# Patient Record
Sex: Female | Born: 1975
Health system: Southern US, Community
[De-identification: ages and names within clinical notes are randomized; demographics above are authoritative.]

## PROBLEM LIST (undated history)

## (undated) DIAGNOSIS — R638 Other symptoms and signs concerning food and fluid intake: Secondary | ICD-10-CM

## (undated) DIAGNOSIS — I5022 Chronic systolic (congestive) heart failure: Principal | ICD-10-CM

## (undated) DIAGNOSIS — F32A Depression, unspecified: Secondary | ICD-10-CM

## (undated) DIAGNOSIS — Z8619 Personal history of other infectious and parasitic diseases: Secondary | ICD-10-CM

## (undated) DIAGNOSIS — G8190 Hemiplegia, unspecified affecting unspecified side: Secondary | ICD-10-CM

## (undated) DIAGNOSIS — K5900 Constipation, unspecified: Secondary | ICD-10-CM

## (undated) DIAGNOSIS — M7989 Other specified soft tissue disorders: Secondary | ICD-10-CM

## (undated) DIAGNOSIS — D649 Anemia, unspecified: Secondary | ICD-10-CM

## (undated) DIAGNOSIS — L309 Dermatitis, unspecified: Secondary | ICD-10-CM

## (undated) DIAGNOSIS — E559 Vitamin D deficiency, unspecified: Secondary | ICD-10-CM

## (undated) DIAGNOSIS — K219 Gastro-esophageal reflux disease without esophagitis: Secondary | ICD-10-CM

## (undated) DIAGNOSIS — I517 Cardiomegaly: Secondary | ICD-10-CM

## (undated) DIAGNOSIS — G43909 Migraine, unspecified, not intractable, without status migrainosus: Secondary | ICD-10-CM

## (undated) DIAGNOSIS — IMO0002 Reserved for concepts with insufficient information to code with codable children: Secondary | ICD-10-CM

## (undated) DIAGNOSIS — I639 Cerebral infarction, unspecified: Secondary | ICD-10-CM

## (undated) DIAGNOSIS — Q211 Atrial septal defect: Secondary | ICD-10-CM

## (undated) DIAGNOSIS — Z8673 Personal history of transient ischemic attack (TIA), and cerebral infarction without residual deficits: Secondary | ICD-10-CM

## (undated) HISTORY — DX: Other symptoms and signs concerning food and fluid intake: R63.8

## (undated) HISTORY — DX: Hemiplegia, unspecified affecting unspecified side: G81.90

## (undated) HISTORY — DX: Personal history of transient ischemic attack (TIA), and cerebral infarction without residual deficits: Z86.73

## (undated) HISTORY — DX: Other specified soft tissue disorders: M79.89

## (undated) HISTORY — DX: Dermatitis, unspecified: L30.9

## (undated) HISTORY — DX: Atrial septal defect: Q21.1

## (undated) HISTORY — DX: Reserved for concepts with insufficient information to code with codable children: IMO0002

## (undated) HISTORY — DX: Chronic systolic (congestive) heart failure: I50.22

## (undated) HISTORY — DX: Cerebral infarction, unspecified: I63.9

## (undated) HISTORY — DX: Personal history of other infectious and parasitic diseases: Z86.19

## (undated) HISTORY — DX: Constipation, unspecified: K59.00

---

## 1997-10-05 ENCOUNTER — Inpatient Hospital Stay (HOSPITAL_COMMUNITY): Admission: AD | Admit: 1997-10-05 | Discharge: 1997-10-05 | Payer: Self-pay | Admitting: *Deleted

## 1997-12-23 ENCOUNTER — Inpatient Hospital Stay (HOSPITAL_COMMUNITY): Admission: AD | Admit: 1997-12-23 | Discharge: 1997-12-23 | Payer: Self-pay | Admitting: *Deleted

## 1998-03-10 ENCOUNTER — Inpatient Hospital Stay (HOSPITAL_COMMUNITY): Admission: AD | Admit: 1998-03-10 | Discharge: 1998-03-10 | Payer: Self-pay | Admitting: *Deleted

## 1998-05-25 ENCOUNTER — Inpatient Hospital Stay (HOSPITAL_COMMUNITY): Admission: AD | Admit: 1998-05-25 | Discharge: 1998-05-25 | Payer: Self-pay | Admitting: *Deleted

## 1998-08-19 ENCOUNTER — Inpatient Hospital Stay (HOSPITAL_COMMUNITY): Admission: AD | Admit: 1998-08-19 | Discharge: 1998-08-19 | Payer: Self-pay | Admitting: *Deleted

## 1998-11-09 ENCOUNTER — Inpatient Hospital Stay (HOSPITAL_COMMUNITY): Admission: AD | Admit: 1998-11-09 | Discharge: 1998-11-09 | Payer: Self-pay | Admitting: *Deleted

## 1999-07-14 ENCOUNTER — Inpatient Hospital Stay (HOSPITAL_COMMUNITY): Admission: AD | Admit: 1999-07-14 | Discharge: 1999-07-14 | Payer: Self-pay | Admitting: *Deleted

## 1999-10-05 ENCOUNTER — Inpatient Hospital Stay (HOSPITAL_COMMUNITY): Admission: AD | Admit: 1999-10-05 | Discharge: 1999-10-05 | Payer: Self-pay | Admitting: *Deleted

## 1999-12-25 ENCOUNTER — Emergency Department (HOSPITAL_COMMUNITY): Admission: EM | Admit: 1999-12-25 | Discharge: 1999-12-25 | Payer: Self-pay | Admitting: Emergency Medicine

## 1999-12-26 ENCOUNTER — Encounter: Payer: Self-pay | Admitting: Emergency Medicine

## 2001-04-04 ENCOUNTER — Other Ambulatory Visit: Admission: RE | Admit: 2001-04-04 | Discharge: 2001-04-04 | Payer: Self-pay | Admitting: Family Medicine

## 2001-04-29 ENCOUNTER — Emergency Department (HOSPITAL_COMMUNITY): Admission: EM | Admit: 2001-04-29 | Discharge: 2001-04-29 | Payer: Self-pay | Admitting: Emergency Medicine

## 2001-07-10 ENCOUNTER — Encounter: Payer: Self-pay | Admitting: Gastroenterology

## 2002-05-15 HISTORY — PX: COLON SURGERY: SHX602

## 2002-06-17 ENCOUNTER — Encounter: Payer: Self-pay | Admitting: Internal Medicine

## 2002-06-17 ENCOUNTER — Inpatient Hospital Stay (HOSPITAL_COMMUNITY): Admission: EM | Admit: 2002-06-17 | Discharge: 2002-06-21 | Payer: Self-pay | Admitting: Emergency Medicine

## 2002-06-18 ENCOUNTER — Encounter: Payer: Self-pay | Admitting: Gastroenterology

## 2002-06-19 ENCOUNTER — Encounter: Payer: Self-pay | Admitting: Surgery

## 2002-06-20 ENCOUNTER — Encounter (INDEPENDENT_AMBULATORY_CARE_PROVIDER_SITE_OTHER): Payer: Self-pay | Admitting: Specialist

## 2002-06-20 ENCOUNTER — Encounter: Payer: Self-pay | Admitting: Gastroenterology

## 2003-06-25 ENCOUNTER — Other Ambulatory Visit: Admission: RE | Admit: 2003-06-25 | Discharge: 2003-06-25 | Payer: Self-pay | Admitting: Family Medicine

## 2004-10-05 ENCOUNTER — Ambulatory Visit: Payer: Self-pay | Admitting: Gastroenterology

## 2004-10-21 ENCOUNTER — Ambulatory Visit: Payer: Self-pay | Admitting: Gastroenterology

## 2004-10-24 ENCOUNTER — Other Ambulatory Visit: Admission: RE | Admit: 2004-10-24 | Discharge: 2004-10-24 | Payer: Self-pay | Admitting: Obstetrics and Gynecology

## 2004-10-28 ENCOUNTER — Ambulatory Visit (HOSPITAL_COMMUNITY): Admission: RE | Admit: 2004-10-28 | Discharge: 2004-10-28 | Payer: Self-pay | Admitting: Gastroenterology

## 2004-12-13 ENCOUNTER — Ambulatory Visit: Payer: Self-pay | Admitting: Gastroenterology

## 2005-11-09 ENCOUNTER — Ambulatory Visit: Payer: Self-pay | Admitting: Gastroenterology

## 2005-11-28 ENCOUNTER — Emergency Department (HOSPITAL_COMMUNITY): Admission: EM | Admit: 2005-11-28 | Discharge: 2005-11-28 | Payer: Self-pay | Admitting: Emergency Medicine

## 2007-04-03 ENCOUNTER — Emergency Department (HOSPITAL_COMMUNITY): Admission: EM | Admit: 2007-04-03 | Discharge: 2007-04-03 | Payer: Self-pay | Admitting: Family Medicine

## 2007-08-28 ENCOUNTER — Emergency Department (HOSPITAL_COMMUNITY): Admission: EM | Admit: 2007-08-28 | Discharge: 2007-08-28 | Payer: Self-pay | Admitting: Family Medicine

## 2007-09-04 ENCOUNTER — Encounter: Admission: RE | Admit: 2007-09-04 | Discharge: 2007-09-04 | Payer: Self-pay | Admitting: Gastroenterology

## 2007-12-14 ENCOUNTER — Emergency Department (HOSPITAL_COMMUNITY): Admission: EM | Admit: 2007-12-14 | Discharge: 2007-12-14 | Payer: Self-pay | Admitting: Family Medicine

## 2008-03-09 DIAGNOSIS — K5900 Constipation, unspecified: Secondary | ICD-10-CM | POA: Insufficient documentation

## 2008-03-09 DIAGNOSIS — K589 Irritable bowel syndrome without diarrhea: Secondary | ICD-10-CM | POA: Insufficient documentation

## 2008-03-10 ENCOUNTER — Ambulatory Visit: Payer: Self-pay | Admitting: Gastroenterology

## 2008-03-25 ENCOUNTER — Telehealth: Payer: Self-pay | Admitting: Gastroenterology

## 2008-12-17 DIAGNOSIS — R638 Other symptoms and signs concerning food and fluid intake: Secondary | ICD-10-CM

## 2008-12-17 HISTORY — DX: Other symptoms and signs concerning food and fluid intake: R63.8

## 2009-12-26 ENCOUNTER — Emergency Department (HOSPITAL_COMMUNITY): Admission: EM | Admit: 2009-12-26 | Discharge: 2009-12-26 | Payer: Self-pay | Admitting: Emergency Medicine

## 2010-05-20 ENCOUNTER — Emergency Department (HOSPITAL_COMMUNITY)
Admission: EM | Admit: 2010-05-20 | Discharge: 2010-05-20 | Payer: Self-pay | Source: Home / Self Care | Admitting: Emergency Medicine

## 2010-07-29 LAB — URINALYSIS, ROUTINE W REFLEX MICROSCOPIC
Glucose, UA: NEGATIVE mg/dL
Ketones, ur: 15 mg/dL — AB
Nitrite: NEGATIVE
Protein, ur: 100 mg/dL — AB
Specific Gravity, Urine: 1.03 (ref 1.005–1.030)
Urobilinogen, UA: 0.2 mg/dL (ref 0.0–1.0)
pH: 5.5 (ref 5.0–8.0)

## 2010-07-29 LAB — URINE MICROSCOPIC-ADD ON

## 2010-07-29 LAB — POCT I-STAT, CHEM 8
BUN: 9 mg/dL (ref 6–23)
Chloride: 111 mEq/L (ref 96–112)
Creatinine, Ser: 0.7 mg/dL (ref 0.4–1.2)
Glucose, Bld: 97 mg/dL (ref 70–99)
HCT: 41 % (ref 36.0–46.0)
Potassium: 4.2 mEq/L (ref 3.5–5.1)

## 2010-07-29 LAB — PREGNANCY, URINE: Preg Test, Ur: NEGATIVE

## 2010-09-30 NOTE — Op Note (Signed)
NAME:  Rebecca Russo, Rebecca Russo                          ACCOUNT NO.:  [id number]   MEDICAL RECORD NO.:  [id number]                   PATIENT TYPE:  INP   LOCATION:  5713                                 FACILITY:  [facility]   PHYSICIAN:  Ollen Gross. Vernell Morgans, M.D.              DATE OF BIRTH:  07/04/1975   DATE OF PROCEDURE:  DATE OF DISCHARGE:  06/21/2002                                 OPERATIVE REPORT   PREOPERATIVE DIAGNOSIS:  Cholelithiasis.   POSTOPERATIVE DIAGNOSIS:  Cholelithiasis.   PROCEDURE:  Laparoscopic cholecystectomy.   SURGEON:  Ollen Gross. Carolynne Edouard, M.D.   ASSISTANT:  Jimmye Norman, M.D.   ANESTHESIA:  General endotracheal.   DESCRIPTION OF PROCEDURE:  After informed consent was obtained, the patient  was brought to the operating room and placed in the supine position on the  operating table.  After adequate induction of general anesthesia, the  patient's abdomen was prepped with Betadine and draped in the usual sterile  manner.  The area below the umbilicus was infiltrated with 0.25% Marcaine.  A small incision was made with the 15 blade knife.  This was incision was  carried down through the subcutaneous tissue bluntly using the Kelly clamp  and the [employer] retractors until the linea of alba was identified.  The  linea of alba was incised with a 15 blade knife, and each side was grasped  with Kocher clamps and elevated anteriorly.  The preperitoneal space was  probe bluntly with the hemostat until the peritoneum was opened and access  was gained to the abdominal cavity.  A 0 Vicryl pursestring stitch was  placed in the fascia surrounding this opening.  The Hasson cannula was  placed through the opening and anchored in place through the previously  placed Vicryl pursestring stitch.  The abdomen was then insufflated with  carbon dioxide without difficulty.  The patient was placed in a head up  position and laparoscope was inserted through the Hasson cannula.  The dome  of the  gallbladder and liver edge were readily identified.  The epigastric  region was then infiltrated with 0.25% Marcaine and a small incision was  made with the 15 blade knife.  A 10 mm port was placed bluntly through this  incision into the abdominal cavity under direct vision.  Next, sites were  chosen laterally on the right side of the abdomen for the placement of the 5  mm ports.  Each of these areas was infiltrated with 0.25% Marcaine.  Small  stab incisions were made with the 15 blade knife and the 5 mm ports were  placed bluntly through these incisions into the abdominal cavity under  direct vision.  A blunt grasper was placed through the lateral most 5 mm  port and used to grasp the dome of the gallbladder and elevate it anteriorly  and superiorly.  Another blunt grasper was placed through the other 5 mm  port and used to retract the body and neck of the gallbladder.  The  dissector was placed through the epigastric port and using the  electrocautery the peritoneal reflection at the gallbladder neck was opened  and blunt dissection was then carried out in this area until the gallbladder  neck, cystic neck junction was readily identified.  Once this was  accomplished, a clip was placed on the gallbladder neck.  A small ductotomy  was made with the laparoscopic scissors.  A 14 gauge angiocatheter was then  placed percutaneously through the anterior abdominal wall under direct  vision.  A Reddick cholangiogram catheter was then placed through the NG  catheter and flushed.  The catheter was then placed within the cystic duct  and anchored in place with the clip.  The cholangiogram was obtained that  showed good emptying of the dye into the duodenum without any filling  defects in the bile ducts and a good link on the cystic duct.  The anchor  clip and catheters were then removed from the patient.  Three clips were  placed on the cystic duct, and the duct was divided between the two sets of   clips.  The cystic artery was identified and again dissected  circumferentially in a blunt manner until a good window was created.  Two  clips were placed proximally and one distally on the artery, and the artery  was divided between the two.  Next, a laparoscopic hook cautery device was  used to separate the gallbladder from the liver bed.  Prior to completely  detaching the gallbladder from the liver bed, the liver bed was inspected  and several small bleeding points were coagulated with the electrocautery  until the liver bed was hemostatic.  The gallbladder was then detached the  rest of the way from the liver bed without difficulty.  The laparoscope was  then moved to the upper midline port and the gallbladder grasper was placed  through the Hasson cannula and used to grasp the neck of the gallbladder.  The gallbladder was then removed with the Hasson cannula through the  infraumbilical port without difficulty.  The fascial defect was then closed  with the previously placed Vicryl pursestring stitch.  The liver bed was  inspected again and found to be hemostatic.  The abdomen was then irrigated  with copious amounts of saline to the effluent was clear.  The rest of the  ports were removed under direct vision and were all hemostatic.  The gas was  allowed to escape, and the skin incisions were all closed with interrupted 4-  0 Monocryl subcuticular stitches.  Benzoin and Steri-Strips were applied.  The patient tolerated the procedure well.  At the end of the case, all  needle, sponge and instrument counts were correct.  The patient was awakened  and taken to the recovery room in stable condition.                                                Ollen Gross. Vernell Morgans, M.D.    PST/MEDQ  D:  06/22/2002  T:  06/22/2002  Job:  [id number]

## 2010-09-30 NOTE — Consult Note (Signed)
   NAME:  Rebecca Russo, Rebecca Russo                          ACCOUNT NO.:  [id number]   MEDICAL RECORD NO.:  [id number]                   PATIENT TYPE:  INP   LOCATION:  1829                                 FACILITY:  [facility]   PHYSICIAN:  Thornton Park. Daphine Deutscher, M.D.             DATE OF BIRTH:  1976/02/26   DATE OF CONSULTATION:  06/17/2002  DATE OF DISCHARGE:                                   CONSULTATION   ADMISSION DIAGNOSIS:  Gallstones, probable choledocholithiasis.   HISTORY OF PRESENT ILLNESS:  This is a 35 year old, slightly obese, black  female with a history of chronic abdominal pain.  She has had C-section  several months ago and since then has had some bouts of abdominal pain  similarly, but none has lasted quite like this.  Her pain started yesterday  and has lasted through today.  She is seen in the ED, where her bilirubin  was noted to be over 3, and she had elevated transaminases.  Total bilirubin  was 3.8.   I reviewed her ultrasound with Dr. Jena Gauss and found her to have multiple  moderate-sized gallstones.  Her common bile duct was noted to be elevated at  1.1 cm and it appeared to be consistent with choledocholithiasis.  She did  have any evidence of pericholecystic fluid.   ALLERGIES:  She has no known allergies.   MEDICATIONS:  Depo-Provera every three months.   PHYSICAL EXAMINATION:  GENERAL:  Somewhat moderately obese female who is  afebrile.  VITAL SIGNS:  Stable.  HEENT:  No obvious scleral icterus at this time.  ABDOMEN:  Some mild tenderness in the right upper quadrant without rebound  or guarding.  LUNGS:  Clear.  HEART:  Sinus rhythm.   IMPRESSION:  Gallstones with elevated and dilated common bile duct.   PLAN:  ERCP on June 18, 2002, with probable laparoscopic cholecystectomy  to follow.                                                   Thornton Park Daphine Deutscher, M.D.    MBM/MEDQ  D:  06/17/2002  T:  06/17/2002  Job:  [id number]

## 2010-09-30 NOTE — Discharge Summary (Signed)
   NAME:  ANIQUA, BRIERE                          ACCOUNT NO.:  [id number]   MEDICAL RECORD NO.:  [id number]                   PATIENT TYPE:  INP   LOCATION:  5713                                 FACILITY:  [facility]   PHYSICIAN:  Ollen Gross. Vernell Morgans, M.D.              DATE OF BIRTH:  20-Jan-1976   DATE OF ADMISSION:  06/17/2002  DATE OF DISCHARGE:  06/21/2002                                 DISCHARGE SUMMARY   HISTORY OF PRESENT ILLNESS:  The patient is a 35 year old white female who  was admitted to the hospital with symptomatic gallstones.   HOSPITAL COURSE:  She underwent an ERCP on June 18, 2002. She was  improved on June 19, 2002, and on  June 20, 2002, she was to the OR  for a laparoscopic cholecystectomy.   She tolerated this well, and  the following day her liver function tests  were continuing to improve. She was tolerating a diet and she was ready for  discharge to home.   FINAL DIAGNOSES:  Cholelithiasis.   CONDITION ON DISCHARGE:  Stable.   DISCHARGE INSTRUCTIONS:  Diet as tolerated.   DISCHARGE MEDICATIONS:  She was to resume her home medications and she was  given a prescription for Vicodin for pain.   DISPOSITION:  She was discharged to home.                                               Ollen Gross. Vernell Morgans, M.D.    PST/MEDQ  D:  08/19/2002  T:  08/20/2002  Job:  [id number]

## 2010-09-30 NOTE — H&P (Signed)
NAME:  Rebecca Russo, Rebecca Russo                          ACCOUNT NO.:  [id number]   MEDICAL RECORD NO.:  [id number]                   PATIENT TYPE:  INP   LOCATION:  1829                                 FACILITY:  [facility]   PHYSICIAN:  Barbette Hair. Arlyce Dice, M.D. [facility]          DATE OF BIRTH:  07-14-1975   DATE OF ADMISSION:  06/17/2002  DATE OF DISCHARGE:                                HISTORY & PHYSICAL   CHIEF COMPLAINT:  Right upper quadrant pain, nausea and vomiting.   HISTORY OF PRESENT ILLNESS:  The patient is a 35 year old African-American  female who has a history of chronic intermittent diffuse abdominal  pain.  She underwent colonoscopy last year by Dr. Eloise Harman.  Do not have the  results of the colonoscopy, but she does carry a diagnosis of irritable  bowel syndrome, constipation prone.  She was seen in the ER today by the  emergency room staff because of onset of new right upper quadrant pain  associated with nausea and vomiting yesterday afternoon.  This has persisted  through today.  She came to the emergency room.  Labs showed elevated total  bilirubin, transaminases and alk phos.  Ultrasound shows extensive  gallstones and dilated common bile duct.  The patient has not had prior  abdominal  ultrasound.   PAST MEDICAL HISTORY:  Childbirth x2.  Irritable bowel syndrome.   SOCIAL HISTORY:  The patient works as a Government social research officer.  She has  two children.  She is unmarried.  Does not smoke.  Does not consume  alcoholic beverages.   FAMILY HISTORY:  One of her aunts has had open cholecystectomy.   CURRENT MEDICATIONS:  Depo-Provera shots every three months.   ALLERGIES:  None.   REVIEW OF SYSTEMS:  NEUROLOGIC:  No headaches, no paresthesias.  No syncope.  PULMONARY:  No shortness of breath, no cough, no pleuritic pain.  CARDIOVASCULAR:  No palpitations, no extremity edema.  GENERAL:  Appetite  has been decreased in the last six weeks with early satiety.  However, no  nausea  until last night.  She denies weight loss.  No fevers, no chills. GI:  As above.  GU:  No dysuria.  No hematuria.  No bladder incontinence.  MUSCULOSKELETAL:  No back pain.  No joint effusions.  All other systems  reviewed and were negative.   PHYSICAL EXAMINATION:  GENERAL:  The patient is a morbidly obese African-  American female.  Affect is flat.  VITAL SIGNS:  Blood pressure 114/56, pulse 74, respirations 20, temperature  99.2.  Weight not yet obtained.  HEENT:  No scleral icterus.  No conjunctival pallor. Oropharynx is moist and  clear with dentition in fair  repair.  CHEST:  Clear to auscultation and percussion bilaterally.  NECK:  There is no masses, no jugular venous distention, no thyromegaly.  COR:  Regular rate and rhythm.  No murmurs, rubs or gallops.  ABDOMEN:  Obese.  She has mild tenderness in the right upper quadrant not  associated with guarding or rebound.  Bowel sounds are active.  No  hepatosplenomegaly.  No masses.  RECTAL:  Deferred.  GU:  Deferred.  BREASTS:  Deferred.  NEUROLOGIC:  She is alert and oriented x3.  No tremors.  PSYCHIATRIC:  Affect is flat and she is a bit tearful.  EXTREMITIES:  No cyanosis, clubbing or edema.  DERMATOLOGIC:  A tattoo is noted at the base of the right neck.   LABORATORY DATA:  Total bilirubin 3.8, alkaline phosphatase 143, AST 401,  ALT 409.  Albumin is 3.7.  Sodium 142, potassium 4.0.  BUN is 9, creatinine  0.9.  Glucose 110.  White blood cell count 9.7.  Hemoglobin 12.4, hematocrit  35.9, platelets 384,000 and MCV is 88.  Ultrasound showing extensive  gallstones and dilated common bile duct.  Urinalysis is negative.   ASSESSMENT:  1. Rule out choledocholithiasis in patient  with acute onset right upper     quadrant pain with nausea, vomiting and cholestatic jaundice pattern on     liver function tests.  2. Gallstones.   PLAN:  The patient to be admitted to Dr. Marzetta Board service.  Plan ERCP  tomorrow.  Dr. Daphine Deutscher has been  requested to see the patient for general  surgical evaluation and consideration of laparoscopic cholecystectomy.     Brett Canales, P.A. [facility]                    Robert D. Arlyce Dice, M.D. [facility]    SG/MEDQ  D:  06/17/2002  T:  06/17/2002  Job:  [id number]

## 2011-02-10 LAB — COMPREHENSIVE METABOLIC PANEL
ALT: 30
Alkaline Phosphatase: 78
Chloride: 109
Glucose, Bld: 97
Potassium: 3.7
Sodium: 139
Total Protein: 7.5

## 2011-02-10 LAB — POCT I-STAT, CHEM 8
Creatinine, Ser: 0.8
Glucose, Bld: 90
Hemoglobin: 13.9
Sodium: 139
TCO2: 22

## 2011-02-21 LAB — POCT URINALYSIS DIP (DEVICE)
Protein, ur: 30 — AB
Specific Gravity, Urine: 1.025
pH: 6

## 2011-02-21 LAB — URINE CULTURE

## 2011-02-21 LAB — POCT PREGNANCY, URINE: Operator id: 208841

## 2011-11-24 ENCOUNTER — Ambulatory Visit (INDEPENDENT_AMBULATORY_CARE_PROVIDER_SITE_OTHER): Payer: Private Health Insurance - Indemnity | Admitting: Obstetrics and Gynecology

## 2011-11-24 ENCOUNTER — Encounter: Payer: Self-pay | Admitting: Obstetrics and Gynecology

## 2011-11-24 VITALS — BP 110/80 | Resp 14 | Ht 70.5 in | Wt 346.0 lb

## 2011-11-24 DIAGNOSIS — Z30432 Encounter for removal of intrauterine contraceptive device: Secondary | ICD-10-CM

## 2011-11-24 NOTE — Progress Notes (Signed)
Pt here for IUD removal.  Says she feels pressure and discomfort since insertion but has not had a cycle.  Filed Vitals:   11/24/11 0936  BP: 110/80  Resp: 14   ROS: noncontributory  Pelvic exam:  VULVA: normal appearing vulva with no masses, tenderness or lesions,  VAGINA: normal appearing vagina with normal color and discharge, no lesions, CERVIX: normal appearing cervix without discharge or lesions,  UTERUS: uterus is normal size, shape, consistency and nontender, difficult secondary to habitus ADNEXA: normal adnexa in size, nontender and no masses. IUD removed without difficulty  A/P [facility] options reviewed - rec prog only agents secondary to age and wt - Pt is deciding Next month for AEX

## 2012-02-09 ENCOUNTER — Encounter: Payer: Private Health Insurance - Indemnity | Admitting: Obstetrics and Gynecology

## 2012-06-11 ENCOUNTER — Encounter: Payer: Private Health Insurance - Indemnity | Admitting: Obstetrics and Gynecology

## 2012-06-17 ENCOUNTER — Ambulatory Visit: Payer: Private Health Insurance - Indemnity | Admitting: Obstetrics and Gynecology

## 2012-06-17 ENCOUNTER — Encounter: Payer: Self-pay | Admitting: Obstetrics and Gynecology

## 2012-06-17 VITALS — BP 112/70 | Ht 70.0 in | Wt 345.0 lb

## 2012-06-17 DIAGNOSIS — Z30017 Encounter for initial prescription of implantable subdermal contraceptive: Secondary | ICD-10-CM

## 2012-06-17 DIAGNOSIS — Z3040 Encounter for surveillance of contraceptives, unspecified: Secondary | ICD-10-CM

## 2012-06-17 LAB — POCT URINE PREGNANCY: Preg Test, Ur: NEGATIVE

## 2012-06-17 MED ORDER — ETONOGESTREL 68 MG ~~LOC~~ IMPL: 68.0000 mg | DRUG_IMPLANT | Freq: Once | SUBCUTANEOUS | Status: DC

## 2012-06-17 MED ORDER — ETONOGESTREL 68 MG ~~LOC~~ IMPL
68.0000 mg | DRUG_IMPLANT | Freq: Once | SUBCUTANEOUS | Status: AC
  Administered 2012-06-17: 68 mg via SUBCUTANEOUS

## 2012-06-17 NOTE — Progress Notes (Deleted)
Subjective:     Patient ID: Rebecca Russo, female   DOB: 19-Jun-1975, 37 y.o.   MRN: [id number]  HPI   Review of Systems     Objective:   Physical Exam     Assessment:     ***    Plan:     ***

## 2012-06-17 NOTE — Progress Notes (Deleted)
Subjective:     Patient ID: Rebecca Russo, female   DOB: 03/21/1976, 36 y.o.   MRN: [id number]  HPI   Review of Systems     Objective:   Physical Exam     Assessment:     ***    Plan:     ***      

## 2012-06-17 NOTE — Progress Notes (Addendum)
Patient ID: Rebecca Russo, female   DOB: Jun 22, 1975, 37 y.o.   MRN: [id number] LMP: 05/31/2012 INSERTION DATE: 06/17/2012 REMOVAL DATE: 06/18/2015 INSERTION ARM: Left PALPATED AFTER INSERT: yes IS PT SWITCHING FROM HORMONAL BC: no LOT # [id number] EXP: 08/2014  Reviewed SEs and questions answered  Inserted per protocol in left arm without difficulty  Filed Vitals:   06/17/12 1143  BP: 112/70   A/P rto 1-2wks for f/u

## 2012-06-20 HISTORY — PX: LAPAROSCOPIC CHOLECYSTECTOMY: SUR755

## 2012-06-29 ENCOUNTER — Other Ambulatory Visit: Payer: Self-pay

## 2012-07-01 ENCOUNTER — Encounter: Payer: Private Health Insurance - Indemnity | Admitting: Obstetrics and Gynecology

## 2013-03-20 ENCOUNTER — Other Ambulatory Visit: Payer: Self-pay

## 2014-03-16 ENCOUNTER — Encounter: Payer: Self-pay | Admitting: Obstetrics and Gynecology

## 2014-05-29 ENCOUNTER — Inpatient Hospital Stay (HOSPITAL_COMMUNITY): Payer: Managed Care, Other (non HMO)

## 2014-05-29 ENCOUNTER — Inpatient Hospital Stay (HOSPITAL_COMMUNITY)
Admission: EM | Admit: 2014-05-29 | Discharge: 2014-06-04 | DRG: 023 | Disposition: A | Discharge status: Home Health | Payer: Managed Care, Other (non HMO) | Attending: Neurology | Admitting: Neurology

## 2014-05-29 ENCOUNTER — Encounter (HOSPITAL_COMMUNITY)
Admission: EM | Disposition: A | Discharge status: Home Health | Payer: Self-pay | Source: Home / Self Care | Attending: Neurology

## 2014-05-29 ENCOUNTER — Ambulatory Visit (HOSPITAL_COMMUNITY): Payer: Managed Care, Other (non HMO)

## 2014-05-29 ENCOUNTER — Encounter (HOSPITAL_COMMUNITY): Payer: Self-pay | Admitting: Certified Registered"

## 2014-05-29 ENCOUNTER — Emergency Department (HOSPITAL_COMMUNITY): Payer: Managed Care, Other (non HMO) | Admitting: Anesthesiology

## 2014-05-29 ENCOUNTER — Emergency Department (HOSPITAL_COMMUNITY): Payer: Managed Care, Other (non HMO)

## 2014-05-29 DIAGNOSIS — I63412 Cerebral infarction due to embolism of left middle cerebral artery: Principal | ICD-10-CM | POA: Diagnosis present

## 2014-05-29 DIAGNOSIS — R1310 Dysphagia, unspecified: Secondary | ICD-10-CM | POA: Diagnosis present

## 2014-05-29 DIAGNOSIS — R4701 Aphasia: Secondary | ICD-10-CM | POA: Diagnosis present

## 2014-05-29 DIAGNOSIS — G8191 Hemiplegia, unspecified affecting right dominant side: Secondary | ICD-10-CM | POA: Diagnosis present

## 2014-05-29 DIAGNOSIS — Z823 Family history of stroke: Secondary | ICD-10-CM

## 2014-05-29 DIAGNOSIS — R197 Diarrhea, unspecified: Secondary | ICD-10-CM | POA: Diagnosis present

## 2014-05-29 DIAGNOSIS — IMO0002 Reserved for concepts with insufficient information to code with codable children: Secondary | ICD-10-CM

## 2014-05-29 DIAGNOSIS — E785 Hyperlipidemia, unspecified: Secondary | ICD-10-CM | POA: Diagnosis present

## 2014-05-29 DIAGNOSIS — Z8249 Family history of ischemic heart disease and other diseases of the circulatory system: Secondary | ICD-10-CM | POA: Diagnosis not present

## 2014-05-29 DIAGNOSIS — E669 Obesity, unspecified: Secondary | ICD-10-CM | POA: Diagnosis present

## 2014-05-29 DIAGNOSIS — I10 Essential (primary) hypertension: Secondary | ICD-10-CM | POA: Diagnosis present

## 2014-05-29 DIAGNOSIS — W06XXXA Fall from bed, initial encounter: Secondary | ICD-10-CM | POA: Diagnosis present

## 2014-05-29 DIAGNOSIS — J9601 Acute respiratory failure with hypoxia: Secondary | ICD-10-CM | POA: Diagnosis not present

## 2014-05-29 DIAGNOSIS — I639 Cerebral infarction, unspecified: Secondary | ICD-10-CM | POA: Diagnosis present

## 2014-05-29 DIAGNOSIS — I6782 Cerebral ischemia: Secondary | ICD-10-CM

## 2014-05-29 DIAGNOSIS — D72829 Elevated white blood cell count, unspecified: Secondary | ICD-10-CM | POA: Diagnosis not present

## 2014-05-29 DIAGNOSIS — I6522 Occlusion and stenosis of left carotid artery: Secondary | ICD-10-CM | POA: Diagnosis present

## 2014-05-29 DIAGNOSIS — Z6841 Body Mass Index (BMI) 40.0 and over, adult: Secondary | ICD-10-CM

## 2014-05-29 DIAGNOSIS — G8190 Hemiplegia, unspecified affecting unspecified side: Secondary | ICD-10-CM

## 2014-05-29 DIAGNOSIS — R1314 Dysphagia, pharyngoesophageal phase: Secondary | ICD-10-CM | POA: Diagnosis present

## 2014-05-29 DIAGNOSIS — Z9289 Personal history of other medical treatment: Secondary | ICD-10-CM

## 2014-05-29 DIAGNOSIS — J9600 Acute respiratory failure, unspecified whether with hypoxia or hypercapnia: Secondary | ICD-10-CM

## 2014-05-29 DIAGNOSIS — I253 Aneurysm of heart: Secondary | ICD-10-CM | POA: Diagnosis present

## 2014-05-29 DIAGNOSIS — Z4659 Encounter for fitting and adjustment of other gastrointestinal appliance and device: Secondary | ICD-10-CM

## 2014-05-29 DIAGNOSIS — I69320 Aphasia following cerebral infarction: Secondary | ICD-10-CM

## 2014-05-29 DIAGNOSIS — Q211 Atrial septal defect: Secondary | ICD-10-CM | POA: Diagnosis not present

## 2014-05-29 HISTORY — PX: RADIOLOGY WITH ANESTHESIA: SHX6223

## 2014-05-29 LAB — I-STAT CHEM 8, ED
BUN: 11 mg/dL (ref 6–23)
CREATININE: 0.7 mg/dL (ref 0.50–1.10)
Calcium, Ion: 1.19 mmol/L (ref 1.12–1.23)
Chloride: 107 mEq/L (ref 96–112)
GLUCOSE: 113 mg/dL — AB (ref 70–99)
HCT: 38 % (ref 36.0–46.0)
Hemoglobin: 12.9 g/dL (ref 12.0–15.0)
Potassium: 4 mmol/L (ref 3.5–5.1)
SODIUM: 139 mmol/L (ref 135–145)
TCO2: 19 mmol/L (ref 0–100)

## 2014-05-29 LAB — COMPREHENSIVE METABOLIC PANEL
ALT: 22 U/L (ref 0–35)
ANION GAP: 11 (ref 5–15)
AST: 29 U/L (ref 0–37)
Albumin: 3.5 g/dL (ref 3.5–5.2)
Alkaline Phosphatase: 73 U/L (ref 39–117)
BUN: 10 mg/dL (ref 6–23)
CHLORIDE: 107 meq/L (ref 96–112)
CO2: 21 mmol/L (ref 19–32)
CREATININE: 0.76 mg/dL (ref 0.50–1.10)
Calcium: 9.3 mg/dL (ref 8.4–10.5)
GFR calc Af Amer: >90 mL/min (ref >90)
GFR calc non Af Amer: >90 mL/min (ref >90)
Glucose, Bld: 114 mg/dL — ABNORMAL HIGH (ref 70–99)
Potassium: 4 mmol/L (ref 3.5–5.1)
SODIUM: 139 mmol/L (ref 135–145)
Total Bilirubin: 0.4 mg/dL (ref 0.3–1.2)
Total Protein: 7.2 g/dL (ref 6.0–8.3)

## 2014-05-29 LAB — I-STAT ARTERIAL BLOOD GAS, ED
ACID-BASE DEFICIT: 4 mmol/L — AB (ref 0.0–2.0)
Bicarbonate: 22.2 mEq/L (ref 20.0–24.0)
O2 Saturation: 100 %
PCO2 ART: 43.2 mmHg (ref 35.0–45.0)
PH ART: 7.32 — AB (ref 7.350–7.450)
PO2 ART: 360 mmHg — AB (ref 80.0–100.0)
TCO2: 24 mmol/L (ref 0–100)

## 2014-05-29 LAB — GLUCOSE, CAPILLARY
GLUCOSE-CAPILLARY: 105 mg/dL — AB (ref 70–99)
GLUCOSE-CAPILLARY: 83 mg/dL (ref 70–99)
Glucose-Capillary: 91 mg/dL (ref 70–99)

## 2014-05-29 LAB — URINALYSIS, ROUTINE W REFLEX MICROSCOPIC
Bilirubin Urine: NEGATIVE
Glucose, UA: NEGATIVE mg/dL
HGB URINE DIPSTICK: NEGATIVE
Ketones, ur: NEGATIVE mg/dL
Leukocytes, UA: NEGATIVE
Nitrite: NEGATIVE
Protein, ur: NEGATIVE mg/dL
Specific Gravity, Urine: 1.029 (ref 1.005–1.030)
UROBILINOGEN UA: 0.2 mg/dL (ref 0.0–1.0)
pH: 5 (ref 5.0–8.0)

## 2014-05-29 LAB — CBC
HCT: 35.6 % — ABNORMAL LOW (ref 36.0–46.0)
HEMOGLOBIN: 11.9 g/dL — AB (ref 12.0–15.0)
MCH: 29.6 pg (ref 26.0–34.0)
MCHC: 33.4 g/dL (ref 30.0–36.0)
MCV: 88.6 fL (ref 78.0–100.0)
PLATELETS: 385 10*3/uL (ref 150–400)
RBC: 4.02 MIL/uL (ref 3.87–5.11)
RDW: 13.8 % (ref 11.5–15.5)
WBC: 10.6 10*3/uL — AB (ref 4.0–10.5)

## 2014-05-29 LAB — ETHANOL

## 2014-05-29 LAB — ANTITHROMBIN III: AntiThromb III Func: 105 % (ref 75–120)

## 2014-05-29 LAB — POCT ACTIVATED CLOTTING TIME: ACTIVATED CLOTTING TIME: 146 s

## 2014-05-29 LAB — RAPID URINE DRUG SCREEN, HOSP PERFORMED
AMPHETAMINES: NOT DETECTED
Barbiturates: NOT DETECTED
Benzodiazepines: NOT DETECTED
Cocaine: NOT DETECTED
OPIATES: NOT DETECTED
TETRAHYDROCANNABINOL: NOT DETECTED

## 2014-05-29 LAB — APTT: aPTT: 29 seconds (ref 24–37)

## 2014-05-29 LAB — PROTIME-INR
INR: 1.02 (ref 0.00–1.49)
Prothrombin Time: 13.5 seconds (ref 11.6–15.2)

## 2014-05-29 LAB — DIFFERENTIAL
BASOS ABS: 0 10*3/uL (ref 0.0–0.1)
Basophils Relative: 0 % (ref 0–1)
Eosinophils Absolute: 0.1 10*3/uL (ref 0.0–0.7)
Eosinophils Relative: 1 % (ref 0–5)
LYMPHS PCT: 39 % (ref 12–46)
Lymphs Abs: 4.1 10*3/uL — ABNORMAL HIGH (ref 0.7–4.0)
Monocytes Absolute: 0.5 10*3/uL (ref 0.1–1.0)
Monocytes Relative: 5 % (ref 3–12)
NEUTROS ABS: 5.7 10*3/uL (ref 1.7–7.7)
NEUTROS PCT: 55 % (ref 43–77)

## 2014-05-29 LAB — I-STAT TROPONIN, ED: Troponin i, poc: 0 ng/mL (ref 0.00–0.08)

## 2014-05-29 LAB — MRSA PCR SCREENING: MRSA by PCR: NEGATIVE

## 2014-05-29 SURGERY — RADIOLOGY WITH ANESTHESIA
Anesthesia: General

## 2014-05-29 MED ORDER — SENNOSIDES-DOCUSATE SODIUM 8.6-50 MG PO TABS: 1.0000 | ORAL_TABLET | Freq: Every evening | ORAL | Status: DC | PRN

## 2014-05-29 MED ORDER — ENOXAPARIN SODIUM 40 MG/0.4ML ~~LOC~~ SOLN: 40.0000 mg | SUBCUTANEOUS | Status: DC

## 2014-05-29 MED ORDER — HEPARIN SODIUM (PORCINE) 1000 UNIT/ML IJ SOLN
INTRAMUSCULAR | Status: DC | PRN
  Administered 2014-05-29: 3000 [IU] via INTRAVENOUS

## 2014-05-29 MED ORDER — STROKE: EARLY STAGES OF RECOVERY BOOK
Freq: Once | Status: DC
  Filled 2014-05-29: qty 1

## 2014-05-29 MED ORDER — PANTOPRAZOLE SODIUM 40 MG IV SOLR
40.0000 mg | INTRAVENOUS | Status: DC
  Administered 2014-05-29 – 2014-05-30 (×2): 40 mg via INTRAVENOUS
  Filled 2014-05-29 (×3): qty 40

## 2014-05-29 MED ORDER — FENTANYL CITRATE 0.05 MG/ML IJ SOLN
INTRAMUSCULAR | Status: DC | PRN
  Administered 2014-05-29: 100 ug via INTRAVENOUS
  Administered 2014-05-29 (×3): 50 ug via INTRAVENOUS

## 2014-05-29 MED ORDER — SUCCINYLCHOLINE CHLORIDE 20 MG/ML IJ SOLN
INTRAMUSCULAR | Status: DC | PRN
  Administered 2014-05-29: 140 mg via INTRAVENOUS

## 2014-05-29 MED ORDER — CEFAZOLIN SODIUM-DEXTROSE 2-3 GM-% IV SOLR
2.0000 g | Freq: Three times a day (TID) | INTRAVENOUS | Status: DC
  Administered 2014-05-29 – 2014-05-31 (×6): 2 g via INTRAVENOUS
  Filled 2014-05-29 (×10): qty 50

## 2014-05-29 MED ORDER — NITROGLYCERIN 1 MG/10 ML FOR IR/CATH LAB
INTRA_ARTERIAL | Status: AC
  Filled 2014-05-29: qty 10

## 2014-05-29 MED ORDER — DEXTROSE 5 % IV SOLN
10.0000 mg | INTRAVENOUS | Status: DC | PRN
  Administered 2014-05-29: 20 ug/min via INTRAVENOUS

## 2014-05-29 MED ORDER — HEPARIN SODIUM (PORCINE) 1000 UNIT/ML IJ SOLN
INTRAMUSCULAR | Status: AC
  Filled 2014-05-29: qty 1

## 2014-05-29 MED ORDER — FENTANYL CITRATE 0.05 MG/ML IJ SOLN
100.0000 ug | INTRAMUSCULAR | Status: DC | PRN
  Administered 2014-05-29 – 2014-05-30 (×4): 100 ug via INTRAVENOUS
  Filled 2014-05-29 (×4): qty 2

## 2014-05-29 MED ORDER — SODIUM CHLORIDE 0.9 % IV SOLN
INTRAVENOUS | Status: DC | PRN
  Administered 2014-05-29: 10:00:00 via INTRAVENOUS

## 2014-05-29 MED ORDER — ACETAMINOPHEN 500 MG PO TABS
1000.0000 mg | ORAL_TABLET | Freq: Four times a day (QID) | ORAL | Status: DC | PRN
  Administered 2014-05-31 (×2): 1000 mg via ORAL
  Filled 2014-05-29 (×2): qty 2

## 2014-05-29 MED ORDER — ASPIRIN 325 MG PO TABS
ORAL_TABLET | ORAL | Status: AC
  Administered 2014-05-29: 650 mg
  Filled 2014-05-29: qty 2

## 2014-05-29 MED ORDER — STROKE: EARLY STAGES OF RECOVERY BOOK
Freq: Once | Status: AC
  Administered 2014-05-29: 16:00:00
  Filled 2014-05-29: qty 1

## 2014-05-29 MED ORDER — SENNOSIDES-DOCUSATE SODIUM 8.6-50 MG PO TABS
1.0000 | ORAL_TABLET | Freq: Every evening | ORAL | Status: DC | PRN
Start: 2014-05-29 — End: 2014-06-04
  Filled 2014-05-29: qty 1

## 2014-05-29 MED ORDER — PROPOFOL 10 MG/ML IV BOLUS
INTRAVENOUS | Status: DC | PRN
  Administered 2014-05-29: 180 mg via INTRAVENOUS

## 2014-05-29 MED ORDER — LIDOCAINE HCL (CARDIAC) 20 MG/ML IV SOLN
INTRAVENOUS | Status: DC | PRN
  Administered 2014-05-29: 75 mg via INTRAVENOUS

## 2014-05-29 MED ORDER — ROCURONIUM BROMIDE 100 MG/10ML IV SOLN
INTRAVENOUS | Status: DC | PRN
  Administered 2014-05-29: 50 mg via INTRAVENOUS

## 2014-05-29 MED ORDER — CLOPIDOGREL BISULFATE 75 MG PO TABS
ORAL_TABLET | ORAL | Status: AC
  Administered 2014-05-29: 300 mg
  Filled 2014-05-29: qty 4

## 2014-05-29 MED ORDER — LIDOCAINE HCL 1 % IJ SOLN
INTRAMUSCULAR | Status: AC
  Filled 2014-05-29: qty 20

## 2014-05-29 MED ORDER — ENOXAPARIN SODIUM 40 MG/0.4ML ~~LOC~~ SOLN
40.0000 mg | SUBCUTANEOUS | Status: DC
  Filled 2014-05-29 (×2): qty 0.4

## 2014-05-29 MED ORDER — IOHEXOL 350 MG/ML SOLN
50.0000 mL | Freq: Once | INTRAVENOUS | Status: AC | PRN
  Administered 2014-05-29: 40 mL via INTRAVENOUS

## 2014-05-29 MED ORDER — PROPOFOL 10 MG/ML IV EMUL
5.0000 ug/kg/min | INTRAVENOUS | Status: DC
  Administered 2014-05-29: 20 ug/kg/min via INTRAVENOUS
  Administered 2014-05-29: 50 ug/kg/min via INTRAVENOUS
  Administered 2014-05-30: 40 ug/kg/min via INTRAVENOUS
  Administered 2014-05-30: 30 ug/kg/min via INTRAVENOUS
  Filled 2014-05-29 (×4): qty 100

## 2014-05-29 MED ORDER — CETYLPYRIDINIUM CHLORIDE 0.05 % MT LIQD
7.0000 mL | Freq: Four times a day (QID) | OROMUCOSAL | Status: DC
  Administered 2014-05-29 – 2014-05-30 (×3): 7 mL via OROMUCOSAL

## 2014-05-29 MED ORDER — MIDAZOLAM HCL 2 MG/2ML IJ SOLN
INTRAMUSCULAR | Status: DC | PRN
  Administered 2014-05-29: 2 mg via INTRAVENOUS

## 2014-05-29 MED ORDER — LABETALOL HCL 5 MG/ML IV SOLN
INTRAVENOUS | Status: DC | PRN
  Administered 2014-05-29 (×2): 10 mg via INTRAVENOUS

## 2014-05-29 MED ORDER — PROPOFOL 10 MG/ML IV EMUL
5.0000 ug/kg/min | INTRAVENOUS | Status: DC
  Administered 2014-05-29: 10 ug/kg/min via INTRAVENOUS

## 2014-05-29 MED ORDER — CLOPIDOGREL BISULFATE 300 MG PO TABS: 300.0000 mg | ORAL_TABLET | Freq: Once | ORAL | Status: AC

## 2014-05-29 MED ORDER — EPTIFIBATIDE 2 MG/ML IV SOLN
20.0000 mg | Freq: Once | INTRAVENOUS | Status: AC
  Administered 2014-05-29: 4 mg via INTRAVENOUS
  Administered 2014-05-29 (×3): 2 mg via INTRAVENOUS

## 2014-05-29 MED ORDER — ASPIRIN 325 MG PO TABS: 650.0000 mg | ORAL_TABLET | Freq: Every day | ORAL | Status: DC

## 2014-05-29 MED ORDER — CEFAZOLIN SODIUM-DEXTROSE 2-3 GM-% IV SOLR
INTRAVENOUS | Status: AC
  Administered 2014-05-29: 2 g via INTRAVENOUS
  Filled 2014-05-29: qty 50

## 2014-05-29 MED ORDER — PROPOFOL 10 MG/ML IV EMUL
INTRAVENOUS | Status: AC
  Filled 2014-05-29: qty 100

## 2014-05-29 MED ORDER — VECURONIUM BROMIDE 10 MG IV SOLR
INTRAVENOUS | Status: DC | PRN
  Administered 2014-05-29: 2 mg via INTRAVENOUS
  Administered 2014-05-29: 1 mg via INTRAVENOUS
  Administered 2014-05-29: 2 mg via INTRAVENOUS
  Administered 2014-05-29: 1 mg via INTRAVENOUS
  Administered 2014-05-29 (×2): 2 mg via INTRAVENOUS

## 2014-05-29 MED ORDER — ACETAMINOPHEN 650 MG RE SUPP: 650.0000 mg | Freq: Four times a day (QID) | RECTAL | Status: DC | PRN

## 2014-05-29 MED ORDER — IOHEXOL 300 MG/ML  SOLN
150.0000 mL | Freq: Once | INTRAMUSCULAR | Status: AC | PRN
  Administered 2014-05-29: 180 mL via INTRA_ARTERIAL

## 2014-05-29 MED ORDER — CHLORHEXIDINE GLUCONATE 0.12 % MT SOLN
15.0000 mL | Freq: Two times a day (BID) | OROMUCOSAL | Status: DC
  Administered 2014-05-29 – 2014-05-30 (×3): 15 mL via OROMUCOSAL
  Filled 2014-05-29 (×3): qty 15

## 2014-05-29 MED ORDER — ONDANSETRON HCL 4 MG/2ML IJ SOLN
4.0000 mg | Freq: Four times a day (QID) | INTRAMUSCULAR | Status: DC | PRN
  Administered 2014-05-31: 4 mg via INTRAVENOUS
  Filled 2014-05-29: qty 2

## 2014-05-29 MED ORDER — NICARDIPINE HCL IN NACL 20-0.86 MG/200ML-% IV SOLN
5.0000 mg/h | INTRAVENOUS | Status: DC
  Administered 2014-05-29: 7.5 mg/h via INTRAVENOUS
  Administered 2014-05-29: 5 mg/h via INTRAVENOUS
  Administered 2014-05-30: 11 mg/h via INTRAVENOUS
  Administered 2014-05-30: 15 mg/h via INTRAVENOUS
  Administered 2014-05-30: 7.5 mg/h via INTRAVENOUS
  Filled 2014-05-29 (×6): qty 200

## 2014-05-29 MED ORDER — CLOPIDOGREL BISULFATE 300 MG PO TABS: 300.0000 mg | ORAL_TABLET | Freq: Every day | ORAL | Status: DC

## 2014-05-29 MED ORDER — SODIUM CHLORIDE 0.9 % IV SOLN
INTRAVENOUS | Status: DC
  Administered 2014-05-29 – 2014-05-30 (×2): via INTRAVENOUS
  Administered 2014-05-31: 1000 mL via INTRAVENOUS

## 2014-05-29 MED ORDER — ASPIRIN 325 MG PO TABS: 650.0000 mg | ORAL_TABLET | Freq: Once | ORAL | Status: AC

## 2014-05-29 NOTE — Anesthesia Postprocedure Evaluation (Signed)
  Anesthesia Post-op Note  Patient: Rebecca Russo  Procedure(s) Performed: Procedure(s): RADIOLOGY WITH ANESTHESIA (N/A)  Patient Location: PACU  Anesthesia Type:General  Level of Consciousness: sedated and Patient remains intubated per anesthesia plan  Airway and Oxygen Therapy: Patient remains intubated per anesthesia plan and Patient placed on Ventilator (see vital sign flow sheet for setting)  Post-op Pain: none  Post-op Assessment: Post-op Vital signs reviewed, Patient's Cardiovascular Status Stable, Respiratory Function Stable, Patent Airway, No signs of Nausea or vomiting and Pain level controlled  Post-op Vital Signs: stable  Last Vitals:  Filed Vitals:   05/29/14 0930  BP: 97/47  Pulse: 70  Resp: 18    Complications: No apparent anesthesia complications

## 2014-05-29 NOTE — ED Notes (Signed)
Pt taken directly to CT. CT scan performed and then stayed in CT for a perfusion scan. Pt's family brought back to room and updated by physician. Pt brought back to ED room at 0940 and preparation made for IR.

## 2014-05-29 NOTE — Progress Notes (Signed)
Pt. Arrived from IR, pt nonresponsive and not moving extriemities at present, on vent

## 2014-05-29 NOTE — Anesthesia Procedure Notes (Signed)
Procedure Name: Intubation Date/Time: 05/29/2014 9:53 AM Performed by: Charm Barges, Shanisha Lech R Pre-anesthesia Checklist: Patient identified, Emergency Drugs available, Suction available, Patient being monitored and Timeout performed Patient Re-evaluated:Patient Re-evaluated prior to inductionOxygen Delivery Method: Circle system utilized Preoxygenation: Pre-oxygenation with 100% oxygen Intubation Type: IV induction Ventilation: Mask ventilation without difficulty Laryngoscope Size: 3 and Mac Grade View: Grade II Tube type: Subglottic suction tube Tube size: 7.5 mm Number of attempts: 1 Airway Equipment and Method: Stylet Placement Confirmation: ETT inserted through vocal cords under direct vision,  positive ETCO2 and breath sounds checked- equal and bilateral Secured at: 21 cm Tube secured with: Tape Dental Injury: Teeth and Oropharynx as per pre-operative assessment

## 2014-05-29 NOTE — Progress Notes (Signed)
Patient transported from Neuro PACU to room 3M06 without any complications.  RT will continue to monitor.

## 2014-05-29 NOTE — Transfer of Care (Signed)
Immediate Anesthesia Transfer of Care Note  Patient: Rebecca Russo  Procedure(s) Performed: Procedure(s): RADIOLOGY WITH ANESTHESIA (N/A)  Patient Location: PACU  Anesthesia Type:General  Level of Consciousness: unresponsive and Patient remains intubated per anesthesia plan  Airway & Oxygen Therapy: Patient remains intubated per anesthesia plan and Patient placed on Ventilator (see vital sign flow sheet for setting)  Post-op Assessment: Report given to PACU RN and Post -op Vital signs reviewed and stable  Post vital signs: Reviewed and stable  Complications: No apparent anesthesia complications

## 2014-05-29 NOTE — Code Documentation (Signed)
39yo female arriving to [facility] via [facility] at [phone].  Family heard the patient fall at 0745 and went to her bedroom to find her on the floor.  Patient was LKW at 2200 last night.  Family reports h/o IBS, cholecystectomy and migraines.  EMS called and activated code stroke for right sided weakness.  Stroke team at the bedside on arrival.  Patient taken to CT scan.  Patient with signs of large vessel occlusion on exam.  18G PIV started and CT perfusion completed per wake up stroke protocol.  NIHSS 27, see documentation for details and code stroke times.  Patient with left gaze, right hemiplegia and nonverbal.  CTP showing a mismatch.  Foley catheter placed by ED staff. IR notified at 0905, patient in IR at [phone]. Family consented to endovascular procedure.  Bedside handoff with IR staff.

## 2014-05-29 NOTE — Progress Notes (Signed)
05/29/14  Neuro IR RT 9FR FEMORAL SHEATH REMOVED AT 1645.  EXOSEAL AND MANUAL PRESSURE USED TO ACHIEVE HEMOSTASIS AT 1640. DISTAL PULSES INTACT, NO COMPLICATIONS, SITE REVIEWED WITH RN. PRESSURE DRESSING APPLIED. KRIS HINES Hamp [place]

## 2014-05-29 NOTE — Sedation Documentation (Signed)
Pt transferred to Neuro PACU accompanied by RN, 2 RT'S and CRNA.

## 2014-05-29 NOTE — H&P (Signed)
Admission H&P    Chief Complaint:  Code Stroke    HPI:                                                                                                                                         Rebecca Russo is an 39 y.o. female who went to sleep last night at around 2200 hours which is the last time she seen normal.  This AM at 0800 hours she was heard falling out of her bed.  Her son came into the room and found her on the floor in her PJ's with right arm and leg flaccidity and left gaze deviation. Patietn was brought to CT scan where a left MCA sigh was visualized. Due to age and likely a wake up stroke a CT perfusion was obtained showing large area of viable brain. Patient was brought straight to IR.   Date last known well: Date: 05/28/2013 Time last known well: Time: 22:00 tPA Given: No: out of window  Past Medical History  Diagnosis Date  . H/O varicella   . Increased BMI 12/17/08       Migraine headaches  History reviewed. No pertinent past surgical history.  Family History  Problem Relation Age of Onset  . Hypertension Mother   . Hypertension Father         Stroke             Father  Social History:  reports that she has never smoked. She does not have any smokeless tobacco history on file. She reports that she does not drink alcohol or use illicit drugs.  Allergies: No Known Allergies  Medications:                                                                                                                           Current Facility-Administered Medications  Medication Dose Route Frequency Provider Last Rate Last Dose  .  stroke: mapping our early stages of recovery book   Does not apply Once Ulice Dash, PA-C      . enoxaparin (LOVENOX) injection 40 mg  40 mg Subcutaneous Q24H Ulice Dash, PA-C      . lidocaine (XYLOCAINE) 1 % (with pres) injection           . nitroGLYCERIN 100 MCG/ML intra-arterial injection           .  senna-docusate (Senokot-S) tablet 1  tablet  1 tablet Oral QHS PRN Ulice Dash, PA-C       No current outpatient prescriptions on file.     ROS:                                                                                                                                       History obtained from patient's mother and brother.  General ROS: negative for - chills, fatigue, fever, night sweats, weight gain or weight loss Psychological ROS: negative for - behavioral disorder, hallucinations, memory difficulties, mood swings or suicidal ideation Ophthalmic ROS: negative for - blurry vision, double vision, eye pain or loss of vision ENT ROS: negative for - epistaxis, nasal discharge, oral lesions, sore throat, tinnitus or vertigo Allergy and Immunology ROS: negative for - hives or itchy/watery eyes Hematological and Lymphatic ROS: negative for - bleeding problems, bruising or swollen lymph nodes Endocrine ROS: negative for - galactorrhea, hair pattern changes, polydipsia/polyuria or temperature intolerance Respiratory ROS: negative for - cough, hemoptysis, shortness of breath or wheezing Cardiovascular ROS: negative for - chest pain, dyspnea on exertion, edema or irregular heartbeat Gastrointestinal ROS: negative for - abdominal pain, diarrhea, hematemesis, nausea/vomiting or stool incontinence Genito-Urinary ROS: negative for - dysuria, hematuria, incontinence or urinary frequency/urgency Musculoskeletal ROS: negative for - joint swelling or muscular weakness Neurological ROS: as noted in HPI; history of migraine headaches. Dermatological ROS: negative for rash and skin lesion changes  Examination:                                                                                                      Blood pressure 97/47, pulse 70, resp. rate 18, SpO2 98 %.  HEENT-  Normocephalic, no lesions, without obvious abnormality.  Normal external eye and conjunctiva.  Normal TM's bilaterally.  Normal auditory canals and external ears.  Normal external nose, mucus membranes and septum.  Normal pharynx. Cardiovascular- S1, S2 normal, pulses palpable throughout   Lungs- chest clear, no wheezing, rales, normal symmetric air entry Abdomen- normal findings: bowel sounds normal Extremities- no edema Lymph-no adenopathy palpable Musculoskeletal-no muscular tenderness noted Skin-warm and dry, no hyperpigmentation, vitiligo, or suspicious lesions  Neurological Examination Mental Status: Alert, mute, was able to follow simple command such as squeezing her left hand. No verbal output.  Cranial Nerves: II: Discs flat bilaterally; right hemianopsia, pupils equal, round, reactive to light and accommodation III,IV, VI: ptosis not present, extra-ocular motions intact bilaterally V,VII: smile asymmetric on the  right, facial light touch sensation decreased on the right to PP VIII: hearing normal bilaterally IX,X: gag reflex present XI: bilateral shoulder shrug XII: midline tongue extension Motor: Right : Upper extremity   0/5    Left:     Upper extremity   5/5  Lower extremity   0/5     Lower extremity   5/5 Tone and bulk:normal tone throughout; no atrophy noted Sensory: Pinprick and light touch not present on the right arm but did respond to pain on the right leg Deep Tendon Reflexes: 2+ and symmetric throughout Plantars: Right: downgoing   Left: downgoing Cerebellar: Unable to obtain Gait: not able to assess       Lab Results: Basic Metabolic Panel:  Recent Labs Lab 05/29/14 0900 05/29/14 0905  NA 139 139  K 4.0 4.0  CL 107 107  CO2 21  --   GLUCOSE 114* 113*  BUN 10 11  CREATININE 0.76 0.70  CALCIUM 9.3  --     Liver Function Tests:  Recent Labs Lab 05/29/14 0900  AST 29  ALT 22  ALKPHOS 73  BILITOT 0.4  PROT 7.2  ALBUMIN 3.5   No results for input(s): LIPASE, AMYLASE in the last 168 hours. No results for input(s): AMMONIA in the last 168 hours.  CBC:  Recent Labs Lab 05/29/14 0900  05/29/14 0905  WBC 10.6*  --   NEUTROABS 5.7  --   HGB 11.9* 12.9  HCT 35.6* 38.0  MCV 88.6  --   PLT 385  --     Cardiac Enzymes: No results for input(s): CKTOTAL, CKMB, CKMBINDEX, TROPONINI in the last 168 hours.  Lipid Panel: No results for input(s): CHOL, TRIG, HDL, CHOLHDL, VLDL, LDLCALC in the last 168 hours.  CBG: No results for input(s): GLUCAP in the last 168 hours.  Microbiology: Results for orders placed or performed during the hospital encounter of 04/03/07  Urine culture     Status: None   Collection Time: 04/03/07  9:47 PM  Result Value Ref Range Status   Specimen Description URINE, CLEAN CATCH  Final   Special Requests clean catch  Final   Colony Count >=100,000 COLONIES/ML  Final   Culture ESCHERICHIA COLI  Final   Report Status 04/07/2007 FINAL  Final   Organism ID, Bacteria ESCHERICHIA COLI  Final      Susceptibility   Escherichia coli - MIC    AMPICILLIN 8 Sensitive     CEFAZOLIN <=4 Sensitive     CEFTRIAXONE <=1 Sensitive     CIPROFLOXACIN <=0.25 Sensitive     GENTAMICIN <=1 Sensitive     LEVOFLOXACIN <=0.25 Sensitive     NITROFURANTOIN <=16 Sensitive     TOBRAMYCIN <=1 Sensitive     TRIMETH/SULFA <=20 Sensitive     Coagulation Studies:  Recent Labs  05/29/14 0900  LABPROT 13.5  INR 1.02    Imaging: No results found.   Felicie Morn PA-C [facility] [phone]  05/29/2014, 9:55 AM   Assessment: 39 y.o. female presenting to hospital with large left ICA territory CVA. LSN was 2200 hours last night. IV thrombolytic therapy with TPA could not be administered based on time last known well. Cerebral perfusion scan showed reduced perfusion involving left hemisphere, both ICA and MCA territories, indicative of ICA occlusion. There was a moderate sized penumbra.  Stroke Risk Factors - family history  Plan: 1. IR for intervention for cerebral angiogram and endovascular recanalization 2. Post interventional radiology management  and neuro intensive care unit  3. Stroke workup to include MRI of the brain, 2-D echocardiogram, hemoglobin A1c and fasting lipid panel, and studies to rule out possible hypercoagulable state 4. PT, OT and SLP intervention when feasible 5. Nothing by mouth until cleared by SLP 6. Stroke prophylaxis long-term with aspirin, if hypercoagulation studies are unremarkable, and CT scan 24 hours following IR procedures shows no significant intracranial hemorrhage.  This patient was critically ill and at significant risk of neurological worsening with severe impairment and possible death, and care required constant monitoring of vital signs, hemodynamics,respiratory and cardiac monitoring, neurological assessment, discussion with family, other specialists and medical decision making of high complexity. Total critical care time was 90 minutes.  Venetia Maxon M.D. [facility] [phone]

## 2014-05-29 NOTE — Anesthesia Preprocedure Evaluation (Addendum)
Anesthesia Evaluation  Patient identified by MRN, date of birth, ID band Patient unresponsive    Reviewed: Unable to perform ROS - Chart review onlyPreop documentation limited or incomplete due to emergent nature of procedure.  Airway Mallampati: II  TM Distance: >3 FB Neck ROM: Full    Dental  (+) Teeth Intact   Pulmonary  unknown         Cardiovascular  unknown   Neuro/Psych CVA    GI/Hepatic   Endo/Other    Renal/GU      Musculoskeletal   Abdominal   Peds  Hematology   Anesthesia Other Findings   Reproductive/Obstetrics                            Anesthesia Physical Anesthesia Plan  ASA: IV and emergent  Anesthesia Plan: General   Post-op Pain Management:    Induction: Intravenous  Airway Management Planned: Oral ETT  Additional Equipment: Arterial line  Intra-op Plan:   Post-operative Plan: Possible Post-op intubation/ventilation  Informed Consent: I have reviewed the patients History and Physical, chart, labs and discussed the procedure including the risks, benefits and alternatives for the proposed anesthesia with the patient or authorized representative who has indicated his/her understanding and acceptance.   History available from chart only  Plan Discussed with: CRNA, Anesthesiologist and Surgeon  Anesthesia Plan Comments:         Anesthesia Quick Evaluation

## 2014-05-29 NOTE — ED Notes (Signed)
Per EMS pt from home with c/o fall, when family found pt pt flaccid on right side, not speaking, facial droop. EMS called. Pt has left side gaze, right arm droop, right leg droop, not speaking. Right side facial droop.

## 2014-05-29 NOTE — Sedation Documentation (Signed)
Procedure end at this time. Pt to be transferred to CT for follow-up scan.

## 2014-05-29 NOTE — Progress Notes (Signed)
Called by Interventional for vent to be setup, pt. was Code Stroke arriving to [facility] ED, transported from Interventional to CT then to Neuro PACU, RT to monitor.

## 2014-05-29 NOTE — Sedation Documentation (Signed)
First Puncture time.

## 2014-05-29 NOTE — Consult Note (Signed)
PULMONARY / CRITICAL CARE MEDICINE   Name: Rebecca Russo MRN: [id number] DOB: [birth year]    ADMISSION DATE:  05/29/2014 CONSULTATION DATE:  05/29/2013  REFERRING MD :  Dr. Corliss Skains  CHIEF COMPLAINT:  Acute CVA and respiratory failure.  INITIAL PRESENTATION: 39 year old morbidly obese woman who was last seen normal at 10 PM on 05/28/2014 who was heard fall out of bed on 1/15 and EMS was called.  Patient was noted to have right arm flaccidity and left gaze.  CT was done and she was noted to have a potential for viable tissue and was taken to IR.  In IR the patient had a clot retrieval and was sent to the ICU.  PCCM was consulted for vent management.  STUDIES:  1/15 acute left sided MCA CVA, ischemic.  SIGNIFICANT EVENTS: 1/15 acute left sided MCA CVA, ischemic.  PAST MEDICAL HISTORY :   has a past medical history of H/O varicella and Increased BMI (12/17/08).  has no past surgical history on file. Prior to Admission medications   Not on File   No Known Allergies  FAMILY HISTORY:  has no family status information on file.  SOCIAL HISTORY:  reports that she has never smoked. She does not have any smokeless tobacco history on file. She reports that she does not drink alcohol or use illicit drugs.  REVIEW OF SYSTEMS:  Unable to obtain  SUBJECTIVE:   VITAL SIGNS: Pulse Rate:  [70] 70 (01/15 0930) Resp:  [18] 18 (01/15 0930) BP: (97)/(47) 97/47 mmHg (01/15 0930) SpO2:  [98 %] 98 % (01/15 0930) HEMODYNAMICS:   VENTILATOR SETTINGS:   INTAKE / OUTPUT: No intake or output data in the 24 hours ending 05/29/14 1102  PHYSICAL EXAMINATION: General:  Morbidly obese female, awake and moving all ext. Neuro:  Awake, moving all ext to command, weaker on right however and response is delayed on right. HEENT:  [place]/AT, PERRL, EOM-I and MMM. Cardiovascular:  RRR, Nl S1/S2, -M/R/G. Lungs:  CTA bilaterally but very distant. Abdomen:  Soft, NT, ND and +BS. Musculoskeletal:  -edema and  -tenderness. Skin:  1+ edema.  LABS:  CBC  Recent Labs Lab 05/29/14 0900 05/29/14 0905  WBC 10.6*  --   HGB 11.9* 12.9  HCT 35.6* 38.0  PLT 385  --    Coag's  Recent Labs Lab 05/29/14 0900  APTT 29  INR 1.02   BMET  Recent Labs Lab 05/29/14 0900 05/29/14 0905  NA 139 139  K 4.0 4.0  CL 107 107  CO2 21  --   BUN 10 11  CREATININE 0.76 0.70  GLUCOSE 114* 113*   Electrolytes  Recent Labs Lab 05/29/14 0900  CALCIUM 9.3   Sepsis Markers No results for input(s): LATICACIDVEN, PROCALCITON, O2SATVEN in the last 168 hours. ABG No results for input(s): PHART, PCO2ART, PO2ART in the last 168 hours. Liver Enzymes  Recent Labs Lab 05/29/14 0900  AST 29  ALT 22  ALKPHOS 73  BILITOT 0.4  ALBUMIN 3.5   Cardiac Enzymes No results for input(s): TROPONINI, PROBNP in the last 168 hours. Glucose No results for input(s): GLUCAP in the last 168 hours.  Imaging No results found.   ASSESSMENT / PLAN:  PULMONARY OETT 1/15>>> A: Acute respiratory failure due to acute CVA. P:   - Continue full vent support pending mental status. - F/U ABG and CXR. - ABG and CXR in AM. - Titrate O2 for sats. - VAP protocol.  CARDIOVASCULAR CVL None A: HTN likely  related to CVA. P:  - BP control per neuro's recommendations. - No need for CVP or CVL at this time.  RENAL A:  No active issues. P:   - BMET in AM. - Replace electrolytes as indicated.  GASTROINTESTINAL A:  No active issues. P:   - Consult nutrition for TF. - Protonix as ordered.  HEMATOLOGIC A:  No active issues. P:  - Monitor CBC. - Transfuse per ICU protocol.  INFECTIOUS A:  No signs of active infection. P:   Monitor fever curve and WBC.  ENDOCRINE A:  No history of diabetes   P:   Monitor CBGs on labs.  NEUROLOGIC A:  Acute ischemic left sided CVA, s/p IR procedure. P:   RASS goal: 0 Propofol drip. Fentanyl pushes. BP control per neuro. Further recommendations per  neuro.  FAMILY  - Updates: No family beside, updated by neuro.  TODAY'S SUMMARY: 39 year old super morbidly obese female presenting with acute left sided CVA, MCA distribution.  Intubated for airway protection.  S/P IR procedure.  Will adjust vent as needed.  Will start TF.  BP control and neuro recommendations per neuro.  The patient is critically ill with multiple organ systems failure and requires high complexity decision making for assessment and support, frequent evaluation and titration of therapies, application of advanced monitoring technologies and extensive interpretation of multiple databases.   Critical Care Time devoted to patient care services described in this note is  35  Minutes. This time reflects time of care of this signee Dr Koren Bound. This critical care time does not reflect procedure time, or teaching time or supervisory time of PA/NP/Med student/Med Resident etc but could involve care discussion time.  Alyson Reedy, M.D. [facility] Pulmonary/Critical Care Medicine. Pager: [phone]. After hours pager: [phone].  05/29/2014, 11:02 AM

## 2014-05-29 NOTE — ED Provider Notes (Signed)
CSN: [id number]     Arrival date & time 05/29/14  0846 History   First MD Initiated Contact with Patient 05/29/14 [phone]     No chief complaint on file.  level V caveat due to change in mental status an urgent need for intervention.  An emergency department physician performed an initial assessment on this suspected stroke patient at 0846 (Geofrey Silliman). (Consider location/radiation/quality/duration/timing/severity/associated sxs/prior Treatment) The history is provided by the patient and the EMS personnel.   patient presented as a code stroke. Presented after EMS was called. Patient reportedly fell. Last normal was when the patient went to bed last night. First seen this morning with the deficit. Patient is not moving the right side and is minimally verbal. She'll not look to the right.  Past Medical History  Diagnosis Date  . H/O varicella   . Increased BMI 12/17/08   No past surgical history on file. No family history on file. History  Substance Use Topics  . Smoking status: Never Smoker   . Smokeless tobacco: Not on file  . Alcohol Use: No   OB History    Gravida Para Term Preterm AB TAB SAB Ectopic Multiple Living   '2 2 2       2     '$ Review of Systems  Unable to perform ROS     Allergies  Review of patient's allergies indicates no known allergies.  Home Medications   Prior to Admission medications   Not on File   There were no vitals taken for this visit. Physical Exam  Constitutional: She appears well-developed and well-nourished.  HENT:  Head: Atraumatic.  Eyes:  Pupils are equal. Patient will not look to the right.   Neck: Neck supple.  Cardiovascular: Normal rate.   Pulmonary/Chest: Effort normal and breath sounds normal.  Abdominal: Soft.  Musculoskeletal: Normal range of motion. She exhibits no tenderness.  Neurological:  Patient will not look to the right. Flaccid on the right side. Some decreased verbal. Will squeeze hand to command on the left. Complete  NIH score done by neurology.  Skin: Skin is warm.    ED Course  Procedures (including critical care time) Labs Review Labs Reviewed  CBC - Abnormal; Notable for the following:    WBC 10.6 (*)    Hemoglobin 11.9 (*)    HCT 35.6 (*)    All other components within normal limits  DIFFERENTIAL - Abnormal; Notable for the following:    Lymphs Abs 4.1 (*)    All other components within normal limits  I-STAT CHEM 8, ED - Abnormal; Notable for the following:    Glucose, Bld 113 (*)    All other components within normal limits  ETHANOL  PROTIME-INR  APTT  COMPREHENSIVE METABOLIC PANEL  URINE RAPID DRUG SCREEN (HOSP PERFORMED)  URINALYSIS, ROUTINE W REFLEX MICROSCOPIC  I-STAT TROPOININ, ED  I-STAT TROPOININ, ED    Imaging Review No results found.   EKG Interpretation None      MDM   Final diagnoses:  Stroke    Patient with acute stroke. Last normal last night. Severe deficits. Met upon arrival by myself. Ct no bleed. CT perfusion done and pateitn was a candidate for interventional radiology. I discussed with patient's family.  CRITICAL CARE Performed by: Mackie Pai Total critical care time: 30 Critical care time was exclusive of separately billable procedures and treating other patients. Critical care was necessary to treat or prevent imminent or life-threatening deterioration. Critical care was time spent personally by me on  the following activities: development of treatment plan with patient and/or surrogate as well as nursing, discussions with consultants, evaluation of patient's response to treatment, examination of patient, obtaining history from patient or surrogate, ordering and performing treatments and interventions, ordering and review of laboratory studies, ordering and review of radiographic studies, pulse oximetry and re-evaluation of patient's condition.    Jasper Riling. Alvino Chapel, MD 06/01/14 1326

## 2014-05-29 NOTE — Procedures (Signed)
S/P bilateral common carotid  RTvertebral arteriograms. RT CFA appeoach. Findings. 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 S/P complete TIC 3 revascularization using 2 passes with Trevoprovue 4 mm x 30 mm retrieval device and 8 mg of superselective intracranial intraarterial  Integrelin

## 2014-05-29 NOTE — Sedation Documentation (Signed)
RECANULIZATION TIME 1132

## 2014-05-30 ENCOUNTER — Inpatient Hospital Stay (HOSPITAL_COMMUNITY): Payer: Managed Care, Other (non HMO)

## 2014-05-30 DIAGNOSIS — J9601 Acute respiratory failure with hypoxia: Secondary | ICD-10-CM | POA: Diagnosis not present

## 2014-05-30 LAB — LIPID PANEL
CHOL/HDL RATIO: 5 ratio
CHOL/HDL RATIO: 5.6 ratio
Cholesterol: 165 mg/dL (ref 0–200)
Cholesterol: 173 mg/dL (ref 0–200)
HDL: 33 mg/dL — AB (ref >39)
HDL: 31 mg/dL — ABNORMAL LOW (ref >39)
LDL CALC: 89 mg/dL (ref 0–99)
LDL Cholesterol: 97 mg/dL (ref 0–99)
TRIGLYCERIDES: 225 mg/dL — AB (ref <150)
Triglycerides: 214 mg/dL — ABNORMAL HIGH (ref <150)
VLDL: 43 mg/dL — AB (ref 0–40)
VLDL: 45 mg/dL — ABNORMAL HIGH (ref 0–40)

## 2014-05-30 LAB — BLOOD GAS, ARTERIAL
Acid-base deficit: 3.2 mmol/L — ABNORMAL HIGH (ref 0.0–2.0)
Bicarbonate: 20.9 mEq/L (ref 20.0–24.0)
DRAWN BY: 39898
FIO2: 0.4 %
MECHVT: 550 mL
O2 SAT: 97.6 %
PATIENT TEMPERATURE: 98.6
PCO2 ART: 35.3 mmHg (ref 35.0–45.0)
PEEP/CPAP: 5 cmH2O
PH ART: 7.39 (ref 7.350–7.450)
PO2 ART: 104 mmHg — AB (ref 80.0–100.0)
RATE: 16 resp/min
TCO2: 22 mmol/L (ref 0–100)

## 2014-05-30 LAB — CBC WITH DIFFERENTIAL/PLATELET
Basophils Absolute: 0 10*3/uL (ref 0.0–0.1)
Basophils Relative: 0 % (ref 0–1)
EOS PCT: 0 % (ref 0–5)
Eosinophils Absolute: 0.1 10*3/uL (ref 0.0–0.7)
HCT: 31.7 % — ABNORMAL LOW (ref 36.0–46.0)
Hemoglobin: 10.5 g/dL — ABNORMAL LOW (ref 12.0–15.0)
Lymphocytes Relative: 16 % (ref 12–46)
Lymphs Abs: 2.7 10*3/uL (ref 0.7–4.0)
MCH: 29.5 pg (ref 26.0–34.0)
MCHC: 33.1 g/dL (ref 30.0–36.0)
MCV: 89 fL (ref 78.0–100.0)
MONO ABS: 1 10*3/uL (ref 0.1–1.0)
Monocytes Relative: 6 % (ref 3–12)
NEUTROS ABS: 13.2 10*3/uL — AB (ref 1.7–7.7)
NEUTROS PCT: 78 % — AB (ref 43–77)
Platelets: 389 10*3/uL (ref 150–400)
RBC: 3.56 MIL/uL — ABNORMAL LOW (ref 3.87–5.11)
RDW: 13.8 % (ref 11.5–15.5)
WBC: 16.9 10*3/uL — AB (ref 4.0–10.5)

## 2014-05-30 LAB — BASIC METABOLIC PANEL
Anion gap: 6 (ref 5–15)
BUN: 6 mg/dL (ref 6–23)
CALCIUM: 8.4 mg/dL (ref 8.4–10.5)
CO2: 22 mmol/L (ref 19–32)
CREATININE: 0.67 mg/dL (ref 0.50–1.10)
Chloride: 109 mEq/L (ref 96–112)
GFR calc Af Amer: >90 mL/min (ref >90)
GFR calc non Af Amer: >90 mL/min (ref >90)
Glucose, Bld: 109 mg/dL — ABNORMAL HIGH (ref 70–99)
Potassium: 3.5 mmol/L (ref 3.5–5.1)
Sodium: 137 mmol/L (ref 135–145)

## 2014-05-30 LAB — GLUCOSE, CAPILLARY
GLUCOSE-CAPILLARY: 94 mg/dL (ref 70–99)
GLUCOSE-CAPILLARY: 108 mg/dL — AB (ref 70–99)
GLUCOSE-CAPILLARY: 99 mg/dL (ref 70–99)
Glucose-Capillary: 101 mg/dL — ABNORMAL HIGH (ref 70–99)
Glucose-Capillary: 111 mg/dL — ABNORMAL HIGH (ref 70–99)

## 2014-05-30 LAB — HEMOGLOBIN A1C
Hgb A1c MFr Bld: 5.6 % (ref <5.7)
Mean Plasma Glucose: 114 mg/dL (ref <117)

## 2014-05-30 LAB — MAGNESIUM: MAGNESIUM: 1.8 mg/dL (ref 1.5–2.5)

## 2014-05-30 LAB — PHOSPHORUS: Phosphorus: 2.9 mg/dL (ref 2.3–4.6)

## 2014-05-30 MED ORDER — MAGNESIUM SULFATE 2 GM/50ML IV SOLN
2.0000 g | Freq: Once | INTRAVENOUS | Status: AC
  Administered 2014-05-30: 2 g via INTRAVENOUS
  Filled 2014-05-30: qty 50

## 2014-05-30 MED ORDER — DEXTROSE 5 % IV SOLN
30.0000 mmol | Freq: Once | INTRAVENOUS | Status: AC
  Administered 2014-05-30: 30 mmol via INTRAVENOUS
  Filled 2014-05-30: qty 10

## 2014-05-30 MED ORDER — ROSUVASTATIN CALCIUM 5 MG PO TABS
5.0000 mg | ORAL_TABLET | Freq: Every day | ORAL | Status: DC
  Administered 2014-05-30 – 2014-06-03 (×5): 5 mg via ORAL
  Filled 2014-05-30 (×6): qty 1

## 2014-05-30 MED ORDER — FENTANYL CITRATE 0.05 MG/ML IJ SOLN
100.0000 ug | INTRAMUSCULAR | Status: DC | PRN
  Administered 2014-05-30 (×2): 100 ug via INTRAVENOUS
  Filled 2014-05-30 (×2): qty 2

## 2014-05-30 MED ORDER — ASPIRIN 325 MG PO TABS
325.0000 mg | ORAL_TABLET | Freq: Every day | ORAL | Status: DC
  Administered 2014-05-30 – 2014-06-04 (×6): 325 mg via ORAL
  Filled 2014-05-30 (×7): qty 1

## 2014-05-30 MED ORDER — CETYLPYRIDINIUM CHLORIDE 0.05 % MT LIQD: 7.0000 mL | Freq: Two times a day (BID) | OROMUCOSAL | Status: DC

## 2014-05-30 MED ORDER — CHLORHEXIDINE GLUCONATE 0.12 % MT SOLN
15.0000 mL | Freq: Two times a day (BID) | OROMUCOSAL | Status: DC
  Administered 2014-05-31: 15 mL via OROMUCOSAL
  Filled 2014-05-30: qty 15

## 2014-05-30 NOTE — Progress Notes (Addendum)
Stroke Team Progress Note  HISTORY Rebecca Russo is an 39 y.o. female who went to sleep last night at around 2200 hours which is the last time she seen normal. This AM at 0800 hours she was heard falling out of her bed. Her son came into the room and found her on the floor in her PJ's with right arm and leg flaccidity and left gaze deviation. Patient was brought to CT scan where a left MCA sign was visualized. Due to age and likely a wake up stroke a CT perfusion was obtained showing large area of viable brain. Patient was brought straight to IR.   Date last known well: Date: 05/28/2013 Time last known well: Time: 22:00 tPA Given: No: out of window  No family is at bedside, patient is intubated, alert and cooperative.   OBJECTIVE Most recent Vital Signs: Filed Vitals:   05/30/14 0530 05/30/14 0600 05/30/14 0630 05/30/14 0700  BP: 112/62 108/64  108/64  Pulse: 83 79 78 76  Temp:      TempSrc:      Resp: Height:      Weight:      SpO2: 100% 100% 100% 100%   CBG (last 3)   Recent Labs  05/29/14 1958 05/29/14 2346 05/30/14 0343  GLUCAP 83 91 101*    IV Fluid Intake:   . sodium chloride 75 mL/hr at 05/29/14 1900  . niCARDipine Stopped (05/30/14 0202)  . propofol 25 mcg/kg/min (05/30/14 0700)    MEDICATIONS  . antiseptic oral rinse  7 mL Mouth Rinse QID  .  ceFAZolin (ANCEF) IV  2 g Intravenous 3 times per day  . chlorhexidine  15 mL Mouth Rinse BID  . pantoprazole (PROTONIX) IV  40 mg Intravenous Q24H   PRN:  acetaminophen **OR** acetaminophen, fentaNYL, ondansetron (ZOFRAN) IV, senna-docusate  Diet:  Diet NPO time specified Activity:  Bedrest DVT Prophylaxis:  SCD  CLINICALLY SIGNIFICANT STUDIES Basic Metabolic Panel:  Recent Labs Lab 05/29/14 0900 05/29/14 0905 05/30/14 0345  NA 139 139 137  K 4.0 4.0 3.5  CL 107 107 109  CO2 21  --  22  GLUCOSE 114* 113* 109*  BUN CREATININE 0.76 0.70 0.67  CALCIUM 9.3  --  8.4  MG  --   --  1.8   PHOS  --   --  2.9   Liver Function Tests:  Recent Labs Lab 05/29/14 0900  AST 29  ALT 22  ALKPHOS 73  BILITOT 0.4  PROT 7.2  ALBUMIN 3.5   CBC:  Recent Labs Lab 05/29/14 0900 05/29/14 0905 05/30/14 0345  WBC 10.6*  --  16.9*  NEUTROABS 5.7  --  13.2*  HGB 11.9* 12.9 10.5*  HCT 35.6* 38.0 31.7*  MCV 88.6  --  89.0  PLT 385  --  389   Coagulation:  Recent Labs Lab 05/29/14 0900  LABPROT 13.5  INR 1.02   Cardiac Enzymes: No results for input(s): CKTOTAL, CKMB, CKMBINDEX, TROPONINI in the last 168 hours. Urinalysis:  Recent Labs Lab 05/29/14 0942  COLORURINE YELLOW  LABSPEC 1.029  PHURINE 5.0  GLUCOSEU NEGATIVE  HGBUR NEGATIVE  BILIRUBINUR NEGATIVE  KETONESUR NEGATIVE  PROTEINUR NEGATIVE  UROBILINOGEN 0.2  NITRITE NEGATIVE  LEUKOCYTESUR NEGATIVE   Lipid Panel    Component Value Date/Time   CHOL 165 05/30/2014 0345   TRIG 214* 05/30/2014 0345   HDL 33* 05/30/2014 0345   CHOLHDL 5.0 05/30/2014 0345  VLDL 43* 05/30/2014 0345   LDLCALC 89 05/30/2014 0345   HgbA1C No results found for: HGBA1C  Urine Drug Screen:     Component Value Date/Time   LABOPIA NONE DETECTED 05/29/2014 0942   COCAINSCRNUR NONE DETECTED 05/29/2014 0942   LABBENZ NONE DETECTED 05/29/2014 0942   AMPHETMU NONE DETECTED 05/29/2014 0942   THCU NONE DETECTED 05/29/2014 0942   LABBARB NONE DETECTED 05/29/2014 0942    Alcohol Level:  Recent Labs Lab 05/29/14 0900  ETH <5    Ct Head Wo Contrast  05/29/2014   CLINICAL DATA:  Subsequent encounter for stroke.  EXAM: CT HEAD WITHOUT CONTRAST  TECHNIQUE: Contiguous axial images were obtained from the base of the skull through the vertex without intravenous contrast.  COMPARISON:  Earlier the same day.  FINDINGS: There is no evidence for acute hemorrhage, hydrocephalus, mass lesion, or abnormal extra-axial fluid collection. Cortical sulcation in the left hemisphere is decreased compared to the right raising the question of underlying  diffuse hemispheric edema. No midline shift.  The visualized paranasal sinuses and mastoid air cells are clear.  IMPRESSION: Subtle decreased sulcation in the left cerebral hemisphere raising the question for diffuse edema. No evidence for acute intracranial hemorrhage.   Electronically Signed   By: Kennith Center M.D.   On: 05/29/2014 14:16   Ct Head Wo Contrast  05/29/2014   CLINICAL DATA:  Code stroke. RIGHT-sided weakness and neglect. Minimal speech.  EXAM: CT HEAD WITHOUT CONTRAST  TECHNIQUE: Contiguous axial images were obtained from the base of the skull through the vertex without intravenous contrast.  COMPARISON:  CT head 11/28/2005.  FINDINGS: The patient was unable to remain motionless for the exam. Small or subtle lesions could be overlooked.  The initial motion degraded exam was repeated with patient's head turned to the LEFT. No definite acute stroke or hemorrhage. No mass lesion or hydrocephalus. No extra-axial fluid.  Asymmetric hyperattenuation of the LEFT ICA in its cavernous and supraclinoid segments consistent with large vessel occlusion. LEFT MCA not well visualized but may be minimally hyper attenuating along its M1 course. Calvarium intact.  IMPRESSION: Motion degraded exam showing no definite acute stroke or hemorrhage. Hyperdense LEFT ICA suggesting proximal large vessel occlusion  Critical Value/emergent results were called by telephone at the time of interpretation on 05/29/2014 at 9:16 am to Dr. Noel Christmas , who verbally acknowledged these results.   Electronically Signed   By: Davonna Belling M.D.   On: 05/29/2014 09:22   Ct Cerebral Perfusion W/cm  05/29/2014   CLINICAL DATA:  New onset of RIGHT-sided weakness and neglect. Speech difficulty. Symptoms began earlier today.  EXAM: CT CEREBRAL PERFUSION WITH CONTRAST  TECHNIQUE: Dynamic scanning through the brain at 16 separate axial slice locations per performed during bolus infusion of Omnipaque.  CONTRAST:  40mL OMNIPAQUE IOHEXOL  350 MG/ML SOLN  COMPARISON:  Noncontrast CT head earlier in the day.  FINDINGS: The LEFT hemisphere is abnormal.  There is markedly increased mean transit time and time to peak perfusion throughout much of the LEFT frontal, LEFT temporal, and anterior parietal lobes as well as the LEFT basal ganglia. Involvement of the LEFT medial frontal cortex suggests ACA and MCA involvement.  Cerebral blood flow is markedly reduced within the above named territories. Cerebral blood volume is modestly diminished, particularly over the convexity, but not to the degree of abnormality seen on cerebral blood flow.  IMPRESSION: There could be a moderate-sized ischemic penumbra in the LEFT hemisphere in this patient with suspected  LEFT ICA occlusion.  At the time of dictation, the patient is in interventional radiology in preparation for possible thrombolysis.  Findings discussed with ordering provider approximately 9:55 a.m.   Electronically Signed   By: Davonna Belling M.D.   On: 05/29/2014 10:08   Dg Abd Portable 1v  05/29/2014   CLINICAL DATA:  Bedside orogastric tube placement.  EXAM: PORTABLE ABDOMEN - 1 VIEW  COMPARISON:  Two-view abdomen x-ray 09/04/2007.  FINDINGS: OG tube tip in the proximal body of the stomach. Gas within multiple loops of upper normal caliber small bowel throughout the abdomen. Moderate rectal stool burden. Right femoral vascular sheath. Surgical clips in a right upper quadrant from prior cholecystectomy. Regional skeleton unremarkable.  IMPRESSION: 1. OG tube tip in the proximal body of the stomach. 2. Early/incipient small bowel ileus.   Electronically Signed   By: Hulan Saas M.D.   On: 05/29/2014 16:06    CT of the brain   IMPRESSION: Subtle decreased sulcation in the left cerebral hemisphere raising the question for diffuse edema. No evidence for acute intracranial hemorrhage. MRI of the brain  pending  MRA of the brain    Carotid Doppler  Cerebral angiogram done  2D Echocardiogram   pending  CXR  pending  EKG  Sinus rhythm Borderline T wave abnormalities  Therapy Recommendations pending    Blood pressure 108/64, pulse 76, temperature 98.8 F (37.1 C), temperature source Axillary, resp. rate 17, height 5\' 6"  (1.676 m), weight 344 lb 12.8 oz (156.4 kg), SpO2 100 %.  Physical Exam  General: The patient is alert and cooperative at the time of the examination.  Respiratory: Lung fields are clear  Cardiovascular: Regular rate and rhythm, no obvious murmurs or rubs  Abdomen: Obese, nontender, positive bowel sounds  Skin: No significant peripheral edema is noted.   Neurologic Exam  Mental status: The patient is alert and cooperative, intubated at the time of examination.  Cranial nerves: Facial symmetry is present. Extraocular movements are full. Visual fields are full.  Motor: The patient has good strength in all 4 extremities.  Sensory examination: Patient indicates that there is good symmetry was soft touch on the face, arms, and legs.  Coordination: The cerebellar testing was not done.  Gait and station: The patient could not be ambulated, intubated.  Reflexes: Deep tendon reflexes are symmetric, but are depressed throughout.    ASSESSMENT [name] is a 39 y.o. female presenting with right hemiparesis. The patient did not receive TPA secondary to unknown time of onset. Perfusion scan revealed left brain at risk, the patient underwent clot extraction with good success. The patient has regained function of all 4 extremities, following verbal commands. The patient was on no medications prior to admission. On no aspirin. Hypercoagulable state workup has been ordered. The patient is currently intubated, critical care medicine following.   Left brain stroke, left carotid occlusion, status post clot extraction  LDL 89  Obesity   Hospital day # 1   The patient is doing quite well following the interventional procedure. She remains  intubated, but she clearly is following commands, moving all 4 extremities well. Patient likely had an embolic event with a left carotid occlusion. Workup is underway.  TREATMENT/PLAN  Aspirin therapy following MRI brain once hemorrhage is excluded  Low-dose Crestor when able to take by mouth  MRI brain to be done today  Critical care medicine following, possible extubation today.  Physical, occupational, and speech therapy to evaluate onset extubated  Mobilize patient once extubated  Lovenox therapy following MRI brain  Hypercoagulable state workup in progress    SIGNED  Lesly Dukes [phone]  To contact Stroke Continuity provider, please refer to [email or web address]. After hours, contact General Neurology

## 2014-05-30 NOTE — Progress Notes (Signed)
Dr. Corliss Skains said having MRI this afternoon was sufficient and head CT, which is ordered at the same time, was not necessary.

## 2014-05-30 NOTE — Progress Notes (Signed)
PULMONARY / CRITICAL CARE MEDICINE   Name: Rebecca Russo MRN: [id number] DOB: [birth year]    ADMISSION DATE:  05/29/2014 CONSULTATION DATE:  05/29/2013  REFERRING MD :  Dr. Corliss Skains  CHIEF COMPLAINT:  Acute CVA and respiratory failure.  INITIAL PRESENTATION: 39 year old morbidly obese woman who was last seen normal at 10 PM on 05/28/2014 who was heard fall out of bed on 1/15 and EMS was called.  Patient was noted to have right arm flaccidity and left gaze.  CT was done and she was noted to have a potential for viable tissue and was taken to IR.  In IR the patient had a clot retrieval and was sent to the ICU.  PCCM was consulted for vent management.  STUDIES:  1/15 acute left sided MCA CVA, ischemic.  SIGNIFICANT EVENTS: 1/15 acute left sided MCA CVA, ischemic.  SUBJECTIVE: Alert, interactive and following commands.  VITAL SIGNS: Temp:  [96.8 F (36 C)-99 F (37.2 C)] 98.3 F (36.8 C) (01/16 0730) Pulse Rate:  [63-111] 111 (01/16 0830) Resp:  [11-30] 24 (01/16 0830) BP: (97-148)/(36-100) 115/66 mmHg (01/16 0830) SpO2:  [94 %-100 %] 100 % (01/16 0830) Arterial Line BP: (75-153)/(62-89) 139/71 mmHg (01/16 0830) FiO2 (%):  [40 %-100 %] 40 % (01/16 0805) Weight:  [156.4 kg (344 lb 12.8 oz)] 156.4 kg (344 lb 12.8 oz) (01/15 1430)  HEMODYNAMICS:    VENTILATOR SETTINGS: Vent Mode:  [-] CPAP;PSV FiO2 (%):  [40 %-100 %] 40 % Set Rate:  [10 bmp-16 bmp] 16 bmp Vt Set:  [550 mL-650 mL] 550 mL PEEP:  [5 cmH20] 5 cmH20 Pressure Support:  [5 cmH20] 5 cmH20 Plateau Pressure:  [14 cmH20-19 cmH20] 19 cmH20  INTAKE / OUTPUT:  Intake/Output Summary (Last 24 hours) at 05/30/14 0839 Last data filed at 05/30/14 0800  Gross per 24 hour  Intake 3009.65 ml  Output   2095 ml  Net 914.65 ml   PHYSICAL EXAMINATION: General:  Morbidly obese female, awake and moving all ext. Neuro:  Awake, moving all ext to command, weaker on right however and response is delayed on right. HEENT:  [place]/AT,  PERRL, EOM-I and MMM. Cardiovascular:  RRR, Nl S1/S2, -M/R/G. Lungs:  CTA bilaterally but very distant. Abdomen:  Soft, NT, ND and +BS. Musculoskeletal:  -edema and -tenderness. Skin:  1+ edema.  LABS:  CBC  Recent Labs Lab 05/29/14 0900 05/29/14 0905 05/30/14 0345  WBC 10.6*  --  16.9*  HGB 11.9* 12.9 10.5*  HCT 35.6* 38.0 31.7*  PLT 385  --  389   Coag's  Recent Labs Lab 05/29/14 0900  APTT 29  INR 1.02   BMET  Recent Labs Lab 05/29/14 0900 05/29/14 0905 05/30/14 0345  NA 139 139 137  K 4.0 4.0 3.5  CL 107 107 109  CO2 21  --  22  BUN CREATININE 0.76 0.70 0.67  GLUCOSE 114* 113* 109*   Electrolytes  Recent Labs Lab 05/29/14 0900 05/30/14 0345  CALCIUM 9.3 8.4  MG  --  1.8  PHOS  --  2.9   Sepsis Markers No results for input(s): LATICACIDVEN, PROCALCITON, O2SATVEN in the last 168 hours. ABG  Recent Labs Lab 05/29/14 1356 05/30/14 0500  PHART 7.320* 7.390  PCO2ART 43.2 35.3  PO2ART 360.0* 104.0*   Liver Enzymes  Recent Labs Lab 05/29/14 0900  AST 29  ALT 22  ALKPHOS 73  BILITOT 0.4  ALBUMIN 3.5   Cardiac Enzymes No results for input(s): TROPONINI,  PROBNP in the last 168 hours. Glucose  Recent Labs Lab 05/29/14 1608 05/29/14 1958 05/29/14 2346 05/30/14 0343 05/30/14 0757  GLUCAP 105* 83 91 101* 94    Imaging Ct Head Wo Contrast  05/29/2014   CLINICAL DATA:  Subsequent encounter for stroke.  EXAM: CT HEAD WITHOUT CONTRAST  TECHNIQUE: Contiguous axial images were obtained from the base of the skull through the vertex without intravenous contrast.  COMPARISON:  Earlier the same day.  FINDINGS: There is no evidence for acute hemorrhage, hydrocephalus, mass lesion, or abnormal extra-axial fluid collection. Cortical sulcation in the left hemisphere is decreased compared to the right raising the question of underlying diffuse hemispheric edema. No midline shift.  The visualized paranasal sinuses and mastoid air cells are  clear.  IMPRESSION: Subtle decreased sulcation in the left cerebral hemisphere raising the question for diffuse edema. No evidence for acute intracranial hemorrhage.   Electronically Signed   By: Kennith Center M.D.   On: 05/29/2014 14:16   Ct Head Wo Contrast  05/29/2014   CLINICAL DATA:  Code stroke. RIGHT-sided weakness and neglect. Minimal speech.  EXAM: CT HEAD WITHOUT CONTRAST  TECHNIQUE: Contiguous axial images were obtained from the base of the skull through the vertex without intravenous contrast.  COMPARISON:  CT head 11/28/2005.  FINDINGS: The patient was unable to remain motionless for the exam. Small or subtle lesions could be overlooked.  The initial motion degraded exam was repeated with patient's head turned to the LEFT. No definite acute stroke or hemorrhage. No mass lesion or hydrocephalus. No extra-axial fluid.  Asymmetric hyperattenuation of the LEFT ICA in its cavernous and supraclinoid segments consistent with large vessel occlusion. LEFT MCA not well visualized but may be minimally hyper attenuating along its M1 course. Calvarium intact.  IMPRESSION: Motion degraded exam showing no definite acute stroke or hemorrhage. Hyperdense LEFT ICA suggesting proximal large vessel occlusion  Critical Value/emergent results were called by telephone at the time of interpretation on 05/29/2014 at 9:16 am to Dr. Noel Christmas , who verbally acknowledged these results.   Electronically Signed   By: Davonna Belling M.D.   On: 05/29/2014 09:22   Ct Cerebral Perfusion W/cm  05/29/2014   CLINICAL DATA:  New onset of RIGHT-sided weakness and neglect. Speech difficulty. Symptoms began earlier today.  EXAM: CT CEREBRAL PERFUSION WITH CONTRAST  TECHNIQUE: Dynamic scanning through the brain at 16 separate axial slice locations per performed during bolus infusion of Omnipaque.  CONTRAST:  42mL OMNIPAQUE IOHEXOL 350 MG/ML SOLN  COMPARISON:  Noncontrast CT head earlier in the day.  FINDINGS: The LEFT hemisphere is  abnormal.  There is markedly increased mean transit time and time to peak perfusion throughout much of the LEFT frontal, LEFT temporal, and anterior parietal lobes as well as the LEFT basal ganglia. Involvement of the LEFT medial frontal cortex suggests ACA and MCA involvement.  Cerebral blood flow is markedly reduced within the above named territories. Cerebral blood volume is modestly diminished, particularly over the convexity, but not to the degree of abnormality seen on cerebral blood flow.  IMPRESSION: There could be a moderate-sized ischemic penumbra in the LEFT hemisphere in this patient with suspected LEFT ICA occlusion.  At the time of dictation, the patient is in interventional radiology in preparation for possible thrombolysis.  Findings discussed with ordering provider approximately 9:55 a.m.   Electronically Signed   By: Davonna Belling M.D.   On: 05/29/2014 10:08   Dg Abd Portable 1v  05/29/2014   CLINICAL  DATA:  Bedside orogastric tube placement.  EXAM: PORTABLE ABDOMEN - 1 VIEW  COMPARISON:  Two-view abdomen x-ray 09/04/2007.  FINDINGS: OG tube tip in the proximal body of the stomach. Gas within multiple loops of upper normal caliber small bowel throughout the abdomen. Moderate rectal stool burden. Right femoral vascular sheath. Surgical clips in a right upper quadrant from prior cholecystectomy. Regional skeleton unremarkable.  IMPRESSION: 1. OG tube tip in the proximal body of the stomach. 2. Early/incipient small bowel ileus.   Electronically Signed   By: Hulan Saas M.D.   On: 05/29/2014 16:06   ASSESSMENT / PLAN:  PULMONARY OETT 1/15>>> A: Acute respiratory failure due to acute CVA. P:   - Extubate today. - Titrate O2 for sats. - IS and flutter valve.  CARDIOVASCULAR CVL None A: HTN likely related to CVA. P:  - BP control per neuro's recommendations. - No need for CVP or CVL at this time.  RENAL A:  No active issues. P:   - BMET in AM. - Replace electrolytes as  indicated.  GASTROINTESTINAL A:  No active issues. P:   - Swallow evaluation. - TF via NGT (keep in place for now). - Protonix as ordered.  HEMATOLOGIC A:  No active issues. P:  - Monitor CBC. - Transfuse per ICU protocol.  INFECTIOUS A:  No signs of active infection. P:   - Monitor fever curve and WBC off abx.  ENDOCRINE A:  No history of diabetes   P:   - Monitor CBGs on labs.  NEUROLOGIC A:  Acute ischemic left sided CVA, s/p IR procedure. P:   D/C sedation. BP control per neuro. Further recommendations per neuro.  FAMILY  - Updates: No family beside.  TODAY'S SUMMARY: 39 year old morbidly obese female presenting with acute left sided CVA, MCA distribution.  Extubate today.  Swallow evaluation, if fails then continue TF.  BP parameters per neuro.  The patient is critically ill with multiple organ systems failure and requires high complexity decision making for assessment and support, frequent evaluation and titration of therapies, application of advanced monitoring technologies and extensive interpretation of multiple databases.   Critical Care Time devoted to patient care services described in this note is  35  Minutes. This time reflects time of care of this signee Dr Koren Bound. This critical care time does not reflect procedure time, or teaching time or supervisory time of PA/NP/Med student/Med Resident etc but could involve care discussion time.  Alyson Reedy, M.D. [facility] Pulmonary/Critical Care Medicine. Pager: [phone]. After hours pager: [phone].  05/30/2014, 8:39 AM

## 2014-05-30 NOTE — Evaluation (Signed)
Speech Language Pathology Evaluation Patient Details Name: Rebecca Russo MRN: [id number] DOB: Oct 04, 1975 Today's Date: 05/30/2014 Time: 6433-2951 SLP Time Calculation (min) (ACUTE ONLY): 19 min  Problem List:  Patient Active Problem List   Diagnosis Date Noted  . Acute respiratory failure with hypoxia   . Stroke 05/29/2014  . CVA (cerebral vascular accident) 05/29/2014  . Aphasia complicating stroke 05/29/2014  . Hemiplegia affecting dominant side 05/29/2014  . CONSTIPATION 03/09/2008  . IRRITABLE BOWEL SYNDROME 03/09/2008   Past Medical History:  Past Medical History  Diagnosis Date  . H/O varicella   . Increased BMI 12/17/08   Past Surgical History: History reviewed. No pertinent past surgical history. HPI:  Rebecca Russo is an 39 y.o. female who went to sleep 05/28/14 around 2200 hours which is the last time she was seen normal. The next morning at 0800 hours she was heard falling out of her bed. Her son came into the room and found her on the floor in her PJ's with right arm and leg flaccidity and left gaze deviation. Patient was brought to CT scan where a left MCA sign was visualized. Due to age and likely a wake up stroke a CT perfusion was obtained showing large area of viable brain. Patient was brought straight to IR. Pt was intubated 1/15-1/16.   Assessment / Plan / Recommendation Clinical Impression  Pt has an expressive > receptive aphasia, with expressive communication limited at the word level by perseverative errors. Phonemic paraphasias noted as well. Despite multimodal cueing by SLP, pt was unable to consistently discriminate between words or pictures to effectively utilize communication board at this time. She does answer basic yes/no questions with ~90% accuracy with extra time and self-correction, making this the most effective means of communicating at this time. Pt will benefit from skilled SLP services to maximize functional communication and independence.    SLP  Assessment  Patient needs continued Speech Lanaguage Pathology Services    Follow Up Recommendations  Inpatient Rehab;24 hour supervision/assistance    Frequency and Duration min 2x/week  2 weeks   Pertinent Vitals/Pain Pain Assessment: No/denies pain   SLP Goals  Patient/Family Stated Goal: none stated Potential to Achieve Goals (ACUTE ONLY): Good  SLP Evaluation Prior Functioning  Cognitive/Linguistic Baseline: Within functional limits  Lives With: Son (22 yo) Available Help at Discharge: Family;Available 24 hours/day Vocation: Full time employment   Cognition  Overall Cognitive Status: Difficult to assess (aphasia) Arousal/Alertness: Awake/alert Orientation Level: Oriented to person;Oriented to place;Oriented to situation (assessed via yes/no questions) Attention: Sustained Sustained Attention: Appears intact Awareness: Impaired Awareness Impairment: Emergent impairment Problem Solving: Appears intact (basic problem solving)    Comprehension  Auditory Comprehension Overall Auditory Comprehension: Impaired Yes/No Questions: Impaired Basic Biographical Questions: 76-100% accurate Basic Immediate Environment Questions: 75-100% accurate Commands: Impaired One Step Basic Commands: 75-100% accurate Other Conversation Comments: perseverative errors noted Visual Recognition/Discrimination Discrimination: Exceptions to [facility] Pictures: Unable to indentify Reading Comprehension Reading Status: Within funtional limits Word level: Impaired    Expression Expression Primary Mode of Expression: Nonverbal - gestures (head nods) Verbal Expression Overall Verbal Expression: Impaired Automatic Speech:  (unable to state name) Repetition: Impaired Level of Impairment: Word level Naming: Impairment Confrontation: Within functional limits Pictures: Unable to indentify Verbal Errors: Phonemic paraphasias;Perseveration;Not aware of errors (intermittently aware of errors) Pragmatics:  No impairment Non-Verbal Means of Communication: Other (comment) (head nods) Written Expression Written Expression: Not tested   Oral / Motor Oral Motor/Sensory Function Overall Oral Motor/Sensory Function: Impaired Labial ROM:  Reduced right Labial Symmetry: Abnormal symmetry right Labial Strength: Reduced Labial Sensation: Reduced Motor Speech Overall Motor Speech:  (difficult to assess given aphasia)   GO      Maxcine Ham, M.A. CCC-SLP [phone]  Maxcine Ham 05/30/2014, 2:10 PM

## 2014-05-30 NOTE — Evaluation (Signed)
Clinical/Bedside Swallow Evaluation Patient Details  Name: Rebecca Russo MRN: [id number] Date of Birth: Jul 10, 1975  Today's Date: 05/30/2014 Time: 1301-1320 SLP Time Calculation (min) (ACUTE ONLY): 19 min  Past Medical History:  Past Medical History  Diagnosis Date  . H/O varicella   . Increased BMI 12/17/08   Past Surgical History: History reviewed. No pertinent past surgical history. HPI:  Rebecca Russo is an 39 y.o. female who went to sleep 05/28/14 around 2200 hours which is the last time she was seen normal. The next morning at 0800 hours she was heard falling out of her bed. Her son came into the room and found her on the floor in her PJ's with right arm and leg flaccidity and left gaze deviation. Patient was brought to CT scan where a left MCA sign was visualized. Due to age and likely a wake up stroke a CT perfusion was obtained showing large area of viable brain. Patient was brought straight to IR. Pt was intubated 1/15-1/16.   Assessment / Plan / Recommendation Clinical Impression  Pt has immediate throat clearing following sips of thin liquid, concerning for premature spillage due to decreased oral containment. Pt has right sided weakness and decreased sensation, although with SLP intervention for nectar thick liquids, there are no overt signs of aspiration observed. Pt has decreased awareness and cohesion with solid foods. Recommend initiation of Dys 2 diet and nectar thick liquids. SLP to follow for tolerance and advancement.    Aspiration Risk  Moderate    Diet Recommendation Dysphagia 2 (Fine chop);Nectar-thick liquid   Liquid Administration via: Cup;Straw Medication Administration: Whole meds with puree Supervision: Patient able to self feed;Full supervision/cueing for compensatory strategies Compensations: Slow rate;Small sips/bites;Check for pocketing;Check for anterior loss Postural Changes and/or Swallow Maneuvers: Seated upright 90 degrees    Other   Recommendations Oral Care Recommendations: Oral care BID Other Recommendations: Order thickener from pharmacy;Prohibited food (jello, ice cream, thin soups);Remove water pitcher   Follow Up Recommendations  Inpatient Rehab;24 hour supervision/assistance    Frequency and Duration min 2x/week  2 weeks   Pertinent Vitals/Pain n/a    SLP Swallow Goals     Swallow Study Prior Functional Status       General Date of Onset: 05/29/14 HPI: Rebecca Russo is an 39 y.o. female who went to sleep 05/28/14 around 2200 hours which is the last time she was seen normal. The next morning at 0800 hours she was heard falling out of her bed. Her son came into the room and found her on the floor in her PJ's with right arm and leg flaccidity and left gaze deviation. Patient was brought to CT scan where a left MCA sign was visualized. Due to age and likely a wake up stroke a CT perfusion was obtained showing large area of viable brain. Patient was brought straight to IR. Pt was intubated 1/15-1/16. Type of Study: Bedside swallow evaluation Previous Swallow Assessment: none in chart Diet Prior to this Study: NPO;Panda Temperature Spikes Noted: No Respiratory Status: Nasal cannula History of Recent Intubation: Yes Length of Intubations (days): 1 days Date extubated: 05/30/14 Behavior/Cognition: Alert;Cooperative;Pleasant mood;Other (comment) (aphasia, perseverative) Oral Cavity - Dentition: Adequate natural dentition Self-Feeding Abilities: Able to feed self;Needs assist Patient Positioning: Upright in bed Baseline Vocal Quality: Hoarse;Low vocal intensity Volitional Swallow: Able to elicit    Oral/Motor/Sensory Function Overall Oral Motor/Sensory Function: Impaired (difficulty following commands to fully assess) Labial ROM: Reduced right Labial Symmetry: Abnormal symmetry right Labial Strength: Reduced Labial  Sensation: Reduced   Ice Chips Ice chips: Not tested   Thin Liquid Thin Liquid:  Impaired Presentation: Cup;Self Fed;Straw Pharyngeal  Phase Impairments: Suspected delayed Swallow;Throat Clearing - Immediate    Nectar Thick Nectar Thick Liquid: Impaired Presentation: Self Fed;Straw;Cup Pharyngeal Phase Impairments: Suspected delayed Swallow   Honey Thick Honey Thick Liquid: Not tested   Puree Puree: Impaired Presentation: Self Fed;Spoon Oral Phase Impairments: Poor awareness of bolus   Solid   GO    Solid: Impaired Presentation: Self Fed Oral Phase Impairments: Poor awareness of bolus;Impaired anterior to posterior transit Oral Phase Functional Implications: Oral residue      Rebecca Russo, M.A. CCC-SLP [phone]  Rebecca Russo 05/30/2014,2:00 PM

## 2014-05-30 NOTE — Progress Notes (Signed)
Patient refused MRI once she got into machine. CT head was performed instead.

## 2014-05-30 NOTE — Procedures (Signed)
Extubation Procedure Note  Patient Details:   Name: [name] DOB: 1976-01-21 MRN: [id number]   Airway Documentation:     Evaluation  O2 sats: stable throughout Complications: No apparent complications Patient did tolerate procedure well. Bilateral Breath Sounds: Clear, Diminished   Yes   Pt extubated per MD order.  Pt tolerated well, Pt placed on 4l Fisher with sats 99%.  RT will continue to monitor.  Closson, Terie Purser 05/30/2014, 8:38 AM

## 2014-05-30 NOTE — Progress Notes (Signed)
1 Day Post-Op  Subjective:  Pt extubated 2 hours ago ; doing fairly well; awaiting  MRI brain; following commands; has mild HA  Objective: Vital signs in last 24 hours: Temp:  [96.8 F (36 C)-99 F (37.2 C)] 98.3 F (36.8 C) (01/16 0730) Pulse Rate:  [63-111] 83 (01/16 1030) Resp:  [11-30] 19 (01/16 1030) BP: (101-150)/(36-100) 150/71 mmHg (01/16 1030) SpO2:  [94 %-100 %] 99 % (01/16 1030) Arterial Line BP: (75-162)/(62-89) 156/84 mmHg (01/16 1030) FiO2 (%):  [40 %-100 %] 40 % (01/16 0805) Weight:  [344 lb 12.8 oz (156.4 kg)] 344 lb 12.8 oz (156.4 kg) (01/15 1430)    Intake/Output from previous day: 01/15 0701 - 01/16 0700 In: 2911.2 [I.V.:2761.2; IV Piggyback:150] Out: 1725 [Urine:1725] Intake/Output this shift: Total I/O In: 395.3 [I.V.:260.3; IV Piggyback:135] Out: 870 [Urine:870]   PT awake/FC, aphasic, rt facial droop, moving all fours well; PERRL/EOMI; rt CFA puncture site clean and dry,mildly tender, no discrete hematoma, pulses intact, feet warm  Lab Results:   Recent Labs  05/29/14 0900 05/29/14 0905 05/30/14 0345  WBC 10.6*  --  16.9*  HGB 11.9* 12.9 10.5*  HCT 35.6* 38.0 31.7*  PLT 385  --  389   BMET  Recent Labs  05/29/14 0900 05/29/14 0905 05/30/14 0345  NA 139 139 137  K 4.0 4.0 3.5  CL 107 107 109  CO2 21  --  22  GLUCOSE 114* 113* 109*  BUN CREATININE 0.76 0.70 0.67  CALCIUM 9.3  --  8.4   PT/INR  Recent Labs  05/29/14 0900  LABPROT 13.5  INR 1.02   ABG  Recent Labs  05/29/14 1356 05/30/14 0500  PHART 7.320* 7.390  HCO3 22.2 20.9    Studies/Results: Ct Head Wo Contrast  05/29/2014   CLINICAL DATA:  Subsequent encounter for stroke.  EXAM: CT HEAD WITHOUT CONTRAST  TECHNIQUE: Contiguous axial images were obtained from the base of the skull through the vertex without intravenous contrast.  COMPARISON:  Earlier the same day.  FINDINGS: There is no evidence for acute hemorrhage, hydrocephalus, mass lesion, or abnormal  extra-axial fluid collection. Cortical sulcation in the left hemisphere is decreased compared to the right raising the question of underlying diffuse hemispheric edema. No midline shift.  The visualized paranasal sinuses and mastoid air cells are clear.  IMPRESSION: Subtle decreased sulcation in the left cerebral hemisphere raising the question for diffuse edema. No evidence for acute intracranial hemorrhage.   Electronically Signed   By: Kennith Center M.D.   On: 05/29/2014 14:16   Ct Head Wo Contrast  05/29/2014   CLINICAL DATA:  Code stroke. RIGHT-sided weakness and neglect. Minimal speech.  EXAM: CT HEAD WITHOUT CONTRAST  TECHNIQUE: Contiguous axial images were obtained from the base of the skull through the vertex without intravenous contrast.  COMPARISON:  CT head 11/28/2005.  FINDINGS: The patient was unable to remain motionless for the exam. Small or subtle lesions could be overlooked.  The initial motion degraded exam was repeated with patient's head turned to the LEFT. No definite acute stroke or hemorrhage. No mass lesion or hydrocephalus. No extra-axial fluid.  Asymmetric hyperattenuation of the LEFT ICA in its cavernous and supraclinoid segments consistent with large vessel occlusion. LEFT MCA not well visualized but may be minimally hyper attenuating along its M1 course. Calvarium intact.  IMPRESSION: Motion degraded exam showing no definite acute stroke or hemorrhage. Hyperdense LEFT ICA suggesting proximal large vessel occlusion  Critical Value/emergent results were  called by telephone at the time of interpretation on 05/29/2014 at 9:16 am to Dr. Noel Christmas , who verbally acknowledged these results.   Electronically Signed   By: Davonna Belling M.D.   On: 05/29/2014 09:22   Ct Cerebral Perfusion W/cm  05/29/2014   CLINICAL DATA:  New onset of RIGHT-sided weakness and neglect. Speech difficulty. Symptoms began earlier today.  EXAM: CT CEREBRAL PERFUSION WITH CONTRAST  TECHNIQUE: Dynamic scanning  through the brain at 16 separate axial slice locations per performed during bolus infusion of Omnipaque.  CONTRAST:  46mL OMNIPAQUE IOHEXOL 350 MG/ML SOLN  COMPARISON:  Noncontrast CT head earlier in the day.  FINDINGS: The LEFT hemisphere is abnormal.  There is markedly increased mean transit time and time to peak perfusion throughout much of the LEFT frontal, LEFT temporal, and anterior parietal lobes as well as the LEFT basal ganglia. Involvement of the LEFT medial frontal cortex suggests ACA and MCA involvement.  Cerebral blood flow is markedly reduced within the above named territories. Cerebral blood volume is modestly diminished, particularly over the convexity, but not to the degree of abnormality seen on cerebral blood flow.  IMPRESSION: There could be a moderate-sized ischemic penumbra in the LEFT hemisphere in this patient with suspected LEFT ICA occlusion.  At the time of dictation, the patient is in interventional radiology in preparation for possible thrombolysis.  Findings discussed with ordering provider approximately 9:55 a.m.   Electronically Signed   By: Davonna Belling M.D.   On: 05/29/2014 10:08   Dg Chest Port 1 View  05/30/2014   CLINICAL DATA:  Shortness of breath. Stroke. Acute respiratory failure. On ventilator.  EXAM: PORTABLE CHEST - 1 VIEW  COMPARISON:  None.  FINDINGS: Endotracheal tube and nasogastric tube are seen in appropriate position. Low lung volumes are noted with mild atelectasis in right lung base. No evidence of pulmonary consolidation or edema. No evidence of pleural effusion pneumothorax. Heart size is normal.  IMPRESSION: Low lung volumes with mild right basilar atelectasis.   Electronically Signed   By: Myles Rosenthal M.D.   On: 05/30/2014 08:27   Dg Abd Portable 1v  05/29/2014   CLINICAL DATA:  Bedside orogastric tube placement.  EXAM: PORTABLE ABDOMEN - 1 VIEW  COMPARISON:  Two-view abdomen x-ray 09/04/2007.  FINDINGS: OG tube tip in the proximal body of the stomach. Gas  within multiple loops of upper normal caliber small bowel throughout the abdomen. Moderate rectal stool burden. Right femoral vascular sheath. Surgical clips in a right upper quadrant from prior cholecystectomy. Regional skeleton unremarkable.  IMPRESSION: 1. OG tube tip in the proximal body of the stomach. 2. Early/incipient small bowel ileus.   Electronically Signed   By: Hulan Saas M.D.   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 S/P complete TIC 3 revascularization using 2 passes with Trevoprovue 4 mm x 30 mm retrieval device and 8 mg of superselective  intracranial intraarterial Integrelin 1/15. Check CT brain; other plans as per neurology;CCM            LOS: 1 day    Annalysia Willenbring,D [facility] 05/30/2014

## 2014-05-30 NOTE — Progress Notes (Signed)
SLP Cancellation Note  Patient Details Name: Rebecca Russo MRN: [id number] DOB: [birth year]   Cancelled treatment:       Reason Eval/Treat Not Completed: Patient not medically ready   Mosie Angus, Riley Nearing 05/30/2014, 7:15 AM

## 2014-05-31 ENCOUNTER — Inpatient Hospital Stay (HOSPITAL_COMMUNITY): Payer: Managed Care, Other (non HMO)

## 2014-05-31 DIAGNOSIS — G931 Anoxic brain damage, not elsewhere classified: Secondary | ICD-10-CM

## 2014-05-31 DIAGNOSIS — I6782 Cerebral ischemia: Secondary | ICD-10-CM | POA: Diagnosis present

## 2014-05-31 LAB — GLUCOSE, CAPILLARY
GLUCOSE-CAPILLARY: 109 mg/dL — AB (ref 70–99)
GLUCOSE-CAPILLARY: 95 mg/dL (ref 70–99)
GLUCOSE-CAPILLARY: 96 mg/dL (ref 70–99)
Glucose-Capillary: 104 mg/dL — ABNORMAL HIGH (ref 70–99)
Glucose-Capillary: 99 mg/dL (ref 70–99)
Glucose-Capillary: 120 mg/dL — ABNORMAL HIGH (ref 70–99)

## 2014-05-31 LAB — BASIC METABOLIC PANEL
Anion gap: 4 — ABNORMAL LOW (ref 5–15)
BUN: 5 mg/dL — ABNORMAL LOW (ref 6–23)
CHLORIDE: 111 meq/L (ref 96–112)
CO2: 24 mmol/L (ref 19–32)
Calcium: 8.8 mg/dL (ref 8.4–10.5)
Creatinine, Ser: 0.71 mg/dL (ref 0.50–1.10)
GFR calc Af Amer: >90 mL/min (ref >90)
GFR calc non Af Amer: >90 mL/min (ref >90)
GLUCOSE: 104 mg/dL — AB (ref 70–99)
Potassium: 3.4 mmol/L — ABNORMAL LOW (ref 3.5–5.1)
SODIUM: 139 mmol/L (ref 135–145)

## 2014-05-31 LAB — CBC WITH DIFFERENTIAL/PLATELET
BASOS ABS: 0 10*3/uL (ref 0.0–0.1)
BASOS PCT: 0 % (ref 0–1)
Eosinophils Absolute: 0.2 10*3/uL (ref 0.0–0.7)
Eosinophils Relative: 1 % (ref 0–5)
HCT: 32.6 % — ABNORMAL LOW (ref 36.0–46.0)
HEMOGLOBIN: 10.8 g/dL — AB (ref 12.0–15.0)
LYMPHS PCT: 21 % (ref 12–46)
Lymphs Abs: 3.2 10*3/uL (ref 0.7–4.0)
MCH: 29.6 pg (ref 26.0–34.0)
MCHC: 33.1 g/dL (ref 30.0–36.0)
MCV: 89.3 fL (ref 78.0–100.0)
Monocytes Absolute: 0.9 10*3/uL (ref 0.1–1.0)
Monocytes Relative: 6 % (ref 3–12)
NEUTROS ABS: 11 10*3/uL — AB (ref 1.7–7.7)
Neutrophils Relative %: 72 % (ref 43–77)
PLATELETS: 395 10*3/uL (ref 150–400)
RBC: 3.65 MIL/uL — AB (ref 3.87–5.11)
RDW: 14.2 % (ref 11.5–15.5)
WBC: 15.3 10*3/uL — AB (ref 4.0–10.5)

## 2014-05-31 LAB — PHOSPHORUS: PHOSPHORUS: 2.9 mg/dL (ref 2.3–4.6)

## 2014-05-31 LAB — MAGNESIUM: Magnesium: 2 mg/dL (ref 1.5–2.5)

## 2014-05-31 LAB — HOMOCYSTEINE: Homocysteine: 8.1 umol/L (ref 0.0–15.0)

## 2014-05-31 LAB — CLOSTRIDIUM DIFFICILE BY PCR: CDIFFPCR: NEGATIVE

## 2014-05-31 MED ORDER — PANTOPRAZOLE SODIUM 40 MG PO TBEC
40.0000 mg | DELAYED_RELEASE_TABLET | Freq: Every day | ORAL | Status: DC
  Administered 2014-05-31 – 2014-06-04 (×5): 40 mg via ORAL
  Filled 2014-05-31 (×6): qty 1

## 2014-05-31 MED ORDER — ENOXAPARIN SODIUM 40 MG/0.4ML ~~LOC~~ SOLN
40.0000 mg | Freq: Two times a day (BID) | SUBCUTANEOUS | Status: DC
  Administered 2014-05-31 – 2014-06-04 (×9): 40 mg via SUBCUTANEOUS
  Filled 2014-05-31 (×12): qty 0.4

## 2014-05-31 MED ORDER — SODIUM CHLORIDE 0.9 % IV SOLN: INTRAVENOUS | Status: DC

## 2014-05-31 MED ORDER — POTASSIUM CHLORIDE CRYS ER 20 MEQ PO TBCR
20.0000 meq | EXTENDED_RELEASE_TABLET | Freq: Two times a day (BID) | ORAL | Status: DC
  Administered 2014-05-31 – 2014-06-04 (×9): 20 meq via ORAL
  Filled 2014-05-31 (×10): qty 1

## 2014-05-31 MED ORDER — ACETAMINOPHEN 325 MG PO TABS
650.0000 mg | ORAL_TABLET | ORAL | Status: DC | PRN
  Administered 2014-05-31 – 2014-06-01 (×3): 650 mg via ORAL
  Filled 2014-05-31 (×4): qty 2

## 2014-05-31 MED ORDER — LOPERAMIDE HCL 2 MG PO CAPS
2.0000 mg | ORAL_CAPSULE | ORAL | Status: DC | PRN
  Administered 2014-05-31 (×3): 2 mg via ORAL
  Filled 2014-05-31 (×3): qty 1

## 2014-05-31 MED ORDER — POTASSIUM CHLORIDE 20 MEQ PO PACK: 20.0000 meq | PACK | Freq: Two times a day (BID) | ORAL | Status: DC

## 2014-05-31 NOTE — Evaluation (Signed)
Physical Therapy Evaluation Patient Details Name: Rebecca Russo MRN: [id number] DOB: 09-16-1975 Today's Date: 05/31/2014   History of Present Illness  39 y.o. female admitted to [facility] on 05/29/14 after being found on the ground at home (fell out of bed) with right sided weakness and left gaze preference.  CT revealed, "Subtle CT changes of left MCA territory infarct, most apparent at the insula, with no associated hemorrhage or mass effect. No new intracranial abnormality."  Pt with significant PMHx of obesity.    Clinical Impression  Pt is mobilizing well with no current signs of functional weakness in her right leg.  Right hand has weak grip with decreased coordination compared to the left.  She also had significant increase in DOE with gait 3/4 on RA despite O2 sats in the 90s.  She seemed to indicate that she gets SOB when she walks normally.  Also, HR into 120-140 range during gait up from 89 at beginning of session at rest.  RN made aware.  Stairs deferred due to high HR and DOE with gait.   PT to follow acutely for deficits listed below.       Follow Up Recommendations No PT follow up    Equipment Recommendations  None recommended by PT    Recommendations for Other Services   NA    Precautions / Restrictions   None     Mobility  Bed Mobility Overal bed mobility: Modified Independent             General bed mobility comments: with HOB ~30 degrees and using the bed rail for leverage to pull trunk up to sitting.   Transfers Overall transfer level: Needs assistance Equipment used: 1 person hand held assist Transfers: Sit to/from [employer] Sit to Stand: Min guard Stand pivot transfers: Min guard       General transfer comment: Min guard assist for safety and balance during first transfer.   Ambulation/Gait Ambulation/Gait assistance: Min guard Ambulation Distance (Feet): 100 Feet Assistive device: 1 person hand held assist Gait Pattern/deviations:  Step-through pattern;Staggering left;Staggering right Gait velocity: decreased Gait velocity interpretation: Below normal speed for age/gender General Gait Details: Pt with mildly staggering gait pattern, min guard assist for safety and balance.  Pt with increased DOE during gait 3/4 requiring a seated rest break.  HR increased to 120-140 during gait (started in the upper 80s) RN aware as telemetry was calling.  Seated rest break due to dyspnea 1/2 way through gait, but subsequently needed due to high HR.    Stairs Stairs:  (deferred today)              Modified Rankin (Stroke Patients Only) Modified Rankin (Stroke Patients Only) Pre-Morbid Rankin Score: No symptoms Modified Rankin: Moderately severe disability     Balance Overall balance assessment: Needs assistance Sitting-balance support: Feet supported;No upper extremity supported Sitting balance-Leahy Scale: Good     Standing balance support: No upper extremity supported;Single extremity supported Standing balance-Leahy Scale: Good                               Pertinent Vitals/Pain Pain Assessment: No/denies pain    Home Living Family/patient expects to be discharged to:: Private residence Living Arrangements: Children (three) Available Help at Discharge: Family;Available 24 hours/day Type of Home: House Home Access: Stairs to enter Entrance Stairs-Rails: None Entrance Stairs-Number of Steps: 5 Home Layout: One level Home Equipment: Cane - single point;Bedside commode;Shower  seat - built in      Prior Function Level of Independence: Independent               Hand Dominance   Dominant Hand: Right    Extremity/Trunk Assessment   Upper Extremity Assessment: RUE deficits/detail RUE Deficits / Details: weak grip and decreased coordination.          Lower Extremity Assessment: RLE deficits/detail RLE Deficits / Details: right leg seems WFL per seated MMT and gait assessment.  No  buckling noted with flat surface gait.  Functionally, we will see how she does on the stairs. Difficult to say if she has any sensory changes as she at times had difficulty with yes/no questions.     Cervical / Trunk Assessment: Normal  Communication   Communication: Expressive difficulties  Cognition     Overall Cognitive Status: Difficult to assess                               Assessment/Plan    PT Assessment Patient needs continued PT services  PT Diagnosis Difficulty walking;Abnormality of gait;Hemiplegia dominant side;Altered mental status   PT Problem List Decreased strength;Decreased activity tolerance;Decreased balance;Decreased mobility;Decreased knowledge of use of DME;Cardiopulmonary status limiting activity;Obesity  PT Treatment Interventions DME instruction;Gait training;Stair training;Therapeutic activities;Functional mobility training;Therapeutic exercise;Balance training;Neuromuscular re-education;Cognitive remediation;Patient/family education   PT Goals (Current goals can be found in the Care Plan section) Acute Rehab PT Goals Patient Stated Goal: to get her speech back PT Goal Formulation: With patient Time For Goal Achievement: 06/14/14 Potential to Achieve Goals: Good    Frequency Min 4X/week    End of Session   Activity Tolerance: Patient limited by fatigue;Treatment limited secondary to medical complications (Comment) (limited by DOE and high HR) Patient left: in chair;with call bell/phone within reach;with chair alarm set Nurse Communication: Mobility status;Other (comment) (high HR and DOE with gait)         Time: 4765-4650 PT Time Calculation (min) (ACUTE ONLY): 12 min   Charges:   PT Evaluation $Initial PT Evaluation Tier I: 1 Procedure          Hubert Raatz B. Asja Frommer, PT, DPT [phone]   05/31/2014, 6:33 PM

## 2014-05-31 NOTE — Progress Notes (Signed)
Stroke Team Progress Note  HISTORY Rebecca Russo is an 39 y.o. female who went to sleep last night at around 2200 hours which is the last time she seen normal. This AM at 0800 hours she was heard falling out of her bed. Her son came into the room and found her on the floor in her PJ's with right arm and leg flaccidity and left gaze deviation. Patient was brought to CT scan where a left MCA sign was visualized. Due to age and likely a wake up stroke a CT perfusion was obtained showing large area of viable brain. Patient was brought straight to IR.   Date last known well: Date: 05/28/2013 Time last known well: Time: 22:00 tPA Given: No: out of window  Family is at bedside, patient is alert, cooperative, and aphasic.   OBJECTIVE Most recent Vital Signs: Filed Vitals:   05/31/14 0600 05/31/14 0700 05/31/14 0757 05/31/14 0800  BP: 149/86 114/54  107/67  Pulse: 82 80  91  Temp:   97.7 F (36.5 C)   TempSrc:   Oral   Resp: Height:      Weight:      SpO2: 100% 99%  100%   CBG (last 3)   Recent Labs  05/30/14 2020 05/30/14 2340 05/31/14 0334  GLUCAP 99 96 104*    IV Fluid Intake:   . sodium chloride 1,000 mL (05/31/14 0314)  . niCARDipine Stopped (05/30/14 1730)  . propofol Stopped (05/30/14 0830)    MEDICATIONS  . antiseptic oral rinse  7 mL Mouth Rinse q12n4p  . aspirin  325 mg Oral Daily  .  ceFAZolin (ANCEF) IV  2 g Intravenous 3 times per day  . chlorhexidine  15 mL Mouth Rinse BID  . pantoprazole (PROTONIX) IV  40 mg Intravenous Q24H  . rosuvastatin  5 mg Oral q1800   PRN:  acetaminophen **OR** acetaminophen, fentaNYL, ondansetron (ZOFRAN) IV, senna-docusate  Diet:  DIET DYS 2 Activity:  Up as tolerated DVT Prophylaxis:  lovenox  CLINICALLY SIGNIFICANT STUDIES Basic Metabolic Panel:   Recent Labs Lab 05/30/14 0345 05/31/14 0235  NA 137 139  K 3.5 3.4*  CL 109 111  CO2 22 24  GLUCOSE 109* 104*  BUN 6 5*  CREATININE 0.67 0.71  CALCIUM 8.4  8.8  MG 1.8 2.0  PHOS 2.9 2.9   Liver Function Tests:   Recent Labs Lab 05/29/14 0900  AST 29  ALT 22  ALKPHOS 73  BILITOT 0.4  PROT 7.2  ALBUMIN 3.5   CBC:   Recent Labs Lab 05/30/14 0345 05/31/14 0235  WBC 16.9* 15.3*  NEUTROABS 13.2* 11.0*  HGB 10.5* 10.8*  HCT 31.7* 32.6*  MCV 89.0 89.3  PLT 389 395   Coagulation:   Recent Labs Lab 05/29/14 0900  LABPROT 13.5  INR 1.02   Cardiac Enzymes: No results for input(s): CKTOTAL, CKMB, CKMBINDEX, TROPONINI in the last 168 hours. Urinalysis:   Recent Labs Lab 05/29/14 0942  COLORURINE YELLOW  LABSPEC 1.029  PHURINE 5.0  GLUCOSEU NEGATIVE  HGBUR NEGATIVE  BILIRUBINUR NEGATIVE  KETONESUR NEGATIVE  PROTEINUR NEGATIVE  UROBILINOGEN 0.2  NITRITE NEGATIVE  LEUKOCYTESUR NEGATIVE   Lipid Panel    Component Value Date/Time   CHOL 173 05/30/2014 1200   TRIG 225* 05/30/2014 1200   HDL 31* 05/30/2014 1200   CHOLHDL 5.6 05/30/2014 1200   VLDL 45* 05/30/2014 1200   LDLCALC 97 05/30/2014 1200   HgbA1C  Lab Results  Component  Value Date   HGBA1C 5.6 05/30/2014    Urine Drug Screen:      Component Value Date/Time   LABOPIA NONE DETECTED 05/29/2014 0942   COCAINSCRNUR NONE DETECTED 05/29/2014 0942   LABBENZ NONE DETECTED 05/29/2014 0942   AMPHETMU NONE DETECTED 05/29/2014 0942   THCU NONE DETECTED 05/29/2014 0942   LABBARB NONE DETECTED 05/29/2014 0942    Alcohol Level:   Recent Labs Lab 05/29/14 0900  ETH <5    Ct Head Wo Contrast  05/30/2014   CLINICAL DATA:  39 year old female status post code stroke with asymmetric left ICA terminus hyperdensity and marrow intervention. Initial encounter.  EXAM: CT HEAD WITHOUT CONTRAST  TECHNIQUE: Contiguous axial images were obtained from the base of the skull through the vertex without intravenous contrast.  COMPARISON:  Head CTs without contrast 05/29/2014, and earlier.  FINDINGS: Increased paranasal sinus fluid in opacification. Mastoids remain clear. No  acute osseous abnormality identified.  Visualized orbits and scalp soft tissues are within normal limits.  Subtle hypodensity at the lateral left lentiform nuclei and insula. Indistinct gray-white matter differentiation in this region (series 21, image 15). Subtle increased white matter hypodensity at the left centrum semiovale (image 20). Associated loss of sulci. Still, no significant mass effect on the left lateral ventricle. No midline shift. No acute intracranial hemorrhage identified. Stable gray-white matter differentiation elsewhere. The ICA termini now have symmetric density.  IMPRESSION: Subtle CT changes of left MCA territory infarct, most apparent at the insula, with no associated hemorrhage or mass effect. No new intracranial abnormality.   Electronically Signed   By: Augusto Gamble M.D.   On: 05/30/2014 17:03   Ct Head Wo Contrast  05/29/2014   CLINICAL DATA:  Subsequent encounter for stroke.  EXAM: CT HEAD WITHOUT CONTRAST  TECHNIQUE: Contiguous axial images were obtained from the base of the skull through the vertex without intravenous contrast.  COMPARISON:  Earlier the same day.  FINDINGS: There is no evidence for acute hemorrhage, hydrocephalus, mass lesion, or abnormal extra-axial fluid collection. Cortical sulcation in the left hemisphere is decreased compared to the right raising the question of underlying diffuse hemispheric edema. No midline shift.  The visualized paranasal sinuses and mastoid air cells are clear.  IMPRESSION: Subtle decreased sulcation in the left cerebral hemisphere raising the question for diffuse edema. No evidence for acute intracranial hemorrhage.   Electronically Signed   By: Kennith Center M.D.   On: 05/29/2014 14:16   Ct Head Wo Contrast  05/29/2014   CLINICAL DATA:  Code stroke. RIGHT-sided weakness and neglect. Minimal speech.  EXAM: CT HEAD WITHOUT CONTRAST  TECHNIQUE: Contiguous axial images were obtained from the base of the skull through the vertex without  intravenous contrast.  COMPARISON:  CT head 11/28/2005.  FINDINGS: The patient was unable to remain motionless for the exam. Small or subtle lesions could be overlooked.  The initial motion degraded exam was repeated with patient's head turned to the LEFT. No definite acute stroke or hemorrhage. No mass lesion or hydrocephalus. No extra-axial fluid.  Asymmetric hyperattenuation of the LEFT ICA in its cavernous and supraclinoid segments consistent with large vessel occlusion. LEFT MCA not well visualized but may be minimally hyper attenuating along its M1 course. Calvarium intact.  IMPRESSION: Motion degraded exam showing no definite acute stroke or hemorrhage. Hyperdense LEFT ICA suggesting proximal large vessel occlusion  Critical Value/emergent results were called by telephone at the time of interpretation on 05/29/2014 at 9:16 am to Dr. Noel Christmas , who  verbally acknowledged these results.   Electronically Signed   By: Davonna Belling M.D.   On: 05/29/2014 09:22   Ct Cerebral Perfusion W/cm  05/29/2014   CLINICAL DATA:  New onset of RIGHT-sided weakness and neglect. Speech difficulty. Symptoms began earlier today.  EXAM: CT CEREBRAL PERFUSION WITH CONTRAST  TECHNIQUE: Dynamic scanning through the brain at 16 separate axial slice locations per performed during bolus infusion of Omnipaque.  CONTRAST:  40mL OMNIPAQUE IOHEXOL 350 MG/ML SOLN  COMPARISON:  Noncontrast CT head earlier in the day.  FINDINGS: The LEFT hemisphere is abnormal.  There is markedly increased mean transit time and time to peak perfusion throughout much of the LEFT frontal, LEFT temporal, and anterior parietal lobes as well as the LEFT basal ganglia. Involvement of the LEFT medial frontal cortex suggests ACA and MCA involvement.  Cerebral blood flow is markedly reduced within the above named territories. Cerebral blood volume is modestly diminished, particularly over the convexity, but not to the degree of abnormality seen on cerebral blood  flow.  IMPRESSION: There could be a moderate-sized ischemic penumbra in the LEFT hemisphere in this patient with suspected LEFT ICA occlusion.  At the time of dictation, the patient is in interventional radiology in preparation for possible thrombolysis.  Findings discussed with ordering provider approximately 9:55 a.m.   Electronically Signed   By: Davonna Belling M.D.   On: 05/29/2014 10:08   Dg Chest Port 1 View  05/30/2014   CLINICAL DATA:  Shortness of breath. Stroke. Acute respiratory failure. On ventilator.  EXAM: PORTABLE CHEST - 1 VIEW  COMPARISON:  None.  FINDINGS: Endotracheal tube and nasogastric tube are seen in appropriate position. Low lung volumes are noted with mild atelectasis in right lung base. No evidence of pulmonary consolidation or edema. No evidence of pleural effusion pneumothorax. Heart size is normal.  IMPRESSION: Low lung volumes with mild right basilar atelectasis.   Electronically Signed   By: Myles Rosenthal M.D.   On: 05/30/2014 08:27   Dg Abd Portable 1v  05/29/2014   CLINICAL DATA:  Bedside orogastric tube placement.  EXAM: PORTABLE ABDOMEN - 1 VIEW  COMPARISON:  Two-view abdomen x-ray 09/04/2007.  FINDINGS: OG tube tip in the proximal body of the stomach. Gas within multiple loops of upper normal caliber small bowel throughout the abdomen. Moderate rectal stool burden. Right femoral vascular sheath. Surgical clips in a right upper quadrant from prior cholecystectomy. Regional skeleton unremarkable.  IMPRESSION: 1. OG tube tip in the proximal body of the stomach. 2. Early/incipient small bowel ileus.   Electronically Signed   By: Hulan Saas M.D.   On: 05/29/2014 16:06    CT of the brain   IMPRESSION: Subtle decreased sulcation in the left cerebral hemisphere raising the question for diffuse edema. No evidence for acute intracranial Hemorrhage.   CT head 05/30/14:  IMPRESSION: Subtle CT changes of left MCA territory infarct, most apparent at the insula, with no  associated hemorrhage or mass effect. No new intracranial abnormality.  MRI of the brain  Patient refused  MRA of the brain    Carotid Doppler  Cerebral angiogram done  2D Echocardiogram  pending  CXR   IMPRESSION: Low lung volumes with mild right basilar atelectasis.  EKG  Sinus rhythm Borderline T wave abnormalities  Therapy Recommendations pending, ST has seen    Blood pressure 107/67, pulse 91, temperature 97.7 F (36.5 C), temperature source Oral, resp. rate 19, height 5\' 6"  (1.676 m), weight 344 lb 12.8 oz (  156.4 kg), SpO2 100 %.  Physical Exam  General: The patient is alert and cooperative at the time of the examination. The patient is morbidly obese.  Respiratory: Lung fields are clear, with the exception of occasional posterior rhonchi  Cardiovascular: Regular rate and rhythm, no obvious murmurs or rubs  Abdomen: Obese, nontender, positive bowel sounds  Skin: No significant peripheral edema is noted.   Neurologic Exam  Mental status: The patient is alert and cooperative, aphasic.  Cranial nerves: Facial symmetry is not present, there is depression of the right nasolabial fold. There is apraxia with the use of the tongue. Speech is aphasic, minimal verbal output, not following all verbal commands well. Extraocular movements are full. Visual fields are full.  Motor: The patient has good strength in all 4 extremities.  Sensory examination: Patient indicates that there is good symmetry was soft touch on the face, arms, and legs.  Coordination: The cerebellar testing was unremarkable with finger-nose-finger, and toe to finger bilaterally.  Gait and station: The patient was not ambulated.  Reflexes: Deep tendon reflexes are symmetric, but are depressed throughout.    ASSESSMENT [name] is a 39 y.o. female presenting with right hemiparesis. The patient did not receive TPA secondary to unknown time of onset. Perfusion scan revealed left brain at  risk, the patient underwent clot extraction with good success. The patient has regained function of all 4 extremities, following verbal commands. The patient was on no medications prior to admission. On no aspirin. Hypercoagulable state workup has been ordered. The patient is currently intubated, critical care medicine following.   Left brain stroke, left carotid occlusion, status post clot extraction  LDL 89  Obesity   Hospital day # 2   The patient is doing quite well following the interventional procedure. She remains aphasic, but she has no significant hemiparesis. She is now taking po food and fluids. ST has seen. Plan to get to floor today and mobilize.  TREATMENT/PLAN  Aspirin therapy   Low-dose Crestor  Critical care medicine following, possible extubation yesterday.  Physical, occupational, and speech therapy to evaluate   Mobilize patient   Lovenox therapy   Hypercoagulable state workup in progress  Transfer to floor today  2D echo is pending    SIGNED  Lesly Dukes [phone]  To contact Stroke Continuity provider, please refer to [email or web address]. After hours, contact General Neurology

## 2014-05-31 NOTE — Progress Notes (Signed)
[facility] ADULT ICU REPLACEMENT PROTOCOL FOR AM LAB REPLACEMENT ONLY  The patient does not apply for the [facility] Adult ICU Electrolyte Replacment Protocol based on the criteria listed below:    Is urine output >/= 0.5 ml/kg/hr for the last 6 hours? No. Patient's UOP is unknown   Abnormal electrolyte(s):K3.4   If a panic level lab has been reported, has the CCM MD in charge been notified? Yes.  .   Physician:  E Deterding,MD  [place] Nakayama 05/31/2014 5:12 AM

## 2014-05-31 NOTE — Progress Notes (Signed)
PULMONARY / CRITICAL CARE MEDICINE   Name: Rebecca Russo MRN: [id number] DOB: 10-08-1975    ADMISSION DATE:  05/29/2014 CONSULTATION DATE:  05/29/2013  REFERRING MD :  Dr. Corliss Skains  CHIEF COMPLAINT:  Acute CVA and respiratory failure.  INITIAL PRESENTATION: 39 year old morbidly obese woman who was last seen normal at 10 PM on 05/28/2014 who was heard fall out of bed on 1/15 and EMS was called.  Patient was noted to have right arm flaccidity and left gaze.  CT was done and she was noted to have a potential for viable tissue and was taken to IR.  In IR the patient had a clot retrieval and was sent to the ICU.  PCCM was consulted for vent management.  STUDIES:  1/15 acute left sided MCA CVA, ischemic.  SIGNIFICANT EVENTS: 1/15 acute left sided MCA CVA, ischemic.  SUBJECTIVE: Alert, interactive and following commands.  VITAL SIGNS: Temp:  [97.2 F (36.2 C)-99.4 F (37.4 C)] 97.7 F (36.5 C) (01/17 0757) Pulse Rate:  [80-111] 91 (01/17 0800) Resp:  [13-32] 19 (01/17 0800) BP: (97-149)/(41-93) 107/67 mmHg (01/17 0800) SpO2:  [97 %-100 %] 100 % (01/17 0800) Arterial Line BP: (136-154)/(68-86) 149/76 mmHg (01/16 1815)  HEMODYNAMICS:    VENTILATOR SETTINGS:    INTAKE / OUTPUT:  Intake/Output Summary (Last 24 hours) at 05/31/14 1033 Last data filed at 05/31/14 0800  Gross per 24 hour  Intake 2436.16 ml  Output   1255 ml  Net 1181.16 ml   PHYSICAL EXAMINATION: General:  Morbidly obese female, awake and moving all ext, using her voice this AM. Neuro:  Awake, moving all ext to command.  Speaking in single words sentences throat hurts. HEENT:  [place]/AT, PERRL, EOM-I and MMM. Cardiovascular:  RRR, Nl S1/S2, -M/R/G. Lungs:  CTA bilaterally but very distant. Abdomen:  Soft, NT, ND and +BS. Musculoskeletal:  -edema and -tenderness. Skin:  1+ edema.  LABS:  CBC  Recent Labs Lab 05/29/14 0900 05/29/14 0905 05/30/14 0345 05/31/14 0235  WBC 10.6*  --  16.9* 15.3*  HGB 11.9*  12.9 10.5* 10.8*  HCT 35.6* 38.0 31.7* 32.6*  PLT 385  --  389 395   Coag's  Recent Labs Lab 05/29/14 0900  APTT 29  INR 1.02   BMET  Recent Labs Lab 05/29/14 0900 05/29/14 0905 05/30/14 0345 05/31/14 0235  NA 139 139 137 139  K 4.0 4.0 3.5 3.4*  CL 107 107 109 111  CO2 21  --  22 24  BUN 10 11 6  5*  CREATININE 0.76 0.70 0.67 0.71  GLUCOSE 114* 113* 109* 104*   Electrolytes  Recent Labs Lab 05/29/14 0900 05/30/14 0345 05/31/14 0235  CALCIUM 9.3 8.4 8.8  MG  --  1.8 2.0  PHOS  --  2.9 2.9   Sepsis Markers No results for input(s): LATICACIDVEN, PROCALCITON, O2SATVEN in the last 168 hours. ABG  Recent Labs Lab 05/29/14 1356 05/30/14 0500  PHART 7.320* 7.390  PCO2ART 43.2 35.3  PO2ART 360.0* 104.0*   Liver Enzymes  Recent Labs Lab 05/29/14 0900  AST 29  ALT 22  ALKPHOS 73  BILITOT 0.4  ALBUMIN 3.5   Cardiac Enzymes No results for input(s): TROPONINI, PROBNP in the last 168 hours. Glucose  Recent Labs Lab 05/30/14 1220 05/30/14 1812 05/30/14 2020 05/30/14 2340 05/31/14 0334 05/31/14 0758  GLUCAP 111* 108* 99 96 104* 99    Imaging Ct Head Wo Contrast  05/30/2014   CLINICAL DATA:  39 year old female status post code  stroke with asymmetric left ICA terminus hyperdensity and marrow intervention. Initial encounter.  EXAM: CT HEAD WITHOUT CONTRAST  TECHNIQUE: Contiguous axial images were obtained from the base of the skull through the vertex without intravenous contrast.  COMPARISON:  Head CTs without contrast 05/29/2014, and earlier.  FINDINGS: Increased paranasal sinus fluid in opacification. Mastoids remain clear. No acute osseous abnormality identified.  Visualized orbits and scalp soft tissues are within normal limits.  Subtle hypodensity at the lateral left lentiform nuclei and insula. Indistinct gray-white matter differentiation in this region (series 21, image 15). Subtle increased white matter hypodensity at the left centrum semiovale  (image 20). Associated loss of sulci. Still, no significant mass effect on the left lateral ventricle. No midline shift. No acute intracranial hemorrhage identified. Stable gray-white matter differentiation elsewhere. The ICA termini now have symmetric density.  IMPRESSION: Subtle CT changes of left MCA territory infarct, most apparent at the insula, with no associated hemorrhage or mass effect. No new intracranial abnormality.   Electronically Signed   By: Augusto Gamble M.D.   On: 05/30/2014 17:03   Dg Chest Port 1 View  05/30/2014   CLINICAL DATA:  Shortness of breath. Stroke. Acute respiratory failure. On ventilator.  EXAM: PORTABLE CHEST - 1 VIEW  COMPARISON:  None.  FINDINGS: Endotracheal tube and nasogastric tube are seen in appropriate position. Low lung volumes are noted with mild atelectasis in right lung base. No evidence of pulmonary consolidation or edema. No evidence of pleural effusion pneumothorax. Heart size is normal.  IMPRESSION: Low lung volumes with mild right basilar atelectasis.   Electronically Signed   By: Myles Rosenthal M.D.   On: 05/30/2014 08:27   ASSESSMENT / PLAN:  PULMONARY OETT 1/15>>> A: Acute respiratory failure due to acute CVA. P:   - Titrate O2 for sats. - IS and flutter valve. - Ambulate.  CARDIOVASCULAR CVL None A: HTN likely related to CVA. P:  - BP control per neuro's recommendations.  RENAL A:  No active issues. P:   - BMET in AM. - Replace electrolytes as indicated.  GASTROINTESTINAL A:  No active issues. P:   - Dysphagia diet as ordered. - D/C TF. - Protonix as ordered.  HEMATOLOGIC A:  No active issues. P:  - Monitor CBC.  INFECTIOUS A:  No signs of active infection. P:   - Monitor fever curve and WBC off abx.  ENDOCRINE A:  No history of diabetes   P:   - Monitor CBGs on labs.  NEUROLOGIC A:  Acute ischemic left sided CVA, s/p IR procedure. P:   BP control per neuro. Further recommendations per neuro.  FAMILY  - Updates: No  family beside.  TODAY'S SUMMARY: 39 year old morbidly obese female presenting with acute left sided CVA, MCA distribution.  Extubated and doing well.  Passed swallow evaluation and eating.  PT/OT and OOB to chair.  Transfer to 4N, PCCM will sign off, please call back if needed.  Alyson Reedy, M.D. [facility] Pulmonary/Critical Care Medicine. Pager: [phone]. After hours pager: [phone].  05/31/2014, 10:33 AM

## 2014-06-01 ENCOUNTER — Inpatient Hospital Stay (HOSPITAL_COMMUNITY): Payer: Managed Care, Other (non HMO)

## 2014-06-01 ENCOUNTER — Encounter (HOSPITAL_COMMUNITY): Payer: Self-pay | Admitting: Nurse Practitioner

## 2014-06-01 DIAGNOSIS — E785 Hyperlipidemia, unspecified: Secondary | ICD-10-CM

## 2014-06-01 DIAGNOSIS — I519 Heart disease, unspecified: Secondary | ICD-10-CM

## 2014-06-01 LAB — CBC WITH DIFFERENTIAL/PLATELET
BASOS PCT: 0 % (ref 0–1)
Basophils Absolute: 0 10*3/uL (ref 0.0–0.1)
Eosinophils Absolute: 0.4 10*3/uL (ref 0.0–0.7)
Eosinophils Relative: 3 % (ref 0–5)
HCT: 34.4 % — ABNORMAL LOW (ref 36.0–46.0)
HEMOGLOBIN: 11.4 g/dL — AB (ref 12.0–15.0)
LYMPHS ABS: 3.7 10*3/uL (ref 0.7–4.0)
Lymphocytes Relative: 23 % (ref 12–46)
MCH: 29.9 pg (ref 26.0–34.0)
MCHC: 33.1 g/dL (ref 30.0–36.0)
MCV: 90.3 fL (ref 78.0–100.0)
Monocytes Absolute: 0.9 10*3/uL (ref 0.1–1.0)
Monocytes Relative: 6 % (ref 3–12)
Neutro Abs: 11 10*3/uL — ABNORMAL HIGH (ref 1.7–7.7)
Neutrophils Relative %: 68 % (ref 43–77)
Platelets: 428 10*3/uL — ABNORMAL HIGH (ref 150–400)
RBC: 3.81 MIL/uL — ABNORMAL LOW (ref 3.87–5.11)
RDW: 14.3 % (ref 11.5–15.5)
WBC: 16.1 10*3/uL — AB (ref 4.0–10.5)

## 2014-06-01 LAB — PROTEIN C, TOTAL: PROTEIN C, TOTAL: 110 % (ref 70–140)

## 2014-06-01 LAB — BASIC METABOLIC PANEL
ANION GAP: 5 (ref 5–15)
BUN: 10 mg/dL (ref 6–23)
CHLORIDE: 113 meq/L — AB (ref 96–112)
CO2: 21 mmol/L (ref 19–32)
CREATININE: 0.83 mg/dL (ref 0.50–1.10)
Calcium: 9.5 mg/dL (ref 8.4–10.5)
GFR calc Af Amer: >90 mL/min (ref >90)
GFR calc non Af Amer: 88 mL/min — ABNORMAL LOW (ref >90)
Glucose, Bld: 107 mg/dL — ABNORMAL HIGH (ref 70–99)
POTASSIUM: 3.7 mmol/L (ref 3.5–5.1)
SODIUM: 139 mmol/L (ref 135–145)

## 2014-06-01 LAB — BETA-2-GLYCOPROTEIN I ABS, IGG/M/A
BETA 2 GLYCO I IGG: 14 G Units (ref <20)
Beta-2-Glycoprotein I IgA: 6 A Units (ref <20)
Beta-2-Glycoprotein I IgM: 6 M Units (ref <20)

## 2014-06-01 LAB — GLUCOSE, CAPILLARY
GLUCOSE-CAPILLARY: 110 mg/dL — AB (ref 70–99)
GLUCOSE-CAPILLARY: 104 mg/dL — AB (ref 70–99)
Glucose-Capillary: 98 mg/dL (ref 70–99)

## 2014-06-01 LAB — CARDIOLIPIN ANTIBODIES, IGG, IGM, IGA
Anticardiolipin IgA: 10 APL U/mL — ABNORMAL LOW (ref <22)
Anticardiolipin IgG: 3 GPL U/mL — ABNORMAL LOW (ref <23)
Anticardiolipin IgM: 4 MPL U/mL — ABNORMAL LOW (ref <11)

## 2014-06-01 LAB — LUPUS ANTICOAGULANT PANEL
DRVVT: 35 secs (ref <42.9)
Lupus Anticoagulant: NOT DETECTED
PTT LA: 34.9 s (ref 28.0–43.0)

## 2014-06-01 LAB — PROTEIN S ACTIVITY: Protein S Activity: 113 % (ref 60–145)

## 2014-06-01 LAB — PROTEIN C ACTIVITY: Protein C Activity: 174 % — ABNORMAL HIGH (ref 74–151)

## 2014-06-01 LAB — PROTEIN S, TOTAL: PROTEIN S AG TOTAL: 128 % (ref 58–150)

## 2014-06-01 MED ORDER — BUTALBITAL-APAP-CAFFEINE 50-325-40 MG PO TABS
2.0000 | ORAL_TABLET | ORAL | Status: DC | PRN
  Administered 2014-06-01 – 2014-06-03 (×4): 2 via ORAL
  Filled 2014-06-01 (×4): qty 2

## 2014-06-01 MED ORDER — BUTALBITAL-APAP-CAFFEINE 50-325-40 MG PO TABS
1.0000 | ORAL_TABLET | ORAL | Status: DC | PRN
  Administered 2014-06-01: 1 via ORAL
  Filled 2014-06-01: qty 1

## 2014-06-01 MED ORDER — IOHEXOL 350 MG/ML SOLN
80.0000 mL | Freq: Once | INTRAVENOUS | Status: AC | PRN
  Administered 2014-06-01: 80 mL via INTRAVENOUS

## 2014-06-01 MED ORDER — RESOURCE THICKENUP CLEAR PO POWD
ORAL | Status: DC | PRN
  Filled 2014-06-01: qty 125

## 2014-06-01 NOTE — Evaluation (Signed)
Occupational Therapy Evaluation Patient Details Name: Rebecca Russo MRN: [id number] DOB: 05/30/1975 Today's Date: 06/01/2014    History of Present Illness 39 y.o. female admitted to [facility] on 05/29/14 after being found on the ground at home (fell out of bed) with right sided weakness and left gaze preference.  CT revealed, "Subtle CT changes of left MCA territory infarct, most apparent at the insula, with no associated hemorrhage or mass effect. No new intracranial abnormality."  Pt with significant PMHx of obesity.     Clinical Impression   Patient independent PTA. Patient currently functioning at an overall independent level. D/C from acute OT services and no additional follow-up OT needs at this time. All appropriate education provided to patient and family. Please re-order OT as needed.    Follow Up Recommendations  No OT follow up    Equipment Recommendations  None recommended by OT (Patient has a BSC and built in shower seat)    Recommendations for Other Services  None at this time     Precautions / Restrictions Precautions Precautions: Fall Restrictions Weight Bearing Restrictions: No      Mobility Bed Mobility Overal bed mobility: Modified Independent             General bed mobility comments: with HOB ~30 degrees and using the bed rail for leverage to pull trunk up to sitting.   Transfers Overall transfer level: Independent Equipment used: None Transfers: Sit to/from Stand Sit to Stand: Independent Stand pivot transfers: Independent            Balance Overall balance assessment: No apparent balance deficits (not formally assessed)     ADL Overall ADL's : At baseline;Independent     Vision Additional Comments: Patient's ROM was [facility] and patient able to see phone screen for typing  Per patient report, no change from baseline.          Pertinent Vitals/Pain Pain Assessment: No/denies pain   Hand Dominance Right   Extremity/Trunk Assessment Upper  Extremity Assessment Upper Extremity Assessment: RUE deficits/detail RUE Deficits / Details: Patient with decreased coordination, educated patient on coordination exercises. Overall strengh and grip strength is [facility]   Lower Extremity Assessment Lower Extremity Assessment: Defer to PT evaluation   Cervical / Trunk Assessment Cervical / Trunk Assessment: Normal   Communication Communication Communication: Expressive difficulties   Cognition Arousal/Alertness: Awake/alert Behavior During Therapy: WFL for tasks assessed/performed Overall Cognitive Status: Difficult to assess              Home Living Family/patient expects to be discharged to:: Private residence Living Arrangements: Children (two children; 16 & 21) Available Help at Discharge: Family;Available 24 hours/day Type of Home: House Home Access: Stairs to enter [employer] of Steps: 5 Entrance Stairs-Rails: None Home Layout: One level     Bathroom Shower/Tub: Walk-in Pensions consultant: Standard     Home Equipment: Cane - single point;Bedside commode;Shower seat - built in          Prior Functioning/Environment Level of Independence: Independent             OT Diagnosis: Generalized weakness   OT Problem List:  (n/a, no acute OT needs)            Barriers to D/C:  None known          End of Session    Activity Tolerance: Patient tolerated treatment well Patient left: in bed;with call bell/phone within reach (staff in room)   Time: [phone] OT  Time Calculation (min): 15 min Charges:  OT Evaluation $Initial OT Evaluation Tier I: 1 Procedure  Dekari Bures , MS, OTR/L, CLT Pager: [phone]  06/01/2014, 9:48 AM

## 2014-06-01 NOTE — Care Management Note (Signed)
    Page 1 of 1   06/03/2014     11:19:30 AM CARE MANAGEMENT NOTE 06/03/2014  Patient:  Rebecca Russo,Rebecca Russo   Account Number:  [id number]  Date Initiated:  06/01/2014  Documentation initiated by:  ROBARGE,COURTNEY  Subjective/Objective Assessment:   Patient was admitted with CVA.  Admitted from home with children     Action/Plan:   Will follow for discharge needs pending PT/OT evals and physician orders.   Anticipated DC Date:  06/03/2014   Anticipated DC Plan:  HOME W HOME HEALTH SERVICES      DC Planning Services  CM consult  OP Neuro Rehab      Choice offered to / List presented to:             Status of service:  In process, will continue to follow Medicare Important Message given?   (If response is "NO", the following Medicare IM given date fields will be blank) Date Medicare IM given:   Medicare IM given by:   Date Additional Medicare IM given:   Additional Medicare IM given by:    Discharge Disposition:    Per UR Regulation:  Reviewed for med. necessity/level of care/duration of stay  If discussed at Long Length of Stay Meetings, dates discussed:    Comments:  06/03/14 1030 Courtney Robarge RN, MSN, CM- Met with patient to discuss outpatient SLP. Patient is interested and has chosen [facility].  Information was faxed. CORINNE T. @ [employer] # [phone] was notified of discharge plan. Patient has requested that appointments be made with her mother Rebecca Russo [phone] or [phone]. This was noted on the fax.   

## 2014-06-01 NOTE — Progress Notes (Signed)
Physical Therapy Treatment Patient Details Name: Rebecca Russo MRN: [id number] DOB: [birth year] Today's Date: 06/01/2014    History of Present Illness 39 y.o. female admitted to [facility] on 05/29/14 after being found on the ground at home (fell out of bed) with right sided weakness and left gaze preference.  CT revealed, "Subtle CT changes of left MCA territory infarct, most apparent at the insula, with no associated hemorrhage or mass effect. No new intracranial abnormality."  Pt with significant PMHx of obesity.      PT Comments    PT. Has met all goals for d/c from acute care. Continue to recommend ambulation.  Follow Up Recommendations  No PT follow up     Equipment Recommendations  None recommended by PT    Recommendations for Other Services       Precautions / Restrictions Precautions Precautions: Fall Restrictions Weight Bearing Restrictions: No    Mobility  Bed Mobility Overal bed mobility: Modified Independent             General bed mobility comments: with HOB ~30 degrees and using the bed rail for leverage to pull trunk up to sitting.   Transfers Overall transfer level: Independent Equipment used: None Transfers: Sit to/from Stand Sit to Stand: Independent Stand pivot transfers: Independent       General transfer comment: able to turn and pivot, walk backwards to toilet.  Ambulation/Gait Ambulation/Gait assistance: Independent Ambulation Distance (Feet): 600 Feet Assistive device: None Gait Pattern/deviations: Step-through pattern Gait velocity: normal Gait velocity interpretation: at or above normal speed for age/gender General Gait Details: Pt. with no problems walking. Demonstrated no SOB. Checked her HR when she got back to the room 129bpm. Took a seat in her recliner.   Stairs   Stairs assistance: Independent Stair Management: Two rails Number of Stairs: 6 General stair comments: no problems  Wheelchair Mobility    Modified Rankin  (Stroke Patients Only) Modified Rankin (Stroke Patients Only) Pre-Morbid Rankin Score: No symptoms Modified Rankin: Moderately severe disability     Balance Overall balance assessment: No apparent balance deficits (not formally assessed) Sitting-balance support: Feet supported Sitting balance-Leahy Scale: Good     Standing balance support: No upper extremity supported;During functional activity Standing balance-Leahy Scale: Good                      Cognition Arousal/Alertness: Awake/alert Behavior During Therapy: WFL for tasks assessed/performed Overall Cognitive Status: Difficult to assess                      Exercises      General Comments        Pertinent Vitals/Pain Pain Assessment: No/denies pain    Home Living Family/patient expects to be discharged to:: Private residence Living Arrangements: Children (two children; 16 & 21) Available Help at Discharge: Family;Available 24 hours/day Type of Home: House Home Access: Stairs to enter Entrance Stairs-Rails: None Home Layout: One level Home Equipment: Cane - single point;Bedside commode;Shower seat - built in      Prior Function Level of Independence: Independent          PT Goals (current goals can now be found in the care plan section) Progress towards PT goals: Goals met/education completed, patient discharged from PT    Frequency       PT Plan      Co-evaluation             End of Session   Activity Tolerance: Patient  tolerated treatment well Patient left: in chair;with call bell/phone within reach;with family/visitor present;with nursing/sitter in room     Time: 1137-1200 PT Time Calculation (min) (ACUTE ONLY): 23 min  Charges:                       G Codes:      Jodi Geralds, SPTA 06/01/2014, 12:22 PM

## 2014-06-01 NOTE — Progress Notes (Signed)
Stroke Team Progress Note  HISTORY Naiomy L Flannigan is an 39 y.o. female who went to sleep last night 05/28/2014 at around 2200 hours which is the last time she seen normal. This AM at 0800 hours she was heard falling out of her bed. Her son came into the room and found her on the floor in her PJ's with right arm and leg flaccidity and left gaze deviation. Patient was brought to CT scan where a left MCA sign was visualized. Due to age and likely a wake up stroke a CT perfusion was obtained showing large area of viable brain. Patient was brought straight to IR. tPA was not given as she was out of window  SUBJECTIVE Multiple family members at bedside. Pt up in hall walking without difficulty.  imodium has improved diarrhea. However, she complains of sudden severe headaches at the left. Patient stated that for the last 3 months she was doing yoga, and aggressive exercises, and about one month ago she started having this left sided headache, but denies any neck pain. This morning she also developed severe left-sided headache after 2-D echo.  OBJECTIVE Most recent Vital Signs: Filed Vitals:   06/01/14 0304 06/01/14 0651 06/01/14 1034 06/01/14 1347  BP: 123/72 119/85 118/80 123/84  Pulse: 85 83 87 83  Temp: 98.3 F (36.8 C) 98.4 F (36.9 C) 98.1 F (36.7 C) 97.9 F (36.6 C)  TempSrc: Oral Oral Oral Oral  Resp: 18 20 20 20   Height:      Weight:      SpO2: 99% 97% 96% 100%   CBG (last 3)   Recent Labs  06/01/14 0650 06/01/14 1157 06/01/14 1634  GLUCAP 110* 104* 98    IV Fluid Intake:      MEDICATIONS  . aspirin  325 mg Oral Daily  . enoxaparin (LOVENOX) injection  40 mg Subcutaneous Q12H  . pantoprazole  40 mg Oral Daily  . potassium chloride  20 mEq Oral BID  . rosuvastatin  5 mg Oral q1800   PRN:  [DISCONTINUED] acetaminophen **OR** acetaminophen, acetaminophen, butalbital-acetaminophen-caffeine, loperamide, ondansetron (ZOFRAN) IV, RESOURCE THICKENUP CLEAR,  senna-docusate  Diet:  DIET DYS 2 nectar thick liquids Activity:  Up as tolerated DVT Prophylaxis:  Lovenox 40 mg sq daily   CLINICALLY SIGNIFICANT STUDIES Basic Metabolic Panel:   Recent Labs Lab 05/30/14 0345 05/31/14 0235 06/01/14 0552  NA 137 139 139  K 3.5 3.4* 3.7  CL 109 111 113*  CO2 22 24 21   GLUCOSE 109* 104* 107*  BUN 6 5* 10  CREATININE 0.67 0.71 0.83  CALCIUM 8.4 8.8 9.5  MG 1.8 2.0  --   PHOS 2.9 2.9  --    Liver Function Tests:   Recent Labs Lab 05/29/14 0900  AST 29  ALT 22  ALKPHOS 73  BILITOT 0.4  PROT 7.2  ALBUMIN 3.5   CBC:   Recent Labs Lab 05/31/14 0235 06/01/14 0552  WBC 15.3* 16.1*  NEUTROABS 11.0* 11.0*  HGB 10.8* 11.4*  HCT 32.6* 34.4*  MCV 89.3 90.3  PLT 395 428*   Coagulation:   Recent Labs Lab 05/29/14 0900  LABPROT 13.5  INR 1.02   Cardiac Enzymes: No results for input(s): CKTOTAL, CKMB, CKMBINDEX, TROPONINI in the last 168 hours. Urinalysis:   Recent Labs Lab 05/29/14 0942  COLORURINE YELLOW  LABSPEC 1.029  PHURINE 5.0  GLUCOSEU NEGATIVE  HGBUR NEGATIVE  BILIRUBINUR NEGATIVE  KETONESUR NEGATIVE  PROTEINUR NEGATIVE  UROBILINOGEN 0.2  NITRITE NEGATIVE  LEUKOCYTESUR NEGATIVE  Lipid Panel    Component Value Date/Time   CHOL 173 05/30/2014 1200   TRIG 225* 05/30/2014 1200   HDL 31* 05/30/2014 1200   CHOLHDL 5.6 05/30/2014 1200   VLDL 45* 05/30/2014 1200   LDLCALC 97 05/30/2014 1200   HgbA1C  Lab Results  Component Value Date   HGBA1C 5.6 05/30/2014    Urine Drug Screen:      Component Value Date/Time   LABOPIA NONE DETECTED 05/29/2014 0942   COCAINSCRNUR NONE DETECTED 05/29/2014 0942   LABBENZ NONE DETECTED 05/29/2014 0942   AMPHETMU NONE DETECTED 05/29/2014 0942   THCU NONE DETECTED 05/29/2014 0942   LABBARB NONE DETECTED 05/29/2014 0942    Alcohol Level:   Recent Labs Lab 05/29/14 0900  ETH <5    Ct Head Wo Contrast 05/30/2014    Subtle CT changes of left MCA territory infarct,  most apparent at the insula, with no associated hemorrhage or mass effect. No new intracranial abnormality.    05/29/2014   Subtle decreased sulcation in the left cerebral hemisphere raising the question for diffuse edema. No evidence for acute intracranial hemorrhage.    05/29/2014   Motion degraded exam showing no definite acute stroke or hemorrhage. Hyperdense LEFT ICA suggesting proximal large vessel occlusion    Ct Cerebral Perfusion W/cm 05/29/2014   There could be a moderate-sized ischemic penumbra in the LEFT hemisphere in this patient with suspected LEFT ICA occlusion.  At the time of dictation, the patient is in interventional radiology in preparation for possible thrombolysis.    Cerebral Angiogram 05/29/2014 LT ICA and LT T occlusion in isolated Lt cerebral hemisphere. S/P complete TIC 3 revascularization using 2 passes with Trevoprovue 4 mm x 30 mm retrieval device and 8 mg of superselective intracranial intraarterial Integrelin.  Dg Chest Port 1 View 05/31/2014 Extubation, with low lung volumes but no acute disease. 05/30/2014   Low lung volumes with mild right basilar atelectasis.     Dg Abd Portable 1v 05/29/2014    1. OG tube tip in the proximal body of the stomach. 2. Early/incipient small bowel ileus.     MRI/MRA of the brain  Patient refused  CTA head and neck 06/01/14 1. Collected evolution of infarcts involving the left insular cortex and segmental areas of the left anterior MCA distribution. This territory is significantly smaller than the area of ischemia identified on the CT perfusion scan. 2. Persistent patency of the left internal carotid artery. 3. Slight irregularity of the distal left ICA. This may represent fibromuscular dysplasia. 4. Evidence luxury perfusion within the infarcted territories of the left frontal lobe.  2D Echocardiogram   - Left ventricle: The cavity size was normal. Wall thickness was normal. Systolic function was normal. The estimated  ejection fraction was in the range of 55% to 60%. Wall motion was normal; there were no regional wall motion abnormalities. Doppler parameters are consistent with abnormal left ventricular relaxation (grade 1 diastolic dysfunction). Impressions: - No cardiac source of emboli was indentified.  EKG  Sinus rhythm. Borderline T wave abnormalities  Physical Exam Blood pressure 123/84, pulse 83, temperature 97.9 F (36.6 C), temperature source Oral, resp. rate 20, height  (1.778 m), weight 346 lb (156.945 kg), SpO2 100 %. General: The patient is alert and cooperative at the time of the examination. The patient is morbidly obese. Respiratory: Lung fields are clear, with the exception of occasional posterior rhonchi Cardiovascular: Regular rate and rhythm, no obvious murmurs or rubs Abdomen: Obese, nontender, positive bowel sounds  Skin: No significant peripheral edema is noted.  Mental status: The patient is alert and cooperative, expressive aphasia, not able naming and repeating, comprehension intact. Cranial nerves: Right nasolabial fold flattening, right facial droop. Tongue in middle. PERRL, Extraocular movements are full. Visual fields are full. Motor: The patient has good strength in all 4 extremities. Sensory examination: Patient indicates that there is good symmetry was soft touch on the face, arms, and legs. Coordination: The cerebellar testing was unremarkable with finger-nose-finger, and toe to finger bilaterally. Gait and station: Normal gait with PT. Reflexes: Deep tendon reflexes are symmetric, but are depressed throughout.  ASSESSMENT Ms. PRESLIE REINSCH is a 39 y.o. female presenting with right hemiparesis, aphasia and left gaze on waking up. The patient did not receive TPA secondary to unknown time of onset. Perfusion scan revealed left brain large penumbra, the patient underwent clot extraction with good success. The patient has regained function of all 4  extremities, following verbal commands.   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  Will consider TEE to look for source of cardiac embolus  Autoimmune workup pending  Will do lower extremity DVT screening and TCD bubble study   Hypercoagulable state workup so far negative, some pending.   On no medications prior to admission. Started on aspirin 325 mg daily.  LDL 89  HgbA1c 5.6, at the goal  Therapy Recommendations - No PT or OT at d/c,  OP ST  Hyperlipidemia  LDL 89, not at goal  Nomads at home  On Crestor 5 mg  Continue discharge  Other risk factor for stroke  Morbid obesity, Body mass index is 49.65 kg/(m^2).   Hx migraine headaches  Family hx stroke (father)  If CTA unrevealing, will schedule TEE to look for source of embolus  OP ST  Ok to work with therapy  F/u Hypercoagulable state 2D echo is pending  Diarrhea   cdiff neg  imodium helps  Symptoms improved   Headaches  CTA head and neck did not show dissection  fiorecet for HA  Leukocytosis  Afebrile  CXR ok 1/17  UA neg 1/15.   No abx  Will check blood cultures  Hospital day # 3   SIGNED [facility] See Amion for Pager information 06/01/2014 11:40 AM   I, the attending vascular neurologist, have personally obtained a history, examined the patient, evaluated laboratory data, individually viewed imaging studies and agree with radiology interpretations. I also obtained additional history from pt's son and sister and mom at bedside. Together with the NP/PA, we formulated the  assessment and plan of care which reflects our mutual decision.  I have made any additions or clarifications directly to the above note and agree with the findings and plan as currently documented.   39 year old female with no significant past medical history presented with right hemiparesis, left gaze, aphasia. Cerebral angiogram showed left proximal and terminal ICA tandem occlusion status post mechanical thrombectomy with TICI3 recannulization. IR imaging suggest left ICA dissection, however repeat CTA head and neck did not show dissection. Etiology for stroke not clear at this moment, we will continue further workup with lower stream the  DVT screening, TCD bubble study, hypercoagulable and autoimmune workup, as well as TEE. Continue aspirin and statin for stroke prevention. Continue speech therapy.  Marvel Plan, MD PhD Stroke Neurology 06/01/2014 5:24 PM      To contact Stroke Continuity provider, please refer to [email or web address]. After hours, contact General Neurology

## 2014-06-01 NOTE — Progress Notes (Signed)
  Echocardiogram 2D Echocardiogram has been performed.  Arvil Chaco 06/01/2014, 10:16 AM

## 2014-06-01 NOTE — Progress Notes (Signed)
Speech Language Pathology Treatment: Dysphagia;Cognitive-Linquistic  Patient Details Name: Rebecca Russo MRN: [id number] DOB: [birth year] Today's Date: 06/01/2014 Time: 3817-7116 SLP Time Calculation (min) (ACUTE ONLY): 21 min  Assessment / Plan / Recommendation Clinical Impression  ST intervention during lunch with friends present. Pt required min-mod cues for emergent awareness of errors in basic comprehension. Perseverative on the word "so" during verbal exchanges and automatic speech (counting) paired with visual/written information. Pt able to respond to responsive naming questions given written choices from field or 3-4. Friend stated pt said "ok" today. Thin trials of water given for possible upgrade with delayed throat clears throughout liquid and solid trials. Min-moderate verbal cues needed for smaller sips and bites. Recommend she continue Dys 2 textures, nectar thick liquids with MBS tomorrow to fully assess swallow function.   HPI HPI: Rebecca Russo is an 39 y.o. female who went to sleep 05/28/14 around 2200 hours which is the last time she was seen normal. The next morning at 0800 hours she was heard falling out of her bed. Her son came into the room and found her on the floor in her PJ's with right arm and leg flaccidity and left gaze deviation. Patient was brought to CT scan where a left MCA sign was visualized. Due to age and likely a wake up stroke a CT perfusion was obtained showing large area of viable brain. Patient was brought straight to IR. Pt was intubated 1/15-1/16.   Pertinent Vitals Pain Assessment: No/denies pain  SLP Plan  Continue with current plan of care;MBS    Recommendations Diet recommendations: Dysphagia 2 (fine chop);Nectar-thick liquid Liquids provided via: Cup;Straw Medication Administration: Whole meds with puree Supervision: Patient able to self feed;Full supervision/cueing for compensatory strategies Compensations: Slow rate;Small sips/bites;Check for  pocketing Postural Changes and/or Swallow Maneuvers: Seated upright 90 degrees              General recommendations: Rehab consult Oral Care Recommendations: Oral care BID Follow up Recommendations: Inpatient Rehab;24 hour supervision/assistance Plan: Continue with current plan of care;MBS    GO     [name] 06/01/2014, 12:45 PM  [name] [name].Ed [employer] [phone]

## 2014-06-01 NOTE — Progress Notes (Signed)
UR complete.  Yuvaan Olander RN, MSN 

## 2014-06-02 ENCOUNTER — Inpatient Hospital Stay (HOSPITAL_COMMUNITY): Payer: Managed Care, Other (non HMO)

## 2014-06-02 DIAGNOSIS — Q211 Atrial septal defect: Secondary | ICD-10-CM

## 2014-06-02 LAB — RHEUMATOID FACTOR: Rhuematoid fact SerPl-aCnc: <10 IU/mL (ref <=14)

## 2014-06-02 LAB — PROTHROMBIN GENE MUTATION

## 2014-06-02 LAB — FACTOR 5 LEIDEN

## 2014-06-02 NOTE — Procedures (Signed)
Objective Swallowing Evaluation: Modified Barium Swallowing Study  Patient Details  Name: Rebecca Russo MRN: [id number] Date of Birth: 09/24/1975  Today's Date: 06/02/2014 Time: SLP Start Time (ACUTE ONLY): 1045-SLP Stop Time (ACUTE ONLY): 1105 SLP Time Calculation (min) (ACUTE ONLY): 20 min  Past Medical History:  Past Medical History  Diagnosis Date  . H/O varicella   . Increased BMI 12/17/08   Past Surgical History:  Past Surgical History  Procedure Laterality Date  . Laparoscopic cholecystectomy  06/20/2012  . Radiology with anesthesia N/A 05/29/2014    Procedure: RADIOLOGY WITH ANESTHESIA;  Surgeon: Oneal Grout, MD;  Location: [facility] OR;  Service: Radiology;  Laterality: N/A;   HPI:  HPI: Rebecca Russo is an 39 y.o. female who went to sleep 05/28/14 around 2200 hours which is the last time she was seen normal. The next morning at 0800 hours she was heard falling out of her bed. Her son came into the room and found her on the floor in her PJ's with right arm and leg flaccidity and left gaze deviation. Patient was brought to CT scan where a left MCA sign was visualized. Due to age and likely a wake up stroke a CT perfusion was obtained showing large area of viable brain. Patient was brought straight to IR. Pt was intubated 1/15-1/16. Pt demonstrating continued infrequent and subtle signs aspiration with thin at bedside, therefore recommend MBS to determine safety for return to thin liquids.  No Data Recorded  Assessment / Plan / Recommendation CHL IP CLINICAL IMPRESSIONS 06/02/2014  Dysphagia Diagnosis WFL  Clinical impression Pt exhibited overall, functional oral and pharyngeal phases of swallow. Adequate motor and sensory tracts without laryngeal penetration or aspiration. She did however, clear throat several times without barium observed in laryngeal vestibule or trachea. Unable to scan to view esophagus due to pt body habitus. Recommend upgrade to regular texture diet and thin  liquids with frequent checking of right buccal cavity due to possible pocketing on right side and decreased sensation (anterior loss of saliva on right without pt sensing), pills with thin liquid and straws allowed. ST will continue to follow for efficiency and safety with diet/liquids.       CHL IP TREATMENT RECOMMENDATION 06/02/2014  Treatment Plan Recommendations Therapy as outlined in treatment plan below     CHL IP DIET RECOMMENDATION 06/02/2014  Diet Recommendations Regular;Thin liquid  Liquid Administration via Cup;Straw  Medication Administration Whole meds with liquid  Compensations Slow rate;Small sips/bites;Check for pocketing  Postural Changes and/or Swallow Maneuvers Seated upright 90 degrees     CHL IP OTHER RECOMMENDATIONS 06/02/2014  Recommended Consults (None)  Oral Care Recommendations Oral care BID  Other Recommendations (None)     CHL IP FOLLOW UP RECOMMENDATIONS 06/02/2014  Follow up Recommendations Inpatient Rehab;24 hour supervision/assistance     CHL IP FREQUENCY AND DURATION 06/02/2014  Speech Therapy Frequency (ACUTE ONLY) min 2x/week  Treatment Duration 2 weeks     Pertinent Vitals/Pain none    SLP Swallow Goals No flowsheet data found.  No flowsheet data found.    CHL IP REASON FOR REFERRAL 06/02/2014  Reason for Referral Objectively evaluate swallowing function     CHL IP ORAL PHASE 06/02/2014  Lips (None)  Tongue (None)  Mucous membranes (None)  Nutritional status (None)  Other (None)  Oxygen therapy (None)  Oral Phase WFL  Oral - Pudding Teaspoon (None)  Oral - Pudding Cup (None)  Oral - Honey Teaspoon (None)  Oral - Honey Cup (None)  Oral - Honey Syringe (None)  Oral - Nectar Teaspoon (None)  Oral - Nectar Cup (None)  Oral - Nectar Straw (None)  Oral - Nectar Syringe (None)  Oral - Ice Chips (None)  Oral - Thin Teaspoon (None)  Oral - Thin Cup (None)  Oral - Thin Straw (None)  Oral - Thin Syringe (None)  Oral - Puree (None)  Oral -  Mechanical Soft (None)  Oral - Regular (None)  Oral - Multi-consistency (None)  Oral - Pill (None)  Oral Phase - Comment (None)      CHL IP PHARYNGEAL PHASE 06/02/2014  Pharyngeal Phase WFL  Pharyngeal - Pudding Teaspoon (None)  Penetration/Aspiration details (pudding teaspoon) (None)  Pharyngeal - Pudding Cup (None)  Penetration/Aspiration details (pudding cup) (None)  Pharyngeal - Honey Teaspoon (None)  Penetration/Aspiration details (honey teaspoon) (None)  Pharyngeal - Honey Cup (None)  Penetration/Aspiration details (honey cup) (None)  Pharyngeal - Honey Syringe (None)  Penetration/Aspiration details (honey syringe) (None)  Pharyngeal - Nectar Teaspoon (None)  Penetration/Aspiration details (nectar teaspoon) (None)  Pharyngeal - Nectar Cup (None)  Penetration/Aspiration details (nectar cup) (None)  Pharyngeal - Nectar Straw (None)  Penetration/Aspiration details (nectar straw) (None)  Pharyngeal - Nectar Syringe (None)  Penetration/Aspiration details (nectar syringe) (None)  Pharyngeal - Ice Chips (None)  Penetration/Aspiration details (ice chips) (None)  Pharyngeal - Thin Teaspoon (None)  Penetration/Aspiration details (thin teaspoon) (None)  Pharyngeal - Thin Cup (None)  Penetration/Aspiration details (thin cup) (None)  Pharyngeal - Thin Straw (None)  Penetration/Aspiration details (thin straw) (None)  Pharyngeal - Thin Syringe (None)  Penetration/Aspiration details (thin syringe') (None)  Pharyngeal - Puree (None)  Penetration/Aspiration details (puree) (None)  Pharyngeal - Mechanical Soft (None)  Penetration/Aspiration details (mechanical soft) (None)  Pharyngeal - Regular (None)  Penetration/Aspiration details (regular) (None)  Pharyngeal - Multi-consistency (None)  Penetration/Aspiration details (multi-consistency) (None)  Pharyngeal - Pill (None)  Penetration/Aspiration details (pill) (None)  Pharyngeal Comment (None)     No flowsheet data found.  No  flowsheet data found.         [name] 06/02/2014, 1:15 PM   [name] [name].Ed [employer] [phone]

## 2014-06-02 NOTE — Progress Notes (Signed)
Stroke Team Progress Note  HISTORY Aeriana L Metzer is an 39 y.o. female who went to sleep last night 05/28/2014 at around 2200 hours which is the last time she seen normal. This AM at 0800 hours she was heard falling out of her bed. Her son came into the room and found her on the floor in her PJ's with right arm and leg flaccidity and left gaze deviation. Patient was brought to CT scan where a left MCA sign was visualized. Due to age and likely a wake up stroke a CT perfusion was obtained showing large area of viable brain. Patient was brought straight to IR. tPA was not given as she was out of window  SUBJECTIVE Her brother is at the bedside. She was on OCPs prior to admission, but denies smoking. She can't remember what kind they were. She works for [employer] and she got pills from them.   OBJECTIVE Most recent Vital Signs: Filed Vitals:   06/01/14 2221 06/02/14 0119 06/02/14 0515 06/02/14 0953  BP: 130/85 118/73 121/75 121/85  Pulse: 84 92 83 77  Temp: 97.9 F (36.6 C) 98 F (36.7 C) 97.8 F (36.6 C) 98.5 F (36.9 C)  TempSrc: Oral Oral Oral Oral  Resp: Height:      Weight:      SpO2: 97% 97% 98% 99%   CBG (last 3)   Recent Labs  06/01/14 0650 06/01/14 1157 06/01/14 1634  GLUCAP 110* 104* 98    IV Fluid Intake:      MEDICATIONS  . aspirin  325 mg Oral Daily  . enoxaparin (LOVENOX) injection  40 mg Subcutaneous Q12H  . pantoprazole  40 mg Oral Daily  . potassium chloride  20 mEq Oral BID  . rosuvastatin  5 mg Oral q1800   PRN:  [DISCONTINUED] acetaminophen **OR** acetaminophen, acetaminophen, butalbital-acetaminophen-caffeine, loperamide, ondansetron (ZOFRAN) IV, RESOURCE THICKENUP CLEAR, senna-docusate  Diet:  DIET DYS 2 nectar thick liquids Activity:  Up as tolerated DVT Prophylaxis:  Lovenox 40 mg sq daily   CLINICALLY SIGNIFICANT STUDIES Basic Metabolic Panel:   Recent Labs Lab 05/30/14 0345 05/31/14 0235 06/01/14 0552  NA 137 139 139  K 3.5  3.4* 3.7  CL 109 111 113*  CO2 GLUCOSE 109* 104* 107*  BUN 6 5* 10  CREATININE 0.67 0.71 0.83  CALCIUM 8.4 8.8 9.5  MG 1.8 2.0  --   PHOS 2.9 2.9  --    Liver Function Tests:   Recent Labs Lab 05/29/14 0900  AST 29  ALT 22  ALKPHOS 73  BILITOT 0.4  PROT 7.2  ALBUMIN 3.5   CBC:   Recent Labs Lab 05/31/14 0235 06/01/14 0552  WBC 15.3* 16.1*  NEUTROABS 11.0* 11.0*  HGB 10.8* 11.4*  HCT 32.6* 34.4*  MCV 89.3 90.3  PLT 395 428*   Coagulation:   Recent Labs Lab 05/29/14 0900  LABPROT 13.5  INR 1.02   Cardiac Enzymes: No results for input(s): CKTOTAL, CKMB, CKMBINDEX, TROPONINI in the last 168 hours. Urinalysis:   Recent Labs Lab 05/29/14 0942  COLORURINE YELLOW  LABSPEC 1.029  PHURINE 5.0  GLUCOSEU NEGATIVE  HGBUR NEGATIVE  BILIRUBINUR NEGATIVE  KETONESUR NEGATIVE  PROTEINUR NEGATIVE  UROBILINOGEN 0.2  NITRITE NEGATIVE  LEUKOCYTESUR NEGATIVE   Lipid Panel    Component Value Date/Time   CHOL 173 05/30/2014 1200   TRIG 225* 05/30/2014 1200   HDL 31* 05/30/2014 1200   CHOLHDL 5.6 05/30/2014 1200  VLDL 45* 05/30/2014 1200   LDLCALC 97 05/30/2014 1200   HgbA1C  Lab Results  Component Value Date   HGBA1C 5.6 05/30/2014    Urine Drug Screen:      Component Value Date/Time   LABOPIA NONE DETECTED 05/29/2014 0942   COCAINSCRNUR NONE DETECTED 05/29/2014 0942   LABBENZ NONE DETECTED 05/29/2014 0942   AMPHETMU NONE DETECTED 05/29/2014 0942   THCU NONE DETECTED 05/29/2014 0942   LABBARB NONE DETECTED 05/29/2014 0942    Alcohol Level:   Recent Labs Lab 05/29/14 0900  ETH <5    Ct Head Wo Contrast 05/30/2014    Subtle CT changes of left MCA territory infarct, most apparent at the insula, with no associated hemorrhage or mass effect. No new intracranial abnormality.    05/29/2014   Subtle decreased sulcation in the left cerebral hemisphere raising the question for diffuse edema. No evidence for acute intracranial hemorrhage.     05/29/2014   Motion degraded exam showing no definite acute stroke or hemorrhage. Hyperdense LEFT ICA suggesting proximal large vessel occlusion    Ct Cerebral Perfusion W/cm 05/29/2014   There could be a moderate-sized ischemic penumbra in the LEFT hemisphere in this patient with suspected LEFT ICA occlusion.  At the time of dictation, the patient is in interventional radiology in preparation for possible thrombolysis.    Cerebral Angiogram 05/29/2014 LT ICA and LT T occlusion in isolated Lt cerebral hemisphere. S/P complete TIC 3 revascularization using 2 passes with Trevoprovue 4 mm x 30 mm retrieval device and 8 mg of superselective intracranial intraarterial Integrelin.  Dg Chest Port 1 View 05/31/2014 Extubation, with low lung volumes but no acute disease. 05/30/2014   Low lung volumes with mild right basilar atelectasis.     Dg Abd Portable 1v 05/29/2014    1. OG tube tip in the proximal body of the stomach. 2. Early/incipient small bowel ileus.     MRI/MRA of the brain  Patient refused  CTA head and neck 06/01/14 1. Collected evolution of infarcts involving the left insular cortex and segmental areas of the left anterior MCA distribution. This territory is significantly smaller than the area of ischemia identified on the CT perfusion scan. 2. Persistent patency of the left internal carotid artery. 3. Slight irregularity of the distal left ICA. This may represent fibromuscular dysplasia. 4. Evidence luxury perfusion within the infarcted territories of the left frontal lobe.  2D Echocardiogram   - Left ventricle: The cavity size was normal. Wall thickness was normal. Systolic function was normal. The estimated ejection fraction was in the range of 55% to 60%. Wall motion was normal; there were no regional wall motion abnormalities. Dopplerparameters are consistent with abnormal left ventricularrelaxation (grade 1 diastolic dysfunction).  Impressions: No cardiac source of emboli was  indentified.  LE venous doppler - negative for DVT  EKG  Sinus rhythm. Borderline T wave abnormalities   Physical Exam Blood pressure 121/85, pulse 77, temperature 98.5 F (36.9 C), temperature source Oral, resp. rate 20, height 5\' 10"  (1.778 m), weight 156.945 kg (346 lb), SpO2 99 %. General: The patient is alert and cooperative at the time of the examination. The patient is morbidly obese. Respiratory: Lung fields are clear, with the exception of occasional posterior rhonchi Cardiovascular: Regular rate and rhythm, no obvious murmurs or rubs Abdomen: Obese, nontender, positive bowel sounds Skin: No significant peripheral edema is noted.  Mental status: The patient is alert and cooperative, expressive aphasia, not able naming and repeating, comprehension intact. Cranial nerves:  Right nasolabial fold flattening, right facial droop. Tongue in middle. PERRL, Extraocular movements are full. Visual fields are full. Motor: The patient has good strength in all 4 extremities. Sensory examination: Patient indicates that there is good symmetry was soft touch on the face, arms, and legs. Coordination: The cerebellar testing was unremarkable with finger-nose-finger, and toe to finger bilaterally. Gait and station: Normal gait with PT. Reflexes: Deep tendon reflexes are symmetric, but are depressed throughout.  ASSESSMENT Ms. HELEN WINTERHALTER is a 39 y.o. female presenting with right hemiparesis, aphasia and left gaze on waking up. The patient did not receive TPA secondary to unknown time of onset. Perfusion scan revealed left brain large penumbra, the patient underwent clot extraction with TICI3 recannulization. The patient has regained function of all 4 extremities, following verbal commands.   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 TEE to look for embolic source. Arranged with [facility] for tomorrow. (I have made patient NPO after midnight tonight).    Lower extremity doppler - negative for DVT.   TCD bubble study pending    Autoimmune and Hypercoagulable state workup so far negative, some pending.   On no antithrombotics prior to admission. Started on aspirin 325 mg daily.  LDL 89  HgbA1c 5.6, at the goal  Was taking oral contraceptives prior to admission but denies smoking. She has been advised not to take at discharge and find another, non-hormonal method DIET DYS 2 nectar thick liquids  Therapy Recommendations - No PT or OT at d/c,  OP ST  Hyperlipidemia  LDL 89, not at goal  Nomads at home  On Crestor 5 mg  Continue discharge  Other risk factor for stroke  Morbid obesity, Body mass index is 49.65 kg/(m^2).   Hx migraine headaches  Family hx stroke (father)  Taking OCP prior to admission  Diarrhea   cdiff neg  imodium helps  Symptoms improved   Headaches  CTA head and neck did not show dissection  fiorecet for HA  Leukocytosis  Afebrile  CXR ok 1/17  UA neg 1/15.   No abx  Will check blood cultures  Hospital day # 4   SIGNED [facility] See Amion for Pager information 06/02/2014 9:56 AM   I, the attending vascular neurologist, have personally obtained a history, examined the patient, evaluated laboratory data, individually viewed imaging studies and agree with radiology interpretations. Together with the NP/PA, we formulated the assessment and plan of care which reflects our mutual decision.  I have made any additions or clarifications directly to the above note and agree with the findings and plan as currently documented.   39 year old female with  no significant past medical history presented with right hemiparesis, left gaze, aphasia. Cerebral angiogram showed left terminal ICA occlusion status post mechanical thrombectomy with TICI3 recannulization. Repeat CTA head and neck showed patent vessels. Etiology for stroke not clear at this moment, stroke work up so far negative. Will do TEE in am and TCD bubble study today. She was taking OCP daily prior to admission but denies smoking. Continue aspirin and statin for stroke prevention. Continue speech therapy.  Marvel Plan, MD PhD Stroke Neurology 06/02/2014  9:59 PM        To contact Stroke Continuity provider, please refer to [email or web address]. After hours, contact General Neurology

## 2014-06-02 NOTE — Procedures (Signed)
[facility]  [address]  [place]. [place] [address].  [phone]   TRANSCRANIAL DOPPLER BUBBLE STUDY  Rebecca Russo  Date of Birth: January 06, 1976 Medical Record Number: [id number] Indications: stroke Date of Procedure: @T @ Clinical History: embolic stroke in young pt Technical Description: Transcranial Doppler Bubble Study was performed at the bedside after taking written informed consent from the patient and explaining risk/benefits. The left middle cerebral artery was insonated using a hand held probe. And IV line had been previously inserted in the right forearm by the RN using aseptic precautions. Agitated saline injection at rest and after valsalva maneuver did result in shower with high intensity transient signals (HITS).  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  Results were explained to the patient. Questions were answered. I had a long discussion with the patient with regards to the role of patent foramen ovale and risk of stroke. It is unclear at the present time whether PFO closure leads to better secondary stroke prevention or not. There are ongoing clinical trials which are trying to address this issue. I would recommend antiplatelet therapy for now, but will check TEE tomorrow.

## 2014-06-02 NOTE — Progress Notes (Signed)
*  Preliminary Results* Bilateral lower extremity venous duplex completed. Bilateral lower extremities are negative for deep vein thrombosis. There is no evidence of Baker's cyst bilaterally.   TCD with Bubbles- pending  06/02/2014 9:43 AM  Gertie Fey, RVT, RDCS, RDMS

## 2014-06-02 NOTE — Progress Notes (Signed)
    [facility] has been requested to perform a transesophageal echocardiogram on Ms. Kranich for evaluation of cardioembolic source of CVA.  After careful review of history and examination, the risks and benefits of transesophageal echocardiogram have been explained including risks of esophageal damage, perforation (1:10,000 risk), bleeding, pharyngeal hematoma as well as other potential complications associated with conscious sedation including aspiration, arrhythmia, respiratory failure and death. Alternatives to treatment were discussed, questions were answered. Patient is willing to proceed.   Nicolasa Ducking, NP 06/02/2014 3:09 PM

## 2014-06-03 ENCOUNTER — Ambulatory Visit (HOSPITAL_COMMUNITY)
Admission: RE | Admit: 2014-06-03 | Payer: Managed Care, Other (non HMO) | Source: Ambulatory Visit | Admitting: Cardiovascular Disease

## 2014-06-03 ENCOUNTER — Encounter (HOSPITAL_COMMUNITY): Payer: Self-pay

## 2014-06-03 ENCOUNTER — Encounter (HOSPITAL_COMMUNITY)
Admission: EM | Disposition: A | Discharge status: Home Health | Payer: Self-pay | Source: Home / Self Care | Attending: Neurology

## 2014-06-03 DIAGNOSIS — I639 Cerebral infarction, unspecified: Secondary | ICD-10-CM

## 2014-06-03 HISTORY — PX: TEE WITHOUT CARDIOVERSION: SHX5443

## 2014-06-03 LAB — BASIC METABOLIC PANEL
Anion gap: 12 (ref 5–15)
BUN: 13 mg/dL (ref 6–23)
CHLORIDE: 110 meq/L (ref 96–112)
CO2: 18 mmol/L — ABNORMAL LOW (ref 19–32)
CREATININE: 0.79 mg/dL (ref 0.50–1.10)
Calcium: 9.2 mg/dL (ref 8.4–10.5)
GFR calc Af Amer: >90 mL/min (ref >90)
GFR calc non Af Amer: >90 mL/min (ref >90)
GLUCOSE: 96 mg/dL (ref 70–99)
Potassium: 3.8 mmol/L (ref 3.5–5.1)
Sodium: 140 mmol/L (ref 135–145)

## 2014-06-03 LAB — CBC
HEMATOCRIT: 33.3 % — AB (ref 36.0–46.0)
Hemoglobin: 11.5 g/dL — ABNORMAL LOW (ref 12.0–15.0)
MCH: 30.6 pg (ref 26.0–34.0)
MCHC: 34.5 g/dL (ref 30.0–36.0)
MCV: 88.6 fL (ref 78.0–100.0)
PLATELETS: 431 10*3/uL — AB (ref 150–400)
RBC: 3.76 MIL/uL — ABNORMAL LOW (ref 3.87–5.11)
RDW: 13.9 % (ref 11.5–15.5)
WBC: 9 10*3/uL (ref 4.0–10.5)

## 2014-06-03 LAB — C4 COMPLEMENT: COMPLEMENT C4, BODY FLUID: 38 mg/dL (ref 10–40)

## 2014-06-03 LAB — C3 COMPLEMENT: C3 Complement: 210 mg/dL — ABNORMAL HIGH (ref 90–180)

## 2014-06-03 LAB — ANA: Anti Nuclear Antibody(ANA): NEGATIVE

## 2014-06-03 LAB — ANCA SCREEN W REFLEX TITER
Atypical p-ANCA Screen: NEGATIVE
c-ANCA Screen: NEGATIVE
p-ANCA Screen: NEGATIVE

## 2014-06-03 LAB — SJOGRENS SYNDROME-A EXTRACTABLE NUCLEAR ANTIBODY: SSA (RO) (ENA) ANTIBODY, IGG: NEGATIVE

## 2014-06-03 LAB — SJOGRENS SYNDROME-B EXTRACTABLE NUCLEAR ANTIBODY: SSB (La) (ENA) Antibody, IgG: <1

## 2014-06-03 LAB — ANTI-DNA ANTIBODY, DOUBLE-STRANDED: ds DNA Ab: 1 IU/mL

## 2014-06-03 SURGERY — ECHOCARDIOGRAM, TRANSESOPHAGEAL
Anesthesia: Moderate Sedation

## 2014-06-03 MED ORDER — ASPIRIN 325 MG PO TABS: 325.0000 mg | ORAL_TABLET | Freq: Every day | ORAL | Status: DC

## 2014-06-03 MED ORDER — FENTANYL CITRATE 0.05 MG/ML IJ SOLN
INTRAMUSCULAR | Status: AC
  Filled 2014-06-03: qty 2

## 2014-06-03 MED ORDER — FENTANYL CITRATE 0.05 MG/ML IJ SOLN
INTRAMUSCULAR | Status: DC | PRN
  Administered 2014-06-03: 25 ug via INTRAVENOUS
  Administered 2014-06-03: 50 ug via INTRAVENOUS

## 2014-06-03 MED ORDER — MIDAZOLAM HCL 10 MG/2ML IJ SOLN
INTRAMUSCULAR | Status: DC | PRN
Start: 2014-06-03 — End: 2014-06-03
  Administered 2014-06-03: 1 mg via INTRAVENOUS
  Administered 2014-06-03 (×2): 2 mg via INTRAVENOUS

## 2014-06-03 MED ORDER — ROSUVASTATIN CALCIUM 5 MG PO TABS: 5.0000 mg | ORAL_TABLET | Freq: Every day | ORAL | Status: DC

## 2014-06-03 MED ORDER — BUTAMBEN-TETRACAINE-BENZOCAINE 2-2-14 % EX AERO
INHALATION_SPRAY | CUTANEOUS | Status: DC | PRN
  Administered 2014-06-03: 2 via TOPICAL

## 2014-06-03 MED ORDER — SODIUM CHLORIDE 0.9 % IV SOLN: INTRAVENOUS | Status: DC

## 2014-06-03 MED ORDER — DIPHENHYDRAMINE HCL 50 MG/ML IJ SOLN
INTRAMUSCULAR | Status: AC
  Filled 2014-06-03: qty 1

## 2014-06-03 MED ORDER — DIPHENHYDRAMINE HCL 50 MG/ML IJ SOLN
INTRAMUSCULAR | Status: DC | PRN
  Administered 2014-06-03 (×2): 25 mg via INTRAVENOUS

## 2014-06-03 MED ORDER — MIDAZOLAM HCL 5 MG/ML IJ SOLN
INTRAMUSCULAR | Status: AC
Start: 2014-06-03 — End: 2014-06-03
  Filled 2014-06-03: qty 2

## 2014-06-03 NOTE — H&P (View-Only) (Signed)
Stroke Team Progress Note  HISTORY Rebecca Russo is an 39 y.o. female who went to sleep last night 05/28/2014 at around 2200 hours which is the last time she seen normal. This AM at 0800 hours she was heard falling out of her bed. Her son came into the room and found her on the floor in her PJ's with right arm and leg flaccidity and left gaze deviation. Patient was brought to CT scan where a left MCA sign was visualized. Due to age and likely a wake up stroke a CT perfusion was obtained showing large area of viable brain. Patient was brought straight to IR. tPA was not given as she was out of window  SUBJECTIVE No family at bedside. Pt is doing well with no complains. Still expressive aphasia. For TEE today at 3p. Looking forward to discharge later today.   OBJECTIVE Most recent Vital Signs: Filed Vitals:   06/02/14 1752 06/02/14 2112 06/03/14 0205 06/03/14 0547  BP: 119/73 120/75 118/78 125/70  Pulse: 90 85 81 87  Temp: 98.3 F (36.8 C) 98.4 F (36.9 C) 98.7 F (37.1 C) 98.5 F (36.9 C)  TempSrc: Oral Oral Oral Oral  Resp: 20 18 18 20  Height:      Weight:      SpO2: 100% 100% 99% 97%   CBG (last 3)   Recent Labs  06/01/14 0650 06/01/14 1157 06/01/14 1634  GLUCAP 110* 104* 98    IV Fluid Intake:      MEDICATIONS  . aspirin  325 mg Oral Daily  . enoxaparin (LOVENOX) injection  40 mg Subcutaneous Q12H  . pantoprazole  40 mg Oral Daily  . potassium chloride  20 mEq Oral BID  . rosuvastatin  5 mg Oral q1800   PRN:  acetaminophen, butalbital-acetaminophen-caffeine, loperamide, RESOURCE THICKENUP CLEAR, senna-docusate  Diet:  Diet NPO time specified  Activity:  Up as tolerated DVT Prophylaxis:  Lovenox 40 mg sq daily   CLINICALLY SIGNIFICANT STUDIES Basic Metabolic Panel:   Recent Labs Lab 05/30/14 0345 05/31/14 0235 06/01/14 0552 06/03/14 0626  NA 137 139 139 140  K 3.5 3.4* 3.7 3.8  CL 109 111 113* 110  CO2 22 24 21 18*  GLUCOSE 109* 104* 107* 96  BUN 6  5* 10 13  CREATININE 0.67 0.71 0.83 0.79  CALCIUM 8.4 8.8 9.5 9.2  MG 1.8 2.0  --   --   PHOS 2.9 2.9  --   --    Liver Function Tests:   Recent Labs Lab 05/29/14 0900  AST 29  ALT 22  ALKPHOS 73  BILITOT 0.4  PROT 7.2  ALBUMIN 3.5   CBC:   Recent Labs Lab 05/31/14 0235 06/01/14 0552 06/03/14 0626  WBC 15.3* 16.1* 9.0  NEUTROABS 11.0* 11.0*  --   HGB 10.8* 11.4* 11.5*  HCT 32.6* 34.4* 33.3*  MCV 89.3 90.3 88.6  PLT 395 428* 431*   Coagulation:   Recent Labs Lab 05/29/14 0900  LABPROT 13.5  INR 1.02   Cardiac Enzymes: No results for input(s): CKTOTAL, CKMB, CKMBINDEX, TROPONINI in the last 168 hours. Urinalysis:   Recent Labs Lab 05/29/14 0942  COLORURINE YELLOW  LABSPEC 1.029  PHURINE 5.0  GLUCOSEU NEGATIVE  HGBUR NEGATIVE  BILIRUBINUR NEGATIVE  KETONESUR NEGATIVE  PROTEINUR NEGATIVE  UROBILINOGEN 0.2  NITRITE NEGATIVE  LEUKOCYTESUR NEGATIVE   Lipid Panel    Component Value Date/Time   CHOL 173 05/30/2014 1200   TRIG 225* 05/30/2014 1200   HDL 31*   05/30/2014 1200   CHOLHDL 5.6 05/30/2014 1200   VLDL 45* 05/30/2014 1200   LDLCALC 97 05/30/2014 1200   HgbA1C  Lab Results  Component Value Date   HGBA1C 5.6 05/30/2014   Urine Drug Screen:      Component Value Date/Time   LABOPIA NONE DETECTED 05/29/2014 0942   COCAINSCRNUR NONE DETECTED 05/29/2014 0942   LABBENZ NONE DETECTED 05/29/2014 0942   AMPHETMU NONE DETECTED 05/29/2014 0942   THCU NONE DETECTED 05/29/2014 0942   LABBARB NONE DETECTED 05/29/2014 0942    Alcohol Level:   Recent Labs Lab 05/29/14 0900  ETH <5    Ct Head Wo Contrast 05/30/2014    Subtle CT changes of left MCA territory infarct, most apparent at the insula, with no associated hemorrhage or mass effect. No new intracranial abnormality.    05/29/2014   Subtle decreased sulcation in the left cerebral hemisphere raising the question for diffuse edema. No evidence for acute intracranial hemorrhage.    05/29/2014    Motion degraded exam showing no definite acute stroke or hemorrhage. Hyperdense LEFT ICA suggesting proximal large vessel occlusion    Ct Cerebral Perfusion W/cm 05/29/2014   There could be a moderate-sized ischemic penumbra in the LEFT hemisphere in this patient with suspected LEFT ICA occlusion.  At the time of dictation, the patient is in interventional radiology in preparation for possible thrombolysis.    Cerebral Angiogram 05/29/2014 LT ICA and LT T occlusion in isolated Lt cerebral hemisphere. S/P complete TIC 3 revascularization using 2 passes with Trevoprovue 4 mm x 30 mm retrieval device and 8 mg of superselective intracranial intraarterial Integrelin.  Dg Chest Port 1 View 05/31/2014 Extubation, with low lung volumes but no acute disease. 05/30/2014   Low lung volumes with mild right basilar atelectasis.     Dg Abd Portable 1v 05/29/2014    1. OG tube tip in the proximal body of the stomach. 2. Early/incipient small bowel ileus.     MRI/MRA of the brain  Patient refused  CTA head and neck 06/01/14 1. Collected evolution of infarcts involving the left insular cortex and segmental areas of the left anterior MCA distribution. This territory is significantly smaller than the area of ischemia identified on the CT perfusion scan. 2. Persistent patency of the left internal carotid artery. 3. Slight irregularity of the distal left ICA. This may represent fibromuscular dysplasia. 4. Evidence luxury perfusion within the infarcted territories of the left frontal lobe.  2D Echocardiogram   - Left ventricle: The cavity size was normal. Wall thickness was normal. Systolic function was normal. The estimated ejection fraction was in the range of 55% to 60%. Wall motion was normal; there were no regional wall motion abnormalities. Dopplerparameters are consistent with abnormal left ventricularrelaxation (grade 1 diastolic dysfunction).  Impressions: No cardiac source of emboli was indentified.  LE  venous doppler - negative for DVT  EKG  Sinus rhythm. Borderline T wave abnormalities  TCD Bubble positive for large PFO  TEE  pending    PHYSICAL EXAM General: The patient is alert and cooperative at the time of the examination. The patient is morbidly obese. Respiratory: Lung fields are clear, with the exception of occasional posterior rhonchi Cardiovascular: Regular rate and rhythm, no obvious murmurs or rubs Abdomen: Obese, nontender, positive bowel sounds Skin: No significant peripheral edema is noted.  Mental status: The patient is alert and cooperative, expressive aphasia, not able naming and repeating, comprehension intact. Cranial nerves: Right nasolabial fold flattening, right facial droop. Tongue   in middle. PERRL, Extraocular movements are full. Visual fields are full. Motor: The patient has good strength in all 4 extremities. Sensory examination: Patient indicates that there is good symmetry was soft touch on the face, arms, and legs. Coordination: The cerebellar testing was unremarkable with finger-nose-finger, and toe to finger bilaterally. Gait and station: Normal gait with PT. Reflexes: Deep tendon reflexes are symmetric, but are depressed throughout.   ASSESSMENT [name] is a 38 y.o. female presenting with right hemiparesis, aphasia and left gaze on waking up. The patient did not receive TPA secondary to unknown time of onset. Perfusion scan revealed left brain large penumbra, the patient underwent clot extraction with TICI3 recannulization. The patient has regained function of all 4 extremities, following verbal commands.   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  TEE to look for embolic source. Arranged with [facility] for today at 3p.   No loop given young age and low yield   Lower extremity doppler - negative for DVT.   TCD bubble study positive for PFO   Autoimmune and Hypercoagulable state workup so far negative, some pending. Sickle cell screening pending.  On no antithrombotics prior to admission. Started on aspirin 325 mg daily.  HgbA1c 5.6, at the goal  Was taking oral contraceptives prior to admission but denies smoking. She has been advised not to take at discharge and find another, non-hormonal method  Therapy Recommendations - No PT or OT at d/c,  OP ST for swallow and language (ordered)  Anticipate discharge today following TEE  Hyperlipidemia  LDL 89, not at goal  [place] at home  On Crestor 5 mg  Continue discharge  Other risk factor for stroke  Morbid obesity, Body mass index is 49.65 kg/(m^2). weight loss advised  Hx migraine headaches  Family hx stroke (father)  Taking OCP prior to admission (see above)  Diarrhea   cdiff neg  imodium helps  Symptoms resolved  Headaches  CTA head and neck did not show dissection  fiorecet for HA  improved  Leukocytosis - resolved  Afebrile  CXR ok 1/17  UA neg 1/15.   No abx  WBC repeat today 9.0  blood cultures 06/02/2014 no growth thus far  Hospital day # 5   SIGNED BIBY,SHARON  [facility] See Amion for Pager information 06/03/2014 9:30 AM   I, the attending vascular neurologist, have personally obtained a history, examined the patient, evaluated laboratory data, individually viewed imaging studies and agree with radiology interpretations. Together with the NP/PA, we formulated the assessment and plan of care which reflects our mutual decision.  I have made any additions or clarifications directly to the above note and agree with the findings and plan as currently  documented.   38-year-old female with no significant past medical history presented with right hemiparesis, left gaze, aphasia. Cerebral angiogram showed left terminal ICA occlusion status post mechanical thrombectomy with TICI3 recannulization. Repeat CTA head and neck showed patent vessels. Etiology for stroke not clear at this moment, stroke work up so far negative. Risk factor including OCP, obesity, large PFO.  Waiting for TEE today. Continue aspirin and statin for stroke prevention. Continue speech   therapy. D/c after TEE. Follow up in clinic to consider possible PFO closure.  Gracia Saggese, MD PhD Stroke Neurology 06/03/2014 12:34 PM       To contact Stroke Continuity provider, please refer to [email or web address]. After hours, contact General Neurology  

## 2014-06-03 NOTE — Progress Notes (Signed)
Patient and family stated they were not comfortable taking patient home. MD paged, MD is okay with patient staying another night to watch. Family notified.

## 2014-06-03 NOTE — Interval H&P Note (Signed)
History and Physical Interval Note:  06/03/2014 2:20 PM  Rebecca Russo  has presented today for surgery, with the diagnosis of STROKE  The various methods of treatment have been discussed with the patient and family. After consideration of risks, benefits and other options for treatment, the patient has consented to  Procedure(s): TRANSESOPHAGEAL ECHOCARDIOGRAM (TEE) (N/A) as a surgical intervention .  The patient's history has been reviewed, patient examined, no change in status, stable for surgery.  I have reviewed the patient's chart and labs.  Questions were answered to the patient's satisfaction.     Charlton Haws

## 2014-06-03 NOTE — Discharge Instructions (Signed)
You were admitted for acute stroke with difficulty speech and left sided weakness. You underwent procedure to extract clot and your symptoms improved. You still have speech difficulty and you need out patient speech therapy, we have ordered for you. You will be contacted. We also did stroke work up found that you have a hole in your heart which may need to be closed. I will arrange a cardiology consult for you. You also has risk factor for stroke including obesity, oral contraceptive pills, please stop taking the contraceptive pills and use other non-hormonal means. You also need to lose weight and healthy diet. Please continue aspirin and crestor for stroke prevention. I will see you in clinic in about 1-2 months. You are discharged in good condition.  STROKE/TIA DISCHARGE INSTRUCTIONS SMOKING Cigarette smoking nearly doubles your risk of having a stroke & is the single most alterable risk factor  If you smoke or have smoked in the last 12 months, you are advised to quit smoking for your health.  Most of the excess cardiovascular risk related to smoking disappears within a year of stopping.  Ask you doctor about anti-smoking medications  [place] Quit Line: 1-800-QUIT NOW  Free Smoking Cessation Classes [phone]  CHOLESTEROL Know your levels; limit fat & cholesterol in your diet  Lipid Panel     Component Value Date/Time   CHOL 173 05/30/2014 1200   TRIG 225* 05/30/2014 1200   HDL 31* 05/30/2014 1200   CHOLHDL 5.6 05/30/2014 1200   VLDL 45* 05/30/2014 1200   LDLCALC 97 05/30/2014 1200      Many patients benefit from treatment even if their cholesterol is at goal.  Goal: Total Cholesterol (CHOL) less than 160  Goal:  Triglycerides (TRIG) less than 150  Goal:  HDL greater than 40  Goal:  LDL (LDLCALC) less than 70   BLOOD PRESSURE American Stroke Association blood pressure target is less that 120/80 mm/Hg  Your discharge blood pressure is:  BP: 122/80 mmHg  Monitor your blood  pressure  Limit your salt and alcohol intake  Many individuals will require more than one medication for high blood pressure  DIABETES (A1c is a blood sugar average for last 3 months) Goal HGBA1c is under 7% (HBGA1c is blood sugar average for last 3 months)  Diabetes: No known diagnosis of diabetes    Lab Results  Component Value Date   HGBA1C 5.6 05/30/2014     Your HGBA1c can be lowered with medications, healthy diet, and exercise.  Check your blood sugar as directed by your physician  Call your physician if you experience unexplained or low blood sugars.  PHYSICAL ACTIVITY/REHABILITATION Goal is 30 minutes at least 4 days per week  Activity: No restrictions. Therapies: Speech Therapy: Outpatient Return to work: not at this time. Reevaluate in office   Activity decreases your risk of heart attack and stroke and makes your heart stronger.  It helps control your weight and blood pressure; helps you relax and can improve your mood.  Participate in a regular exercise program.  Talk with your doctor about the best form of exercise for you (dancing, walking, swimming, cycling).  DIET/WEIGHT Goal is to maintain a healthy weight  Your discharge diet is: Diet honey thick liquids Your height is:  Height:  (177.8 cm) Your current weight is: Weight: (!) 346 lb (156.945 kg) Your Body Mass Index (BMI) is:  BMI (Calculated): 49.7  Following the type of diet specifically designed for you will help prevent another stroke.  Your goal Body Mass Index (BMI) is 19-24.  Healthy food habits can help reduce 3 risk factors for stroke:  High cholesterol, hypertension, and excess weight.  RESOURCES Stroke/Support Group:  Call [phone]   STROKE EDUCATION PROVIDED/REVIEWED AND GIVEN TO PATIENT Stroke warning signs and symptoms How to activate emergency medical system (call 911). Medications prescribed at discharge. Need for follow-up after discharge. Personal risk factors for  stroke. Pneumonia vaccine given: No Flu vaccine given: No My questions have been answered, the writing is legible, and I understand these instructions.  I will adhere to these goals & educational materials that have been provided to me after my discharge from the hospital.

## 2014-06-03 NOTE — Progress Notes (Signed)
Speech Language Pathology Treatment: Cognitive-Linquistic  Patient Details Name: Rebecca Russo MRN: [id number] DOB: 27-Jan-1976 Today's Date: 06/03/2014 Time: 7371-0626 SLP Time Calculation (min) (ACUTE ONLY): 25 min  Assessment / Plan / Recommendation Clinical Impression  Pt making excellent progress towards goals today. She followed 2 step directions with 80% accuracy with intermittne repetition and visual cue needed. Approximated tune and majority of words during singing familiar song. Occasional visual or auditory cue to initiate additional automatic speech tasks (counting, days of wee, months). She wrote her first name needing visual cues to copy (except for first letter). Less perseveration noted today. Identified common objects from field of 10-20 from communication board and named pictured objects needing either carrier phrase and/or phonemic cue. Pt surprised and happy with her improvements/increased verbalizations and became emotional. Pt will need continued speech therapy and an outpatient (if outpatient not an option home health).   HPI HPI: Rebecca Russo is an 39 y.o. female who went to sleep 05/28/14 around 2200 hours which is the last time she was seen normal. The next morning at 0800 hours she was heard falling out of her bed. Her son came into the room and found her on the floor in her PJ's with right arm and leg flaccidity and left gaze deviation. Patient was brought to CT scan where a left MCA sign was visualized. Due to age and likely a wake up stroke a CT perfusion was obtained showing large area of viable brain. Patient was brought straight to IR. Pt was intubated 1/15-1/16. Pt demonstrating continued infrequent and subtle signs aspiration with thin at bedside, therefore recommend MBS to determine safety for return to thin liquids.   Pertinent Vitals Pain Assessment: No/denies pain  SLP Plan  Continue with current plan of care    Recommendations                Oral Care  Recommendations: Oral care BID Follow up Recommendations: Outpatient SLP Plan: Continue with current plan of care    GO     Rebecca Russo 06/03/2014, 3:40 PM   Rebecca Russo.Ed [employer] [phone]

## 2014-06-03 NOTE — Progress Notes (Signed)
Patient returned to 4N. VSS, see vitals. Assessment stable. Patient is now resting in bed. Call bell within patient's reach.

## 2014-06-03 NOTE — CV Procedure (Signed)
   Transesophageal Echocardiogram: Indication:  CVA Sedation: Versed: 5, Fentanyl: 50, Other: Benedryl 50 ASA: 2, Airway: 3  Procedure:  The patient was moderately sedated with the above doses of versed and fentanyl.  Using digital technique an omniplane probe was advanced into the distal esophagus without incident. Transgastric imaging revealed normal LV function with no RWMA;s and no mural apical thrombus.  .  Estimated ejection fraction was 65%.  Right sided cardiac chambers were normal with no evidence of pulmonary hypertension.  The pulmonary and tricuspid valves were structurally normal.  There was mild TR The mitral valve was structurally normal with trivial mitral regurgitation.    The aortic valve was trileaflet with trivial AR The aortic root was normal.    There was a mobile atrial septal aneurysm  There was a PFO by 2D and color flow.  There was a large right to left shunt by bubble study  The LAA was well visualized in orthogonal views.  There was no spontaneous contrast and no thrombus.    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

## 2014-06-03 NOTE — Progress Notes (Signed)
Echocardiogram Echocardiogram Transesophageal has been performed.  Rebecca Russo 06/03/2014, 3:17 PM

## 2014-06-03 NOTE — Progress Notes (Signed)
Stroke Team Progress Note  HISTORY Rebecca Russo is an 39 y.o. female who went to sleep last night 05/28/2014 at around 2200 hours which is the last time she seen normal. This AM at 0800 hours she was heard falling out of her bed. Her son came into the room and found her on the floor in her PJ's with right arm and leg flaccidity and left gaze deviation. Patient was brought to CT scan where a left MCA sign was visualized. Due to age and likely a wake up stroke a CT perfusion was obtained showing large area of viable brain. Patient was brought straight to IR. tPA was not given as she was out of window  SUBJECTIVE No family at bedside. Pt is doing well with no complains. Still expressive aphasia. For TEE today at 3p. Looking forward to discharge later today.   OBJECTIVE Most recent Vital Signs: Filed Vitals:   06/02/14 1752 06/02/14 2112 06/03/14 0205 06/03/14 0547  BP: 119/73 120/75 118/78 125/70  Pulse: 90 85 81 87  Temp: 98.3 F (36.8 C) 98.4 F (36.9 C) 98.7 F (37.1 C) 98.5 F (36.9 C)  TempSrc: Oral Oral Oral Oral  Resp: 20 18 18 20   Height:      Weight:      SpO2: 100% 100% 99% 97%   CBG (last 3)   Recent Labs  06/01/14 0650 06/01/14 1157 06/01/14 1634  GLUCAP 110* 104* 98    IV Fluid Intake:      MEDICATIONS  . aspirin  325 mg Oral Daily  . enoxaparin (LOVENOX) injection  40 mg Subcutaneous Q12H  . pantoprazole  40 mg Oral Daily  . potassium chloride  20 mEq Oral BID  . rosuvastatin  5 mg Oral q1800   PRN:  acetaminophen, butalbital-acetaminophen-caffeine, loperamide, RESOURCE THICKENUP CLEAR, senna-docusate  Diet:  Diet NPO time specified  Activity:  Up as tolerated DVT Prophylaxis:  Lovenox 40 mg sq daily   CLINICALLY SIGNIFICANT STUDIES Basic Metabolic Panel:   Recent Labs Lab 05/30/14 0345 05/31/14 0235 06/01/14 0552 06/03/14 0626  NA 137 139 139 140  K 3.5 3.4* 3.7 3.8  CL 109 111 113* 110  CO2 22 24 21  18*  GLUCOSE 109* 104* 107* 96  BUN 6  5* 10 13  CREATININE 0.67 0.71 0.83 0.79  CALCIUM 8.4 8.8 9.5 9.2  MG 1.8 2.0  --   --   PHOS 2.9 2.9  --   --    Liver Function Tests:   Recent Labs Lab 05/29/14 0900  AST 29  ALT 22  ALKPHOS 73  BILITOT 0.4  PROT 7.2  ALBUMIN 3.5   CBC:   Recent Labs Lab 05/31/14 0235 06/01/14 0552 06/03/14 0626  WBC 15.3* 16.1* 9.0  NEUTROABS 11.0* 11.0*  --   HGB 10.8* 11.4* 11.5*  HCT 32.6* 34.4* 33.3*  MCV 89.3 90.3 88.6  PLT 395 428* 431*   Coagulation:   Recent Labs Lab 05/29/14 0900  LABPROT 13.5  INR 1.02   Cardiac Enzymes: No results for input(s): CKTOTAL, CKMB, CKMBINDEX, TROPONINI in the last 168 hours. Urinalysis:   Recent Labs Lab 05/29/14 0942  COLORURINE YELLOW  LABSPEC 1.029  PHURINE 5.0  GLUCOSEU NEGATIVE  HGBUR NEGATIVE  BILIRUBINUR NEGATIVE  KETONESUR NEGATIVE  PROTEINUR NEGATIVE  UROBILINOGEN 0.2  NITRITE NEGATIVE  LEUKOCYTESUR NEGATIVE   Lipid Panel    Component Value Date/Time   CHOL 173 05/30/2014 1200   TRIG 225* 05/30/2014 1200   HDL 31*  05/30/2014 1200   CHOLHDL 5.6 05/30/2014 1200   VLDL 45* 05/30/2014 1200   LDLCALC 97 05/30/2014 1200   HgbA1C  Lab Results  Component Value Date   HGBA1C 5.6 05/30/2014   Urine Drug Screen:      Component Value Date/Time   LABOPIA NONE DETECTED 05/29/2014 0942   COCAINSCRNUR NONE DETECTED 05/29/2014 0942   LABBENZ NONE DETECTED 05/29/2014 0942   AMPHETMU NONE DETECTED 05/29/2014 0942   THCU NONE DETECTED 05/29/2014 0942   LABBARB NONE DETECTED 05/29/2014 0942    Alcohol Level:   Recent Labs Lab 05/29/14 0900  ETH <5    Ct Head Wo Contrast 05/30/2014    Subtle CT changes of left MCA territory infarct, most apparent at the insula, with no associated hemorrhage or mass effect. No new intracranial abnormality.    05/29/2014   Subtle decreased sulcation in the left cerebral hemisphere raising the question for diffuse edema. No evidence for acute intracranial hemorrhage.    05/29/2014    Motion degraded exam showing no definite acute stroke or hemorrhage. Hyperdense LEFT ICA suggesting proximal large vessel occlusion    Ct Cerebral Perfusion W/cm 05/29/2014   There could be a moderate-sized ischemic penumbra in the LEFT hemisphere in this patient with suspected LEFT ICA occlusion.  At the time of dictation, the patient is in interventional radiology in preparation for possible thrombolysis.    Cerebral Angiogram 05/29/2014 LT ICA and LT T occlusion in isolated Lt cerebral hemisphere. S/P complete TIC 3 revascularization using 2 passes with Trevoprovue 4 mm x 30 mm retrieval device and 8 mg of superselective intracranial intraarterial Integrelin.  Dg Chest Port 1 View 05/31/2014 Extubation, with low lung volumes but no acute disease. 05/30/2014   Low lung volumes with mild right basilar atelectasis.     Dg Abd Portable 1v 05/29/2014    1. OG tube tip in the proximal body of the stomach. 2. Early/incipient small bowel ileus.     MRI/MRA of the brain  Patient refused  CTA head and neck 06/01/14 1. Collected evolution of infarcts involving the left insular cortex and segmental areas of the left anterior MCA distribution. This territory is significantly smaller than the area of ischemia identified on the CT perfusion scan. 2. Persistent patency of the left internal carotid artery. 3. Slight irregularity of the distal left ICA. This may represent fibromuscular dysplasia. 4. Evidence luxury perfusion within the infarcted territories of the left frontal lobe.  2D Echocardiogram   - Left ventricle: The cavity size was normal. Wall thickness was normal. Systolic function was normal. The estimated ejection fraction was in the range of 55% to 60%. Wall motion was normal; there were no regional wall motion abnormalities. Dopplerparameters are consistent with abnormal left ventricularrelaxation (grade 1 diastolic dysfunction).  Impressions: No cardiac source of emboli was indentified.  LE  venous doppler - negative for DVT  EKG  Sinus rhythm. Borderline T wave abnormalities  TCD Bubble positive for large PFO  TEE  pending    PHYSICAL EXAM General: The patient is alert and cooperative at the time of the examination. The patient is morbidly obese. Respiratory: Lung fields are clear, with the exception of occasional posterior rhonchi Cardiovascular: Regular rate and rhythm, no obvious murmurs or rubs Abdomen: Obese, nontender, positive bowel sounds Skin: No significant peripheral edema is noted.  Mental status: The patient is alert and cooperative, expressive aphasia, not able naming and repeating, comprehension intact. Cranial nerves: Right nasolabial fold flattening, right facial droop. Tongue  in middle. PERRL, Extraocular movements are full. Visual fields are full. Motor: The patient has good strength in all 4 extremities. Sensory examination: Patient indicates that there is good symmetry was soft touch on the face, arms, and legs. Coordination: The cerebellar testing was unremarkable with finger-nose-finger, and toe to finger bilaterally. Gait and station: Normal gait with PT. Reflexes: Deep tendon reflexes are symmetric, but are depressed throughout.   ASSESSMENT Rebecca Russo is a 39 y.o. female presenting with right hemiparesis, aphasia and left gaze on waking up. The patient did not receive TPA secondary to unknown time of onset. Perfusion scan revealed left brain large penumbra, the patient underwent clot extraction with TICI3 recannulization. The patient has regained function of all 4 extremities, following verbal commands.   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  TEE to look for embolic source. Arranged with [facility] for today at 3p.   No loop given young age and low yield   Lower extremity doppler - negative for DVT.   TCD bubble study positive for PFO   Autoimmune and Hypercoagulable state workup so far negative, some pending. Sickle cell screening pending.  On no antithrombotics prior to admission. Started on aspirin 325 mg daily.  HgbA1c 5.6, at the goal  Was taking oral contraceptives prior to admission but denies smoking. She has been advised not to take at discharge and find another, non-hormonal method  Therapy Recommendations - No PT or OT at d/c,  OP ST for swallow and language (ordered)  Anticipate discharge today following TEE  Hyperlipidemia  LDL 89, not at goal  Nomads at home  On Crestor 5 mg  Continue discharge  Other risk factor for stroke  Morbid obesity, Body mass index is 49.65 kg/(m^2). weight loss advised  Hx migraine headaches  Family hx stroke (father)  Taking OCP prior to admission (see above)  Diarrhea   cdiff neg  imodium helps  Symptoms resolved  Headaches  CTA head and neck did not show dissection  fiorecet for HA  improved  Leukocytosis - resolved  Afebrile  CXR ok 1/17  UA neg 1/15.   No abx  WBC repeat today 9.0  blood cultures 06/02/2014 no growth thus far  Hospital day # 5   SIGNED [facility]oderick [facility] See Amion for Pager information 06/03/2014 9:30 AM   I, the attending vascular neurologist, have personally obtained a history, examined the patient, evaluated laboratory data, individually viewed imaging studies and agree with radiology interpretations. Together with the NP/PA, we formulated the assessment and plan of care which reflects our mutual decision.  I have made any additions or clarifications directly to the above note and agree with the findings and plan as currently  documented.   39 year old female with no significant past medical history presented with right hemiparesis, left gaze, aphasia. Cerebral angiogram showed left terminal ICA occlusion status post mechanical thrombectomy with TICI3 recannulization. Repeat CTA head and neck showed patent vessels. Etiology for stroke not clear at this moment, stroke work up so far negative. Risk factor including OCP, obesity, large PFO.  Waiting for TEE today. Continue aspirin and statin for stroke prevention. Continue speech  therapy. D/c after TEE. Follow up in clinic to consider possible PFO closure.  Marvel Plan, MD PhD Stroke Neurology 06/03/2014 12:34 PM       To contact Stroke Continuity provider, please refer to [email or web address]. After hours, contact General Neurology

## 2014-06-04 ENCOUNTER — Encounter (HOSPITAL_COMMUNITY): Payer: Self-pay | Admitting: Cardiovascular Disease

## 2014-06-04 DIAGNOSIS — E785 Hyperlipidemia, unspecified: Secondary | ICD-10-CM | POA: Diagnosis present

## 2014-06-04 DIAGNOSIS — I253 Aneurysm of heart: Secondary | ICD-10-CM

## 2014-06-04 DIAGNOSIS — Q211 Atrial septal defect: Secondary | ICD-10-CM

## 2014-06-04 DIAGNOSIS — E669 Obesity, unspecified: Secondary | ICD-10-CM | POA: Diagnosis present

## 2014-06-04 DIAGNOSIS — R1314 Dysphagia, pharyngoesophageal phase: Secondary | ICD-10-CM | POA: Diagnosis present

## 2014-06-04 HISTORY — DX: Aneurysm of heart: I25.3

## 2014-06-04 HISTORY — DX: Atrial septal defect: Q21.1

## 2014-06-04 LAB — SICKLE CELL SCREEN: SICKLE CELL SCREEN: NEGATIVE

## 2014-06-04 NOTE — Progress Notes (Signed)
Patient is discharged from room 4N03 at this time. Alert and in stable condition. IV site d/c'd as well as tele. Instructions read to patient ans understanding verbalized. Left unit via wheelchair with all belongings and family at side.

## 2014-06-04 NOTE — Discharge Summary (Signed)
Stroke Discharge Summary  Patient ID: Rebecca Russo   MRN: [id number]      DOB: [birth year]  Date of Admission: 05/29/2014 Date of Discharge: 06/04/2014  Attending Physician:  Marvel Plan, MD, Stroke MD  Consulting Physician(s):    Dr. 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 weight loss advised  OCP use  Diarrhea - resolved  Headaches - resolved  Leukocytosis - resolved  Morbid obesity,  BMI: Body mass index is 49.65 kg/(m^2).  Past Medical History  Diagnosis Date  . H/O varicella   . Increased BMI 12/17/08   Past Surgical History  Procedure Laterality Date  . Laparoscopic cholecystectomy  06/20/2012  . Radiology with anesthesia N/A 05/29/2014    Procedure: RADIOLOGY WITH ANESTHESIA;  Surgeon: Oneal Grout, MD;  Location: [facility] OR;  Service: Radiology;  Laterality: N/A;  . Tee without cardioversion N/A 06/03/2014    Procedure: TRANSESOPHAGEAL ECHOCARDIOGRAM (TEE);  Surgeon: Wendall Stade, MD;  Location: [facility] END[facility]COPY;  Service: Cardiovascular;  Laterality: N/A;      Medication List    TAKE these medications        aspirin 325 MG tablet  Take 1 tablet (325 mg total) by mouth daily.     rosuvastatin 5 MG tablet  Commonly known as:  CRESTOR  Take 1 tablet (5 mg total) by mouth daily at 6 PM.        LABORATORY STUDIES CBC    Component Value Date/Time   WBC 9.0 06/03/2014 0626   RBC 3.76* 06/03/2014 0626   HGB 11.5* 06/03/2014 0626   HCT 33.3* 06/03/2014 0626   PLT  431* 06/03/2014 0626   MCV 88.6 06/03/2014 0626   MCH 30.6 06/03/2014 0626   MCHC 34.5 06/03/2014 0626   RDW 13.9 06/03/2014 0626   LYMPHSABS 3.7 06/01/2014 0552   MONOABS 0.9 06/01/2014 0552   EOSABS 0.4 06/01/2014 0552   BASOSABS 0.0 06/01/2014 0552   CMP    Component Value Date/Time   NA 140 06/03/2014 0626   K 3.8 06/03/2014 0626   CL 110 06/03/2014 0626   CO2 18* 06/03/2014 0626   GLUCOSE 96 06/03/2014 0626   BUN 13 06/03/2014 0626   CREATININE 0.79 06/03/2014 0626   CALCIUM 9.2 06/03/2014 0626   PROT 7.2 05/29/2014 0900   ALBUMIN 3.5 05/29/2014 0900   AST 29 05/29/2014 0900   ALT 22 05/29/2014 0900   ALKPHOS 73 05/29/2014 0900   BILITOT 0.4 05/29/2014 0900   GFRNONAA >90 06/03/2014 0626   GFRAA >90 06/03/2014 0626   COAGS Lab Results  Component Value Date   INR 1.02 05/29/2014   Lipid Panel    Component Value Date/Time   CHOL 173 05/30/2014 1200   TRIG 225* 05/30/2014 1200   HDL 31* 05/30/2014 1200   CHOLHDL 5.6 05/30/2014 1200   VLDL 45* 05/30/2014 1200   LDLCALC 97 05/30/2014 1200   HgbA1C  Lab Results  Component Value Date   HGBA1C 5.6 05/30/2014   Cardiac Panel (last 3 results) No results for input(s): CKTOTAL, CKMB, TROPONINI, RELINDX in the last 72 hours.  Urinalysis    Component Value Date/Time   COLORURINE YELLOW 05/29/2014 0942   APPEARANCEUR CLEAR 05/29/2014 0942   LABSPEC 1.029 05/29/2014 0942   PHURINE 5.0 05/29/2014 0942   GLUCOSEU NEGATIVE 05/29/2014 0942   HGBUR NEGATIVE 05/29/2014 0942   BILIRUBINUR NEGATIVE 05/29/2014 0942   KETONESUR NEGATIVE 05/29/2014 0942   PROTEINUR NEGATIVE 05/29/2014 0942   UROBILINOGEN 0.2 05/29/2014 0942   NITRITE NEGATIVE 05/29/2014 0942   LEUKOCYTESUR NEGATIVE 05/29/2014 0942   Urine Drug Screen     Component Value Date/Time   LABOPIA NONE DETECTED 05/29/2014 0942   COCAINSCRNUR NONE DETECTED 05/29/2014 0942   LABBENZ NONE DETECTED 05/29/2014 0942   AMPHETMU NONE DETECTED 05/29/2014 0942   THCU  NONE DETECTED 05/29/2014 0942   LABBARB NONE DETECTED 05/29/2014 0942    Alcohol Level    Component Value Date/Time   ETH <5 05/29/2014 0900   Component     Latest Ref Rng 05/29/2014 06/02/2014 06/03/2014  PTT Lupus Anticoagulant     28.0 - 43.0 secs 34.9    PTTLA Confirmation     <8.0 secs NOT APPL    PTTLA 4:1 Mix     28.0 - 43.0 secs NOT APPL    DRVVT     <42.9 secs 35.0    Drvvt confirmation     <1.15 Ratio NOT APPL    dRVVT Incubated 1:1 Mix     <42.9 secs NOT APPL    Lupus Anticoagulant     NOT DETECTED NOT DETECTED    Beta-2 Glyco I IgG     <20 G Units 14    Beta-2-Glycoprotein I IgM     <20 M Units 6    Beta-2-Glycoprotein I IgA     <20 A Units 6    Interpretation-F5LEID:      REPORT    Recommendations-F5LEID:      REPORT    Reviewer      REPORT    Interpretation-PTGENE:      REPORT    Recommendations-PTGENE:      REPORT    Reviewer      REPORT    Anticardiolipin IgG     <23 GPL U/mL 3 (L)    Anticardiolipin IgM     <11 MPL U/mL 4 (L)    Anticardiolipin IgA     <22 APL U/mL 10 (L)    c-ANCA Screen     NEGATIVE  NEGATIVE   p-ANCA Screen     NEGATIVE  NEGATIVE   Atypical p-ANCA Screen     NEGATIVE  NEGATIVE   AntiThromb III Func     75 - 120 % 105    Protein C Activity     74 - 151 % 174 (H)    Protein C, Total     70 - 140 % 110    Protein S Activity     60 - 145 % 113    Protein S Ag, Total     58 - 150 % 128    Homocysteine     0.0 - 15.0 umol/L 8.1    ds DNA Ab       1   C3 Complement     90 - 180 mg/dL  244 (H)   Complement C4, Body Fluid     10 - 40 mg/dL  38   SSA (Ro) (ENA) Antibody, IgG     <1.0 NEG AI  <1.0 NEG   SSB (La) (ENA) Antibody, IgG     <1.0  NEG AI  <1.0 NEG   Rhuematoid fact SerPl-aCnc     <=14 IU/mL  <10   ANA Ser Ql     NEGATIVE  NEGATIVE   Sickle Cell Screen     NEGATIVE   NEGATIVE   SIGNIFICANT DIAGNOSTIC STUDIES  Ct Head Wo Contrast 05/30/2014 Subtle CT changes of left MCA territory infarct, most  apparent at the insula, with no associated hemorrhage or mass effect. No new intracranial abnormality.  05/29/2014 Subtle decreased sulcation in the left cerebral hemisphere raising the question for diffuse edema. No evidence for acute intracranial hemorrhage.  05/29/2014 Motion degraded exam showing no definite acute stroke or hemorrhage. Hyperdense LEFT ICA suggesting proximal large vessel occlusion   Ct Cerebral Perfusion W/cm 05/29/2014 There could be a moderate-sized ischemic penumbra in the LEFT hemisphere in this patient with suspected LEFT ICA occlusion. At the time of dictation, the patient is in interventional radiology in preparation for possible thrombolysis.   Cerebral Angiogram 05/29/2014 LT ICA and LT T occlusion in isolated Lt cerebral hemisphere. S/P complete TIC 3 revascularization using 2 passes with Trevoprovue 4 mm x 30 mm retrieval device and 8 mg of superselective intracranial intraarterial Integrelin.  Dg Chest Port 1 View 05/31/2014 Extubation, with low lung volumes but no acute disease. 05/30/2014 Low lung volumes with mild right basilar atelectasis.   Dg Abd Portable 1v 05/29/2014 1. OG tube tip in the proximal body of the stomach. 2. Early/incipient small bowel ileus.   MRI/MRA of the brain Patient refused  CTA head and neck 06/01/14 1. Collected evolution of infarcts involving the left insular cortex and segmental areas of the left anterior MCA distribution. This territory is significantly smaller than the area of ischemia identified on the CT perfusion scan. 2. Persistent patency of the left internal carotid artery. 3. Slight irregularity of the distal left ICA. This may represent fibromuscular dysplasia. 4. Evidence luxury perfusion within the infarcted territories of the left frontal lobe.  2D Echocardiogram  - Left ventricle: The cavity size was normal. Wall thickness was normal. Systolic function was normal. The estimated ejection  fraction was in the range of 55% to 60%. Wall motion was normal; there were no regional wall motion abnormalities. Dopplerparameters are consistent with abnormal left ventricularrelaxation (grade 1 diastolic dysfunction). Impressions: No cardiac source of emboli was indentified.  LE venous doppler - negative for DVT  EKG Sinus rhythm. 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 female who went to sleep last night 05/28/2014 at around 2200 hours which is the last time she seen normal. This AM at 0800 hours she was heard falling out of her bed. Her son came into the room and found her on the floor in her PJ's with right arm and leg flaccidity and left gaze deviation. Patient was brought to CT scan where a left MCA sign was visualized. Due to age and likely a wake up stroke a CT perfusion was obtained showing large area of viable brain. Patient was brought straight to IR. tPA was not given as she was out of window    HOSPITAL COURSE Rebecca Russo is a 39 y.o. female with no significant past  medical history presenting with right hemiparesis, aphasia and left gaze on waking up. The patient did not receive TPA secondary to unknown time of onset. Perfusion scan revealed left brain large penumbra. Cerebral angiogram showed left terminal ICA occlusion. She is status post mechanical thrombectomy with TICI3 recannulization.   Repeat CTA head and neck showed patent vessels. Etiology for stroke not clear, stroke work up so far negative except large PFO with mobile ASA. Risk factor including OCP, obesity, large PFO with mobile ASA. Continue aspirin and statin for stroke prevention. Continue speech therapy. Follow up in clinic  and with Dr. Excell Seltzer to consider possible PFO closure.  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 The patient has regained function of all 4 extremities, following verbal commands.   MRI - patient refused  Cerebral angiogram - concerning for proximal ICA dissection on the left, TICI3 recannulization of left proximal and terminal ICA  CTA head and neck - left MCA territory stroke, however vessel imaging and remarkable.  No loop given young age and low yield  Lower extremity doppler - negative for DVT.  TCD bubble study positive for PFO  TEE shows mobile ASA and large PFO with R to L shunt  Autoimmune and Hypercoagulable state workup so far negative, some pending. Sickle cell screening pending.  On no antithrombotics prior to admission. Started on aspirin 325 mg daily.  HgbA1c 5.6, at the goal  Was taking oral contraceptives prior to admission but denies smoking. She has been advised not to take at discharge and find another, non-hormonal method  Therapy Recommendations - No PT or OT at d/c, OP ST for swallow and language   discharge home with OP ST  Hyperlipidemia  LDL 89, not at goal  No meds at home  Started on Crestor 5 mg  Continued discharge  Other risk factor for stroke  Morbid obesity, Body mass index is 49.65 kg/(m^2). weight loss advised  Hx migraine headaches  Family hx stroke (father)  Taking OCP prior to admission but no smoking. Recommended discontinuation OCP and other means for contraception.  Diarrhea - resolved  cdiff neg  imodium helped  Headaches - resolved  CTA head and neck did not show dissection  fiorecet for HA  Leukocytosis - resolved  Afebrile  CXR ok 1/17  UA neg 1/15.   No abx  blood cultures 06/02/2014 no growth    DISCHARGE EXAM Blood pressure 107/69, pulse 81, temperature 98.2 F (36.8 C), temperature source  Oral, resp. rate 20, height  (1.778 m), weight 156.945 kg (346 lb), SpO2 97 %.  General: The patient is alert and cooperative at the time of the examination. The patient is morbidly obese. Respiratory: Lung fields are clear, with the exception of occasional posterior rhonchi Cardiovascular: Regular rate and rhythm, no obvious murmurs or rubs Abdomen: Obese, nontender, positive bowel sounds Skin: No significant peripheral edema is noted.  Mental status: The patient is alert and cooperative, expressive aphasia, not able to name and repea, comprehension intact. Cranial nerves: Right nasolabial fold flattening, right facial droop. Tongue in middle. PERRL, Extraocular movements are full. Visual fields are full. Motor: The patient has good strength in all 4 extremities. Sensory examination: Patient indicates that there is good symmetry was soft touch on the face, arms, and legs. Coordination: The cerebellar testing was unremarkable with finger-nose-finger, and toe to finger bilaterally. Gait and station: Normal gait with PT. Reflexes: Deep tendon reflexes are  symmetric, but are depressed throughout.   Discharge Diet   Diet Heart nectar thick liquids   DISCHARGE PLAN  Disposition:  Home w/ OP ST   aspirin 325 mg orally every day for secondary stroke prevention.  Follow-up OWENS,REBECCA, FNP in 2 weeks.  Follow-up with Dr. Marvel Plan in 2 months.  Follow-up Dr. Excell Seltzer in the future to discuss PFO closure  35 minutes were spent preparing discharge.  Rhoderick Moody [facility] See Amion for Pager information 06/04/2014 10:51 AM

## 2014-06-08 LAB — CULTURE, BLOOD (ROUTINE X 2)
CULTURE: NO GROWTH
Culture: NO GROWTH

## 2014-06-08 LAB — ALPHA GALACTOSIDASE: ALPHA GALACTOSIDASE, SERUM: 0.262 U/L (ref 0.074–0.457)

## 2014-06-09 ENCOUNTER — Telehealth: Payer: Self-pay | Admitting: *Deleted

## 2014-06-09 ENCOUNTER — Ambulatory Visit: Payer: Managed Care, Other (non HMO) | Attending: Neurology | Admitting: Speech Pathology

## 2014-06-09 DIAGNOSIS — I69391 Dysphagia following cerebral infarction: Secondary | ICD-10-CM | POA: Insufficient documentation

## 2014-06-09 DIAGNOSIS — I69390 Apraxia following cerebral infarction: Secondary | ICD-10-CM | POA: Insufficient documentation

## 2014-06-09 DIAGNOSIS — I69320 Aphasia following cerebral infarction: Secondary | ICD-10-CM | POA: Insufficient documentation

## 2014-06-09 NOTE — Therapy (Signed)
[facility] [address] [place], [place], [address] Phone: [phone]   Fax:  [phone]  Speech Language Pathology Evaluation  Patient Details  Name: Rebecca Russo MRN: [id number] Date of Birth: [birth year] Referring Provider:  Claude Manges, FNP  Encounter Date: 06/09/2014      End of Session - 06/09/14 1503    Visit Number 1   Number of Visits 24   Date for SLP Re-Evaluation 08/04/14   SLP Start Time 1401   SLP Stop Time  1450   SLP Time Calculation (min) 49 min   Activity Tolerance Patient tolerated treatment well      Past Medical History  Diagnosis Date  . H/O varicella   . Increased BMI 12/17/08    Past Surgical History  Procedure Laterality Date  . Laparoscopic cholecystectomy  06/20/2012  . Radiology with anesthesia N/A 05/29/2014    Procedure: RADIOLOGY WITH ANESTHESIA;  Surgeon: Oneal Grout, MD;  Location: [facility] OR;  Service: Radiology;  Laterality: N/A;  . 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 No/denies          SLP Evaluation [facility] - 06/09/14 1409    SLP Visit Information   SLP Received On 06/09/14   Onset Date 05/29/14   Medical Diagnosis CVA   Subjective   Patient/Family Stated Goal "To talk"   General Information   HPI Rebecca Russo is an 39 y.o. female who went to sleep 05/28/14 around 2200 hours which is the last time she was seen normal. The next morning at 0800 hours she was heard falling out of her bed. Her son came into the room and found her on the floor in her PJ's with right arm and leg flaccidity and left gaze  deviation. Patient was brought to CT scan where a left MCA sign was visualized. Due to age and likely a wake up stroke a CT perfusion was obtained showing large area of viable brain. Patient was brought straight to IR. Pt was intubated 1/15-1/16. Pt demonstrating continued infrequent and subtle signs aspiration with thin at bedside, therefore recommend MBS to determine safety for return to thin liquids.   Prior Functional Status   Cognitive/Linguistic Baseline Within functional limits   Type of Home House    Lives With Son   Available Support Family   Vocation Full time employment   Auditory Comprehension   Overall Auditory Comprehension Impaired   Yes/No Questions Impaired   Basic Biographical Questions 76-100% accurate   Basic Immediate Environment Questions 50-74% accurate   Complex Questions 25-49% accurate   Commands Impaired   One Step Basic Commands 75-100% accurate   Two Step Basic Commands 50-74% accurate   Multistep Basic Commands 25-49% accurate   Conversation Simple   EffectiveTechniques Extra processing time;Repetition;Slowed speech;Visual/Gestural cues   Reading Comprehension   Reading Status Impaired   Word level 76-100% accurate   Sentence Level 26-50% accurate   Paragraph Level Not tested   Functional Environmental (signs, name badge) Not tested   Expression   Primary Mode of Expression Verbal   Verbal Expression   Overall  Verbal Expression Impaired   Automatic Speech Month of year   Level of Generative/Spontaneous Verbalization Word   Repetition Impaired   Level of Impairment Phrase level   Naming Impairment   Responsive 51-75% accurate   Confrontation 75-100% accurate   Pictures Able in field of 3   Convergent 25-49% accurate   Divergent 0-24% accurate   Other Naming Comments Named 1 item in category with semantic cue   Verbal Errors Semantic paraphasias;Phonemic paraphasias;Aware of errors   Pragmatics No impairment   Effective Techniques Semantic  cues;Phonemic cues   Written Expression   Dominant Hand Right   Written Expression Exceptions to [facility]   Copy Ability Word   Self Formulation Ability Word  biographical info   Overall Writen Expression Impaired at biographical info    Oral Motor/Sensory Function   Overall Oral Motor/Sensory Function Impaired   Labial ROM Reduced right   Labial Symmetry Abnormal symmetry right   Motor Speech   Overall Motor Speech Appears within functional limits for tasks assessed   Intelligibility Intelligible   Motor Planning Impaired   Level of Impairment Phrase   Motor Speech Errors Groping for words;Inconsistent;Aware   Effective Techniques Pause;Slow rate   Assessment   Therapy Diagnosis Aphasia   Clinical Impression Statement Rebecca Russo presents with 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 Verbal expression is at word level with some halting and groping and verbal perseveration. Confrontation and responsive naming 80% with min A. Semantic cues efffective. Auditory comprehensoin intact to simple yes/no questions and 2 step commands. Complex yes/no questions and 3 step commands were less than 50% accurate. Repetition intact to word level, with groping at phrase level. Reading comprehension is intact to word level. Written expression is impaired at biographical information. I recommend skilled ST to maximize verbal expression, auditory and reading comprehension and written expression. Pt does have right labial weakness, recommend HEP for this. Rebecca Russo also demonstrated right hand clumsiness and weakness during writing tasks. She agreed that she does have right hand weakness. I also recommend OT consult.    SLP Recommendation/Assessment Patient needs continued Speech Lanaguage Pathology Services   Problem List Auditory comprehension;Reading comprehension;Verbal expression   Plan   Speech Therapy Frequency (ACUTE ONLY) min 3x week   Duration Other (comment)   Treatment/Interventions  Language facilitation;Environmental controls;Cueing hierarchy;Oral motor exercises;Functional tasks;SLP instruction and feedback;Compensatory strategies;Patient/family education   Potential to Achieve Goals (ACUTE ONLY) Good             SLP Education - 06/09/14 1502    Education provided Yes   Education Details goals for ST, compensations for aphasia, reduced auditory comprehension - instructed mom to be with pt when she receives medical, financial or legal information   Person(s) Educated Patient;Parent(s)   Methods Explanation;Demonstration;Handout   Comprehension Verbalized understanding          SLP Short Term Goals - 06/09/14 1505    SLP SHORT TERM GOAL #1   Title Pt will perform simple convergent and divergent naming tasks with 85% accuracy and occassional minimal assistance (due 07/10/14)   Time 4   Period Weeks   Status New   SLP SHORT TERM GOAL #2   Title Pt will demonstrate auditory comprehension of simple conversation and questions with 85% accuracy and occassional minimal assistance (07/10/14)   Time 4   Period Weeks   Status New   SLP SHORT TERM GOAL #3   Title Pt will id object to phrase/sentence description with 85% accuracy and  rare minimal assistance (07/10/14)   Time 4   Period Weeks   Status New   SLP SHORT TERM GOAL #4   Title ID object/picture to written description f:4 with 80% acccuracy and rare minimal assistance (07/10/14)   Time 4   Period Weeks   Status New          SLP Long Term Goals - 06/09/14 1509    SLP LONG TERM GOAL #1   Title Demonsrate compensations for aphasia during simple conversation 80% of opportunities and occassional minimal assistance (08/04/14)   Time 8   Period Weeks   Status New   SLP LONG TERM GOAL #2   Title Participate in simple covnersation verbally with 80% accuracy and occassional minimal assistance (08/04/14)   Time 8   Period Weeks   Status New   SLP LONG TERM GOAL #3   Title Comprehend simple 3-4 sentence  paragraph with 80% accuracy and occassional min A. (08/04/14)   Time 8   Period Weeks   Status New   SLP LONG TERM GOAL #4   Title Perform simple functional written expression tasks with 80% accuracy and occassional minimal A (08/04/14)   Time 8   Period Weeks   Status New          Plan - 06/09/14 1505    Speech Therapy Frequency 3x / week   Duration Other (comment)        Problem List Patient Active Problem List   Diagnosis Date Noted  . Morbid obesity 06/04/2014  . Dysphagia, pharyngoesophageal phase 06/04/2014  . PFO with atrial septal aneurysm 06/04/2014  . Hyperlipidemia LDL goal <70 06/04/2014  . Ischemic brain damage   . Acute respiratory failure with hypoxia   . CVA (cerebral vascular accident) 05/29/2014  . Aphasia complicating stroke 05/29/2014  . Hemiplegia affecting dominant side 05/29/2014  . CONSTIPATION 03/09/2008  . IRRITABLE BOWEL SYNDROME 03/09/2008    Lovvorn, Radene Journey, SLP 06/09/2014, 3:15 PM  [facility] [address] [place], [place], [address] Phone: [phone]   Fax:  [phone]

## 2014-06-09 NOTE — Patient Instructions (Addendum)
Tips for Talking with People who have Aphasia  . Say one thing at a time . Don't  rush - slow down, be patient . Reduce background noise . Relax - be natural . Use pen and paper . Write down key words . Draw diagrams or pictures . Don't pretend you understand . Ask what helps . Recap - check you both understand   Describing words  What group does it belong to?  What do I use it for?  Where can I find it?  What does it LOOK like?  What other words go with it?  What is the 1st sound of the word?  Many Ways to Communicate  Describe it Write it Draw it Gesture it Use related words  There's an App for that: Family Feud, Heads up, Stop-fun categories, What if, Junie Bame  Provided by: Vanna Scotland, [phone]   Homework packet from [facility] book provided

## 2014-06-09 NOTE — Telephone Encounter (Signed)
Form, [employer] to [place] and Dr [name] to be completed 06-09-14.

## 2014-06-10 ENCOUNTER — Ambulatory Visit: Payer: Managed Care, Other (non HMO)

## 2014-06-10 DIAGNOSIS — I69320 Aphasia following cerebral infarction: Secondary | ICD-10-CM | POA: Diagnosis not present

## 2014-06-10 DIAGNOSIS — R482 Apraxia: Secondary | ICD-10-CM

## 2014-06-10 NOTE — Therapy (Signed)
[facility] [address] [place], [place], [address] Phone: [phone]   Fax:  [phone]  Speech Language Pathology Treatment  Patient Details  Name: Rebecca Russo MRN: [id number] Date of Birth: 03-Sep-1975 Referring Provider:  Claude Manges, FNP  Encounter Date: 06/10/2014      End of Session - 06/10/14 0935    Visit Number 2   Number of Visits 24   Date for SLP Re-Evaluation 08/04/14   SLP Start Time 0851   SLP Stop Time  0931   SLP Time Calculation (min) 40 min      Past Medical History  Diagnosis Date  . H/O varicella   . Increased BMI 12/17/08    Past Surgical History  Procedure Laterality Date  . Laparoscopic cholecystectomy  06/20/2012  . Radiology with anesthesia N/A 05/29/2014    Procedure: RADIOLOGY WITH ANESTHESIA;  Surgeon: Rebecca Grout, MD;  Location: [facility] OR;  Service: Radiology;  Laterality: N/A;  . 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          SLP Evaluation [facility] - 06/09/14 1409    SLP Visit Information   SLP Received On 06/09/14   Onset Date 05/29/14   Medical Diagnosis CVA   Subjective   Patient/Family Stated Goal "To talk"   General Information   HPI Rebecca Russo is an 39 y.o. female who went to sleep 05/28/14 around 2200 hours which is the last time she was seen normal. The next morning at 0800 hours she was heard falling out of her bed. Her son came into the room and found her on the floor in her PJ's with right arm and leg flaccidity and left gaze deviation. Patient was brought to CT scan  where a left MCA sign was visualized. Due to age and likely a wake up stroke a CT perfusion was obtained showing large area of viable brain. Patient was brought straight to IR. Pt was intubated 1/15-1/16. Pt demonstrating continued infrequent and subtle signs aspiration with thin at bedside, therefore recommend MBS to determine safety for return to thin liquids.   Prior Functional Status   Cognitive/Linguistic Baseline Within functional limits   Type of Home House    Lives With Son   Available Support Family   Vocation Full time employment   Auditory Comprehension   Overall Auditory Comprehension Impaired   Yes/No Questions Impaired   Basic Biographical Questions 76-100% accurate   Basic Immediate Environment Questions 50-74% accurate   Complex Questions 25-49% accurate   Commands Impaired   One Step Basic Commands 75-100% accurate   Two Step Basic Commands 50-74% accurate   Multistep Basic Commands 25-49% accurate   Conversation Simple   EffectiveTechniques Extra processing time;Repetition;Slowed speech;Visual/Gestural cues   Reading Comprehension   Reading Status Impaired   Word level 76-100% accurate   Sentence Level 26-50% accurate   Paragraph Level Not tested   Functional Environmental (signs, name badge) Not tested   Expression   Primary Mode of Expression Verbal   Verbal Expression   Overall Verbal Expression Impaired   Automatic  Speech Month of year   Level of Generative/Spontaneous Verbalization Word   Repetition Impaired   Level of Impairment Phrase level   Naming Impairment   Responsive 51-75% accurate   Confrontation 75-100% accurate   Pictures Able in field of 3   Convergent 25-49% accurate   Divergent 0-24% accurate   Other Naming Comments Named 1 item in category with semantic cue   Verbal Errors Semantic paraphasias;Phonemic paraphasias;Aware of errors   Pragmatics No impairment   Effective Techniques Semantic cues;Phonemic cues   Written Expression    Dominant Hand Right   Written Expression Exceptions to [facility]   Copy Ability Word   Self Formulation Ability Word  biographical info   Overall Writen Expression Impaired at biographical info    Oral Motor/Sensory Function   Overall Oral Motor/Sensory Function Impaired   Labial ROM Reduced right   Labial Symmetry Abnormal symmetry right   Motor Speech   Overall Motor Speech Appears within functional limits for tasks assessed   Intelligibility Intelligible   Motor Planning Impaired   Level of Impairment Phrase   Motor Speech Errors Groping for words;Inconsistent;Aware   Effective Techniques Pause;Slow rate   Assessment   Therapy Diagnosis Aphasia   Clinical Impression Statement 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 Verbal expression is at word level with some halting and groping and verbal perseveration. Confrontation and responsive naming 80% with min A. Semantic cues efffective. Auditory comprehensoin intact to simple yes/no questions and 2 step commands. Complex yes/no questions and 3 step commands were less than 50% accurate. Repetition intact to word level, with groping at phrase level. Reading comprehension is intact to word level. Written expression is impaired at biographical information. I recommend skilled ST to maximize verbal expression, auditory and reading comprehension and written expression. Pt does have right labial weakness, recommend HEP for this. [name] also demonstrated right hand clumsiness and weakness during writing tasks. She agreed that she does have right hand weakness. I also recommend OT consult.    SLP Recommendation/Assessment Patient needs continued Speech Lanaguage Pathology Services   Problem List Auditory comprehension;Reading comprehension;Verbal expression   Plan   Speech Therapy Frequency (ACUTE ONLY) min 3x week   Duration Other (comment)   Treatment/Interventions Language facilitation;Environmental  controls;Cueing hierarchy;Oral motor exercises;Functional tasks;SLP instruction and feedback;Compensatory strategies;Patient/family education   Potential to Achieve Goals (ACUTE ONLY) Good           ADULT SLP TREATMENT - 06/10/14 0853    General Information   Behavior/Cognition Alert;Cooperative;Pleasant mood   Treatment Provided   Treatment provided Cognitive-Linquistic   Pain Assessment   Pain Assessment No/denies pain   Cognitive-Linquistic Treatment   Treatment focused on Apraxia;Aphasia   Skilled Treatment Encouraged pt to spend at least 4 sessions of 15-20 minutes per day at home on speech/language work. SLP provided more tasks for her to do at home.Divergent naming (naming category members) req'd mod-max A usually. Semantic paraphasias were noted occasionally, as well as perseveration (noted usually) in this task. Pt read responses with occasional apraxic errors.   Assessment / Recommendations / Plan   Plan Continue with current plan of care   Progression Toward Goals   Progression toward goals Progressing toward goals          SLP Education - 06/10/14 0935    Education provided Yes   Education Details need to complete homework/home tasks   Person(s) Educated Patient;Parent(s)   Methods Explanation   Comprehension Verbalized understanding  SLP Short Term Goals - 06/10/14 0936    SLP SHORT TERM GOAL #1   Title Pt will perform simple convergent and divergent naming tasks with 85% accuracy and occassional minimal assistance (due 07/10/14)   Time 4   Period Weeks   Status New   SLP SHORT TERM GOAL #2   Title Pt will demonstrate auditory comprehension of simple conversation and questions with 85% accuracy and occassional minimal assistance (07/10/14)   Time 4   Period Weeks   Status New   SLP SHORT TERM GOAL #3   Title Pt will id object to phrase/sentence description with 85% accuracy and rare minimal assistance (07/10/14)   Time 4   Period Weeks   Status  New   SLP SHORT TERM GOAL #4   Title ID object/picture to written description f:4 with 80% acccuracy and rare minimal assistance (07/10/14)   Time 4   Period Weeks   Status New          SLP Long Term Goals - 06/10/14 0936    SLP LONG TERM GOAL #1   Title Demonsrate compensations for aphasia during simple conversation 80% of opportunities and occassional minimal assistance (08/04/14)   Time 8   Period Weeks   Status New   SLP LONG TERM GOAL #2   Title Participate in simple covnersation verbally with 80% accuracy and occassional minimal assistance (08/04/14)   Time 8   Period Weeks   Status New   SLP LONG TERM GOAL #3   Title Comprehend simple 3-4 sentence paragraph with 80% accuracy and occassional min A. (08/04/14)   Time 8   Period Weeks   Status New   SLP LONG TERM GOAL #4   Title Perform simple functional written expression tasks with 80% accuracy and occassional minimal A (08/04/14)   Time 8   Period Weeks   Status New          Plan - 06/10/14 0936    Speech Therapy Frequency 3x / week   Duration --  8 weeks   Potential to Achieve Goals Good   Potential Considerations Severity of impairments        Problem List Patient Active Problem List   Diagnosis Date Noted  . Morbid obesity 06/04/2014  . Dysphagia, pharyngoesophageal phase 06/04/2014  . PFO with atrial septal aneurysm 06/04/2014  . Hyperlipidemia LDL goal <70 06/04/2014  . Ischemic brain damage   . Acute respiratory failure with hypoxia   . CVA (cerebral vascular accident) 05/29/2014  . Aphasia complicating stroke 05/29/2014  . Hemiplegia affecting dominant side 05/29/2014  . CONSTIPATION 03/09/2008  . IRRITABLE BOWEL SYNDROME 03/09/2008    Verdie Mosher, SLP 06/10/2014, 9:37 AM  [facility] [address] [place], [place], [address] Phone: [phone]   Fax:  [phone]

## 2014-06-10 NOTE — Telephone Encounter (Signed)
Patient's mom, Almer Fennessey @ [phone] returning someone's call regarding additional information needed for form.  Please call and advise.

## 2014-06-10 NOTE — Patient Instructions (Signed)
  Please complete the assigned speech therapy homework at least 15-20 minutes at least 4 times per day.

## 2014-06-10 NOTE — Telephone Encounter (Signed)
Form received on 06/09/14, and was placed on Dr Warren Danes desk 06/10/14

## 2014-06-10 NOTE — Telephone Encounter (Signed)
Rebecca Russo did you call patient about her form if so, please see note below

## 2014-06-11 ENCOUNTER — Telehealth: Payer: Self-pay | Admitting: Cardiovascular Disease

## 2014-06-11 ENCOUNTER — Other Ambulatory Visit: Payer: Self-pay | Admitting: Neurology

## 2014-06-11 DIAGNOSIS — Z0289 Encounter for other administrative examinations: Secondary | ICD-10-CM

## 2014-06-11 DIAGNOSIS — I639 Cerebral infarction, unspecified: Secondary | ICD-10-CM

## 2014-06-11 NOTE — Telephone Encounter (Signed)
Form [employer] received,completed by Dr [name] and Andreas Blower faxed, patient copy at front desk 06-11-14.

## 2014-06-11 NOTE — Telephone Encounter (Signed)
Time hospital records dropped off By mom placed in chart prep bin.

## 2014-06-11 NOTE — Telephone Encounter (Signed)
Called patient need a signed release for Monia Pouch, will come in and sign 06/11/14.

## 2014-06-15 ENCOUNTER — Encounter: Payer: Self-pay | Admitting: *Deleted

## 2014-06-15 ENCOUNTER — Telehealth: Payer: Self-pay | Admitting: Cardiovascular Disease

## 2014-06-15 NOTE — Telephone Encounter (Signed)
New Msg        Pt mother states she is returning a call from today.   Please contact pt mother Coralee North at [phone].

## 2014-06-15 NOTE — Telephone Encounter (Signed)
I spoke with the pt's mother and made her aware that I had not called them today.

## 2014-06-16 ENCOUNTER — Telehealth: Payer: Self-pay | Admitting: Neurology

## 2014-06-16 DIAGNOSIS — I639 Cerebral infarction, unspecified: Secondary | ICD-10-CM

## 2014-06-16 NOTE — Telephone Encounter (Signed)
Pt's mother is calling stating patient thinks she needs to have rehab for her right arm, she states that it is very weak.  Please call and advise.

## 2014-06-17 ENCOUNTER — Ambulatory Visit: Payer: Managed Care, Other (non HMO)

## 2014-06-17 NOTE — Addendum Note (Signed)
Addended by: Marvel Plan on: 06/17/2014 01:24 PM   Modules accepted: Orders

## 2014-06-17 NOTE — Telephone Encounter (Signed)
She should have PT arranged already as I remembered that I recently signed an PT order for her requested by a therapist from the neurorehab facility.   I tried to call her today with her number in chart but nobody available to pick up the phone. Please let her know that I will put in a new order for PT again but if she did not hear anything from PT, she should call back in one week. She has 06/22/14 to see Dr. Excell Seltzer and please let her keep the appointment. Thanks.  Marvel Plan, MD PhD Stroke Neurology 06/17/2014 1:21 PM

## 2014-06-17 NOTE — Telephone Encounter (Signed)
Patient states that she would like to have physical therapy for arm, due to weakness, please advise

## 2014-06-18 NOTE — Progress Notes (Signed)
Recannulization time.

## 2014-06-18 NOTE — Telephone Encounter (Signed)
Patient's mother is calling regarding forms that were to be faxed to [facility].  Monia Pouch states forms were not received. Please call patient's mother and advise. Thank you.

## 2014-06-18 NOTE — Progress Notes (Signed)
Procedure end time

## 2014-06-18 NOTE — Progress Notes (Signed)
First groin puncture

## 2014-06-19 ENCOUNTER — Ambulatory Visit: Payer: Managed Care, Other (non HMO) | Attending: Neurology

## 2014-06-19 DIAGNOSIS — I69391 Dysphagia following cerebral infarction: Secondary | ICD-10-CM | POA: Diagnosis not present

## 2014-06-19 DIAGNOSIS — I69390 Apraxia following cerebral infarction: Secondary | ICD-10-CM | POA: Insufficient documentation

## 2014-06-19 DIAGNOSIS — R482 Apraxia: Secondary | ICD-10-CM

## 2014-06-19 DIAGNOSIS — I69320 Aphasia following cerebral infarction: Secondary | ICD-10-CM | POA: Insufficient documentation

## 2014-06-19 NOTE — Therapy (Signed)
[facility] [address] [place], [place], [address] Phone: [phone]   Fax:  [phone]  Speech Language Pathology Treatment  Patient Details  Name: Rebecca Russo MRN: [id number] Date of Birth: 02-12-1976 Referring Provider:  Claude Manges, FNP  Encounter Date: 06/19/2014      End of Session - 06/19/14 1253    Visit Number 3   Number of Visits 24   Date for SLP Re-Evaluation 08/04/14   SLP Start Time 1147   SLP Stop Time  1230   SLP Time Calculation (min) 43 min   Activity Tolerance Patient tolerated treatment well      Past Medical History  Diagnosis Date  . H/O varicella   . Increased BMI 12/17/08    Past Surgical History  Procedure Laterality Date  . Laparoscopic cholecystectomy  06/20/2012  . Radiology with anesthesia N/A 05/29/2014    Procedure: RADIOLOGY WITH ANESTHESIA;  Surgeon: Oneal Grout, MD;  [facility] OR;  Service: Radiology;  Laterality: N/A;  . 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 Divergent naming: average 3 items before mod cues needed, opposites (simple) with 90% success, less common opposites (dull/sharp, rough/smooth, e.g.) 75% success, improved to 85% with mod cues.   Assessment / Recommendations / Plan   Plan Continue  with current plan of care   Progression Toward Goals   Progression toward goals Progressing toward goals          SLP Education - 06/19/14 1253    Education provided Yes   Education Details home tasks, cueing strategies (for family)   Person(s) Educated Patient;Caregiver(s)   Methods Explanation;Demonstration;Verbal cues   Comprehension Verbalized understanding;Returned demonstration;Verbal cues required          SLP Short Term Goals - 06/19/14 1256    SLP SHORT TERM GOAL #1   Title Pt will perform simple convergent and divergent naming tasks with 85% accuracy and occassional minimal assistance (due 07/10/14)   Time 3   Period Weeks   Status New   SLP SHORT TERM GOAL #2   Title Pt will demonstrate auditory comprehension of simple conversation and questions with 85% accuracy and occassional minimal assistance (07/10/14)   Time 3   Period Weeks   Status On-going   SLP SHORT TERM GOAL #3   Title Pt will id object to phrase/sentence description with 85% accuracy and rare minimal assistance (07/10/14)   Time 3   Period Weeks   Status On-going   SLP SHORT TERM GOAL #4   Title ID object/picture to written description f:4 with 80% acccuracy and rare minimal assistance (07/10/14)   Time 3   Period Weeks   Status On-going          SLP Long Term Goals - 06/19/14 1256    SLP LONG TERM GOAL #1   Title Demonsrate  compensations for aphasia during simple conversation 80% of opportunities and occassional minimal assistance (08/04/14)   Time 7   Period Weeks   Status On-going   SLP LONG TERM GOAL #2   Title Participate in simple covnersation verbally with 80% accuracy and occassional minimal assistance (08/04/14)   Time 7   Period Weeks   Status On-going   SLP LONG TERM GOAL #3   Title Comprehend simple 3-4 sentence paragraph with 80% accuracy and occassional min A. (08/04/14)   Time 7   Period Weeks   Status On-going   SLP LONG TERM GOAL #4   Title Perform simple functional  written expression tasks with 80% accuracy and occassional minimal A (08/04/14)   Time 7   Period Weeks   Status On-going          Plan - 06/19/14 1254    Clinical Impression Statement Pt cont to present with receptive languaage < expressive language. Family in today to ask how to assist pt at home. SLP spent approx 10 minutes total educating family for home tasks and in cueing pt during home tasks.   Speech Therapy Frequency 3x / week   Duration --  7 weeks   Treatment/Interventions Language facilitation;Cueing hierarchy;Functional tasks;Patient/family education;Compensatory strategies;SLP instruction and feedback   Potential to Achieve Goals Good   Potential Considerations Severity of impairments        Problem List Patient Active Problem List   Diagnosis Date Noted  . Morbid obesity 06/04/2014  . Dysphagia, pharyngoesophageal phase 06/04/2014  . PFO with atrial septal aneurysm 06/04/2014  . Hyperlipidemia LDL goal <70 06/04/2014  . Ischemic brain damage   . Acute respiratory failure with hypoxia   . CVA (cerebral vascular accident) 05/29/2014  . Aphasia complicating stroke 05/29/2014  . Hemiplegia affecting dominant side 05/29/2014  . CONSTIPATION 03/09/2008  . IRRITABLE BOWEL SYNDROME 03/09/2008    Verdie Mosher, SLP 06/19/2014, 12:57 PM  [facility] [address] [place], [place], [address] Phone: [phone]   Fax:  [phone]

## 2014-06-22 ENCOUNTER — Ambulatory Visit (INDEPENDENT_AMBULATORY_CARE_PROVIDER_SITE_OTHER): Payer: Managed Care, Other (non HMO) | Admitting: Cardiovascular Disease

## 2014-06-22 ENCOUNTER — Ambulatory Visit: Payer: Managed Care, Other (non HMO) | Admitting: Physical Therapy

## 2014-06-22 ENCOUNTER — Encounter: Payer: Self-pay | Admitting: Cardiovascular Disease

## 2014-06-22 ENCOUNTER — Ambulatory Visit: Payer: Managed Care, Other (non HMO)

## 2014-06-22 VITALS — BP 126/74 | HR 83 | Ht 70.0 in | Wt 339.0 lb

## 2014-06-22 DIAGNOSIS — I69359 Hemiplegia and hemiparesis following cerebral infarction affecting unspecified side: Secondary | ICD-10-CM

## 2014-06-22 DIAGNOSIS — I639 Cerebral infarction, unspecified: Secondary | ICD-10-CM

## 2014-06-22 DIAGNOSIS — Q211 Atrial septal defect: Secondary | ICD-10-CM

## 2014-06-22 DIAGNOSIS — Q2112 Patent foramen ovale: Secondary | ICD-10-CM

## 2014-06-22 DIAGNOSIS — R482 Apraxia: Secondary | ICD-10-CM

## 2014-06-22 DIAGNOSIS — I69320 Aphasia following cerebral infarction: Secondary | ICD-10-CM | POA: Diagnosis not present

## 2014-06-22 MED ORDER — CLOPIDOGREL BISULFATE 75 MG PO TABS: 75.0000 mg | ORAL_TABLET | Freq: Every day | ORAL | Status: DC

## 2014-06-22 NOTE — Therapy (Signed)
[facility] [address] [place], [place], [address] Phone: [phone]   Fax:  [phone]  Speech Language Pathology Treatment  Patient Details  Name: Rebecca Russo MRN: [id number] Date of Birth: 23-Mar-1976 Referring Provider:  Claude Manges, FNP  Encounter Date: 06/22/2014      End of Session - 06/22/14 0855    Visit Number 4   Number of Visits 24   Date for SLP Re-Evaluation 08/04/14   SLP Start Time 0806   SLP Stop Time  0849   SLP Time Calculation (min) 43 min   Activity Tolerance Patient tolerated treatment well      Past Medical History  Diagnosis Date  . H/O varicella   . Increased BMI 12/17/08    Past Surgical History  Procedure Laterality Date  . Laparoscopic cholecystectomy  06/20/2012  . Radiology with anesthesia N/A 05/29/2014    Procedure: RADIOLOGY WITH ANESTHESIA;  Surgeon: Oneal Grout, MD;  Location: [facility] OR;  Service: Radiology;  Laterality: N/A;  . 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 No/denies             ADULT SLP TREATMENT - 06/22/14 0837    General Information   Behavior/Cognition Alert;Cooperative;Pleasant mood   Treatment Provided   Treatment provided Cognitive-Linquistic   Pain Assessment   Pain Assessment No/denies pain   Cognitive-Linquistic Treatment   Treatment focused on Apraxia;Aphasia   Skilled Treatment Confrontation naming 88%, automatics 100%. Similarities/differences 25%; incr'd to 85% with usual mod-max cues. Simple conversation 80% with occasional min A.    Assessment / Recommendations /  Plan   Plan Continue with current plan of care   Progression Toward Goals   Progression toward goals Progressing toward goals            SLP Short Term Goals - 06/22/14 0856    SLP SHORT TERM GOAL #1   Title Pt will perform simple convergent and divergent naming tasks with 85% accuracy and occassional minimal assistance (due 07/10/14)   Time 2   Period Weeks   Status On-going   SLP SHORT TERM GOAL #2   Title Pt will demonstrate auditory comprehension of simple conversation and questions with 85% accuracy and occassional minimal assistance (07/10/14)   Time 2   Period Weeks   Status On-going   SLP SHORT TERM GOAL #3   Title Pt will id object to phrase/sentence description with 85% accuracy and rare minimal assistance (07/10/14)   Time 2   Period Weeks   Status On-going   SLP SHORT TERM GOAL #4   Title ID object/picture to written description f:4 with 80% acccuracy and rare minimal assistance (07/10/14)   Time 2   Period Weeks   Status On-going          SLP Long Term Goals - 06/22/14 0857    SLP LONG TERM GOAL #1   Title Demonsrate compensations for aphasia during simple conversation 80% of opportunities and occassional minimal assistance (08/04/14)   Time 6   Period Weeks   Status On-going   SLP LONG TERM GOAL #2  Title Participate in simple covnersation verbally with 80% accuracy and occassional minimal assistance (08/04/14)   Time 6   Period Weeks   Status On-going   SLP LONG TERM GOAL #3   Title Comprehend simple 3-4 sentence paragraph with 80% accuracy and occassional min A. (08/04/14)   Time 6   Period Weeks   Status On-going   SLP LONG TERM GOAL #4   Title Perform simple functional written expression tasks with 80% accuracy and occassional minimal A (08/04/14)   Time 6   Period Weeks   Status On-going          Plan - 06/22/14 1610    Clinical Impression Statement Pt's conversational speech is better than last week.   Speech Therapy Frequency 3x / week    Duration --  6 weeks   Treatment/Interventions Language facilitation;Cueing hierarchy;Functional tasks;Patient/family education;Compensatory strategies;SLP instruction and feedback   Potential to Achieve Goals Good        Problem List Patient Active Problem List   Diagnosis Date Noted  . Morbid obesity 06/04/2014  . Dysphagia, pharyngoesophageal phase 06/04/2014  . PFO with atrial septal aneurysm 06/04/2014  . Hyperlipidemia LDL goal <70 06/04/2014  . Ischemic brain damage   . Acute respiratory failure with hypoxia   . CVA (cerebral vascular accident) 05/29/2014  . Aphasia complicating stroke 05/29/2014  . Hemiplegia affecting dominant side 05/29/2014  . CONSTIPATION 03/09/2008  . IRRITABLE BOWEL SYNDROME 03/09/2008    Verdie Mosher, SLP 06/22/2014, 8:58 AM  [facility] [address] [place], [place], [address] Phone: [phone]   Fax:  [phone]

## 2014-06-22 NOTE — Telephone Encounter (Signed)
Called patient and left Vm that form was faxed on 05/16/14 by medical records and a copy was left at the front desk. Call back with any concerns.

## 2014-06-22 NOTE — Patient Instructions (Signed)
Your physician has recommended PFO closure.  Your physician has recommended you make the following change in your medication:  1. START Plavix 75mg  take one by mouth daily

## 2014-06-22 NOTE — Patient Instructions (Signed)
Continue with speech-language tasks, at least 4 times a day of 15-20 minutes each time. Do more if you can.

## 2014-06-22 NOTE — Progress Notes (Signed)
Cardiology Office Note   Date:  06/23/2014   ID:  Rebecca Russo, Rebecca Russo 07-18-1975, MRN [id number]  PCP:  Claude Manges, FNP  Cardiologist:  Tonny Bollman, MD    Chief Complaint  Patient presents with  . here to discuss PFO Closure     History of Present Illness: Rebecca Russo is a 39 y.o. female who presents for cardiac evaluation. The patient was in her normal state of good health when she developed sudden onset of right arm and leg paralysis on January 14. The patient was found to have a large left MCA infarct and she underwent mechanical thrombectomy. During the course of her evaluation, there was no clear etiology of her stroke except for the presence of a large PFO and associated atrial septal aneurysm. Other extensive evaluation showed no evidence of a hypercoagulable disorder or any vascular disease. She did not have fever.  The patient presents today to discuss consideration of transcatheter closure of her large PFO. She has residual expressive aphasia. She is here today with her mother and her brother.  The patient has no chest pain, shortness of breath, or leg swelling. She denies any bleeding problems. She has no other complaints today.   Past Medical History  Diagnosis Date  . H/O varicella   . Increased BMI 12/17/08    Past Surgical History  Procedure Laterality Date  . Laparoscopic cholecystectomy  06/20/2012  . Radiology with anesthesia N/A 05/29/2014    Procedure: RADIOLOGY WITH ANESTHESIA;  Surgeon: Oneal Grout, MD;  Location: [facility] OR;  Service: Radiology;  Laterality: N/A;  . Tee without cardioversion N/A 06/03/2014    Procedure: TRANSESOPHAGEAL ECHOCARDIOGRAM (TEE);  Surgeon: Wendall Stade, MD;  Location: [facility] ENDOSCOPY;  Service: Cardiovascular;  Laterality: N/A;    Current Outpatient Prescriptions  Medication Sig Dispense Refill  . aspirin 325 MG tablet Take 1 tablet (325 mg total) by mouth daily. 90 tablet 3  . rosuvastatin (CRESTOR) 5 MG tablet Take 1  tablet (5 mg total) by mouth daily at 6 PM. 30 tablet 5  . clopidogrel (PLAVIX) 75 MG tablet Take 1 tablet (75 mg total) by mouth daily. 30 tablet 11   No current facility-administered medications for this visit.    Allergies:   Review of patient's allergies indicates no known allergies.   Social History:  The patient  reports that she has never smoked. She does not have any smokeless tobacco history on file. She reports that she does not drink alcohol or use illicit drugs.   Family History:  The patient's  family history includes Hypertension in her father and mother.    ROS:  Please see the history of present illness. The only positive symptom reported his presence of long-standing migraine headaches. All other systems are reviewed and negative.    PHYSICAL EXAM: VS:  BP 126/74 mmHg  Pulse 83  Ht  (1.778 m)  Wt 339 lb (153.769 kg)  BMI 48.64 kg/m2  LMP  (LMP Unknown) , BMI Body mass index is 48.64 kg/(m^2). GEN: Well nourished, well developed, pleasant obese woman in no acute distress HEENT: normal Neck: no JVD, carotid bruits, or masses Cardiac: RRR without murmur or gallop                No peripheral edema Respiratory:  clear to auscultation bilaterally, normal work of breathing GI: soft, nontender, nondistended, + BS MS: no deformity or atrophy Skin: warm and dry, no rash Neuro:  Strength and sensation are intact,  expressive aphasia is present Psych: euthymic mood, full affect  EKG:  EKG is not ordered today.  Recent Labs: 05/29/2014: ALT 22 05/31/2014: Magnesium 2.0 06/03/2014: BUN 13; Creatinine 0.79; Hemoglobin 11.5*; Platelets 431*; Potassium 3.8; Sodium 140   Lipid Panel     Component Value Date/Time   CHOL 173 05/30/2014 1200   TRIG 225* 05/30/2014 1200   HDL 31* 05/30/2014 1200   CHOLHDL 5.6 05/30/2014 1200   VLDL 45* 05/30/2014 1200   LDLCALC 97 05/30/2014 1200      Wt Readings from Last 3 Encounters:  06/22/14 339 lb (153.769 kg)  05/31/14 346  lb (156.945 kg)  06/17/12 345 lb (156.491 kg)     Cardiac Studies Reviewed: 2D Echo 06-01-2014: Study Conclusions  - Left ventricle: The cavity size was normal. Wall thickness was normal. Systolic function was normal. The estimated ejection fraction was in the range of 55% to 60%. Wall motion was normal; there were no regional wall motion abnormalities. Doppler parameters are consistent with abnormal left ventricular relaxation (grade 1 diastolic dysfunction).  Impressions:  - No cardiac source of emboli was indentified.  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 This patient underwent extensive stroke workup and there was no other potential causative mechanism identified. I have personally reviewed the patients TEE and echo images. She has a very large PFO with spontaneous right to left flow and an associated atrial septal aneurysm. Patients with this type of PFO anatomy have the highest risk of recurrent stroke. I have reviewed clinical trial data of PFO closure with the patient and her family members today. We have discussed the fact that there is some degree of equipoise between device closure and medical therapy for secondary stroke prevention. However, there is a clear trend towards benefit in younger patients and those with atrial septal aneurysm. After review of options, the patient would like to proceed with transcatheter closure. I think this is appropriate.  I have reviewed the specific risks, indications, and alternatives to transcatheter PFO closure with the patient. Specific risks include bleeding, infection, stroke, myocardial infarction, cardiac perforation, device embolization, late device erosion,  emergency cardiac surgery, and death. She understands these risks are low and would like to proceed. All questions were answered. The patient will be started on clopidogrel 75 mg daily and we will schedule the procedure at the next available time.   Current medicines are reviewed with the patient today.  The patient does not have concerns regarding medicines.  The following changes have been made:   Start plavix 75 mg daily. Continue aspirin.  Labs/ tests ordered today include:   Orders Placed This Encounter  Procedures  . Basic Metabolic Panel (BMET)  . CBC  . INR/PT   Signed, Tonny Bollman, MD  06/23/2014 2:45 PM    [facility] [address] [place], [place], [place]  [address] Phone: [phone]; Fax: [phone]

## 2014-06-23 ENCOUNTER — Encounter: Payer: Self-pay | Admitting: Cardiovascular Disease

## 2014-06-23 ENCOUNTER — Telehealth: Payer: Self-pay | Admitting: Neurology

## 2014-06-23 NOTE — Telephone Encounter (Signed)
Patient's mother, Coralee North @ [phone], stated Rebecca Russo never received STD form faxed on 05/16/14.  Mother stated Rebecca Russo has re faxed a STD form today and need completed asap.  Please call mother when completed and she will pick up and fax to [facility] herself.  Please call and advise.

## 2014-06-24 ENCOUNTER — Ambulatory Visit: Payer: Managed Care, Other (non HMO)

## 2014-06-24 DIAGNOSIS — R482 Apraxia: Secondary | ICD-10-CM

## 2014-06-24 DIAGNOSIS — I69320 Aphasia following cerebral infarction: Secondary | ICD-10-CM

## 2014-06-24 NOTE — Therapy (Signed)
[facility] [address] [place], [place], [address] Phone: [phone]   Fax:  [phone]  Speech Language Pathology Treatment  Patient Details  Name: Rebecca Russo MRN: [id number] Date of Birth: 12/12/1975 Referring Provider:  Claude Manges, FNP  Encounter Date: 06/24/2014      End of Session - 06/24/14 1628    Visit Number 5   Number of Visits 24   Date for SLP Re-Evaluation 08/04/14   SLP Start Time 1318   SLP Stop Time  1400   SLP Time Calculation (min) 42 min   Activity Tolerance Patient tolerated treatment well      Past Medical History  Diagnosis Date  . H/O varicella   . Increased BMI 12/17/08    Past Surgical History  Procedure Laterality Date  . Laparoscopic cholecystectomy  06/20/2012  . Radiology with anesthesia N/A 05/29/2014    Procedure: RADIOLOGY WITH ANESTHESIA;  Surgeon: Oneal Grout, MD;  Location: [facility] OR;  Service: Radiology;  Laterality: N/A;  . 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 Simple sentences ("A/The ___ is ___ (a/the ____)") initially with usual mod-max cues faded to occasional mod A. Conversation- simple- with SLP questions needed usually for clarification of pt message. This level conversation occasionally  non-functional at this time, but improved from last week. "3 clues" activity completed with 25% success and mod cues needed usually for added success.   Assessment / Recommendations / Plan   Plan Continue with current plan of care   Progression Toward Goals   Progression toward goals Progressing toward goals            SLP Short Term Goals - 06/24/14 1631    SLP SHORT TERM GOAL #1   Title Pt will perform simple convergent and divergent naming tasks with 85% accuracy and occassional minimal assistance (due 07/10/14)   Time 2   Period Weeks   Status On-going   SLP SHORT TERM GOAL #2   Title Pt will demonstrate auditory comprehension of simple conversation and questions with 85% accuracy and occassional minimal assistance (07/10/14)   Time 2   Period Weeks   Status On-going   SLP SHORT TERM GOAL #3   Title Pt will id object to phrase/sentence description with 85% accuracy and rare minimal assistance (07/10/14)   Time 2   Period Weeks   Status On-going   SLP SHORT TERM GOAL #4   Title ID object/picture to written description f:4 with 80% acccuracy and rare minimal assistance (07/10/14)   Time 2   Period Weeks   Status On-going          SLP Long Term Goals - 06/22/14 0857    SLP LONG TERM GOAL #1   Title Demonsrate compensations for aphasia during simple conversation 80% of opportunities and occassional minimal assistance (08/04/14)  Time 6   Period Weeks   Status On-going   SLP LONG TERM GOAL #2   Title Participate in simple covnersation verbally with 80% accuracy and occassional minimal assistance (08/04/14)   Time 6   Period Weeks   Status On-going   SLP LONG TERM GOAL #3   Title Comprehend simple 3-4 sentence paragraph with 80% accuracy and occassional min A. (08/04/14)   Time 6   Period Weeks   Status On-going   SLP LONG TERM GOAL #4   Title Perform simple functional written expression tasks with 80% accuracy and occassional minimal A (08/04/14)   Time 6   Period  Weeks   Status On-going          Plan - 06/24/14 1630    Speech Therapy Frequency 3x / week   Duration --  6 weeks   Treatment/Interventions Language facilitation;Cueing hierarchy;Functional tasks;Patient/family education;Compensatory strategies;SLP instruction and feedback   Potential to Achieve Goals Good   Potential Considerations Severity of impairments        Problem List Patient Active Problem List   Diagnosis Date Noted  . Morbid obesity 06/04/2014  . Dysphagia, pharyngoesophageal phase 06/04/2014  . PFO with atrial septal aneurysm 06/04/2014  . Hyperlipidemia LDL goal <70 06/04/2014  . Ischemic brain damage   . Acute respiratory failure with hypoxia   . CVA (cerebral vascular accident) 05/29/2014  . Aphasia complicating stroke 05/29/2014  . Hemiplegia affecting dominant side 05/29/2014  . CONSTIPATION 03/09/2008  . IRRITABLE BOWEL SYNDROME 03/09/2008    Verdie Mosher, SLP 06/24/2014, 4:33 PM  [facility] [address] [place], [place], [address] Phone: [phone]   Fax:  [phone]

## 2014-06-24 NOTE — Telephone Encounter (Signed)
Spoke with patient's mother and informed her that FMLA form was faxed on 1/28 and again on 2/10, instructed her that if [employer] Pouch is still saying that they did not receive form then she should send her copy to them. Coralee North also states that an additional form was faxed over for Short Term Disability, instructed mother that this form is a different form and that they will have to pay the $25.00 for this form. Instructed mother that once form is received we will let her know once the form arrives.

## 2014-06-24 NOTE — Therapy (Signed)
[facility] [address] [place], [place], [address] Phone: [phone]   Fax:  [phone]  Physical Therapy Evaluation  Patient Details  Name: Rebecca Russo MRN: [id number] Date of Birth: 10-28-1975 Referring Provider:  Claude Manges, FNP  Encounter Date: 06/22/2014      PT End of Session - 06/24/14 0805    Visit Number 1   Number of Visits 1  No further PT visits warranted at this time   PT Start Time 0848   PT Stop Time 0930   PT Time Calculation (min) 42 min   Activity Tolerance Patient tolerated treatment well      Past Medical History  Diagnosis Date  . H/O varicella   . Increased BMI 12/17/08    Past Surgical History  Procedure Laterality Date  . Laparoscopic cholecystectomy  06/20/2012  . Radiology with anesthesia N/A 05/29/2014    Procedure: RADIOLOGY WITH ANESTHESIA;  Surgeon: Oneal Grout, MD;  [facility] OR;  Service: Radiology;  Laterality: N/A;  . Tee without cardioversion N/A 06/03/2014    Procedure: TRANSESOPHAGEAL ECHOCARDIOGRAM (TEE);  Surgeon: Wendall Stade, MD;  Location: [facility] ENDOSCOPY;  Service: Cardiovascular;  Laterality: N/A;    LMP  (LMP Unknown)  Visit Diagnosis:  Hemiparesis due to recent cerebral infarction        [facility] PT Assessment - 06/24/14 0803    Assessment   Medical Diagnosis CVA with R sided weakness   Onset Date 05/30/14   Precautions   Precautions Fall   Home Environment   Living Enviornment Private residence   Living Arrangements Children   Available Help at Discharge Family   Type of Home House   Home Access Stairs to enter   Entrance Stairs-Number of Steps 6   Entrance Stairs-Rails Can reach both   Home Layout One level   Home Equipment None   Prior Function   Level of Independence Independent with gait;Independent with transfers;Independent with basic ADLs;Independent with homemaking with ambulation   Vocation Full time employment   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   Leisure Worked out at gym 2-3 times/week   Strength   Right Hip Flexion 5/5   Right Hip ABduction 4+/5   Left Hip Flexion 5/5   Left Hip Extension 5/5   Right Knee Flexion 4/5   Right Knee Extension 5/5   Left Knee Flexion 5/5   Left Knee Extension 5/5   Right Ankle Dorsiflexion 5/5   Left Ankle Dorsiflexion 5/5   Left Ankle Plantar Flexion 5/5   Ambulation/Gait   Ambulation/Gait Yes   Ambulation/Gait Assistance 7: Independent   Ambulation Distance (Feet) 200 Feet   Assistive device None   Gait Pattern Within Functional Limits   Ambulation Surface Indoor;Level   Gait velocity 9.87 sec=3.32 ft/sec   Stairs Yes   Stairs Assistance 7: Independent   Stair Management Technique No rails;One rail Left   Number of Stairs 4   Standardized Balance Assessment   Standardized Balance Assessment Timed Up and Go Test   Timed Up and Go Test   TUG Normal TUG   Normal TUG (seconds) 9.38   Manual TUG (seconds) 8.61   High Level Balance   High Level Balance Comments Singel limb stance each leg 10 seconds; tandem stance each position 30 seconds   Functional Gait  Assessment   Gait assessed  Yes   Gait Level Surface Walks 20 ft in less than 7 sec but greater than 5.5 sec,  uses assistive device, slower speed, mild gait deviations, or deviates 6-10 in outside of the 12 in walkway width.  5.88 sec   Change in Gait Speed Able to smoothly change walking speed without loss of balance or gait deviation. Deviate no more than 6 in outside of the 12 in walkway width.   Gait with Horizontal Head Turns Performs head turns smoothly with no change in gait. Deviates no more than 6 in outside 12 in walkway width   Gait with Vertical Head Turns Performs head turns with no change in gait. Deviates no more than 6 in outside 12 in walkway width.   Gait and Pivot Turn Pivot turns safely within 3 sec and stops quickly with no loss of balance.   Step Over  Obstacle Is able to step over one shoe box (4.5 in total height) without changing gait speed. No evidence of imbalance.   Gait with Narrow Base of Support Ambulates less than 4 steps heel to toe or cannot perform without assistance.   Gait with Eyes Closed Walks 20 ft, uses assistive device, slower speed, mild gait deviations, deviates 6-10 in outside 12 in walkway width. Ambulates 20 ft in less than 9 sec but greater than 7 sec.   Ambulating Backwards Walks 20 ft, no assistive devices, good speed, no evidence for imbalance, normal gait   Steps Alternating feet, must use rail.   Total Score 23                            PT Short Term Goals - 06/24/14 0814    PT SHORT TERM GOAL #1   Title NA           PT Long Term Goals - 06/24/14 0814    PT LONG TERM GOAL #1   Title NA               Plan - 06/24/14 0809    Clinical Impression Statement Pt is a 39 year old female who presents to OP PT status post CVA.  Pt  presents with R UE weakness, with R UE difficulty her main concern, as she worked as a Clinical biochemist Rep prior to CVA.  Pt feels that her balance and walking were not affected.  She demonstrates slight R weakness of ankle musculature, but does not note functional changes.  Pt scores 23/30 on Functional Gait Assessment.  With gait and balance assessment scores, pt does not appear to be at fall risk.  Discussed possibility of seeing patient for several visits to initiate HEP for strength/high level balance, but pt feels she is at baseline for gait and balance.  No further PT needs warranted at this time.   Rehab Potential --  Pt is EVAL only   PT Next Visit Plan Eval only, as pt appears to be [facility] for gait and balance, does not appear to be at fall risk.   Recommended Other Services Occupational therapy   Consulted and Agree with Plan of Care Patient;Family member/caregiver   Family Member Consulted Mother         Problem List Patient Active Problem  List   Diagnosis Date Noted  . Morbid obesity 06/04/2014  . Dysphagia, pharyngoesophageal phase 06/04/2014  . PFO with atrial septal aneurysm 06/04/2014  . Hyperlipidemia LDL goal <70 06/04/2014  . Ischemic brain damage   . Acute respiratory failure with hypoxia   . CVA (cerebral vascular accident) 05/29/2014  . Aphasia complicating  stroke 05/29/2014  . Hemiplegia affecting dominant side 05/29/2014  . CONSTIPATION 03/09/2008  . IRRITABLE BOWEL SYNDROME 03/09/2008    Kiyon Fidalgo W. 06/24/2014, 8:20 AM   Lonia Blood, PT 06/24/2014 8:20 AM Phone: [phone] Fax: [phone] FOR EVALUATION COMPLETED 06/22/2014  [facility] [address] [place], [place], [address] Phone: [phone]   Fax:  [phone]

## 2014-06-25 ENCOUNTER — Telehealth: Payer: Self-pay | Admitting: *Deleted

## 2014-06-25 ENCOUNTER — Telehealth: Payer: Self-pay | Admitting: Neurology

## 2014-06-25 NOTE — Telephone Encounter (Signed)
Called patient's mother back and informed her that we did receive the Attending Provider Statement, but she needed to come in and pay for form before it can be worked on, also informed her that Dr [name] will not be in the office tomorrow and she will need to come today so that I can get his signature while he is here, mother understood and stated that she will be in today.

## 2014-06-25 NOTE — Telephone Encounter (Signed)
Form,[employer] sent to [facility] and Dr [name] to be completed 06-25-14 Received,completed faxed and copy at front desk for patient.

## 2014-06-25 NOTE — Telephone Encounter (Signed)
Patient mother is requesting to know if patient is allowed to exercise on elliptical machine, please advise.

## 2014-06-25 NOTE — Telephone Encounter (Signed)
Patient's mother came in today and paid for form, form was completed by Dr [name], copies were given to patient's mother and states that she will fax to company.

## 2014-06-25 NOTE — Telephone Encounter (Signed)
Patient's mother is calling to see if patient can exercise on an elliptical. Please call and advise. Thank you.

## 2014-06-25 NOTE — Telephone Encounter (Signed)
Mother checking status for additional form [employer].  Please call and advise.

## 2014-06-25 NOTE — Telephone Encounter (Signed)
Called mom over the phone and cleared pt to work on elliptical machine. She is OK to do exercise like that. I will see her on 07/07/14 and she is going to have endovascular procedure with Dr. Excell Seltzer on 07/09/2014.  Marvel Plan, MD PhD Stroke Neurology 06/25/2014 4:53 PM

## 2014-06-26 ENCOUNTER — Encounter: Payer: Self-pay | Admitting: Occupational Therapy

## 2014-06-26 ENCOUNTER — Ambulatory Visit: Payer: Managed Care, Other (non HMO) | Admitting: Occupational Therapy

## 2014-06-26 ENCOUNTER — Ambulatory Visit: Payer: Managed Care, Other (non HMO)

## 2014-06-26 DIAGNOSIS — I69359 Hemiplegia and hemiparesis following cerebral infarction affecting unspecified side: Secondary | ICD-10-CM

## 2014-06-26 DIAGNOSIS — R482 Apraxia: Secondary | ICD-10-CM

## 2014-06-26 DIAGNOSIS — R4189 Other symptoms and signs involving cognitive functions and awareness: Secondary | ICD-10-CM

## 2014-06-26 DIAGNOSIS — R29898 Other symptoms and signs involving the musculoskeletal system: Secondary | ICD-10-CM

## 2014-06-26 DIAGNOSIS — Z0289 Encounter for other administrative examinations: Secondary | ICD-10-CM

## 2014-06-26 DIAGNOSIS — I69320 Aphasia following cerebral infarction: Secondary | ICD-10-CM | POA: Diagnosis not present

## 2014-06-26 DIAGNOSIS — IMO0002 Reserved for concepts with insufficient information to code with codable children: Secondary | ICD-10-CM

## 2014-06-26 DIAGNOSIS — R449 Unspecified symptoms and signs involving general sensations and perceptions: Secondary | ICD-10-CM

## 2014-06-26 NOTE — Therapy (Signed)
[facility] [address] [place], [place], [address] Phone: [phone]   Fax:  [phone]  Occupational Therapy Evaluation  Patient Details  Name: Rebecca Russo MRN: [id number] Date of Birth: Mar 05, 1976 Referring Provider:  Claude Manges, FNP  Encounter Date: 06/26/2014      OT End of Session - 06/26/14 1741    Number of Visits 16      Past Medical History  Diagnosis Date  . H/O varicella   . Increased BMI 12/17/08    Past Surgical History  Procedure Laterality Date  . Laparoscopic cholecystectomy  06/20/2012  . Radiology with anesthesia N/A 05/29/2014    Procedure: RADIOLOGY WITH ANESTHESIA;  Surgeon: Oneal Grout, MD;  Location: [facility] OR;  Service: Radiology;  Laterality: N/A;  . Tee without cardioversion N/A 06/03/2014    Procedure: TRANSESOPHAGEAL ECHOCARDIOGRAM (TEE);  Surgeon: Wendall Stade, MD;  Location: [facility] ENDOSCOPY;  Service: Cardiovascular;  Laterality: N/A;    LMP  (LMP Unknown)  Visit Diagnosis:  Hemiparesis affecting dominant side as late effect of cerebrovascular accident - Plan: Ot plan of care cert/re-cert  Sensory deficit, right - Plan: Ot plan of care cert/re-cert  Decreased grip strength of right hand - Plan: Ot plan of care cert/re-cert  Lack of coordination due to stroke - Plan: Ot plan of care cert/re-cert      Subjective Assessment - 06/26/14 1700    Pertinent History see snapshot, CVA, PFO   Limitations Aphasia, expressive>receptive   Patient Stated Goals Use my right hand!   Currently in Pain? 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 22   ADL   Eating/Feeding Set up   Eating/Feeding Patient Percentage --  frequently drops utensils   Grooming Applying makeup   Grooming Patient Percentage 0%   Upper Body Bathing Independent   Lower Body Bathing Independent   Upper Body Dressing Independent   Lower Body Dressing Independent   Public house manager Independent   ADL comments Patient unable to apply makeup, don/doff jewelery as she used to do daily for work   IADL   Meal Prep Needs to have meals prepared and served   Prior Level of Neurosurgeon on family or friends for transportation   Prior Level of Function Financial Management independent   Financial Management Requires assistance   Mobility   Mobility Status Independent   Written Expression   Dominant Hand Right   Handwriting 50% legible   Vision - History   Baseline Vision No visual deficits   Patient Visual Report Other (comment)  Reports eyelid heaviness on right eye   Vision Assessment   Eye Alignment Within Functional Limits   Ocular Range of Motion Within Functional Limits   Tracking/Visual Pursuits Able to track stimulus in all quads without difficulty   Convergence Within functional limits   Visual Fields No apparent deficits   Acuity Able  to read newsprint without difficulty   Activity Tolerance   Activity Tolerance Tolerate 30+ min activity without fatigue   Activity Tolerance Comments Has returned to exercise at gym recently   Cognition   Overall Cognitive Status Impaired/Different from baseline   Area of Impairment Attention   Current Attention Level Selective   Attention Comments reports difficulty with divided tasks   Attention Selective   Memory Appears intact   Problem Solving Impaired   Problem Solving Impairment Verbal  complex;Functional complex   Sensation   Light Touch Impaired by gross assessment   Stereognosis Impaired by gross assessment   Hot/Cold Not tested   Proprioception Impaired by gross assessment   Additional Comments somatosensory loss appears more pronounced on ulnar aspect of hand and forearm   Coordination   Gross Motor Movements are Fluid and Coordinated Yes   Fine Motor Movements are Fluid and Coordinated No   Coordination and Movement Description Little dysmetria noted with gross movements, more evident in fine motor movement   Finger Nose Finger Test [facility]   9 Hole Peg Test Right;Left   Right 9 Hole Peg Test 1:50.94   Left 9 Hole Peg Test 23.94   Box and Blocks R:35, L:42   AROM   Overall AROM  Within functional limits for tasks performed   PROM   Overall PROM  Within functional limits for tasks performed   Hand Function   Right Hand Gross Grasp Impaired   Right Hand Grip (lbs) 70 lb   Right Hand Lateral Pinch 11 lbs   Right Hand 3 Point Pinch 15 lbs   Left Hand Gross Grasp Functional   Left Hand Grip (lbs) 118 lb   Left Hand Lateral Pinch 21 lbs   Left 3 point pinch 22 lbs                       OT Education - 06/26/14 1723    Education provided Yes   Education Details Stroke causes   Person(s) Educated Patient;Caregiver(s)   Methods Explanation   Comprehension Verbalized understanding          OT Short Term Goals - 06/26/14 1747    OT SHORT TERM GOAL #1   Title Patient will utilize her cell phone to call a family member / friend  using her right hand to "dial"   OT SHORT TERM GOAL #2   Title Patient will legibly write at the single word level using her right hand with AE as needed   OT SHORT TERM GOAL #3   Title Patient will demonstrate beginning keyboarding skills to type incorporating bilateral upper extremities   OT SHORT TERM GOAL #4   Title Patient will demonstrate adequate strength and coordination in right hand to fasten seat belt in car.     OT SHORT TERM GOAL #5   Title Patient will complete simple meal prep (cold) with verbal cueing only           OT Long Term Goals - 06/26/14 1748    OT LONG TERM GOAL #1   Title Patient will effectively don/ doff earrings or necklace using both hands   OT LONG TERM GOAL #2   Title Patient will legibly write at the sentence level using her right hand with AE as needed   OT LONG TERM GOAL #3   Title Patient will apply makeup as for church using bilateral upper extremities, with right hand as dominant.   OT LONG TERM GOAL #  4   Title Patient will demonstrate adequate coordination with keyboarding skills to send simple email to colleague or friend using bilateral upper extremities   OT LONG TERM GOAL #5   Title Patient will effectively pay bills and balance checking account with modified independence.     OT LONG TERM GOAL #6   Title Patient will prepare simple family meal utilizing stovetop and/or oven with modified independence               Plan - 06/26/14 1725    Clinical Impression Statement Patient is a 39 yr old right handed female, who was working full time prior to this stroke.  Patient is right hand dominant, and has limited sensation,strength, fine motor control and functional use of her right hand.     Pt will benefit from skilled therapeutic intervention in order to improve on the following deficits (Retired) Decreased cognition;Decreased coordination;Decreased strength;Impaired UE functional use;Impaired sensation   Rehab Potential Excellent   Clinical Impairments Affecting Rehab Potential expressive aphasia   OT Frequency 2x / week   OT Duration 8 weeks   OT Treatment/Interventions Self-care/ADL training;Therapeutic exercise;Neuromuscular education;DME and/or AE instruction;Therapeutic exercises;Therapeutic activities;Cognitive remediation/compensation;Patient/family education   Plan coordination and strengthening of right hand - functional use of right hand.    Initiate home activity program to integrate right hand into daily life skills   Consulted and Agree with Plan of Care Patient;Family member/caregiver   Family Member Consulted Mom        Problem List Patient Active Problem List   Diagnosis Date Noted  . Morbid obesity 06/04/2014  . Dysphagia, pharyngoesophageal phase 06/04/2014  . PFO with atrial septal aneurysm 06/04/2014  . Hyperlipidemia LDL goal <70 06/04/2014  . Ischemic brain damage   . Acute respiratory failure with hypoxia   . CVA (cerebral vascular accident) 05/29/2014  . Aphasia complicating stroke 05/29/2014  . Hemiplegia affecting dominant side 05/29/2014  . CONSTIPATION 03/09/2008  . IRRITABLE BOWEL SYNDROME 03/09/2008   Bretta Bang, OTR/L 06/26/2014 5:56 PM Phone: [phone] Fax: [phone]   [facility] [address] [place], [place], [address] Phone: [phone]   Fax:  [phone]

## 2014-06-26 NOTE — Therapy (Signed)
[facility] [address] [place], [place], [address] Phone: [phone]   Fax:  [phone]  Speech Language Pathology Treatment  Patient Details  Name: Rebecca Russo MRN: [id number] Date of Birth: Feb 11, 1976 Referring Provider:  Claude Manges, FNP  Encounter Date: 06/26/2014    Past Medical History  Diagnosis Date  . H/O varicella   . Increased BMI 12/17/08    Past Surgical History  Procedure Laterality Date  . Laparoscopic cholecystectomy  06/20/2012  . Radiology with anesthesia N/A 05/29/2014    Procedure: RADIOLOGY WITH ANESTHESIA;  Surgeon: Oneal Grout, MD;  Location: [facility] OR;  Service: Radiology;  Laterality: N/A;  . 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 Pt asked for repeats for directions appropriately, and usually. Yes/no questions with adjectives 80% success. Divergent naming completed with 70% success and extra time. Pt unaware of errors 50% of the time.    Assessment / Recommendations / Plan   Plan Continue with current plan of care   Progression Toward Goals   Progression toward goals Progressing toward goals            SLP Short Term Goals - 06/26/14 1615    SLP SHORT TERM GOAL #1   Title Pt will perform simple convergent and divergent naming tasks with 85% accuracy and occassional minimal assistance (due 07/10/14)   Time 2   Period Weeks   Status On-going   SLP SHORT TERM GOAL #2   Title Pt will demonstrate auditory comprehension of simple conversation and questions with 85% accuracy and occassional minimal assistance (07/10/14)   Time 2   Period Weeks   Status Achieved   SLP SHORT TERM GOAL #3   Title Pt will id object to phrase/sentence description with 85% accuracy and rare minimal assistance (07/10/14)   Time 2   Period Weeks   Status On-going   SLP SHORT TERM GOAL #4   Title ID object/picture to written description f:4 with 80% acccuracy and rare minimal assistance (07/10/14)   Time 2   Period Weeks   Status On-going          SLP Long Term Goals - 06/22/14 0857    SLP LONG TERM GOAL #1   Title Demonsrate compensations for aphasia during simple conversation 80% of opportunities and occassional minimal assistance (08/04/14)   Time 6   Period Weeks   Status On-going   SLP LONG TERM GOAL #2   Title Participate in simple covnersation verbally with 80% accuracy and occassional minimal assistance (08/04/14)   Time 6   Period Weeks   Status On-going   SLP LONG TERM GOAL #3   Title Comprehend simple 3-4 sentence paragraph with 80% accuracy and occassional min A. (08/04/14)   Time 6  Period Weeks   Status On-going   SLP LONG TERM GOAL #4   Title Perform simple functional written expression tasks with 80% accuracy and occassional minimal A (08/04/14)   Time 6   Period Weeks   Status On-going          Problem List Patient Active Problem List   Diagnosis Date Noted  . Morbid obesity 06/04/2014  . Dysphagia, pharyngoesophageal phase 06/04/2014  . PFO with atrial septal aneurysm 06/04/2014  . Hyperlipidemia LDL goal <70 06/04/2014  . Ischemic brain damage   . Acute respiratory failure with hypoxia   . CVA (cerebral vascular accident) 05/29/2014  .  Aphasia complicating stroke 05/29/2014  . Hemiplegia affecting dominant side 05/29/2014  . CONSTIPATION 03/09/2008  . IRRITABLE BOWEL SYNDROME 03/09/2008    Verdie Mosher, SLP 06/26/2014, 4:15 PM  [facility] [address] [place], [place], [address] Phone: [phone]   Fax:  [phone]

## 2014-06-27 ENCOUNTER — Encounter (HOSPITAL_COMMUNITY): Payer: Self-pay | Admitting: *Deleted

## 2014-06-27 ENCOUNTER — Emergency Department (HOSPITAL_COMMUNITY)
Admission: EM | Admit: 2014-06-27 | Discharge: 2014-06-27 | Disposition: A | Discharge status: Home / Self Care | Payer: Managed Care, Other (non HMO) | Attending: Emergency Medicine | Admitting: Emergency Medicine

## 2014-06-27 DIAGNOSIS — Z7902 Long term (current) use of antithrombotics/antiplatelets: Secondary | ICD-10-CM | POA: Insufficient documentation

## 2014-06-27 DIAGNOSIS — R0602 Shortness of breath: Secondary | ICD-10-CM | POA: Diagnosis not present

## 2014-06-27 DIAGNOSIS — Z7982 Long term (current) use of aspirin: Secondary | ICD-10-CM | POA: Diagnosis not present

## 2014-06-27 DIAGNOSIS — Z8673 Personal history of transient ischemic attack (TIA), and cerebral infarction without residual deficits: Secondary | ICD-10-CM | POA: Insufficient documentation

## 2014-06-27 DIAGNOSIS — E669 Obesity, unspecified: Secondary | ICD-10-CM | POA: Diagnosis not present

## 2014-06-27 DIAGNOSIS — Z79899 Other long term (current) drug therapy: Secondary | ICD-10-CM | POA: Diagnosis not present

## 2014-06-27 DIAGNOSIS — Z8619 Personal history of other infectious and parasitic diseases: Secondary | ICD-10-CM | POA: Diagnosis not present

## 2014-06-27 DIAGNOSIS — M25562 Pain in left knee: Secondary | ICD-10-CM | POA: Diagnosis not present

## 2014-06-27 LAB — D-DIMER, QUANTITATIVE (NOT AT ARMC): D-Dimer, Quant: 0.4 ug/mL-FEU (ref 0.00–0.48)

## 2014-06-27 NOTE — ED Notes (Signed)
Residual from recent stroke is expressive aphasia, right arm weakness.  Onset several days top of left knee swelling and pain to left knee, anterior thigh and left calf.

## 2014-06-27 NOTE — ED Notes (Signed)
Patient presents with knot on her left knee noticed 1-2 days ago

## 2014-06-27 NOTE — ED Provider Notes (Signed)
CSN: [id number]     Arrival date & time 06/27/14  2002 History  This chart was scribed for Everlene Farrier, PA-C working with Tilden Fossa, MD by Evon Slack, ED Scribe. This patient was seen in room TR07C/TR07C and the patient's care was started at 9:18 PM.      Chief Complaint  Patient presents with  . Leg Pain   HPI HPI Comments: Rebecca Russo is a 39 y.o. female who presents to the Emergency Department complaining of left knee pain onset 2 days ago. Pt states that she has associated swelling described as a "knot" on the knee. Pt states she has pain and swelling in her thigh that radiates from her knee. Pt reports intermittent SOB since her stroke several months ago that has not changed or worsened. Pt states that she feels as if the pain shoots up into her thigh. Pt states that the pain is worse with movement. Pt denies any injury or trauma. Pt denies any recent surgeries. Pt denies any recent long travel. Pt denies any calf tenderness, CP rash or numbness, or weakness. Patient denies personal or family history of DVTs or PEs. The patient denies personal or family history of blood clotting disorders such as factor V Leiden, protein C or S deficiency. The patient denies smoking. The patient is not on exogenous estrogens.    Past Medical History  Diagnosis Date  . H/O varicella   . Increased BMI 12/17/08  . Stroke    Past Surgical History  Procedure Laterality Date  . Laparoscopic cholecystectomy  06/20/2012  . Radiology with anesthesia N/A 05/29/2014    Procedure: RADIOLOGY WITH ANESTHESIA;  Surgeon: Oneal Grout, MD;  Location: [facility] OR;  Service: Radiology;  Laterality: N/A;  . Tee without cardioversion N/A 06/03/2014    Procedure: TRANSESOPHAGEAL ECHOCARDIOGRAM (TEE);  Surgeon: Wendall Stade, MD;  Location: [facility] ENDOSCOPY;  Service: Cardiovascular;  Laterality: N/A;   Family History  Problem Relation Age of Onset  . Hypertension Mother   . Hypertension Father    History   Substance Use Topics  . Smoking status: Never Smoker   . Smokeless tobacco: Not on file  . Alcohol Use: No   OB History    Gravida Para Term Preterm AB TAB SAB Ectopic Multiple Living   2 2 2       2      Review of Systems  Constitutional: Negative for fever and chills.  HENT: Negative for ear pain.   Eyes: Negative for pain and visual disturbance.  Respiratory: Positive for shortness of breath. Negative for cough and chest tightness.   Cardiovascular: Negative for chest pain and palpitations.  Gastrointestinal: Negative for nausea and abdominal pain.  Genitourinary: Negative for dysuria.  Musculoskeletal: Positive for myalgias and arthralgias. Negative for neck pain.  Skin: Negative for rash.  Neurological: Negative for numbness and headaches.      Allergies  Review of patient's allergies indicates no known allergies.  Home Medications   Prior to Admission medications   Medication Sig Start Date End Date Taking? Authorizing Provider  aspirin 325 MG tablet Take 1 tablet (325 mg total) by mouth daily. 06/03/14   Marvel Plan, MD  clopidogrel (PLAVIX) 75 MG tablet Take 1 tablet (75 mg total) by mouth daily. 06/22/14 06/22/15  Micheline Chapman, MD  rosuvastatin (CRESTOR) 5 MG tablet Take 1 tablet (5 mg total) by mouth daily at 6 PM. 06/03/14   Marvel Plan, MD   BP 125/89 mmHg  Pulse 86  Temp(Src) 98.8 F (37.1 C) (Oral)  Resp 16  SpO2 97%  LMP  (LMP Unknown)   Physical Exam  Constitutional: She appears well-developed and well-nourished. No distress.  Obese female. Nontoxic appearing.  HENT:  Head: Normocephalic and atraumatic.  Mouth/Throat: Oropharynx is clear and moist. No oropharyngeal exudate.  Eyes: Conjunctivae are normal. Pupils are equal, round, and reactive to light. Right eye exhibits no discharge. Left eye exhibits no discharge.  Neck: Neck supple.  Cardiovascular: Normal rate, regular rhythm and intact distal pulses.   Bilateral radial pulses are intact.  Bilateral dorsalis pedis and posterior tibialis pulses are intact.  Pulmonary/Chest: Effort normal and breath sounds normal. No respiratory distress. She has no wheezes. She has no rhonchi. She has no rales. She exhibits no tenderness.  Abdominal: Soft. There is no tenderness.  Musculoskeletal: Normal range of motion.  Patient is spontaneously moving all extremities in a coordinated fashion exhibiting good strength. The patient has a small raised area on the anterior portion of her left knee that is appreciated only when her leg is in extension. Exam is not consistent with a cyst or abscess. There is no overlying edema or erythema. No induration. There is no evidence of Baker's cyst. Bilateral calves are equal in size without edema. I do not appreciate any thigh edema bilaterally. Lesions calves and thighs are nontender to palpation. There is no rashes or erythema noted. Patient has good strength and range of motion in her bilateral lower extremities.   Lymphadenopathy:    She has no cervical adenopathy.  Neurological: She is alert. Coordination normal.  Skin: Skin is warm and dry. No rash noted. She is not diaphoretic. No erythema. No pallor.  Psychiatric: She has a normal mood and affect. Her behavior is normal.  Nursing note and vitals reviewed.   ED Course  Procedures (including critical care time) DIAGNOSTIC STUDIES: Oxygen Saturation is 99% on RA, normal by my interpretation.    COORDINATION OF CARE: 9:48 PM-Discussed treatment plan with pt at bedside and pt agreed to plan.    Labs Review Labs Reviewed  D-DIMER, QUANTITATIVE    Imaging Review No results found.   EKG Interpretation None      Filed Vitals:   06/27/14 2030 06/27/14 2311  BP: 148/81 125/89  Pulse: 97 86  Temp: 98.9 F (37.2 C) 98.8 F (37.1 C)  TempSrc: Oral Oral  Resp: 22 16  SpO2: 99% 97%     MDM   Meds given in ED:  Medications - No data to display  Discharge Medication List as of 06/27/2014  11:11 PM      Final diagnoses:  Knee pain, acute, left   This is a 39 year old female with a history of a CVA who presents to the emergency department complaining of a knot in her left knee and pain and swelling in her left thigh for the past 2 days. The patient denies any injury or trauma. The patient and her family are concerned for blood clot. Patient has a small lump appreciated on the anterior aspect of her left knee that is appreciated only in her leg is in extension. Patient's exam is otherwise benign. I appreciate no calf tenderness or edema. I appreciate no thigh edema. After discussing this case with Dr. Madilyn Hook and doing joint decision making the patient we decided to check a d-dimer. The patient's d-dimer is negative. Will not need a lower extremity ultrasound. I advised patient to follow-up with primary care and with physical therapy this week.  I advised she could take aspirin or Tylenol as needed for her pain. Strict return precautions were provided. I advised the patient to return to the emergency department with new or worsening symptoms including chest pain, palpitations, shortness of breath, unilateral leg swelling, worsening pain, or new concerns. The patient and her family verbalized understanding and agreement with plan.   This patient was discussed with Dr. Madilyn Hook who agrees with assessment and plan.  I personally performed the services described in this documentation, which was scribed in my presence. The recorded information has been reviewed and is accurate.       Lawana Chambers, PA-C 06/28/14 1610  Tilden Fossa, MD 06/28/14 2300

## 2014-06-27 NOTE — Discharge Instructions (Signed)

## 2014-06-29 ENCOUNTER — Ambulatory Visit: Payer: Managed Care, Other (non HMO)

## 2014-06-29 ENCOUNTER — Encounter: Payer: Managed Care, Other (non HMO) | Admitting: Occupational Therapy

## 2014-06-30 ENCOUNTER — Encounter: Payer: Managed Care, Other (non HMO) | Admitting: Occupational Therapy

## 2014-06-30 ENCOUNTER — Telehealth: Payer: Self-pay | Admitting: Occupational Therapy

## 2014-06-30 NOTE — Telephone Encounter (Signed)
Therapist called pt. and left a message regarding missed appointment on answering machine.  Keene Breath, OTR/L [phone] Phone: [phone] 5:21 PM 06/30/2014

## 2014-07-01 ENCOUNTER — Ambulatory Visit: Payer: Managed Care, Other (non HMO)

## 2014-07-02 ENCOUNTER — Ambulatory Visit: Payer: Managed Care, Other (non HMO) | Admitting: Cardiovascular Disease

## 2014-07-02 NOTE — Progress Notes (Signed)
ASA and Plavix were given via NG tube by CRNA.

## 2014-07-03 ENCOUNTER — Ambulatory Visit: Payer: Managed Care, Other (non HMO)

## 2014-07-03 DIAGNOSIS — I69320 Aphasia following cerebral infarction: Secondary | ICD-10-CM | POA: Diagnosis not present

## 2014-07-03 DIAGNOSIS — R482 Apraxia: Secondary | ICD-10-CM

## 2014-07-03 NOTE — Patient Instructions (Signed)
Practice at least 15-20 minutes per day, three times a day.

## 2014-07-03 NOTE — Therapy (Signed)
[facility] [address] [place], [place], [address] Phone: [phone]   Fax:  [phone]  Speech Language Pathology Treatment  Patient Details  Name: Rebecca Russo MRN: [id number] Date of Birth: Aug 02, 1975 Referring Provider:  Claude Manges, FNP  Encounter Date: 07/03/2014      End of Session - 07/03/14 1247    Visit Number 6   Number of Visits 24   Date for SLP Re-Evaluation 08/04/14   SLP Start Time 1149   SLP Stop Time  1232   SLP Time Calculation (min) 43 min   Activity Tolerance Patient tolerated treatment well      Past Medical History  Diagnosis Date  . H/O varicella   . Increased BMI 12/17/08  . Stroke     Past Surgical History  Procedure Laterality Date  . Laparoscopic cholecystectomy  06/20/2012  . Radiology with anesthesia N/A 05/29/2014    Procedure: RADIOLOGY WITH ANESTHESIA;  Surgeon: Oneal Grout, MD;  Location: [facility] OR;  Service: Radiology;  Laterality: N/A;  . Tee without cardioversion N/A 06/03/2014    Procedure: TRANSESOPHAGEAL ECHOCARDIOGRAM (TEE);  Surgeon: Wendall Stade, MD;  Location: [facility] ENDOSCOPY;  Service: Cardiovascular;  Laterality: N/A;    There were no vitals taken for this visit.  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  Apraxia of speech      Subjective Assessment - 07/03/14 1157    Symptoms "I gotta go." (to mother.)             ADULT SLP TREATMENT - 07/03/14 1157    General Information   Behavior/Cognition Alert;Cooperative;Pleasant mood   Treatment Provided   Treatment provided Cognitive-Linquistic   Pain Assessment   Pain Assessment No/denies pain   Cognitive-Linquistic Treatment   Treatment focused on Apraxia;Aphasia   Skilled Treatment Yes/no questions (multi-unit) 85% success with requests for repeats.Directions around the clinic (functional directions) understood with 60% success, incr'd to 85% with min A; 95% with mod A.  Confrontatiion naming with 88% success.    Assessment / Recommendations / Plan   Plan Continue with current plan of care   Progression Toward Goals   Progression toward goals Progressing toward goals            SLP Short Term Goals - 07/03/14 1249    SLP SHORT TERM GOAL #1   Title Pt will perform simple convergent and divergent naming tasks with 85% accuracy and occassional minimal assistance (due 07/10/14)   Time 2   Period Weeks   Status Achieved   SLP SHORT TERM GOAL #2   Title Pt will demonstrate auditory comprehension of simple conversation and questions with 85% accuracy and occassional minimal assistance (07/10/14)   Time 2   Period Weeks   Status Achieved   SLP SHORT TERM GOAL #3   Title Pt will id object to phrase/sentence description with 85% accuracy and rare minimal assistance (07/10/14)   Time 2   Period Weeks   Status On-going   SLP SHORT TERM GOAL #4   Title ID object/picture to written description f:4 with 80% acccuracy and rare minimal assistance (07/10/14)   Time 2   Period Weeks   Status On-going          SLP Long Term Goals - 07/03/14 1249    SLP LONG TERM GOAL #1   Title Demonsrate compensations for aphasia during simple conversation 80% of opportunities and occassional minimal assistance (08/04/14)   Time 6   Period Weeks  Status On-going   SLP LONG TERM GOAL #2   Title Participate in simple covnersation verbally with 80% accuracy and occassional minimal assistance (08/04/14)   Time 6   Period Weeks   Status On-going   SLP LONG TERM GOAL #3   Title Comprehend simple 3-4 sentence paragraph with 80% accuracy and occassional min A. (08/04/14)   Time 6   Period Weeks   Status On-going   SLP LONG TERM GOAL #4   Title Perform simple functional written expression tasks with 80% accuracy and occassional minimal A (08/04/14)   Time 6   Period Weeks   Status On-going          Plan - 07/03/14 1247    Clinical Impression Statement Pt cont with  verbal expression deficits, and recptive language deficits hindering independence and possibility to return to work.   Speech Therapy Frequency 3x / week  pt is scheduling x2/week   Duration --  6 weeks   Treatment/Interventions Language facilitation;Cueing hierarchy;Functional tasks;Patient/family education;Compensatory strategies;SLP instruction and feedback   Potential to Achieve Goals Good   Potential Considerations Severity of impairments        Problem List Patient Active Problem List   Diagnosis Date Noted  . Morbid obesity 06/04/2014  . Dysphagia, pharyngoesophageal phase 06/04/2014  . PFO with atrial septal aneurysm 06/04/2014  . Hyperlipidemia LDL goal <70 06/04/2014  . Ischemic brain damage   . Acute respiratory failure with hypoxia   . CVA (cerebral vascular accident) 05/29/2014  . Aphasia complicating stroke 05/29/2014  . Hemiplegia affecting dominant side 05/29/2014  . CONSTIPATION 03/09/2008  . IRRITABLE BOWEL SYNDROME 03/09/2008    Verdie Mosher, SLP 07/03/2014, 12:50 PM  [facility] [address] [place], [place], [address] Phone: [phone]   Fax:  [phone]

## 2014-07-04 ENCOUNTER — Encounter (HOSPITAL_COMMUNITY): Payer: Self-pay | Admitting: Adult Health

## 2014-07-04 ENCOUNTER — Encounter (HOSPITAL_COMMUNITY): Payer: Self-pay | Admitting: *Deleted

## 2014-07-04 ENCOUNTER — Emergency Department (HOSPITAL_COMMUNITY)
Admission: EM | Admit: 2014-07-04 | Discharge: 2014-07-04 | Disposition: A | Discharge status: Home / Self Care | Payer: Managed Care, Other (non HMO) | Attending: Emergency Medicine | Admitting: Emergency Medicine

## 2014-07-04 ENCOUNTER — Emergency Department (INDEPENDENT_AMBULATORY_CARE_PROVIDER_SITE_OTHER)
Admission: EM | Admit: 2014-07-04 | Discharge: 2014-07-04 | Disposition: A | Discharge status: Nursing Home (Includes ICF) | Payer: Managed Care, Other (non HMO) | Source: Home / Self Care

## 2014-07-04 ENCOUNTER — Emergency Department (HOSPITAL_COMMUNITY): Payer: Managed Care, Other (non HMO)

## 2014-07-04 DIAGNOSIS — Z3202 Encounter for pregnancy test, result negative: Secondary | ICD-10-CM | POA: Insufficient documentation

## 2014-07-04 DIAGNOSIS — R52 Pain, unspecified: Secondary | ICD-10-CM

## 2014-07-04 DIAGNOSIS — Q249 Congenital malformation of heart, unspecified: Secondary | ICD-10-CM | POA: Diagnosis not present

## 2014-07-04 DIAGNOSIS — IMO0002 Reserved for concepts with insufficient information to code with codable children: Secondary | ICD-10-CM

## 2014-07-04 DIAGNOSIS — Z79899 Other long term (current) drug therapy: Secondary | ICD-10-CM | POA: Diagnosis not present

## 2014-07-04 DIAGNOSIS — R0789 Other chest pain: Secondary | ICD-10-CM | POA: Diagnosis not present

## 2014-07-04 DIAGNOSIS — I69320 Aphasia following cerebral infarction: Secondary | ICD-10-CM

## 2014-07-04 DIAGNOSIS — R4701 Aphasia: Secondary | ICD-10-CM | POA: Diagnosis not present

## 2014-07-04 DIAGNOSIS — Z7982 Long term (current) use of aspirin: Secondary | ICD-10-CM | POA: Insufficient documentation

## 2014-07-04 DIAGNOSIS — I639 Cerebral infarction, unspecified: Secondary | ICD-10-CM

## 2014-07-04 DIAGNOSIS — R079 Chest pain, unspecified: Secondary | ICD-10-CM

## 2014-07-04 DIAGNOSIS — Z8619 Personal history of other infectious and parasitic diseases: Secondary | ICD-10-CM | POA: Insufficient documentation

## 2014-07-04 DIAGNOSIS — Z8673 Personal history of transient ischemic attack (TIA), and cerebral infarction without residual deficits: Secondary | ICD-10-CM | POA: Diagnosis not present

## 2014-07-04 DIAGNOSIS — R0602 Shortness of breath: Secondary | ICD-10-CM | POA: Diagnosis present

## 2014-07-04 LAB — COMPREHENSIVE METABOLIC PANEL
ALT: 13 U/L (ref 0–35)
AST: 25 U/L (ref 0–37)
Albumin: 3.9 g/dL (ref 3.5–5.2)
Alkaline Phosphatase: 76 U/L (ref 39–117)
Anion gap: 10 (ref 5–15)
BUN: 7 mg/dL (ref 6–23)
CO2: 22 mmol/L (ref 19–32)
Calcium: 9.4 mg/dL (ref 8.4–10.5)
Chloride: 107 mmol/L (ref 96–112)
Creatinine, Ser: 0.77 mg/dL (ref 0.50–1.10)
GFR calc Af Amer: >90 mL/min (ref >90)
Glucose, Bld: 72 mg/dL (ref 70–99)
Potassium: 3.7 mmol/L (ref 3.5–5.1)
SODIUM: 139 mmol/L (ref 135–145)
Total Bilirubin: 0.4 mg/dL (ref 0.3–1.2)
Total Protein: 7.9 g/dL (ref 6.0–8.3)

## 2014-07-04 LAB — CBC
HCT: 37.8 % (ref 36.0–46.0)
Hemoglobin: 12.9 g/dL (ref 12.0–15.0)
MCH: 30.9 pg (ref 26.0–34.0)
MCHC: 34.1 g/dL (ref 30.0–36.0)
MCV: 90.6 fL (ref 78.0–100.0)
Platelets: 413 10*3/uL — ABNORMAL HIGH (ref 150–400)
RBC: 4.17 MIL/uL (ref 3.87–5.11)
RDW: 14.1 % (ref 11.5–15.5)
WBC: 11.6 10*3/uL — AB (ref 4.0–10.5)

## 2014-07-04 LAB — I-STAT TROPONIN, ED: TROPONIN I, POC: 0 ng/mL (ref 0.00–0.08)

## 2014-07-04 LAB — BRAIN NATRIURETIC PEPTIDE: B Natriuretic Peptide: 20.6 pg/mL (ref 0.0–100.0)

## 2014-07-04 LAB — POC URINE PREG, ED: PREG TEST UR: NEGATIVE

## 2014-07-04 MED ORDER — ALPRAZOLAM 0.25 MG PO TABS
0.2500 mg | ORAL_TABLET | ORAL | Status: AC
  Administered 2014-07-04: 0.25 mg via ORAL
  Filled 2014-07-04: qty 1

## 2014-07-04 MED ORDER — OXYCODONE-ACETAMINOPHEN 5-325 MG PO TABS: 1.0000 | ORAL_TABLET | Freq: Four times a day (QID) | ORAL | Status: DC | PRN

## 2014-07-04 NOTE — Discharge Instructions (Signed)
Your symptoms are concerning for a cardiac cause of your chest pain You will be transported to the emergency room for further evaluation

## 2014-07-04 NOTE — ED Notes (Signed)
Report called to Eileen Stanford, ED First Nurse.

## 2014-07-04 NOTE — ED Notes (Signed)
Presents with 4 days of SOB, pressure in chest and inability to sleep lying down. Endorses pedal edema and SOB is worse with exertion as well as chest pain.  Denies nausea and vomiting.

## 2014-07-04 NOTE — ED Notes (Signed)
C/O generalized weakness and SOB since last wk - has been continual since ED visit - not any worse per pt, but not any better.  Has also been having intermittent chest pressure over past few days - denies any chest pressure at present.  Has heart surgery scheduled for 2/25.

## 2014-07-04 NOTE — ED Provider Notes (Signed)
CSN: [id number]     Arrival date & time 07/04/14  1815 History   None    Chief Complaint  Patient presents with  . Fatigue  . Shortness of Breath   (Consider location/radiation/quality/duration/timing/severity/associated sxs/prior Treatment) HPI   CP and heaviness: feels like someone is pressing on chest. Associated w/ SOB and palpitations. Started on 06/29/14. Typically at night and in the morning. Pt reports now having to sleep sitting up due to SOB. Associated w/ swelling in RLE. Wakes up from sleep in pain. Worse w/ exertion. intermittent  Difficulty speaking since stroke. Pt encounter aided by mother.     Past Medical History  Diagnosis Date  . H/O varicella   . Increased BMI 12/17/08  . Stroke   . Heart defect    Past Surgical History  Procedure Laterality Date  . Laparoscopic cholecystectomy  06/20/2012  . Radiology with anesthesia N/A 05/29/2014    Procedure: RADIOLOGY WITH ANESTHESIA;  Surgeon: Oneal Grout, MD;  Location: [facility] OR;  Service: Radiology;  Laterality: N/A;  . Tee without cardioversion N/A 06/03/2014    Procedure: TRANSESOPHAGEAL ECHOCARDIOGRAM (TEE);  Surgeon: Wendall Stade, MD;  Location: [facility] ENDOSCOPY;  Service: Cardiovascular;  Laterality: N/A;   Family History  Problem Relation Age of Onset  . Hypertension Mother   . Hypertension Father    History  Substance Use Topics  . Smoking status: Never Smoker   . Smokeless tobacco: Not on file  . Alcohol Use: No   OB History    Gravida Para Term Preterm AB TAB SAB Ectopic Multiple Living   Review of Systems Per HPI with all other pertinent systems negative.   Allergies  Review of patient's allergies indicates no known allergies.  Home Medications   Prior to Admission medications   Medication Sig Start Date End Date Taking? Authorizing Provider  aspirin 325 MG tablet Take 1 tablet (325 mg total) by mouth daily. 06/03/14  Yes Marvel Plan, MD  clopidogrel (PLAVIX) 75 MG tablet  Take 1 tablet (75 mg total) by mouth daily. 06/22/14 06/22/15 Yes Micheline Chapman, MD  rosuvastatin (CRESTOR) 5 MG tablet Take 1 tablet (5 mg total) by mouth daily at 6 PM. 06/03/14  Yes Marvel Plan, MD   BP 112/59 mmHg  Pulse 70  Temp(Src) 98.6 F (37 C) (Oral)  Resp 28  SpO2 100% Physical Exam  Constitutional: She is oriented to person, place, and time. She appears well-developed and well-nourished.  HENT:  Head: Normocephalic and atraumatic.  Eyes: EOM are normal. Pupils are equal, round, and reactive to light.  Neck: Normal range of motion. Neck supple.  Cardiovascular: Normal rate.   Pulmonary/Chest: Effort normal and breath sounds normal.  Abdominal: Soft. Bowel sounds are normal.  Musculoskeletal: Normal range of motion.  Trace bilat LE edema  Neurological: She is alert and oriented to person, place, and time.  Skin: Skin is warm.  Psychiatric: She has a normal mood and affect. Her behavior is normal. Judgment and thought content normal.    ED Course  Procedures (including critical care time) Labs Review Labs Reviewed - No data to display  Imaging Review No results found.   MDM   1. Stroke   2. Chest pain, unspecified chest pain type   3. Pain   4. Aphasia complicating stroke    Pt not currently having CP  Pt likely to need ED evaluation and possible Obs admission  due to complaints of pressure of crushing type exertional CP w/ associated SOB and need to sleep sitting up along w/ h/o recent stroke and Grade 1diastolic CHF w/ known PFO. No significant LE swelling.   Pt to be transported to [facility]  Shelly Flatten, MD Family Medicine 07/04/2014, 7:13 PM      Ozella Rocks, MD 07/04/14 [phone]

## 2014-07-04 NOTE — ED Notes (Signed)
Patient presents with c/o SOB over approx 4 days.  Patient was at Urgent Care and sent here.  Patient and family member stated that Urgent Care stated she had pedal edema but this nurse does not see the same.  Patient had a stroke in January and has thought delay since.  Patient stated she had centralized CP and when asked if it radiated any where stated that she hurts all over and cannot determine what is new or from her stroke.  Family member stated she went shopping and felt like she was going to pass out so they went home.  Tearful at this time

## 2014-07-04 NOTE — ED Provider Notes (Signed)
CSN: [id number]     Arrival date & time 07/04/14  7829 History   First MD Initiated Contact with Patient 07/04/14 1927     Chief Complaint  Patient presents with  . Shortness of Breath     (Consider location/radiation/quality/duration/timing/severity/associated sxs/prior Treatment) Patient is a 39 y.o. female presenting with shortness of breath. The history is provided by the patient.  Shortness of Breath Associated symptoms: chest pain   Associated symptoms: no abdominal pain, no cough, no fever, no headaches, no neck pain and no vomiting   Chest pain:    Quality:  Pressure   Severity:  Mild   Onset quality:  Gradual   Timing:  Intermittent   Progression:  Unchanged   Chronicity:  New   Past Medical History  Diagnosis Date  . H/O varicella   . Increased BMI 12/17/08  . Stroke   . Heart defect    Past Surgical History  Procedure Laterality Date  . Laparoscopic cholecystectomy  06/20/2012  . Radiology with anesthesia N/A 05/29/2014    Procedure: RADIOLOGY WITH ANESTHESIA;  Surgeon: Oneal Grout, MD;  Location: [facility] OR;  Service: Radiology;  Laterality: N/A;  . Tee without cardioversion N/A 06/03/2014    Procedure: TRANSESOPHAGEAL ECHOCARDIOGRAM (TEE);  Surgeon: Wendall Stade, MD;  Location: [facility] ENDOSCOPY;  Service: Cardiovascular;  Laterality: N/A;   Family History  Problem Relation Age of Onset  . Hypertension Mother   . Hypertension Father    History  Substance Use Topics  . Smoking status: Never Smoker   . Smokeless tobacco: Not on file  . Alcohol Use: No   OB History    Gravida Para Term Preterm AB TAB SAB Ectopic Multiple Living   Review of Systems  Constitutional: Negative for fever and fatigue.  HENT: Negative for congestion and drooling.   Eyes: Negative for pain.  Respiratory: Positive for shortness of breath. Negative for cough.   Cardiovascular: Positive for chest pain.  Gastrointestinal: Negative for nausea, vomiting, abdominal  pain and diarrhea.  Genitourinary: Negative for dysuria and hematuria.  Musculoskeletal: Negative for back pain, gait problem and neck pain.  Skin: Negative for color change.  Neurological: Negative for dizziness and headaches.  Hematological: Negative for adenopathy.  Psychiatric/Behavioral: Negative for behavioral problems.  All other systems reviewed and are negative.     Allergies  Review of patient's allergies indicates no known allergies.  Home Medications   Prior to Admission medications   Medication Sig Start Date End Date Taking? Authorizing Provider  aspirin 325 MG tablet Take 1 tablet (325 mg total) by mouth daily. 06/03/14   Marvel Plan, MD  clopidogrel (PLAVIX) 75 MG tablet Take 1 tablet (75 mg total) by mouth daily. 06/22/14 06/22/15  Micheline Chapman, MD  rosuvastatin (CRESTOR) 5 MG tablet Take 1 tablet (5 mg total) by mouth daily at 6 PM. 06/03/14   Marvel Plan, MD   BP 107/45 mmHg  Pulse 91  Temp(Src) 98.7 F (37.1 C) (Oral)  Resp 23  Ht  (1.778 m)  Wt 339 lb (153.769 kg)  BMI 48.64 kg/m2  SpO2 100%  LMP  (LMP Unknown) Physical Exam  Constitutional: She is oriented to person, place, and time. She appears well-developed and well-nourished.  HENT:  Head: Normocephalic.  Mouth/Throat: Oropharynx is clear and moist. No oropharyngeal exudate.  Eyes: Conjunctivae and EOM are normal. Pupils are equal, round, and reactive to light.  Neck: Normal range of motion. Neck supple.  Cardiovascular: Normal rate, regular rhythm, normal heart sounds and intact distal pulses.  Exam reveals no gallop and no friction rub.   No murmur heard. Pulmonary/Chest: Effort normal and breath sounds normal. No respiratory distress. She has no wheezes.  Abdominal: Soft. Bowel sounds are normal. There is no tenderness. There is no rebound and no guarding.  Musculoskeletal: Normal range of motion. She exhibits no edema or tenderness.  Symmetric LE's w/out focal ttp. No obvious edema on my  exam.   Neurological: She is alert and oriented to person, place, and time.  Mild expressive aphasia at baseline.  Skin: Skin is warm and dry.  Psychiatric: She has a normal mood and affect. Her behavior is normal.  Nursing note and vitals reviewed.   ED Course  Procedures (including critical care time) Labs Review Labs Reviewed  CBC - Abnormal; Notable for the following:    WBC 11.6 (*)    Platelets 413 (*)    All other components within normal limits  COMPREHENSIVE METABOLIC PANEL  BRAIN NATRIURETIC PEPTIDE  I-STAT TROPOININ, ED  POC URINE PREG, ED    Imaging Review Dg Chest 2 View  07/04/2014   CLINICAL DATA:  Initial evaluation for central chest pain and shortness of breath for 5 days  EXAM: CHEST  2 VIEW  COMPARISON:  05/31/2014  FINDINGS: The heart size and mediastinal contours are within normal limits. Both lungs are clear. The visualized skeletal structures are unremarkable.  IMPRESSION: No active cardiopulmonary disease.   Electronically Signed   By: Esperanza Heir M.D.   On: 07/04/2014 20:26     EKG Interpretation None       Date: 07/05/2014  Rate: 77  Rhythm: normal sinus rhythm  QRS Axis: normal  Intervals: normal  ST/T Wave abnormalities: normal  Conduction Disutrbances:none  Narrative Interpretation: Occasional PVC's, no significant change  Old EKG Reviewed: unchanged    MDM   Final diagnoses:  Other chest pain    8:05 PM 39 y.o. female w hx of recent CVA, PFO, diastolic dysfunction on recent echo who presents with chest pressure. She states that she has had intermittent chest pressure for the last week and it typically lasts minutes at a time. She notes that she has some worsening symptoms when lying flat and also feels short of breath. She is currently asymptomatic and denies chest pain on my exam. Her vital signs are unremarkable here. We'll get screening labs and imaging.   10:53 PM: I interpreted/reviewed the labs and/or imaging which were  non-contributory.  Pt has remained asx during her workup here. I discussed case w/ on call cards fellow Dorris Fetch) to notify cardiology of her presence in the ED given that she has plans for PFO repair next week, no further workup recommended today from a cardiology standpoint, will have pt call her cardiologist on Monday. At the end of her workup I discussed further options to evaluate her. I offered a d-dimer to screen for PE as this was not initially done. I do have a low suspicion for this given that her sx are intermittent, she is not tachycardic, and has been asx here. She is low risk per [place], but cannot perc as she is on Nexplanon. She preferred to go home and f/u w/ her cardiologist. Given low risk well's w/ low suspicion, I felt like this was reasonable.  I have discussed the diagnosis/risks/treatment options with the patient and believe the pt to be eligible  for discharge home to follow-up with cardiology next week. We also discussed returning to the ED immediately if new or worsening sx occur. We discussed the sx which are most concerning (e.g., worsening cp/sob, fever, any escalation or recurrence of sx) that necessitate immediate return. Medications administered to the patient during their visit and any new prescriptions provided to the patient are listed below.  Medications given during this visit Medications  ALPRAZolam Prudy Feeler) tablet 0.25 mg (0.25 mg Oral Given 07/04/14 2049)    New Prescriptions   OXYCODONE-ACETAMINOPHEN (PERCOCET) 5-325 MG PER TABLET    Take 1 tablet by mouth every 6 (six) hours as needed for moderate pain.     Purvis Sheffield, MD 07/05/14 1145

## 2014-07-04 NOTE — ED Notes (Signed)
[facility] EKG shown to Dr. Judd Lien. @ 1925. No need to repeat at this time.

## 2014-07-04 NOTE — Discharge Instructions (Signed)

## 2014-07-06 ENCOUNTER — Other Ambulatory Visit (INDEPENDENT_AMBULATORY_CARE_PROVIDER_SITE_OTHER): Payer: Managed Care, Other (non HMO) | Admitting: *Deleted

## 2014-07-06 DIAGNOSIS — Q211 Atrial septal defect: Secondary | ICD-10-CM

## 2014-07-06 DIAGNOSIS — Q2112 Patent foramen ovale: Secondary | ICD-10-CM

## 2014-07-06 LAB — CBC
HEMATOCRIT: 35.5 % — AB (ref 36.0–46.0)
HEMOGLOBIN: 11.9 g/dL — AB (ref 12.0–15.0)
MCHC: 33.7 g/dL (ref 30.0–36.0)
MCV: 90.4 fl (ref 78.0–100.0)
PLATELETS: 425 10*3/uL — AB (ref 150.0–400.0)
RBC: 3.92 Mil/uL (ref 3.87–5.11)
RDW: 14.5 % (ref 11.5–15.5)
WBC: 11 10*3/uL — ABNORMAL HIGH (ref 4.0–10.5)

## 2014-07-06 LAB — PROTIME-INR
INR: 1.1 ratio — AB (ref 0.8–1.0)
PROTHROMBIN TIME: 12.3 s (ref 9.6–13.1)

## 2014-07-07 ENCOUNTER — Encounter: Payer: Self-pay | Admitting: Neurology

## 2014-07-07 ENCOUNTER — Ambulatory Visit (INDEPENDENT_AMBULATORY_CARE_PROVIDER_SITE_OTHER): Payer: Managed Care, Other (non HMO) | Admitting: Neurology

## 2014-07-07 VITALS — BP 116/72 | HR 74 | Ht 70.75 in | Wt 329.0 lb

## 2014-07-07 DIAGNOSIS — Q2112 Patent foramen ovale: Secondary | ICD-10-CM

## 2014-07-07 DIAGNOSIS — Q211 Atrial septal defect: Secondary | ICD-10-CM

## 2014-07-07 DIAGNOSIS — I639 Cerebral infarction, unspecified: Secondary | ICD-10-CM

## 2014-07-07 DIAGNOSIS — I253 Aneurysm of heart: Secondary | ICD-10-CM

## 2014-07-07 LAB — BASIC METABOLIC PANEL
BUN: 8 mg/dL (ref 6–23)
CALCIUM: 9.4 mg/dL (ref 8.4–10.5)
CO2: 23 mEq/L (ref 19–32)
Chloride: 107 mEq/L (ref 96–112)
Creatinine, Ser: 0.76 mg/dL (ref 0.40–1.20)
GFR: 108.99 mL/min (ref >60.00)
GLUCOSE: 97 mg/dL (ref 70–99)
POTASSIUM: 3.9 meq/L (ref 3.5–5.1)
SODIUM: 138 meq/L (ref 135–145)

## 2014-07-07 NOTE — Progress Notes (Signed)
STROKE NEUROLOGY FOLLOW UP NOTE  NAME: Rebecca Russo DOB: 08/09/1975  REASON FOR VISIT: stroke follow up HISTORY FROM: pt and mom and chart  Today we had the pleasure of seeing Rebecca Russo in follow-up at our Neurology Clinic. Pt was accompanied by mom.   History Summary Rebecca Russo is a 39 y.o. Obese female with no significant past medical history presenting with right hemiparesis, aphasia and left gaze on waking up on 06/04/14. The patient did not receive TPA secondary to unknown time of onset. Perfusion scan revealed left brain large penumbra. 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 She is status post mechanical thrombectomy with TICI3 recannulization. Repeat CTA head and neck showed patent vessels. Etiology for stroke not clear, stroke work up so far negative except large PFO with mobile ASA. Risk factor including progestin implant (Nexplanon) use for contraception , obesity, large PFO with mobile ASA.discharged with ASA and crestor for stroke prevention.   Interval History During the interval time, the patient has been doing well. She is going to get speech and PT outpt rehab. She also followed up with Dr. Excell Seltzer and was put on dural antiplatelet and will do the endovascular PFO closure 07/09/14. After discharge, she went to ER twice for left knee pain with nodule and with chest pain which considered to be due to anxiety and stress. She feels great today. Her speech is much better and only has mild right hand dexterity difficulty. She has Nexplanon implant at left arm, which she got just before the stroke and lasts for one year.   REVIEW OF SYSTEMS: Full 14 system review of systems performed and notable only for those listed below and in HPI above, all others are negative:  Constitutional:   Cardiovascular:  Ear/Nose/Throat:   Skin:  Eyes:   Respiratory:   Gastroitestinal:   Genitourinary:  Hematology/Lymphatic:   Endocrine:  Musculoskeletal:     Allergy/Immunology:   Neurological:   Psychiatric:  Sleep:   The following represents the patient's updated allergies and side effects list: No Known Allergies  The neurologically relevant items on the patient's problem list were reviewed on today's visit.  Neurologic Examination  A problem focused neurological exam (12 or more points of the single system neurologic examination, vital signs counts as 1 point, cranial nerves count for 8 points) was performed.  Blood pressure 116/72, pulse 74, height 5' 10.75" (1.797 m), weight 329 lb (149.233 kg).  General - Morbid obesity, well developed, in no apparent distress.  Ophthalmologic - Sharp disc margins OU.  Cardiovascular - Regular rate and rhythm with no murmur.  Mental Status -  Level of arousal and orientation to time, place, and person were intact. Partial expressive aphasia with hesitancy of speech and word finding difficulties. Able to repeat, mild deficit on naming, intact on comprehension.   Cranial Nerves II - XII - II - Visual field intact OU. III, IV, VI - Extraocular movements intact. V - Facial sensation intact bilaterally. VII - Facial movement intact bilaterally. VIII - Hearing & vestibular intact bilaterally. X - Palate elevates symmetrically. XI - Chin turning & shoulder shrug intact bilaterally. XII - Tongue protrusion intact.  Motor Strength - The patient's strength was normal in all extremities and pronator drift was absent except mild right hand dexterity deficit.  Bulk was normal and fasciculations were absent.   Motor Tone - Muscle tone was assessed at the neck and appendages and was normal.  Reflexes - The patient's reflexes were  normal in all extremities and she had no pathological reflexes.  Sensory - Light touch, temperature/pinprick, vibration and proprioception, and Romberg testing were assessed and were normal.    Coordination - The patient had normal movements in the hands and feet with no  ataxia or dysmetria.  Tremor was absent.  Gait and Station - The patient's transfers, posture, gait, station, and turns were observed as normal.  Data reviewed: I personally reviewed the images and agree with the radiology interpretations.  Ct Head Wo Contrast 05/30/2014 Subtle CT changes of left MCA territory infarct, most apparent at the insula, with no associated hemorrhage or mass effect. No new intracranial abnormality.  05/29/2014 Subtle decreased sulcation in the left cerebral hemisphere raising the question for diffuse edema. No evidence for acute intracranial hemorrhage.  05/29/2014 Motion degraded exam showing no definite acute stroke or hemorrhage. Hyperdense LEFT ICA suggesting proximal large vessel occlusion   Ct Cerebral Perfusion W/cm 05/29/2014 There could be a moderate-sized ischemic penumbra in the LEFT hemisphere in this patient with suspected LEFT ICA occlusion. At the time of dictation, the patient is in interventional radiology in preparation for possible thrombolysis.   Cerebral Angiogram 05/29/2014 LT ICA and LT T occlusion in isolated Lt cerebral hemisphere. S/P complete TIC 3 revascularization using 2 passes with Trevoprovue 4 mm x 30 mm retrieval device and 8 mg of superselective intracranial intraarterial Integrelin.  Dg Chest Port 1 View 05/31/2014 Extubation, with low lung volumes but no acute disease. 05/30/2014 Low lung volumes with mild right basilar atelectasis.   Dg Abd Portable 1v 05/29/2014 1. OG tube tip in the proximal body of the stomach. 2. Early/incipient small bowel ileus.   MRI/MRA of the brain Patient refused  CTA head and neck 06/01/14 1. Collected evolution of infarcts involving the left insular cortex and segmental areas of the left anterior MCA distribution. This territory is significantly smaller than the area of ischemia identified on the CT perfusion scan. 2. Persistent patency of the left internal carotid artery. 3.  Slight irregularity of the distal left ICA. This may represent fibromuscular dysplasia. 4. Evidence luxury perfusion within the infarcted territories of the left frontal lobe.  2D Echocardiogram  - Left ventricle: The cavity size was normal. Wall thickness was normal. Systolic function was normal. The estimated ejection fraction was in the range of 55% to 60%. Wall motion was normal; there were no regional wall motion abnormalities. Dopplerparameters are consistent with abnormal left ventricularrelaxation (grade 1 diastolic dysfunction). Impressions: No cardiac source of emboli was indentified.  LE venous doppler - negative for DVT  EKG Sinus rhythm. 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 2) Normal EF 60% 3) No LAA thrombus 4) Trivial MR/AR 5) Normal RV 6) No pericardial effusion 7) No aortic debris  Component     Latest Ref Rng 05/29/2014 05/30/2014 05/30/2014          3:45 AM 12:00 PM  PTT Lupus Anticoagulant     28.0 - 43.0 secs 34.9    PTTLA Confirmation     <8.0 secs NOT APPL    PTTLA 4:1 Mix     28.0 - 43.0 secs NOT APPL    DRVVT     <42.9 secs 35.0    Drvvt confirmation     <1.15 Ratio NOT APPL    dRVVT Incubated 1:1 Mix     <42.9 secs NOT APPL  Lupus Anticoagulant     NOT DETECTED NOT DETECTED    Cholesterol     0 - 200 mg/dL  161 096  Triglycerides     <150 mg/dL  045 (H) 409 (H)  HDL     >39 mg/dL  33 (L) 31 (L)  Total CHOL/HDL Ratio       5.0 5.6  VLDL     0 - 40 mg/dL  43 (H) 45 (H)  LDL (calc)     0 - 99 mg/dL  89 97  Beta-2 Glyco I IgG     <20 G Units 14    Beta-2-Glycoprotein I IgM     <20 M Units 6    Beta-2-Glycoprotein I IgA     <20 A Units 6    Interpretation-F5LEID:      REPORT    Recommendations-F5LEID:      REPORT    Reviewer      REPORT    Interpretation-PTGENE:      REPORT    Recommendations-PTGENE:      REPORT      Reviewer      REPORT    Anticardiolipin IgG     <23 GPL U/mL 3 (L)    Anticardiolipin IgM     <11 MPL U/mL 4 (L)    Anticardiolipin IgA     <22 APL U/mL 10 (L)    c-ANCA Screen     NEGATIVE     p-ANCA Screen     NEGATIVE     Atypical p-ANCA Screen     NEGATIVE     Hemoglobin A1C     <5.7 %  5.6   Mean Plasma Glucose     <117 mg/dL  811   AntiThromb III Func     75 - 120 % 105    Protein C Activity     74 - 151 % 174 (H)    Protein C, Total     70 - 140 % 110    Protein S Activity     60 - 145 % 113    Protein S Ag, Total     58 - 150 % 128    Homocysteine     0.0 - 15.0 umol/L 8.1    ds DNA Ab          C3 Complement     90 - 180 mg/dL     Complement C4, Body Fluid     10 - 40 mg/dL     SSA (Ro) (ENA) Antibody, IgG     <1.0 NEG AI     SSB (La) (ENA) Antibody, IgG     <1.0 NEG AI     Rhuematoid fact SerPl-aCnc     <=14 IU/mL     Anit Nuclear Antibody(ANA)     NEGATIVE     Alpha galactosidase, serum     0.074 - 0.457 U/L     Sickle Cell Screen     NEGATIVE     D-Dimer, Quant     0.00 - 0.48 ug/mL-FEU      Component     Latest Ref Rng 06/02/2014 06/03/2014 06/27/2014            PTT Lupus Anticoagulant     28.0 - 43.0 secs     PTTLA Confirmation     <8.0 secs     PTTLA 4:1 Mix     28.0 - 43.0 secs     DRVVT     <42.9  secs     Drvvt confirmation     <1.15 Ratio     dRVVT Incubated 1:1 Mix     <42.9 secs     Lupus Anticoagulant     NOT DETECTED     Cholesterol     0 - 200 mg/dL     Triglycerides     <409 mg/dL     HDL     >81 mg/dL     Total CHOL/HDL Ratio          VLDL     0 - 40 mg/dL     LDL (calc)     0 - 99 mg/dL     Beta-2 Glyco I IgG     <20 G Units     Beta-2-Glycoprotein I IgM     <20 M Units     Beta-2-Glycoprotein I IgA     <20 A Units     Interpretation-F5LEID:          Recommendations-F5LEID:          Reviewer          Interpretation-PTGENE:          Recommendations-PTGENE:          Reviewer           Anticardiolipin IgG     <23 GPL U/mL     Anticardiolipin IgM     <11 MPL U/mL     Anticardiolipin IgA     <22 APL U/mL     c-ANCA Screen     NEGATIVE NEGATIVE    p-ANCA Screen     NEGATIVE NEGATIVE    Atypical p-ANCA Screen     NEGATIVE NEGATIVE    Hemoglobin A1C     <5.7 %     Mean Plasma Glucose     <117 mg/dL     AntiThromb III Func     75 - 120 %     Protein C Activity     74 - 151 %     Protein C, Total     70 - 140 %     Protein S Activity     60 - 145 %     Protein S Ag, Total     58 - 150 %     Homocysteine     0.0 - 15.0 umol/L     ds DNA Ab      1    C3 Complement     90 - 180 mg/dL 191 (H)    Complement C4, Body Fluid     10 - 40 mg/dL 38    SSA (Ro) (ENA) Antibody, IgG     <1.0 NEG AI <1.0 NEG    SSB (La) (ENA) Antibody, IgG     <1.0 NEG AI <1.0 NEG    Rhuematoid fact SerPl-aCnc     <=14 IU/mL <10    Anit Nuclear Antibody(ANA)     NEGATIVE NEGATIVE    Alpha galactosidase, serum     0.074 - 0.457 U/L 0.262    Sickle Cell Screen     NEGATIVE  NEGATIVE   D-Dimer, Quant     0.00 - 0.48 ug/mL-FEU   0.40    Assessment: As you may recall, she is a 39 y.o. African American obese female with no significant past medical history was admitted on 06/04/14 for left MCA stroke. Pt refused MRI but CT repeat confirmed left MCA stroke. 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 She received mechanical thrombectomy with TICI3 recannulization.Repeat CTA head and neck showed patent vessels. Etiology for stroke not clear, stroke work up including TEE, TTE, CUS, hypercoagulable and autoimmue work up all negative except large PFO with mobile ASA. Risk factor including progestin implant (Nexplanon) use for contraception , obesity, and large PFO with mobile ASA.due to lack of risk factors and young age, PFO closure was considered and Dr. Excell Seltzer will do the procedure 07/09/14.  She has Nexplanon implant, recommend to remove. Continue dural antiplatelet.   Plan:  - Continue aspirin and the Plavix and Crestor for stroke prevention - Recommend to remove Nexplanon for better stroke prevention - Proceed with endovascular PFO closure procedure with Dr. Excell Seltzer - Establish PCP care as well as possible and the follow-up with PCP for stroke risk factor modification - Regular exercises and weight loss - Continue PT/OT/speech - RTC in 2 months  No orders of the defined types were placed in this encounter.    No orders of the defined types were placed in this encounter.    Patient Instructions  - continue ASA and plavix and crestor for stroke prevention - recommend to take off the birth control inplant for better stroke prevention - good luck with Thursday procedure - establish PCP care and follow up with PCP for stroke risk factor modification. - healthy diet, regular exercise - continue PT/OT/speech - follow up in 2 months    Marvel Plan, MD PhD [facility] [address], [address] [place], [place] [address] [phone]

## 2014-07-07 NOTE — Patient Instructions (Addendum)
-   continue ASA and plavix and crestor for stroke prevention - recommend to take off the birth control inplant for better stroke prevention - good luck with Thursday procedure - establish PCP care and follow up with PCP for stroke risk factor modification. - healthy diet, regular exercise - continue PT/OT/speech - follow up in 2 months

## 2014-07-08 ENCOUNTER — Telehealth: Payer: Self-pay | Admitting: *Deleted

## 2014-07-08 NOTE — Telephone Encounter (Signed)
Form,[employer] sent to [facility] and Dr [name] 07-08-14.

## 2014-07-09 ENCOUNTER — Ambulatory Visit (HOSPITAL_COMMUNITY)
Admission: RE | Admit: 2014-07-09 | Discharge: 2014-07-10 | Disposition: A | Discharge status: Home / Self Care | Payer: Managed Care, Other (non HMO) | Source: Ambulatory Visit | Attending: Cardiovascular Disease | Admitting: Cardiovascular Disease

## 2014-07-09 ENCOUNTER — Encounter (HOSPITAL_COMMUNITY): Payer: Self-pay | Admitting: General Practice

## 2014-07-09 ENCOUNTER — Encounter (HOSPITAL_COMMUNITY)
Admission: RE | Disposition: A | Discharge status: Home / Self Care | Payer: Self-pay | Source: Ambulatory Visit | Attending: Cardiovascular Disease

## 2014-07-09 DIAGNOSIS — E669 Obesity, unspecified: Secondary | ICD-10-CM | POA: Diagnosis present

## 2014-07-09 DIAGNOSIS — IMO0002 Reserved for concepts with insufficient information to code with codable children: Secondary | ICD-10-CM

## 2014-07-09 DIAGNOSIS — Z7982 Long term (current) use of aspirin: Secondary | ICD-10-CM | POA: Insufficient documentation

## 2014-07-09 DIAGNOSIS — Q211 Atrial septal defect: Secondary | ICD-10-CM

## 2014-07-09 DIAGNOSIS — E785 Hyperlipidemia, unspecified: Secondary | ICD-10-CM | POA: Diagnosis not present

## 2014-07-09 DIAGNOSIS — Z6841 Body Mass Index (BMI) 40.0 and over, adult: Secondary | ICD-10-CM | POA: Insufficient documentation

## 2014-07-09 DIAGNOSIS — I253 Aneurysm of heart: Secondary | ICD-10-CM | POA: Diagnosis not present

## 2014-07-09 DIAGNOSIS — G8190 Hemiplegia, unspecified affecting unspecified side: Secondary | ICD-10-CM

## 2014-07-09 DIAGNOSIS — Q2112 Patent foramen ovale: Secondary | ICD-10-CM

## 2014-07-09 DIAGNOSIS — I69320 Aphasia following cerebral infarction: Secondary | ICD-10-CM | POA: Diagnosis not present

## 2014-07-09 HISTORY — PX: PATENT FORAMEN OVALE CLOSURE: SHX2181

## 2014-07-09 HISTORY — PX: PATENT FORAMEN OVALE CLOSURE: SHX5483

## 2014-07-09 LAB — POCT ACTIVATED CLOTTING TIME
Activated Clotting Time: 189 seconds
Activated Clotting Time: 226 seconds
Activated Clotting Time: 233 seconds
Activated Clotting Time: 165 seconds

## 2014-07-09 LAB — PREGNANCY, URINE: PREG TEST UR: NEGATIVE

## 2014-07-09 SURGERY — PATENT FORAMEN OVALE CLOSURE
Anesthesia: LOCAL

## 2014-07-09 MED ORDER — CLOPIDOGREL BISULFATE 75 MG PO TABS
ORAL_TABLET | ORAL | Status: AC
  Filled 2014-07-09: qty 1

## 2014-07-09 MED ORDER — MIDAZOLAM HCL 2 MG/2ML IJ SOLN
INTRAMUSCULAR | Status: AC
  Filled 2014-07-09: qty 2

## 2014-07-09 MED ORDER — CEFAZOLIN SODIUM-DEXTROSE 2-3 GM-% IV SOLR
INTRAVENOUS | Status: AC
  Filled 2014-07-09: qty 50

## 2014-07-09 MED ORDER — SODIUM CHLORIDE 0.9 % IJ SOLN
3.0000 mL | Freq: Two times a day (BID) | INTRAMUSCULAR | Status: DC
  Administered 2014-07-09: 22:00:00 3 mL via INTRAVENOUS

## 2014-07-09 MED ORDER — LIDOCAINE HCL (PF) 1 % IJ SOLN
INTRAMUSCULAR | Status: AC
  Filled 2014-07-09: qty 30

## 2014-07-09 MED ORDER — SODIUM CHLORIDE 0.9 % IV SOLN
INTRAVENOUS | Status: DC
  Administered 2014-07-09: 08:00:00 via INTRAVENOUS

## 2014-07-09 MED ORDER — HYDROMORPHONE HCL 1 MG/ML IJ SOLN
INTRAMUSCULAR | Status: AC
  Filled 2014-07-09: qty 1

## 2014-07-09 MED ORDER — HEPARIN SODIUM (PORCINE) 1000 UNIT/ML IJ SOLN
INTRAMUSCULAR | Status: AC
  Filled 2014-07-09: qty 1

## 2014-07-09 MED ORDER — OXYCODONE-ACETAMINOPHEN 5-325 MG PO TABS: 1.0000 | ORAL_TABLET | Freq: Four times a day (QID) | ORAL | Status: DC | PRN

## 2014-07-09 MED ORDER — SODIUM CHLORIDE 0.9 % IV SOLN: 1.0000 mL/kg/h | INTRAVENOUS | Status: AC

## 2014-07-09 MED ORDER — ONDANSETRON HCL 4 MG/2ML IJ SOLN: 4.0000 mg | Freq: Four times a day (QID) | INTRAMUSCULAR | Status: DC | PRN

## 2014-07-09 MED ORDER — SODIUM CHLORIDE 0.9 % IJ SOLN: 3.0000 mL | INTRAMUSCULAR | Status: DC | PRN

## 2014-07-09 MED ORDER — FENTANYL CITRATE 0.05 MG/ML IJ SOLN
INTRAMUSCULAR | Status: AC
  Filled 2014-07-09: qty 2

## 2014-07-09 MED ORDER — CEFAZOLIN SODIUM 1-5 GM-% IV SOLN: 2.0000 g | Freq: Once | INTRAVENOUS | Status: DC

## 2014-07-09 MED ORDER — ASPIRIN 81 MG PO CHEW
CHEWABLE_TABLET | ORAL | Status: AC
  Filled 2014-07-09: qty 1

## 2014-07-09 MED ORDER — SODIUM CHLORIDE 0.9 % IV SOLN: 250.0000 mL | INTRAVENOUS | Status: DC | PRN

## 2014-07-09 MED ORDER — ROSUVASTATIN CALCIUM 5 MG PO TABS
5.0000 mg | ORAL_TABLET | Freq: Every day | ORAL | Status: DC
  Administered 2014-07-09: 5 mg via ORAL
  Filled 2014-07-09 (×2): qty 1

## 2014-07-09 MED ORDER — ACETAMINOPHEN 325 MG PO TABS: 650.0000 mg | ORAL_TABLET | ORAL | Status: DC | PRN

## 2014-07-09 MED ORDER — ASPIRIN 325 MG PO TABS
325.0000 mg | ORAL_TABLET | Freq: Every day | ORAL | Status: DC
  Administered 2014-07-10: 09:00:00 325 mg via ORAL
  Filled 2014-07-09 (×2): qty 1

## 2014-07-09 MED ORDER — SODIUM CHLORIDE 0.9 % IJ SOLN
3.0000 mL | Freq: Two times a day (BID) | INTRAMUSCULAR | Status: DC
Start: 2014-07-09 — End: 2014-07-09

## 2014-07-09 MED ORDER — CLOPIDOGREL BISULFATE 75 MG PO TABS
75.0000 mg | ORAL_TABLET | ORAL | Status: AC
  Administered 2014-07-09: 75 mg via ORAL

## 2014-07-09 MED ORDER — ASPIRIN 81 MG PO CHEW
81.0000 mg | CHEWABLE_TABLET | ORAL | Status: AC
  Administered 2014-07-09: 81 mg via ORAL

## 2014-07-09 MED ORDER — CLOPIDOGREL BISULFATE 75 MG PO TABS
75.0000 mg | ORAL_TABLET | Freq: Every day | ORAL | Status: DC
  Administered 2014-07-10: 09:00:00 75 mg via ORAL
  Filled 2014-07-09: qty 1

## 2014-07-09 MED ORDER — HEPARIN (PORCINE) IN NACL 2-0.9 UNIT/ML-% IJ SOLN
INTRAMUSCULAR | Status: AC
  Filled 2014-07-09: qty 1000

## 2014-07-09 NOTE — H&P (View-Only) (Signed)
 Cardiology Office Note   Date:  06/23/2014   ID:  Gordon L Garlow, DOB 10/08/1975, MRN [id number]  PCP:  OWENS,REBECCA, FNP  Cardiologist:  Kaileb Monsanto, MD    Chief Complaint  Patient presents with  . here to discuss PFO Closure     History of Present Illness: Rebecca Russo is a 38 y.o. female who presents for cardiac evaluation. The patient was in her normal state of good health when she developed sudden onset of right arm and leg paralysis on January 14. The patient was found to have a large left MCA infarct and she underwent mechanical thrombectomy. During the course of her evaluation, there was no clear etiology of her stroke except for the presence of a large PFO and associated atrial septal aneurysm. Other extensive evaluation showed no evidence of a hypercoagulable disorder or any vascular disease. She did not have fever.  The patient presents today to discuss consideration of transcatheter closure of her large PFO. She has residual expressive aphasia. She is here today with her mother and her brother.  The patient has no chest pain, shortness of breath, or leg swelling. She denies any bleeding problems. She has no other complaints today.   Past Medical History  Diagnosis Date  . H/O varicella   . Increased BMI 12/17/08    Past Surgical History  Procedure Laterality Date  . Laparoscopic cholecystectomy  06/20/2012  . Radiology with anesthesia N/A 05/29/2014    Procedure: RADIOLOGY WITH ANESTHESIA;  Surgeon: Sanjeev K Deveshwar, MD;  Location: [facility] OR;  Service: Radiology;  Laterality: N/A;  . Tee without cardioversion N/A 06/03/2014    Procedure: TRANSESOPHAGEAL ECHOCARDIOGRAM (TEE);  Surgeon: Peter C Nishan, MD;  Location: [facility] ENDOSCOPY;  Service: Cardiovascular;  Laterality: N/A;    Current Outpatient Prescriptions  Medication Sig Dispense Refill  . aspirin 325 MG tablet Take 1 tablet (325 mg total) by mouth daily. 90 tablet 3  . rosuvastatin (CRESTOR) 5 MG tablet Take 1  tablet (5 mg total) by mouth daily at 6 PM. 30 tablet 5  . clopidogrel (PLAVIX) 75 MG tablet Take 1 tablet (75 mg total) by mouth daily. 30 tablet 11   No current facility-administered medications for this visit.    Allergies:   Review of patient's allergies indicates no known allergies.   Social History:  The patient  reports that she has never smoked. She does not have any smokeless tobacco history on file. She reports that she does not drink alcohol or use illicit drugs.   Family History:  The patient's  family history includes Hypertension in her father and mother.    ROS:  Please see the history of present illness. The only positive symptom reported his presence of long-standing migraine headaches. All other systems are reviewed and negative.    PHYSICAL EXAM: VS:  BP 126/74 mmHg  Pulse 83  Ht 5' 10" (1.778 m)  Wt 339 lb (153.769 kg)  BMI 48.64 kg/m2  LMP  (LMP Unknown) , BMI Body mass index is 48.64 kg/(m^2). GEN: Well nourished, well developed, pleasant obese woman in no acute distress HEENT: normal Neck: no JVD, carotid bruits, or masses Cardiac: RRR without murmur or gallop                No peripheral edema Respiratory:  clear to auscultation bilaterally, normal work of breathing GI: soft, nontender, nondistended, + BS MS: no deformity or atrophy Skin: warm and dry, no rash Neuro:  Strength and sensation are intact,   expressive aphasia is present Psych: euthymic mood, full affect  EKG:  EKG is not ordered today.  Recent Labs: 05/29/2014: ALT 22 05/31/2014: Magnesium 2.0 06/03/2014: BUN 13; Creatinine 0.79; Hemoglobin 11.5*; Platelets 431*; Potassium 3.8; Sodium 140   Lipid Panel     Component Value Date/Time   CHOL 173 05/30/2014 1200   TRIG 225* 05/30/2014 1200   HDL 31* 05/30/2014 1200   CHOLHDL 5.6 05/30/2014 1200   VLDL 45* 05/30/2014 1200   LDLCALC 97 05/30/2014 1200      Wt Readings from Last 3 Encounters:  06/22/14 339 lb (153.769 kg)  05/31/14 346  lb (156.945 kg)  06/17/12 345 lb (156.491 kg)     Cardiac Studies Reviewed: 2D Echo 06-01-2014: Study Conclusions  - Left ventricle: The cavity size was normal. Wall thickness was normal. Systolic function was normal. The estimated ejection fraction was in the range of 55% to 60%. Wall motion was normal; there were no regional wall motion abnormalities. Doppler parameters are consistent with abnormal left ventricular relaxation (grade 1 diastolic dysfunction).  Impressions:  - No cardiac source of emboli was indentified.  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 This patient underwent extensive stroke workup and there was no other potential causative mechanism identified. I have personally reviewed the patients TEE and echo images. She has a very large PFO with spontaneous right to left flow and an associated atrial septal aneurysm. Patients with this type of PFO anatomy have the highest risk of recurrent stroke. I have reviewed clinical trial data of PFO closure with the patient and her family members today. We have discussed the fact that there is some degree of equipoise between device closure and medical therapy for secondary stroke prevention. However, there is a clear trend towards benefit in younger patients and those with atrial septal aneurysm. After review of options, the patient would like to proceed with transcatheter closure. I think this is appropriate.  I have reviewed the specific risks, indications, and alternatives to transcatheter PFO closure with the patient. Specific risks include bleeding, infection, stroke, myocardial infarction, cardiac perforation, device embolization, late device erosion,  emergency cardiac surgery, and death. She understands these risks are low and would like to proceed. All questions were answered. The patient will be started on clopidogrel 75 mg daily and we will schedule the procedure at the next available time.   Current medicines are reviewed with the patient today.  The patient does not have concerns regarding medicines.  The following changes have been made:   Start plavix 75 mg daily. Continue aspirin.  Labs/ tests ordered today include:   Orders Placed This Encounter  Procedures  . Basic Metabolic Panel (BMET)  . CBC  . INR/PT   Signed, Bryann Gentz, MD  06/23/2014 2:45 PM    [facility] [address], [place], [place]  [address] Phone: [phone]; Fax: [phone]   

## 2014-07-09 NOTE — Progress Notes (Signed)
13:05 R. FEMORAL VENOUS SHEATHS X2 PULLED WITHOUT COMPLICATION.  DIRECT PRESSURE X 25 MINUTES.  PRESSURE BANDAGE APPLIIED WITH PT. INSTRUCTIONS AND UNDERSTANDING.  L. Damier Disano- RCIS

## 2014-07-09 NOTE — Interval H&P Note (Signed)
History and Physical Interval Note:  07/09/2014 9:51 AM  Rebecca Russo  has presented today for surgery, with the diagnosis of PFO  The various methods of treatment have been discussed with the patient and family. After consideration of risks, benefits and other options for treatment, the patient has consented to  Procedure(s): PATENT FORAMEN OVALE CLOSURE (N/A) as a surgical intervention .  The patient's history has been reviewed, patient examined, no change in status, stable for surgery.  I have reviewed the patient's chart and labs.  Questions were answered to the patient's satisfaction.     Tonny Bollman

## 2014-07-09 NOTE — CV Procedure (Signed)
   Cardiac Catheterization Procedure Note  Name: Rebecca Russo MRN: [id number] DOB: [birth year]  Procedure: Intracardiac echo, transcatheter PFO closure  Indication: PFO with atrial septal aneurysm and stroke   Procedural details: The right groin was prepped, draped, and anesthetized with 1% lidocaine. Using modified Seldinger technique, a 6 French sheath was introduced into the right femoral vein and a 9 French sheath was introduced into the ipsilateral right femoral vein. The patient was systemically anticoagulated with unfractionated heparin. A therapeutic ACT was achieved. An intracardiac echo probe was introduced and imaging confirmed a large PFO with atrial septal aneurysm. The defect was then crossed with a multipurpose catheter and angled Glidewire. The multipurpose catheter was advanced into the left upper pulmonary vein and exchanged out for an Amplatz wire. Intracardiac echo was performed and the opening of the defect was measured at 6 mm in maximal diameter. We felt this would be best closed with a 25 mm cribriform atrial septal occluder device. The device was prepped under normal sterile conditions in a bowl of saline. An 8 French introducer sheath was advanced across the interatrial septum into the mouth of the left upper pulmonary vein. The device was advanced through the delivery sheath and deployed with the left atrial disc the left atrium and then pulled back to the septum and the right atrial disc was deployed. Appropriate position was confirmed by both fluoroscopy and intracardiac echo. The device was released and imaging confirmed appropriate position with no residual color flow across the defect. The patient tolerated the entire procedure well. There were no immediate procedural complications. The patient was transferred to the post catheterization recovery area for further monitoring.  Final Conclusions:  Successful transcatheter closure of a large PFO with associated atrial septal  aneurysm  Recommendations: Overnight admission, limited 2-D echocardiogram in the morning and chest x-ray in the morning. Anticipate discharge tomorrow. The patient should be continued on dual antiplatelet therapy with aspirin and Plavix for 6 months.  Tonny Bollman 07/09/2014, 12:31 PM

## 2014-07-10 ENCOUNTER — Ambulatory Visit (HOSPITAL_COMMUNITY): Payer: Managed Care, Other (non HMO)

## 2014-07-10 DIAGNOSIS — I69320 Aphasia following cerebral infarction: Secondary | ICD-10-CM

## 2014-07-10 DIAGNOSIS — Q211 Atrial septal defect: Secondary | ICD-10-CM | POA: Diagnosis not present

## 2014-07-10 LAB — CBC
HCT: 30.1 % — ABNORMAL LOW (ref 36.0–46.0)
Hemoglobin: 9.8 g/dL — ABNORMAL LOW (ref 12.0–15.0)
MCH: 29.7 pg (ref 26.0–34.0)
MCHC: 32.6 g/dL (ref 30.0–36.0)
MCV: 91.2 fL (ref 78.0–100.0)
Platelets: 362 10*3/uL (ref 150–400)
RBC: 3.3 MIL/uL — AB (ref 3.87–5.11)
RDW: 14.3 % (ref 11.5–15.5)
WBC: 10.6 10*3/uL — ABNORMAL HIGH (ref 4.0–10.5)

## 2014-07-10 LAB — BASIC METABOLIC PANEL
ANION GAP: 7 (ref 5–15)
BUN: <5 mg/dL — ABNORMAL LOW (ref 6–23)
CHLORIDE: 110 mmol/L (ref 96–112)
CO2: 20 mmol/L (ref 19–32)
Calcium: 8.3 mg/dL — ABNORMAL LOW (ref 8.4–10.5)
Creatinine, Ser: 0.72 mg/dL (ref 0.50–1.10)
GFR calc Af Amer: >90 mL/min (ref >90)
GFR calc non Af Amer: >90 mL/min (ref >90)
Glucose, Bld: 87 mg/dL (ref 70–99)
POTASSIUM: 3.3 mmol/L — AB (ref 3.5–5.1)
SODIUM: 137 mmol/L (ref 135–145)

## 2014-07-10 MED ORDER — POTASSIUM CHLORIDE CRYS ER 20 MEQ PO TBCR
40.0000 meq | EXTENDED_RELEASE_TABLET | Freq: Once | ORAL | Status: AC
  Administered 2014-07-10: 09:00:00 40 meq via ORAL
  Filled 2014-07-10: qty 2

## 2014-07-10 NOTE — Progress Notes (Signed)
  Echocardiogram 2D Echocardiogram has been performed.  Rebecca Russo FRANCES 07/10/2014, 9:58 AM

## 2014-07-10 NOTE — Telephone Encounter (Signed)
Form received on 07/08/14, and completed on 07/10/14.

## 2014-07-10 NOTE — Progress Notes (Signed)
    Subjective: No complaints  Objective: Vital signs in last 24 hours: Temp:  [98 F (36.7 C)-98.7 F (37.1 C)] 98 F (36.7 C) (02/26 0420) Pulse Rate:  [64-101] 75 (02/26 0420) Resp:  [0-19] 18 (02/26 0420) BP: (95-118)/(52-76) 116/65 mmHg (02/26 0420) SpO2:  [98 %-100 %] 99 % (02/26 0420) Weight:  [330 lb 11 oz (150 kg)] 330 lb 11 oz (150 kg) (02/26 0420) Last BM Date: 07/09/14  Intake/Output from previous day: 02/25 0701 - 02/26 0700 In: 1808.7 [P.O.:640; I.V.:1168.7] Out: 800 [Urine:800] Intake/Output this shift:    Medications Current Facility-Administered Medications  Medication Dose Route Frequency Provider Last Rate Last Dose  . 0.9 %  sodium chloride infusion  250 mL Intravenous PRN Micheline Chapman, MD      . acetaminophen (TYLENOL) tablet 650 mg  650 mg Oral Q4H PRN Micheline Chapman, MD      . aspirin tablet 325 mg  325 mg Oral Daily Micheline Chapman, MD   0 mg at 07/09/14 1800  . clopidogrel (PLAVIX) tablet 75 mg  75 mg Oral Daily Micheline Chapman, MD   0 mg at 07/09/14 1800  . ondansetron (ZOFRAN) injection 4 mg  4 mg Intravenous Q6H PRN Micheline Chapman, MD      . oxyCODONE-acetaminophen (PERCOCET/ROXICET) 5-325 MG per tablet 1 tablet  1 tablet Oral Q6H PRN Micheline Chapman, MD      . rosuvastatin (CRESTOR) tablet 5 mg  5 mg Oral q1800 Micheline Chapman, MD   5 mg at 07/09/14 1851  . sodium chloride 0.9 % injection 3 mL  3 mL Intravenous Q12H Micheline Chapman, MD   3 mL at 07/09/14 2139  . sodium chloride 0.9 % injection 3 mL  3 mL Intravenous PRN Micheline Chapman, MD        PE: General appearance: alert, cooperative and no distress Lungs: clear to auscultation bilaterally Heart: regular rate and rhythm, S1, S2 normal, no murmur, click, rub or gallop Extremities: No LEE Pulses: 2+ and symmetric Skin: warm and dry.  Right groin:  nontender, no ecchymosis or hematoma Neurologic: Grossly normal.  Expressive aphasia  Lab Results:   Recent Labs   07/10/14 0400  WBC 10.6*  HGB 9.8*  HCT 30.1*  PLT 362   BMET  Recent Labs  07/10/14 0400  NA 137  K 3.3*  CL 110  CO2 20  GLUCOSE 87  BUN <5*  CREATININE 0.72  CALCIUM 8.3*     Assessment/Plan   Active Problems:   Aphasia complicating stroke   Hemiplegia affecting dominant side   Morbid obesity   PFO with atrial septal aneurysm   Hyperlipidemia LDL goal <70   Hypokalemia   SP Successful transcatheter closure of a large PFO with associated atrial septal aneurysm.  Limited echo and CXR this morning.  ASA, plavix for six months.  Replace K.  DC home after above procedures.  BP and HR stable/controlled.      Serenah Mill PA-C 07/10/2014 7:09 AM

## 2014-07-10 NOTE — Discharge Summary (Signed)
Physician Discharge Summary     Cardiologist:  Excell Seltzer Patient ID: Rebecca Russo MRN: [id number] DOB/AGE: 1976-05-02 39 y.o.  Admit date: 07/09/2014 Discharge date: 07/10/2014  Admission Diagnoses:  PFO  Discharge Diagnoses:  Active Problems:   Aphasia complicating stroke   Hemiplegia affecting dominant side   Morbid obesity   PFO with atrial septal aneurysm   Hyperlipidemia LDL goal <70   Discharged Condition: stable  Hospital Course:   Rebecca Russo is a 39 y.o. female who presents for cardiac evaluation. The patient was in her normal state of good health when she developed sudden onset of right arm and leg paralysis on January 14. The patient was found to have a large left MCA infarct and she underwent mechanical thrombectomy. During the course of her evaluation, there was no clear etiology of her stroke except for the presence of a large PFO and associated atrial septal aneurysm. Other extensive evaluation showed no evidence of a hypercoagulable disorder or any vascular disease. She did not have fever.  She was seen on 06/22/14 by Dr. Excell Seltzer to discuss consideration of transcatheter closure of her large PFO. She has residual expressive aphasia.  At that time she had no chest pain, shortness of breath, or leg swelling. She denies any bleeding problems.   She was admitted on 07/09/14 and underwent successful transcatheter PFO closure with associated atrial septal aneurysm. She will be discharged on ASA and plavix.  Post procedure CXR revealed no cardiopulmonary disease.  She also had a post procedure Echo which revealed:  Ejection fraction of 50-55%.  The PFO closure device was well seated with no obvious flow across the atrial septum.   Right groin was stable.  The patient was seen by Dr. Katrinka Blazing who felt she was stable for DC home.     Consults: None  Significant Diagnostic Studies:  Study Conclusions  - Left ventricle: The cavity size was normal. Systolic function was normal.  The estimated ejection fraction was in the range of 50% to 55%. - Mitral valve: There was mild regurgitation. - Left atrium: Closure device is well seated with no obvious flow across atrial septum   CHEST 2 VIEW  COMPARISON: PA and lateral chest x-ray of July 04, 2014  FINDINGS: The lungs are well-expanded and clear. The heart and mediastinal structures are normal. There is no pulmonary vascular congestion. There is no pleural effusion or pneumothorax. The bony thorax is unremarkable.  IMPRESSION: There is no active cardiopulmonary disease.   Cardiac Catheterization Procedure Note  Name: Rebecca Russo MRN: [id number] DOB: March 18, 1976  Procedure: Intracardiac echo, transcatheter PFO closure  Indication: PFO with atrial septal aneurysm and stroke  Procedural details: The right groin was prepped, draped, and anesthetized with 1% lidocaine. Using modified Seldinger technique, a 6 French sheath was introduced into the right femoral vein and a 9 French sheath was introduced into the ipsilateral right femoral vein. The patient was systemically anticoagulated with unfractionated heparin. A therapeutic ACT was achieved. An intracardiac echo probe was introduced and imaging confirmed a large PFO with atrial septal aneurysm. The defect was then crossed with a multipurpose catheter and angled Glidewire. The multipurpose catheter was advanced into the left upper pulmonary vein and exchanged out for an Amplatz wire. Intracardiac echo was performed and the opening of the defect was measured at 6 mm in maximal diameter. We felt this would be best closed with a 25 mm cribriform atrial septal occluder device. The device was prepped under normal sterile conditions in  a bowl of saline. An 8 French introducer sheath was advanced across the interatrial septum into the mouth of the left upper pulmonary vein. The device was advanced through the delivery sheath and deployed with the left  atrial disc the left atrium and then pulled back to the septum and the right atrial disc was deployed. Appropriate position was confirmed by both fluoroscopy and intracardiac echo. The device was released and imaging confirmed appropriate position with no residual color flow across the defect. The patient tolerated the entire procedure well. There were no immediate procedural complications. The patient was transferred to the post catheterization recovery area for further monitoring.  Final Conclusions: Successful transcatheter closure of a large PFO with associated atrial septal aneurysm  Recommendations: Overnight admission, limited 2-D echocardiogram in the morning and chest x-ray in the morning. Anticipate discharge tomorrow. The patient should be continued on dual antiplatelet therapy with aspirin and Plavix for 6 months.  Tonny Bollman 07/09/2014, 12:31 PM  Treatments:  See above  Discharge Exam: Blood pressure 119/67, pulse 66, temperature 98.2 F (36.8 C), temperature source Oral, resp. rate 18, height 5\' 10"  (1.778 m), weight 330 lb 11 oz (150 kg), SpO2 99 %.   Disposition: 01-Home or Self Care     Medication List    ASK your doctor about these medications        aspirin 325 MG tablet  Take 1 tablet (325 mg total) by mouth daily.     clopidogrel 75 MG tablet  Commonly known as:  PLAVIX  Take 1 tablet (75 mg total) by mouth daily.     oxyCODONE-acetaminophen 5-325 MG per tablet  Commonly known as:  PERCOCET  Take 1 tablet by mouth every 6 (six) hours as needed for moderate pain.     rosuvastatin 5 MG tablet  Commonly known as:  CRESTOR  Take 1 tablet (5 mg total) by mouth daily at 6 PM.        Greater than 30 minutes was spent completing the patient's discharge.    SignedWilburt Finlay, PAC 07/10/2014, 9:47 AM

## 2014-07-10 NOTE — Progress Notes (Signed)
Echo images personally reviewed. Device looks good in proper position and no effusion. Ok to d/c home. thx  Tonny Bollman 07/10/2014 10:51 AM

## 2014-07-10 NOTE — Telephone Encounter (Signed)
Form,[employer] received,completed by Dr [name] and Andreas Blower faxed patient copy at front desk 07-10-14.

## 2014-07-13 DIAGNOSIS — Z0289 Encounter for other administrative examinations: Secondary | ICD-10-CM

## 2014-07-15 ENCOUNTER — Telehealth: Payer: Self-pay | Admitting: Cardiovascular Disease

## 2014-07-15 NOTE — Telephone Encounter (Signed)
New problem   Pt's mom calling because she stated pt hasn't been able to sleep since her heart procedure last week. Please advise pt's mom.

## 2014-07-15 NOTE — Telephone Encounter (Signed)
Agree with plan. Ok to start exercising again at one week from her PFO closure.  Tonny Bollman 07/15/2014 4:37 PM

## 2014-07-15 NOTE — Telephone Encounter (Signed)
Left message for Rebecca Russo on mobile phone [phone]) OK to start exercising again one week after the procedure date.  Requested she call back if further questions or concerns.

## 2014-07-15 NOTE — Telephone Encounter (Signed)
Per mother - pt has not been sleeping since before her PFO closure.  Initially they thought it was because of anxiety but it has not gotten any better.  Pt has not tried anything to help her sleep at this point.  Advised OK to try OTC sleep aids and if this doesn't help she should see her PCP.  She is also wanting to when she can start working out again but c/o site soreness.  Advised to wait to return to exercise at least until soreness is better or she is seen in the office and given clearance.  Will forward to Dr Excell Seltzer for his knowledge and any further recommendations.

## 2014-07-21 ENCOUNTER — Encounter: Payer: Self-pay | Admitting: Occupational Therapy

## 2014-07-21 ENCOUNTER — Ambulatory Visit: Payer: Managed Care, Other (non HMO) | Attending: Neurology | Admitting: Speech Pathology

## 2014-07-21 ENCOUNTER — Ambulatory Visit: Payer: Managed Care, Other (non HMO) | Admitting: Occupational Therapy

## 2014-07-21 DIAGNOSIS — I69320 Aphasia following cerebral infarction: Secondary | ICD-10-CM | POA: Diagnosis not present

## 2014-07-21 DIAGNOSIS — R449 Unspecified symptoms and signs involving general sensations and perceptions: Secondary | ICD-10-CM

## 2014-07-21 DIAGNOSIS — I69390 Apraxia following cerebral infarction: Secondary | ICD-10-CM | POA: Diagnosis not present

## 2014-07-21 DIAGNOSIS — I69359 Hemiplegia and hemiparesis following cerebral infarction affecting unspecified side: Secondary | ICD-10-CM

## 2014-07-21 DIAGNOSIS — R29898 Other symptoms and signs involving the musculoskeletal system: Secondary | ICD-10-CM

## 2014-07-21 DIAGNOSIS — R4189 Other symptoms and signs involving cognitive functions and awareness: Secondary | ICD-10-CM

## 2014-07-21 DIAGNOSIS — IMO0002 Reserved for concepts with insufficient information to code with codable children: Secondary | ICD-10-CM

## 2014-07-21 DIAGNOSIS — I69391 Dysphagia following cerebral infarction: Secondary | ICD-10-CM | POA: Diagnosis not present

## 2014-07-21 NOTE — Patient Instructions (Signed)
Homework provided 

## 2014-07-21 NOTE — Patient Instructions (Addendum)
Pt was instructed in HEP for R hand coordination ex's and in hand manipulation to include: Picking up coins; stacking coins; placing coins in coin bank one at a time; Signing her name; handwriting and eventual typing on a computer to simulate work tasks; dealing cards using thumb; flipping cards as fast as she can one at a time; tossing ball & catching in right hand; tossing ball between right and left hands. Pt was with noted moderate difficulty with above mentioned exercises and was noted to compensate with shoulder hike and occasionally attempted to use left hand. She was able to correct w/ vc's. The above mentioned HEP was performed in clinic and a handout was issued.   Pt was able to sign her name x5 times w/ 100% legibility while staying in the lines on paper today. Pt states that this appears to be her baseline signature and stated that it was "Big then small before, it's not anymore" "It's good". Pt has met STG #2.

## 2014-07-21 NOTE — Therapy (Signed)
[facility] [address] [place], [place], [address] Phone: [phone]   Fax:  [phone]  Speech Language Pathology Treatment  Patient Details  Name: Rebecca Russo MRN: [id number] Date of Birth: December 29, 1975 Referring Provider:  Marvel Plan, MD  Encounter Date: 07/21/2014      End of Session - 07/21/14 1317    Visit Number 7   Number of Visits 24   Date for SLP Re-Evaluation 08/04/14   SLP Start Time 1230   SLP Stop Time  1318   SLP Time Calculation (min) 48 min   Activity Tolerance Patient tolerated treatment well      Past Medical History  Diagnosis Date  . H/O varicella   . Increased BMI 12/17/08  . Heart defect   . Stroke 05/29/2014    "weaker in right arm and speech problems since" (07/09/2014)    Past Surgical History  Procedure Laterality Date  . Laparoscopic cholecystectomy  06/20/2012  . Radiology with anesthesia N/A 05/29/2014    Procedure: RADIOLOGY WITH ANESTHESIA;  Surgeon: Oneal Grout, MD;  [facility]: [facility] OR;  Service: Radiology;  Laterality: N/A;  . Tee without cardioversion N/A 06/03/2014    Procedure: TRANSESOPHAGEAL ECHOCARDIOGRAM (TEE);  Surgeon: Wendall Stade, MD;  Location: [facility] ENDOSCOPY;  Service: Cardiovascular;  Laterality: N/A;  . Patent foramen ovale closure  07/09/2014  . 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  Reading comprehension with phrases/sentences 100%.   Assessment / Recommendations / Plan   Plan Continue with current plan of care   Progression Toward Goals   Progression toward goals Progressing toward goals          SLP Education - 07/21/14 1316    Education provided Yes   Education Details Compensations for aphasia   Person(s) Educated Patient   Methods Explanation;Demonstration;Verbal cues   Comprehension Verbalized understanding;Returned demonstration          SLP Short Term Goals - 07/21/14 1318    SLP SHORT TERM GOAL #1   Title Pt will perform simple convergent and divergent naming tasks with 85% accuracy and occassional minimal assistance (due 07/10/14)   Time 2   Period Weeks   Status Achieved   SLP SHORT TERM GOAL #2   Title Pt will demonstrate auditory comprehension of simple conversation and questions with 85% accuracy and occassional minimal assistance (07/10/14)   Time 2   Period Weeks   Status Achieved   SLP SHORT  TERM GOAL #3   Title Pt will id object to phrase/sentence description with 85% accuracy and rare minimal assistance (07/10/14)   Time 2   Period Weeks   Status On-going   SLP SHORT TERM GOAL #4   Title ID object/picture to written description f:4 with 80% acccuracy and rare minimal assistance (07/10/14)   Time 2   Period Weeks   Status Achieved          SLP Long Term Goals - 07/21/14 1319    SLP LONG TERM GOAL #1   Title Demonsrate compensations for aphasia during simple conversation 80% of opportunities and occassional minimal  assistance (08/04/14)   Time 6   Period Weeks   Status On-going   SLP LONG TERM GOAL #2   Title Participate in simple covnersation verbally with 80% accuracy and occassional minimal assistance (08/04/14)   Time 6   Period Weeks   Status On-going   SLP LONG TERM GOAL #3   Title Comprehend simple 3-4 sentence paragraph with 80% accuracy and occassional min A. (08/04/14)   Time 6   Period Weeks   Status On-going   SLP LONG TERM GOAL #4   Title Perform simple functional written expression tasks with 80% accuracy and occassional minimal A (08/04/14)   Time 6   Period Weeks   Status On-going          Plan - 07/21/14 1318    Clinical Impression Statement Pt cont with verbal expression deficits, and recptive language deficits hindering independence and possibility to return to work.   Speech Therapy Frequency 3x / week   Treatment/Interventions Language facilitation;Cueing hierarchy;Functional tasks;Patient/family education;Compensatory strategies;SLP instruction and feedback   Potential to Achieve Goals Good   Potential Considerations Severity of impairments        Problem List Patient Active Problem List   Diagnosis Date Noted  . Morbid obesity 06/04/2014  . Dysphagia, pharyngoesophageal phase 06/04/2014  . PFO with atrial septal aneurysm 06/04/2014  . Hyperlipidemia LDL goal <70 06/04/2014  . Ischemic brain damage   . Acute respiratory failure with hypoxia   . CVA (cerebral vascular accident) 05/29/2014  . Aphasia complicating stroke 05/29/2014  . Hemiplegia affecting dominant side 05/29/2014  . CONSTIPATION 03/09/2008  . IRRITABLE BOWEL SYNDROME 03/09/2008    Maricruz Lucero, Radene Journey, SLP 07/21/2014, 2:58 PM  [facility] [address] [place], [place], [address] Phone: [phone]   Fax:  [phone]

## 2014-07-21 NOTE — Therapy (Signed)
[facility] [address] [place], [place], [address] Phone: [phone]   Fax:  [phone]  Occupational Therapy Treatment  Patient Details  Name: Rebecca Russo  Date of visit: 07/21/14 11:48am-12:33am 45 minutes MRN: [id number] Date of Birth: 1976/01/02 Referring Provider:  Rosalin Hawking, MD  Encounter Date: 07/21/2014      OT End of Session - 07/21/14 1235    Visit Number 2   Number of Visits 16   Date for OT Re-Evaluation 07/27/14   OT Start Time 1148   OT Stop Time 1233   OT Time Calculation (min) 45 min   Activity Tolerance Patient tolerated treatment well   Behavior During Therapy [facility] for tasks assessed/performed      Past Medical History  Diagnosis Date  . H/O varicella   . Increased BMI 12/17/08  . Heart defect   . Stroke 05/29/2014    "weaker in right arm and speech problems since" (07/09/2014)    Past Surgical History  Procedure Laterality Date  . Laparoscopic cholecystectomy  06/20/2012  . Radiology with anesthesia N/A 05/29/2014    Procedure: RADIOLOGY WITH ANESTHESIA;  Surgeon: Rob Hickman, MD;  Location: [facility];  Service: Radiology;  Laterality: N/A;  . Tee without cardioversion N/A 06/03/2014    Procedure: TRANSESOPHAGEAL ECHOCARDIOGRAM (TEE);  Surgeon: Josue Hector, MD;  Location: [facility] ENDOSCOPY;  Service: Cardiovascular;  Laterality: N/A;  . Patent foramen ovale closure  07/09/2014  . Patent foramen ovale closure N/A 07/09/2014    Procedure: PATENT FORAMEN OVALE CLOSURE;  Surgeon: Blane Ohara, MD;  Location: [facility] CATH LAB;  Service: Cardiovascular;  Laterality: N/A;    LMP  (LMP Unknown)  Visit Diagnosis:  Hemiparesis affecting dominant side as late effect of cerebrovascular accident  Sensory deficit, right  Lack of coordination due to stroke  Decreased grip strength of right hand      Subjective Assessment - 07/21/14 1152    Symptoms Pt w/ c/o decreased fine motor control and  coordination of right dominant UE   Pertinent History see snapshot, CVA, PFO   Limitations Aphasia, expressive>receptive   Patient Stated Goals Use my right hand!   Currently in Pain? No/denies   Pain Score 0-No pain   Multiple Pain Sites No                 OT Treatments/Exercises (OP) - 07/21/14 0001    Fine Motor Coordination   Fine Motor Coordination In hand manipuation training;Small Pegboard;Flipping cards;Dealing card with thumb;Picking up coins;Manipulating coins;Stacking coins;Tossing ball  Pt performed above mentioned ex's w/ Right hand   In Hand Manipulation Training Holding 5 coins at a time and moving to finger tips to place in coin bank   R hand, moderate difficulty noted.   Small Pegboard Right hand to follow small pegboard pattern; frequent dropping of pegs and moderate difficulty noted.   Flipping cards Right hand w/ min difficulty noted   Dealing card with thumb Right hand w/ moderate difficulty noted   Picking up coins Right hand w/ moderate difficuly noted and increased time.   Manipulating coins Right hand   Stacking coins Right hand   Tossing ball Right hand w/ min-moderate difficulty noted.   Other Fine Motor Exercises Handout issued and HEP reviewed for above mentioned ex's; pt verbalized understanding and returned demonstration in clinic today      Pt was instructed in HEP for R hand coordination ex's and in hand manipulation to include: Picking up  coins; stacking coins; placing coins in coin bank one at a time; Signing her name; handwriting and eventual typing on a computer to simulate work tasks; dealing cards using thumb; flipping cards as fast as she can one at a time; tossing ball & catching in right hand; tossing ball between right and left hands. Pt was with noted moderate difficulty with above mentioned exercises and was noted to compensate with shoulder hike and occasionally attempted to use left hand. She was able to correct w/ vc's. The above  mentioned HEP was performed in clinic and a handout was issued.   Pt was able to sign her name x5 times w/ 100% legibility while staying in the lines on paper today. Pt states that this appears to be her baseline signature and stated that it was "Big then small before, it's not anymore" "It's good". Pt has met STG #2.          OT Education - 07/21/14 1226    Education provided Yes   Education Details HEP; coordination and fine motor ex's R hand   Person(s) Educated Patient   Methods Explanation;Demonstration;Handout;Verbal cues   Comprehension Verbalized understanding;Returned demonstration          OT Short Term Goals - 07/21/14 1238    OT SHORT TERM GOAL #1   Title Patient will utilize her cell phone to call a family member / friend  using her right hand to "dial"   Time 4   Period Weeks   Status On-going   OT SHORT TERM GOAL #2   Title Patient will legibly write at the single word level using her right hand with AE as needed   Time 4   Period Weeks   Status Achieved   OT SHORT TERM GOAL #3   Title Patient will demonstrate beginning keyboarding skills to type incorporating bilateral upper extremities   Time 4   Period Weeks   Status On-going   OT SHORT TERM GOAL #4   Title Patient will demonstrate adequate strength and coordination in right hand to fasten seat belt in car.    Time 4   Period Weeks   Status On-going           OT Long Term Goals - 07/21/14 1239    OT LONG TERM GOAL #1   Title Patient will effectively don/ doff earrings or necklace using both hands   Time 8   Period Weeks   Status On-going   OT LONG TERM GOAL #2   Title Patient will legibly write at the sentence level using her right hand with AE as needed   Time 8   Period Weeks   Status On-going   OT LONG TERM GOAL #3   Title Patient will apply makeup as for church using bilateral upper extremities, with right hand as dominant.   Time 8   Period Weeks   Status On-going   OT LONG TERM  GOAL #4   Title Patient will demonstrate adequate coordination with keyboarding skills to send simple email to colleague or friend using bilateral upper extremities   Time 8   Period Weeks   Status On-going               Plan - 07/21/14 1235    Clinical Impression Statement Pt is a 39 y/o RHD female, whom was working full time prior to her stroke. She demonstrates decreased sensibility, strength, fine motor control and overall functional use of her dominant UE.   Pt  will benefit from skilled therapeutic intervention in order to improve on the following deficits (Retired) Decreased cognition;Decreased coordination;Decreased strength;Impaired UE functional use;Impaired sensation   Rehab Potential Excellent   Clinical Impairments Affecting Rehab Potential expressive aphasia   OT Frequency 2x / week   OT Duration 8 weeks   OT Treatment/Interventions Self-care/ADL training;Therapeutic exercise;Neuromuscular education;DME and/or AE instruction;Therapeutic exercises;Therapeutic activities;Cognitive remediation/compensation;Patient/family education   Plan Review HEP for coordination and fine motor control; RUE functional use and initiate home activity program to integrate R UE into daily life skills.   Consulted and Agree with Plan of Care Patient;Family member/caregiver   Family Member Consulted Mom        Problem List Patient Active Problem List   Diagnosis Date Noted  . Morbid obesity 06/04/2014  . Dysphagia, pharyngoesophageal phase 06/04/2014  . PFO with atrial septal aneurysm 06/04/2014  . Hyperlipidemia LDL goal <70 06/04/2014  . Ischemic brain damage   . Acute respiratory failure with hypoxia   . CVA (cerebral vascular accident) 05/29/2014  . Aphasia complicating stroke 76/16/0737  . Hemiplegia affecting dominant side 05/29/2014  . CONSTIPATION 03/09/2008  . IRRITABLE BOWEL SYNDROME 03/09/2008    Percell Miller Ardath Sax, OTR/L 07/21/2014, 12:44 PM  [facility] [address] [place], [place], [address] Phone: [phone]   Fax:  [phone]

## 2014-07-23 ENCOUNTER — Encounter: Payer: Self-pay | Admitting: Occupational Therapy

## 2014-07-23 ENCOUNTER — Ambulatory Visit: Payer: Managed Care, Other (non HMO)

## 2014-07-23 ENCOUNTER — Ambulatory Visit: Payer: Managed Care, Other (non HMO) | Admitting: Occupational Therapy

## 2014-07-23 DIAGNOSIS — R29898 Other symptoms and signs involving the musculoskeletal system: Secondary | ICD-10-CM

## 2014-07-23 DIAGNOSIS — I69320 Aphasia following cerebral infarction: Secondary | ICD-10-CM | POA: Diagnosis not present

## 2014-07-23 NOTE — Therapy (Signed)
[facility] [address] [place], [place], [address] Phone: [phone]   Fax:  [phone]  Occupational Therapy Treatment  Patient Details  Name: Rebecca Russo MRN: [id number] Date of Birth: Aug 09, 1975 Referring Provider:  Marvel Plan, MD  Encounter Date: 07/23/2014      OT End of Session - 07/23/14 1525    Visit Number 3   Number of Visits 16   Date for OT Re-Evaluation 08/20/14   OT Start Time 1403   OT Stop Time 1445   OT Time Calculation (min) 42 min   Activity Tolerance Patient tolerated treatment well      Past Medical History  Diagnosis Date  . H/O varicella   . Increased BMI 12/17/08  . Heart defect   . Stroke 05/29/2014    "weaker in right arm and speech problems since" (07/09/2014)    Past Surgical History  Procedure Laterality Date  . Laparoscopic cholecystectomy  06/20/2012  . Radiology with anesthesia N/A 05/29/2014    Procedure: RADIOLOGY WITH ANESTHESIA;  Surgeon: Oneal Grout, MD;  Location: [facility] OR;  Service: Radiology;  Laterality: N/A;  . Tee without cardioversion N/A 06/03/2014    Procedure: TRANSESOPHAGEAL ECHOCARDIOGRAM (TEE);  Surgeon: Wendall Stade, MD;  Location: [facility] ENDOSCOPY;  Service: Cardiovascular;  Laterality: N/A;  . Patent foramen ovale closure  07/09/2014  . Patent foramen ovale closure N/A 07/09/2014    Procedure: PATENT FORAMEN OVALE CLOSURE;  Surgeon: Micheline Chapman, MD;  Location: [facility] CATH LAB;  Service: Cardiovascular;  Laterality: N/A;    There were no vitals filed for this visit.  Visit Diagnosis:  Decreased grip strength of right hand      Subjective Assessment - 07/23/14 1410    Symptoms Things are going pretty good   Pertinent History see snapshot, CVA, PFO   Limitations Aphasia, expressive>receptive   Patient Stated Goals Use my right hand!   Currently in Pain? No/denies                    OT Treatments/Exercises (OP) - 07/23/14 0001    Exercises   Exercises Hand   Hand Exercises   Theraputty Flatten;Roll;Grip;Pinch;Locate Pegs  Exercises with green; 20 pegs in green   Hand Gripper with Small Beads 55 pounds of resistance with one inch blocks. Min dropping.   Other Hand Exercises Pt issued HEP with green theraputty. Pt able to return demonstrate after modeling paired with pictures and simple verbal instruction. Pt did all exercises 2-3 times (see pt instructions for details of exercises).                OT Education - 07/23/14 1435    Education provided Yes   Education Details HEP theraputty green   Person(s) Educated Patient   Methods Explanation;Demonstration;Verbal cues;Handout;Tactile cues   Comprehension Verbalized understanding;Returned demonstration          OT Short Term Goals - 07/23/14 1530    OT SHORT TERM GOAL #1   Title Patient will utilize her cell phone to call a family member / friend  using her right hand to "dial" - 07/27/2014   Time 4   Period Weeks   Status On-going   OT SHORT TERM GOAL #2   Title Patient will legibly write at the single word level using her right hand with AE as needed   Time 4   Period Weeks   Status Achieved   OT SHORT TERM GOAL #3   Title  Patient will demonstrate beginning keyboarding skills to type incorporating bilateral upper extremities   Time 4   Period Weeks   Status On-going   OT SHORT TERM GOAL #4   Title Patient will demonstrate adequate strength and coordination in right hand to fasten seat belt in car.    Time 4   Period Weeks   Status On-going           OT Long Term Goals - 07/23/14 1530    OT LONG TERM GOAL #1   Title Patient will effectively don/ doff earrings or necklace using both hands -4/08/21/2014   Time 8   Period Weeks   Status On-going   OT LONG TERM GOAL #2   Title Patient will legibly write at the sentence level using her right hand with AE as needed   Time 8   Period Weeks   Status On-going   OT LONG TERM GOAL #3    Title Patient will apply makeup as for church using bilateral upper extremities, with right hand as dominant.   Time 8   Period Weeks   Status On-going   OT LONG TERM GOAL #4   Title Patient will demonstrate adequate coordination with keyboarding skills to send simple email to colleague or friend using bilateral upper extremities   Time 8   Period Weeks   Status On-going               Plan - 07/23/14 1528    Clinical Impression Statement Pt able to return demonstrate HEP for theraputty exercises for increased hand strength. Pt with global aphasia and apraxia   Pt will benefit from skilled therapeutic intervention in order to improve on the following deficits (Retired) Decreased cognition;Decreased coordination;Decreased strength;Impaired UE functional use;Impaired sensation   Rehab Potential Excellent   Clinical Impairments Affecting Rehab Potential global aphasia   OT Frequency 2x / week   OT Duration 8 weeks   OT Treatment/Interventions Self-care/ADL training;Therapeutic exercise;Neuromuscular education;DME and/or AE instruction;Therapeutic exercises;Therapeutic activities;Cognitive remediation/compensation;Patient/family education   Plan check HEP for theraputty, functional RUE use   Consulted and Agree with Plan of Care Patient;Family member/caregiver   Family Member Consulted Mom        Problem List Patient Active Problem List   Diagnosis Date Noted  . Morbid obesity 06/04/2014  . Dysphagia, pharyngoesophageal phase 06/04/2014  . PFO with atrial septal aneurysm 06/04/2014  . Hyperlipidemia LDL goal <70 06/04/2014  . Ischemic brain damage   . Acute respiratory failure with hypoxia   . CVA (cerebral vascular accident) 05/29/2014  . Aphasia complicating stroke 05/29/2014  . Hemiplegia affecting dominant side 05/29/2014  . CONSTIPATION 03/09/2008  . IRRITABLE BOWEL SYNDROME 03/09/2008    Norton Pastel, OTR/L 07/23/2014, 3:32 PM  [facility] [address] [place], [place], [address] Phone: [phone]   Fax:  [phone]

## 2014-07-23 NOTE — Therapy (Signed)
[facility] [address] [place], [place], [address] Phone: [phone]   Fax:  [phone]  Speech Language Pathology Treatment  Patient Details  Name: Rebecca Russo MRN: [id number] Date of Birth: 1976-05-12 Referring Provider:  Marvel Plan, MD  Encounter Date: 07/23/2014      End of Session - 07/23/14 1522    Visit Number 8   Number of Visits 24   Date for SLP Re-Evaluation 08/04/14   SLP Start Time 1450   SLP Stop Time  1530   SLP Time Calculation (min) 40 min   Activity Tolerance Patient tolerated treatment well      Past Medical History  Diagnosis Date  . H/O varicella   . Increased BMI 12/17/08  . Heart defect   . Stroke 05/29/2014    "weaker in right arm and speech problems since" (07/09/2014)    Past Surgical History  Procedure Laterality Date  . Laparoscopic cholecystectomy  06/20/2012  . Radiology with anesthesia N/A 05/29/2014    Procedure: RADIOLOGY WITH ANESTHESIA;  Surgeon: Oneal Grout, MD;  [facility] OR;  Service: Radiology;  Laterality: N/A;  . Tee without cardioversion N/A 06/03/2014    Procedure: TRANSESOPHAGEAL ECHOCARDIOGRAM (TEE);  Surgeon: Wendall Stade, MD;  [facility] END[facility];  Service: Cardiovascular;  Laterality: N/A;  . Patent foramen ovale closure  07/09/2014  . Patent foramen ovale closure N/A 07/09/2014    Procedure: PATENT FORAMEN OVALE CLOSURE;  Surgeon: Micheline Chapman, MD;  Location: [facility] CATH LAB;  Service: Cardiovascular;  Laterality: N/A;    There were no vitals filed for this visit.  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 Pt req'd cues for use of complete sentences.Similarites and differences completed with usual min-mod A from SLP for sentence structure.    Assessment / Recommendations / Plan   Plan Continue with current plan of care   Progression Toward Goals   Progression toward goals Progressing toward goals            SLP Short Term Goals - 07/23/14 1525    SLP SHORT TERM GOAL #1   Title Pt will perform simple convergent and divergent naming tasks with 85% accuracy and occassional minimal assistance (due 07/10/14)   Time --   Period --   Status Achieved   SLP SHORT TERM GOAL #2   Title Pt will demonstrate auditory comprehension of simple conversation and questions with 85% accuracy and occassional minimal assistance (07/10/14)   Time --   Period --   Status Achieved   SLP SHORT TERM GOAL #3   Title Pt will id object to phrase/sentence description with 85% accuracy and rare minimal assistance (07/10/14)   Time 1   Period Weeks   Status On-going   SLP SHORT TERM GOAL #4   Title ID object/picture to written description f:4 with 80% acccuracy and rare minimal assistance (  07/10/14)   Time --   Period --   Status Achieved          SLP Long Term Goals - 07/23/14 1527    SLP LONG TERM GOAL #1   Title Demonsrate compensations for aphasia during simple conversation 80% of opportunities and occassional minimal assistance (08/04/14)   Time 5   Period Weeks   Status On-going   SLP LONG TERM GOAL #2   Title Participate in simple covnersation verbally with 80% accuracy and occassional minimal assistance (08/04/14)   Time 5   Period Weeks   Status On-going   SLP LONG TERM GOAL #3   Title Comprehend simple 3-4 sentence paragraph with 80% accuracy and occassional min A.  (08/04/14)   Time 5   Period Weeks   Status On-going   SLP LONG TERM GOAL #4   Title Perform simple functional written expression tasks with 80% accuracy and occassional minimal A (08/04/14)   Time 5   Period Weeks   Status On-going          Plan - 07/23/14 1523    Clinical Impression Statement Pt cont with verbal expression deficits. ST remains necessary for improving skills for possible return to work.        Problem List Patient Active Problem List   Diagnosis Date Noted  . Morbid obesity 06/04/2014  . Dysphagia, pharyngoesophageal phase 06/04/2014  . PFO with atrial septal aneurysm 06/04/2014  . Hyperlipidemia LDL goal <70 06/04/2014  . Ischemic brain damage   . Acute respiratory failure with hypoxia   . CVA (cerebral vascular accident) 05/29/2014  . Aphasia complicating stroke 05/29/2014  . Hemiplegia affecting dominant side 05/29/2014  . CONSTIPATION 03/09/2008  . IRRITABLE BOWEL SYNDROME 03/09/2008    Verdie Mosher, SLP 07/23/2014, 3:28 PM  [facility] [address] [place], [place], [address] Phone: [phone]   Fax:  [phone]

## 2014-07-23 NOTE — Patient Instructions (Signed)
Theraputty Home exercise program:  Green   Make a ball Make a pancake Make a cone Repeat 5 times  Make a fat hot dog; squeeze as hard as you can. Do 10 times. Pull taffy - do 10 times Ring around the fingers - do 10 times 2 point pinch 3 point pinch Lateral pinch (all pinches 5 times)  Pt given hand out with pictures due global aphasia - able to return demonstrate

## 2014-07-27 ENCOUNTER — Telehealth: Payer: Self-pay | Admitting: *Deleted

## 2014-07-27 NOTE — Telephone Encounter (Signed)
Form,[employer] received,completed by Dr [name] and Andreas Blower no fee faxed 07-27-14.

## 2014-07-28 ENCOUNTER — Ambulatory Visit: Payer: Managed Care, Other (non HMO) | Admitting: Speech Pathology

## 2014-07-28 DIAGNOSIS — I69320 Aphasia following cerebral infarction: Secondary | ICD-10-CM | POA: Diagnosis not present

## 2014-07-28 NOTE — Patient Instructions (Signed)
Functional reading HW, bank hours

## 2014-07-28 NOTE — Therapy (Signed)
[facility] [address] [place], [place], [address] Phone: [phone]   Fax:  [phone]  Speech Language Pathology Treatment  Patient Details  Name: Rebecca Russo MRN: [id number] Date of Birth: 09/22/1975 Referring Provider:  Marvel Plan, MD  Encounter Date: 07/28/2014      End of Session - 07/28/14 1155    SLP Start Time 0846   SLP Stop Time  0930   SLP Time Calculation (min) 44 min   Activity Tolerance Patient tolerated treatment well      Past Medical History  Diagnosis Date  . H/O varicella   . Increased BMI 12/17/08  . Heart defect   . Stroke 05/29/2014    "weaker in right arm and speech problems since" (07/09/2014)    Past Surgical History  Procedure Laterality Date  . Laparoscopic cholecystectomy  06/20/2012  . Radiology with anesthesia N/A 05/29/2014    Procedure: RADIOLOGY WITH ANESTHESIA;  Surgeon: Oneal Grout, MD;  Location: [facility] OR;  Service: Radiology;  Laterality: N/A;  . Tee without cardioversion N/A 06/03/2014    Procedure: TRANSESOPHAGEAL ECHOCARDIOGRAM (TEE);  Surgeon: Wendall Stade, MD;  Location: [facility] ENDOSCOPY;  Service: Cardiovascular;  Laterality: N/A;  . Patent foramen ovale closure  07/09/2014  . Patent foramen ovale closure N/A 07/09/2014    Procedure: PATENT FORAMEN OVALE CLOSURE;  Surgeon: Micheline Chapman, MD;  Location: [facility] CATH LAB;  Service: Cardiovascular;  Laterality: N/A;    There were no vitals filed for this visit.  Visit Diagnosis: No diagnosis found.             ADULT SLP TREATMENT - 07/28/14 0855    General Information   Behavior/Cognition Alert;Cooperative;Pleasant mood   Treatment Provided   Treatment provided Cognitive-Linquistic   Pain Assessment   Pain Assessment No/denies pain   Cognitive-Linquistic Treatment   Treatment focused on Aphasia;Apraxia   Skilled Treatment Pt did not bring back homework. She stated that her homework "went well." Pt  verbalized  similarities and differences with  70%accuracy and usual min to mod A. Functional writing/reading  (donation tree)  80% with usual to  occassional min A for functional reading comprehension.  Functional reading homework (bank hours) provided.   Assessment / Recommendations / Plan   Plan Continue with current plan of care   Progression Toward Goals   Progression toward goals Progressing toward goals          SLP Education - 07/28/14 0933    Education provided No          SLP Short Term Goals - 07/28/14 1156    SLP SHORT TERM GOAL #1   Title Pt will perform simple convergent and divergent naming tasks with 85% accuracy and occassional minimal assistance (due 07/10/14)   Status Achieved   SLP SHORT TERM GOAL #2   Title Pt will demonstrate auditory comprehension of simple conversation and questions with 85% accuracy and occassional minimal assistance (07/10/14)   Status Achieved   SLP SHORT TERM GOAL #3   Title Pt will id object to phrase/sentence description with 85% accuracy and rare minimal assistance (07/10/14)   Time 1   Period Weeks   Status Achieved   SLP SHORT TERM GOAL #4   Title ID object/picture to written description f:4 with 80% acccuracy and rare minimal assistance (07/10/14)   Status Achieved          SLP Long Term Goals - 07/28/14 1156    SLP LONG TERM GOAL #  1   Title Demonsrate compensations for aphasia during simple conversation 80% of opportunities and occassional minimal assistance (08/04/14)   Time 5   Period Weeks   Status On-going   SLP LONG TERM GOAL #2   Title Participate in simple covnersation verbally with 80% accuracy and occassional minimal assistance (08/04/14)   Time 5   Period Weeks   Status On-going   SLP LONG TERM GOAL #3   Title Comprehend simple 3-4 sentence paragraph with 80% accuracy and occassional min A. (08/04/14)   Time 5   Period Weeks   Status On-going   SLP LONG TERM GOAL #4   Title Perform simple functional written  expression tasks with 80% accuracy and occassional minimal A (08/04/14)   Time 5   Period Weeks   Status On-going          Plan - 07/28/14 0933    Clinical Impression Statement Pt with improved reading comprehension at functional mildly complex sentence level. Pt required occassional min to mod A for vebal descriptions. Continue skilled ST to maxmize functioanl language skills.    Treatment/Interventions Language facilitation;Cueing hierarchy;Functional tasks;Patient/family education;Compensatory strategies;SLP instruction and feedback   Potential to Achieve Goals Good        Problem List Patient Active Problem List   Diagnosis Date Noted  . Morbid obesity 06/04/2014  . Dysphagia, pharyngoesophageal phase 06/04/2014  . PFO with atrial septal aneurysm 06/04/2014  . Hyperlipidemia LDL goal <70 06/04/2014  . Ischemic brain damage   . Acute respiratory failure with hypoxia   . CVA (cerebral vascular accident) 05/29/2014  . Aphasia complicating stroke 05/29/2014  . Hemiplegia affecting dominant side 05/29/2014  . CONSTIPATION 03/09/2008  . IRRITABLE BOWEL SYNDROME 03/09/2008    Anu Stagner, Radene Journey, SLP 07/28/2014, 11:58 AM  [facility] [address] [place], [place], [address] Phone: [phone]   Fax:  [phone]

## 2014-07-29 ENCOUNTER — Ambulatory Visit: Payer: Managed Care, Other (non HMO)

## 2014-07-29 ENCOUNTER — Ambulatory Visit: Payer: Managed Care, Other (non HMO) | Admitting: Occupational Therapy

## 2014-07-29 DIAGNOSIS — I69320 Aphasia following cerebral infarction: Secondary | ICD-10-CM | POA: Diagnosis not present

## 2014-07-29 DIAGNOSIS — R482 Apraxia: Secondary | ICD-10-CM

## 2014-07-29 NOTE — Patient Instructions (Signed)
  Please complete the assigned speech therapy homework and return it to your next session.  

## 2014-07-29 NOTE — Therapy (Signed)
[facility] [address] [place], [place], [address] Phone: [phone]   Fax:  [phone]  Speech Language Pathology Treatment  Patient Details  Name: Rebecca Russo MRN: [id number] Date of Birth: 12/19/1975 Referring Provider:  Marvel Plan, MD  Encounter Date: 07/29/2014      End of Session - 07/29/14 1449    Visit Number 10   Number of Visits 24   Date for SLP Re-Evaluation 08/04/14   SLP Start Time 1405   SLP Stop Time  1448   SLP Time Calculation (min) 43 min   Activity Tolerance Patient tolerated treatment well      Past Medical History  Diagnosis Date  . H/O varicella   . Increased BMI 12/17/08  . Heart defect   . Stroke 05/29/2014    "weaker in right arm and speech problems since" (07/09/2014)    Past Surgical History  Procedure Laterality Date  . Laparoscopic cholecystectomy  06/20/2012  . Radiology with anesthesia N/A 05/29/2014    Procedure: RADIOLOGY WITH ANESTHESIA;  Surgeon: Oneal Grout, MD;  Location: [facility] OR;  Service: Radiology;  Laterality: N/A;  . Tee without cardioversion N/A 06/03/2014    Procedure: TRANSESOPHAGEAL ECHOCARDIOGRAM (TEE);  Surgeon: Wendall Stade, MD;  Location: [facility] ENDOSCOPY;  Service: Cardiovascular;  Laterality: N/A;  . Patent foramen ovale closure  07/09/2014  . Patent foramen ovale closure N/A 07/09/2014    Procedure: PATENT FORAMEN OVALE CLOSURE;  Surgeon: Micheline Chapman, MD;  Location: [facility] CATH LAB;  Service: Cardiovascular;  Laterality: N/A;    There were no vitals filed for this visit.  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  Apraxia of speech      Subjective Assessment - 07/29/14 1408    Symptoms "Pretty good, how are you?" (upon SLP greeting)               ADULT SLP TREATMENT - 07/29/14 1409    General Information   Behavior/Cognition Alert;Cooperative;Pleasant mood   Treatment Provided   Treatment provided  Cognitive-Linquistic   Pain Assessment   Pain Assessment No/denies pain   Cognitive-Linquistic Treatment   Treatment focused on Apraxia;Aphasia   Skilled Treatment Reviewed homework and pt req'd rare mod A for one question, min A for one more question. Card description (safety issues) with min verbal cues from SLP for slow rate, which helped pt greatly. Success = 85%. Pt then sequenced 6-step sequence and described each card with 80% success. SLP req'd to incr pt awareness of errors x1-2 in each set of cards.   Assessment / Recommendations / Plan   Plan Continue with current plan of care   Progression Toward Goals   Progression toward goals Progressing toward goals          SLP Education - 07/28/14 0933    Education provided No          SLP Short Term Goals - 07/29/14 1452    SLP SHORT TERM GOAL #1   Title Pt will perform simple convergent and divergent naming tasks with 85% accuracy and occassional minimal assistance (due 07/10/14)   Status Achieved   SLP SHORT TERM GOAL #2   Title Pt will demonstrate auditory comprehension of simple conversation and questions with 85% accuracy and occassional minimal assistance (07/10/14)   Status Achieved   SLP SHORT TERM GOAL #3   Title Pt will id object to phrase/sentence description with 85% accuracy and rare minimal assistance (07/10/14)   Status Achieved  SLP SHORT TERM GOAL #4   Title ID object/picture to written description f:4 with 80% acccuracy and rare minimal assistance (07/10/14)   Status Achieved          SLP Long Term Goals - 07/29/14 1452    SLP LONG TERM GOAL #1   Title Demonsrate compensations for aphasia during simple conversation 80% of opportunities and occassional minimal assistance (08/04/14)   Status Achieved   SLP LONG TERM GOAL #2   Title Participate in simple covnersation verbally with 80% accuracy and occassional minimal assistance (08/04/14)   Time 3   Period Weeks   Status On-going   SLP LONG TERM GOAL #3    Title Comprehend simple 3-4 sentence paragraph with 80% accuracy and occassional min A. (08/04/14)   Time 3   Period Weeks   Status On-going   SLP LONG TERM GOAL #4   Title Perform simple functional written expression tasks with 80% accuracy and occassional minimal A (08/04/14)   Time 3   Period Weeks   Status On-going          Plan - 07/29/14 1453    Clinical Impression Statement Apraxia appears to be resolving - pt with more aphasic errors today than apraxic. Pt required rare min A for errors in vebal descriptions. Compesnsatory measure of slowed rate improved pt's linguistic function tremendously in these simple verbal tasks. Continue skilled ST to maxmize functioanl language skills.         Problem List Patient Active Problem List   Diagnosis Date Noted  . Morbid obesity 06/04/2014  . Dysphagia, pharyngoesophageal phase 06/04/2014  . PFO with atrial septal aneurysm 06/04/2014  . Hyperlipidemia LDL goal <70 06/04/2014  . Ischemic brain damage   . Acute respiratory failure with hypoxia   . CVA (cerebral vascular accident) 05/29/2014  . Aphasia complicating stroke 05/29/2014  . Hemiplegia affecting dominant side 05/29/2014  . CONSTIPATION 03/09/2008  . IRRITABLE BOWEL SYNDROME 03/09/2008    Rebecca Russo 07/29/2014, 2:54 PM  [facility] [address] [place], [place], [address] Phone: [phone]   Fax:  [phone]

## 2014-07-30 ENCOUNTER — Ambulatory Visit: Payer: Managed Care, Other (non HMO) | Admitting: Occupational Therapy

## 2014-07-30 DIAGNOSIS — I69320 Aphasia following cerebral infarction: Secondary | ICD-10-CM | POA: Diagnosis not present

## 2014-07-30 DIAGNOSIS — IMO0002 Reserved for concepts with insufficient information to code with codable children: Secondary | ICD-10-CM

## 2014-07-30 DIAGNOSIS — I69359 Hemiplegia and hemiparesis following cerebral infarction affecting unspecified side: Secondary | ICD-10-CM

## 2014-07-30 NOTE — Therapy (Signed)
[facility] [address] [place], [place], [address] Phone: [phone]   Fax:  [phone]  Occupational Therapy Treatment  Patient Details  Name: Rebecca Russo MRN: [id number] Date of Birth: 03-21-1976 Referring Provider:  Rosalin Hawking, MD  Encounter Date: 07/30/2014      OT End of Session - 07/30/14 0943    Visit Number 4   Number of Visits 16   Date for OT Re-Evaluation 08/20/14   Authorization Type [employer]   OT Start Time [phone]   OT Stop Time 0935   OT Time Calculation (min) 45 min   Activity Tolerance Patient tolerated treatment well      Past Medical History  Diagnosis Date  . H/O varicella   . Increased BMI 12/17/08  . Heart defect   . Stroke 05/29/2014    "weaker in right arm and speech problems since" (07/09/2014)    Past Surgical History  Procedure Laterality Date  . Laparoscopic cholecystectomy  06/20/2012  . Radiology with anesthesia N/A 05/29/2014    Procedure: RADIOLOGY WITH ANESTHESIA;  Surgeon: Rob Hickman, MD;  Location: [facility];  Service: Radiology;  Laterality: N/A;  . Tee without cardioversion N/A 06/03/2014    Procedure: TRANSESOPHAGEAL ECHOCARDIOGRAM (TEE);  Surgeon: Josue Hector, MD;  Location: [facility] ENDOSCOPY;  Service: Cardiovascular;  Laterality: N/A;  . Patent foramen ovale closure  07/09/2014  . Patent foramen ovale closure N/A 07/09/2014    Procedure: PATENT FORAMEN OVALE CLOSURE;  Surgeon: Blane Ohara, MD;  Location: [facility] CATH LAB;  Service: Cardiovascular;  Laterality: N/A;    There were no vitals filed for this visit.  Visit Diagnosis:  Lack of coordination due to stroke  Hemiparesis affecting dominant side as late effect of cerebrovascular accident      Subjective Assessment - 07/30/14 0856    Symptoms "It's ok" (re: Rt hand). Pt reports eating some with the Rt hand.    Pertinent History see snapshot, CVA, PFO   Limitations Aphasia, expressive>receptive   Patient  Stated Goals Use my right hand!   Currently in Pain? No/denies                    OT Treatments/Exercises (OP) - 07/30/14 0001    ADLs   ADL Comments Pt encouraged to use Rt hand for safe tasks at home: eating, putting light groceries away, opening drawers/cabinets, setting table, using her phone, folding laundry, etc secondary to learned non use. Also began checking STG's. Pt demo use of cell phone with Rt hand but initially began with Lt hand and required cues to use Rt hand.    Hand Exercises   Other Hand Exercises Pt able to return demo of putty exercises after review today (from previously issued HEP) for Rt grip and pinch strength.    Fine Motor Coordination   Small Pegboard Pt placing small pegs in pegboard Rt hand copying design w/ min drops to mod drops Rt hand. Pt copying design @ 100% accuracy   Other Fine Motor Exercises Pre-writing exercises for control and coordination Rt hand: tracing shapes and letters. Pt asked to call out letters while tracing with only 1 error. Pt also writing name and simple 3 letter words with 100% legibility. Pt also given handouts to practice writing at home and Rt handed typing words to practice at home. Pt/friend also instructed in Typing Master program to download at home  OT Short Term Goals - 07/30/14 0944    OT SHORT TERM GOAL #1   Title Patient will utilize her cell phone to call a family member / friend  using her right hand to "dial" - 07/27/2014   Time 4   Period Weeks   Status Achieved   OT SHORT TERM GOAL #2   Title Patient will legibly write at the single word level using her right hand with AE as needed   Time 4   Period Weeks   Status Achieved   OT SHORT TERM GOAL #3   Title Patient will demonstrate beginning keyboarding skills to type incorporating bilateral upper extremities   Time 4   Period Weeks   Status On-going   OT SHORT TERM GOAL #4   Title Patient will demonstrate adequate strength  and coordination in right hand to fasten seat belt in car.    Time 4   Period Weeks   Status On-going   OT SHORT TERM GOAL #5   Title Patient will complete simple meal prep (cold) with verbal cueing only   Status On-going   Additional Short Term Goals   Additional Short Term Goals Yes           OT Long Term Goals - 07/30/14 0945    OT LONG TERM GOAL #1   Title Patient will effectively don/ doff earrings or necklace using both hands -4/08/21/2014   Time 8   Period Weeks   Status On-going   OT LONG TERM GOAL #2   Title Patient will legibly write at the sentence level using her right hand with AE as needed   Time 8   Period Weeks   Status On-going   OT LONG TERM GOAL #3   Title Patient will apply makeup as for church using bilateral upper extremities, with right hand as dominant.   Time 8   Period Weeks   Status On-going   OT LONG TERM GOAL #4   Title Patient will demonstrate adequate coordination with keyboarding skills to send simple email to colleague or friend using bilateral upper extremities   Time 8   Period Weeks   Status On-going   OT LONG TERM GOAL #5   Title Patient will effectively pay bills and balance checking account with modified independence.     Status On-going   OT LONG TERM GOAL #6   Title Patient will prepare simple family meal utilizing stovetop and/or oven with modified independence   Status On-going               Plan - 07/30/14 0951    Clinical Impression Statement Pt is progressing with fine motor coordination and control. Pt also demo improved writing, but limited d/t expressive aphasia. Pt does demo some learned non use of Rt hand and was encouraged to use Rt hand for all SAFE tasks at home. Pt met STG's #1 and #2   Plan Assess remaining STG's. Practice donning jewelry if pt brings; practice typing         Problem List Patient Active Problem List   Diagnosis Date Noted  . Morbid obesity 06/04/2014  . Dysphagia, pharyngoesophageal  phase 06/04/2014  . PFO with atrial septal aneurysm 06/04/2014  . Hyperlipidemia LDL goal <70 06/04/2014  . Ischemic brain damage   . Acute respiratory failure with hypoxia   . CVA (cerebral vascular accident) 05/29/2014  . Aphasia complicating stroke 63/14/9702  . Hemiplegia affecting dominant side 05/29/2014  . CONSTIPATION 03/09/2008  .  IRRITABLE BOWEL SYNDROME 03/09/2008    Carey Bullocks, OTR/L 07/30/2014, 9:54 AM  [place] [address] [place], [place], [address] Phone: [phone]   Fax:  [phone]

## 2014-07-31 ENCOUNTER — Ambulatory Visit: Payer: Managed Care, Other (non HMO) | Admitting: Occupational Therapy

## 2014-07-31 ENCOUNTER — Ambulatory Visit: Payer: Managed Care, Other (non HMO)

## 2014-07-31 DIAGNOSIS — R482 Apraxia: Secondary | ICD-10-CM

## 2014-07-31 DIAGNOSIS — I69320 Aphasia following cerebral infarction: Secondary | ICD-10-CM | POA: Diagnosis not present

## 2014-07-31 NOTE — Therapy (Signed)
[facility] [address] [place], [place], [address] Phone: [phone]   Fax:  [phone]  Speech Language Pathology Treatment  Patient Details  Name: Rebecca Russo MRN: [id number] Date of Birth: 04-07-1976 Referring Provider:  Marvel Plan, MD  Encounter Date: 07/31/2014    Past Medical History  Diagnosis Date  . H/O varicella   . Increased BMI 12/17/08  . Heart defect   . Stroke 05/29/2014    "weaker in right arm and speech problems since" (07/09/2014)    Past Surgical History  Procedure Laterality Date  . Laparoscopic cholecystectomy  06/20/2012  . Radiology with anesthesia N/A 05/29/2014    Procedure: RADIOLOGY WITH ANESTHESIA;  Surgeon: Oneal Grout, MD;  Location: [facility] OR;  Service: Radiology;  Laterality: N/A;  . Tee without cardioversion N/A 06/03/2014    Procedure: TRANSESOPHAGEAL ECHOCARDIOGRAM (TEE);  Surgeon: Wendall Stade, MD;  Location: [facility] ENDOSCOPY;  Service: Cardiovascular;  Laterality: N/A;  . Patent foramen ovale closure  07/09/2014  . Patent foramen ovale closure N/A 07/09/2014    Procedure: PATENT FORAMEN OVALE CLOSURE;  Surgeon: Micheline Chapman, MD;  Location: [facility] CATH LAB;  Service: Cardiovascular;  Laterality: N/A;    There were no vitals filed for this visit.  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  Apraxia of speech      Subjective Assessment - 07/31/14 1541    Symptoms Pt with functional simple conversation re: MD visit.               ADULT SLP TREATMENT - 07/31/14 1543    General Information   Behavior/Cognition Alert;Cooperative;Pleasant mood   Treatment Provided   Treatment provided Cognitive-Linquistic   Pain Assessment   Pain Assessment No/denies pain   Cognitive-Linquistic Treatment   Treatment focused on Apraxia;Aphasia   Skilled Treatment Conversation re: simple topics 95% with rare min A and extra time. Pt wrote words for "shopping list"  with 80% success, incr'd to 85% with min A.    Assessment / Recommendations / Plan   Plan Continue with current plan of care   Progression Toward Goals   Progression toward goals Progressing toward goals            SLP Short Term Goals - 07/31/14 1545    SLP SHORT TERM GOAL #1   Title Pt will perform simple convergent and divergent naming tasks with 85% accuracy and occassional minimal assistance (due 07/10/14)   Status Achieved   SLP SHORT TERM GOAL #2   Title Pt will demonstrate auditory comprehension of simple conversation and questions with 85% accuracy and occassional minimal assistance (07/10/14)   Status Achieved   SLP SHORT TERM GOAL #3   Title Pt will id object to phrase/sentence description with 85% accuracy and rare minimal assistance (07/10/14)   Status Achieved   SLP SHORT TERM GOAL #4   Title ID object/picture to written description f:4 with 80% acccuracy and rare minimal assistance (07/10/14)   Status Achieved          SLP Long Term Goals - 07/31/14 1545    SLP LONG TERM GOAL #1   Title Demonsrate compensations for aphasia during simple conversation 80% of opportunities and occassional minimal assistance (08/04/14)   Status Achieved   SLP LONG TERM GOAL #2   Title Participate in simple covnersation verbally with 80% accuracy and occassional minimal assistance (08/04/14)   Time --   Period --   Status Achieved   SLP LONG TERM  GOAL #3   Title Comprehend simple 3-4 sentence paragraph with 80% accuracy and occassional min A. (08/04/14)   Time 3   Period Weeks   Status On-going   SLP LONG TERM GOAL #4   Title Perform simple functional written expression tasks with 80% accuracy and occassional minimal A (08/04/14)   Time --   Period --   Status Achieved          Problem List Patient Active Problem List   Diagnosis Date Noted  . Morbid obesity 06/04/2014  . Dysphagia, pharyngoesophageal phase 06/04/2014  . PFO with atrial septal aneurysm 06/04/2014  .  Hyperlipidemia LDL goal <70 06/04/2014  . Ischemic brain damage   . Acute respiratory failure with hypoxia   . CVA (cerebral vascular accident) 05/29/2014  . Aphasia complicating stroke 05/29/2014  . Hemiplegia affecting dominant side 05/29/2014  . CONSTIPATION 03/09/2008  . IRRITABLE BOWEL SYNDROME 03/09/2008    [name] 07/31/2014, 4:21 PM  [facility] [address] [place], [place], [address] Phone: [phone]   Fax:  [phone]

## 2014-08-03 ENCOUNTER — Encounter: Payer: Self-pay | Admitting: Occupational Therapy

## 2014-08-03 ENCOUNTER — Ambulatory Visit: Payer: Managed Care, Other (non HMO) | Admitting: Occupational Therapy

## 2014-08-03 DIAGNOSIS — IMO0002 Reserved for concepts with insufficient information to code with codable children: Secondary | ICD-10-CM

## 2014-08-03 DIAGNOSIS — I69320 Aphasia following cerebral infarction: Secondary | ICD-10-CM | POA: Diagnosis not present

## 2014-08-03 DIAGNOSIS — I69359 Hemiplegia and hemiparesis following cerebral infarction affecting unspecified side: Secondary | ICD-10-CM

## 2014-08-03 NOTE — Therapy (Signed)
[facility] [address] [place], [place], [address] Phone: [phone]   Fax:  [phone]  Occupational Therapy Treatment  Patient Details  Name: Rebecca Russo MRN: [id number] Date of Birth: 1975/09/01 Referring Provider:  Marvel Plan, MD  Encounter Date: 08/03/2014      OT End of Session - 08/03/14 1731    Visit Number 5   Number of Visits 16   Date for OT Re-Evaluation 08/20/14   Authorization Type [employer]   OT Start Time 1617   OT Stop Time 1658   OT Time Calculation (min) 41 min   Activity Tolerance Patient tolerated treatment well      Past Medical History  Diagnosis Date  . H/O varicella   . Increased BMI 12/17/08  . Heart defect   . Stroke 05/29/2014    "weaker in right arm and speech problems since" (07/09/2014)    Past Surgical History  Procedure Laterality Date  . Laparoscopic cholecystectomy  06/20/2012  . Radiology with anesthesia N/A 05/29/2014    Procedure: RADIOLOGY WITH ANESTHESIA;  Surgeon: Oneal Grout, MD;  Location: [facility] OR;  Service: Radiology;  Laterality: N/A;  . Tee without cardioversion N/A 06/03/2014    Procedure: TRANSESOPHAGEAL ECHOCARDIOGRAM (TEE);  Surgeon: Wendall Stade, MD;  Location: [facility] ENDOSCOPY;  Service: Cardiovascular;  Laterality: N/A;  . Patent foramen ovale closure  07/09/2014  . Patent foramen ovale closure N/A 07/09/2014    Procedure: PATENT FORAMEN OVALE CLOSURE;  Surgeon: Micheline Chapman, MD;  Location: [facility] CATH LAB;  Service: Cardiovascular;  Laterality: N/A;    There were no vitals filed for this visit.  Visit Diagnosis:  Lack of coordination due to stroke  Hemiparesis affecting dominant side as late effect of cerebrovascular accident      Subjective Assessment - 08/03/14 1621    Pertinent History (p) see snapshot, CVA, PFO   Limitations (p) Aphasia, expressive>receptive   Patient Stated Goals (p) Use my right hand!   Currently in Pain? (p) No/denies                     OT Treatments/Exercises (OP) - 08/03/14 0001    ADLs   Grooming Practiced donning and doffing earrings using both hands- pt able to complete mod I with increased time and use of a mirror.   Writing Pt able to print name, address and copy at one sentence level legibly without AE.     ADL Comments Addressed typing with patient - pt needed min vc's due to language deficits and some mild incoordination. Pt improved with practice.                  OT Short Term Goals - 08/03/14 1732    OT SHORT TERM GOAL #1   Title Patient will utilize her cell phone to call a family member / friend  using her right hand to "dial" - 07/27/2014   Time 4   Period Weeks   Status Achieved   OT SHORT TERM GOAL #2   Title Patient will legibly write at the single word level using her right hand with AE as needed   Time 4   Period Weeks   Status Achieved   OT SHORT TERM GOAL #3   Title Patient will demonstrate beginning keyboarding skills to type incorporating bilateral upper extremities   Time 4   Period Weeks   Status Achieved   OT SHORT TERM GOAL #4   Title Patient  will demonstrate adequate strength and coordination in right hand to fasten seat belt in car.    Time 4   Period Weeks   Status Achieved   OT SHORT TERM GOAL #5   Title Patient will complete simple meal prep (cold) with verbal cueing only   Status On-going           OT Long Term Goals - 08/03/14 1733    OT LONG TERM GOAL #1   Title Patient will effectively don/ doff earrings or necklace using both hands -4/08/21/2014   Time 8   Period Weeks   Status Achieved   OT LONG TERM GOAL #2   Title Patient will legibly write at the sentence level using her right hand with AE as needed   Time 8   Period Weeks   Status On-going   OT LONG TERM GOAL #3   Title Patient will apply makeup as for church using bilateral upper extremities, with right hand as dominant.   Time 8   Period Weeks   Status On-going    OT LONG TERM GOAL #4   Title Patient will demonstrate adequate coordination with keyboarding skills to send simple email to colleague or friend using bilateral upper extremities   Time 8   Period Weeks   Status On-going   OT LONG TERM GOAL #5   Title Patient will effectively pay bills and balance checking account with modified independence.     Status On-going   OT LONG TERM GOAL #6   Title Patient will prepare simple family meal utilizing stovetop and/or oven with modified independence   Status On-going               Plan - 08/03/14 1731    Clinical Impression Statement Pt making excellent progress toward goals and reports she is using R hand much more at home.   Pt will benefit from skilled therapeutic intervention in order to improve on the following deficits (Retired) Decreased cognition;Decreased coordination;Decreased strength;Impaired UE functional use;Impaired sensation   Rehab Potential Excellent   Clinical Impairments Affecting Rehab Potential global aphasia   OT Frequency 2x / week   OT Duration 8 weeks   OT Treatment/Interventions Self-care/ADL training;Therapeutic exercise;Neuromuscular education;DME and/or AE instruction;Therapeutic exercises;Therapeutic activities;Cognitive remediation/compensation;Patient/family education   Plan cooking activity   Consulted and Agree with Plan of Care Patient        Problem List Patient Active Problem List   Diagnosis Date Noted  . Morbid obesity 06/04/2014  . Dysphagia, pharyngoesophageal phase 06/04/2014  . PFO with atrial septal aneurysm 06/04/2014  . Hyperlipidemia LDL goal <70 06/04/2014  . Ischemic brain damage   . Acute respiratory failure with hypoxia   . CVA (cerebral vascular accident) 05/29/2014  . Aphasia complicating stroke 05/29/2014  . Hemiplegia affecting dominant side 05/29/2014  . CONSTIPATION 03/09/2008  . IRRITABLE BOWEL SYNDROME 03/09/2008    Norton Pastel, OTR/L 08/03/2014, 5:34  PM  [facility] [address] [place], [place], [address] Phone: [phone]   Fax:  [phone]

## 2014-08-04 NOTE — Telephone Encounter (Signed)
Spoke with patient's mother, informed her that last form was sent on 07/24/14, mother states that another form was faxed on 07/27/14 and she states that they are frustrated because of all the forms that are being sent and patient is still not getting her disability. Informed patient that I would contact [employer] about the forms.

## 2014-08-04 NOTE — Telephone Encounter (Signed)
[employer] at [phone], spoke with Jasmine December and informed her that patient cannot afford to continue to pay for forms, informed Jasmine December of patient's surgery date on 2/25 and last office visit was on 07/07/14, next office visit is not until 09/14/14, informed her of how patient feels that there has been no improvement and until seen in the office again cannot update patient's condition. Jasmine December stated that she would forward information on to Veterinary surgeon.

## 2014-08-04 NOTE — Telephone Encounter (Signed)
Patient's mother checking status of FMLA paperwork.  Requesting paper be completed today in order for daughter to received disability payments.  Mother stated she has money to pay for form.  Please call @ [phone].

## 2014-08-05 ENCOUNTER — Ambulatory Visit: Payer: Managed Care, Other (non HMO) | Admitting: Occupational Therapy

## 2014-08-05 ENCOUNTER — Telehealth: Payer: Self-pay | Admitting: Neurology

## 2014-08-05 ENCOUNTER — Encounter: Payer: Self-pay | Admitting: Occupational Therapy

## 2014-08-05 ENCOUNTER — Ambulatory Visit: Payer: Managed Care, Other (non HMO)

## 2014-08-05 DIAGNOSIS — IMO0002 Reserved for concepts with insufficient information to code with codable children: Secondary | ICD-10-CM

## 2014-08-05 DIAGNOSIS — I69320 Aphasia following cerebral infarction: Secondary | ICD-10-CM | POA: Diagnosis not present

## 2014-08-05 DIAGNOSIS — I69359 Hemiplegia and hemiparesis following cerebral infarction affecting unspecified side: Secondary | ICD-10-CM

## 2014-08-05 DIAGNOSIS — R449 Unspecified symptoms and signs involving general sensations and perceptions: Secondary | ICD-10-CM

## 2014-08-05 DIAGNOSIS — R482 Apraxia: Secondary | ICD-10-CM

## 2014-08-05 DIAGNOSIS — R4189 Other symptoms and signs involving cognitive functions and awareness: Secondary | ICD-10-CM

## 2014-08-05 NOTE — Therapy (Signed)
[facility] [address] [place], [place], [address] Phone: [phone]   Fax:  [phone]  Speech Language Pathology Treatment  Patient Details  Name: Rebecca Russo MRN: [id number] Date of Birth: 1975-10-16 Referring Provider:  Marvel Plan, MD  Encounter Date: 08/05/2014      End of Session - 08/05/14 1356    Visit Number 11   Number of Visits 24   Date for SLP Re-Evaluation 08/04/14   SLP Start Time 1319   SLP Stop Time  1400   SLP Time Calculation (min) 41 min   Activity Tolerance Patient tolerated treatment well      Past Medical History  Diagnosis Date  . H/O varicella   . Increased BMI 12/17/08  . Heart defect   . Stroke 05/29/2014    "weaker in right arm and speech problems since" (07/09/2014)    Past Surgical History  Procedure Laterality Date  . Laparoscopic cholecystectomy  06/20/2012  . Radiology with anesthesia N/A 05/29/2014    Procedure: RADIOLOGY WITH ANESTHESIA;  Surgeon: Oneal Grout, MD;  Location: [facility] OR;  Service: Radiology;  Laterality: N/A;  . Tee without cardioversion N/A 06/03/2014    Procedure: TRANSESOPHAGEAL ECHOCARDIOGRAM (TEE);  Surgeon: Wendall Stade, MD;  Location: [facility] ENDOSCOPY;  Service: Cardiovascular;  Laterality: N/A;  . Patent foramen ovale closure  07/09/2014  . Patent foramen ovale closure N/A 07/09/2014    Procedure: PATENT FORAMEN OVALE CLOSURE;  Surgeon: Micheline Chapman, MD;  Location: [facility] CATH LAB;  Service: Cardiovascular;  Laterality: N/A;    There were no vitals filed for this visit.  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  Apraxia of speech      Subjective Assessment - 08/05/14 1355    Symptoms "went well" (re: homework) Pt indicated she would like to work with her writing as well as verbal expression.               ADULT SLP TREATMENT - 08/05/14 1323    General Information   Behavior/Cognition Alert;Cooperative;Pleasant  mood   Treatment Provided   Treatment provided Cognitive-Linquistic   Pain Assessment   Pain Assessment No/denies pain   Cognitive-Linquistic Treatment   Treatment focused on Apraxia;Aphasia   Skilled Treatment Simple sentence responses with extra time and modified independence 95%. Pt wrote words in category matrix with consistent mod-max A  due to aphasia.   Assessment / Recommendations / Plan   Plan Continue with current plan of care   Progression Toward Goals   Progression toward goals Progressing toward goals            SLP Short Term Goals - 07/31/14 1545    SLP SHORT TERM GOAL #1   Title Pt will perform simple convergent and divergent naming tasks with 85% accuracy and occassional minimal assistance (due 07/10/14)   Status Achieved   SLP SHORT TERM GOAL #2   Title Pt will demonstrate auditory comprehension of simple conversation and questions with 85% accuracy and occassional minimal assistance (07/10/14)   Status Achieved   SLP SHORT TERM GOAL #3   Title Pt will id object to phrase/sentence description with 85% accuracy and rare minimal assistance (07/10/14)   Status Achieved   SLP SHORT TERM GOAL #4   Title ID object/picture to written description f:4 with 80% acccuracy and rare minimal assistance (07/10/14)   Status Achieved          SLP Long Term Goals - 08/05/14 1405  SLP LONG TERM GOAL #1   Title Demonsrate compensations for aphasia during simple conversation 80% of opportunities and occassional minimal assistance (08/04/14)   Status Achieved   SLP LONG TERM GOAL #2   Title Participate in simple covnersation verbally with 80% accuracy and occassional minimal assistance (08/04/14)   Status Achieved   SLP LONG TERM GOAL #3   Title Comprehend simple 3-4 sentence paragraph with 80% accuracy and occassional min A. (08/04/14)   Time 3   Period Weeks   Status On-going   SLP LONG TERM GOAL #4   Title Perform simple functional written expression tasks with 80%  accuracy and occassional minimal A (08/04/14)   Status Achieved   SLP LONG TERM GOAL #5   Title pt will perform mod complex writing tasks with 70% success and occasinal min A   Time 2   Period Weeks   Status New          Plan - 08/05/14 1405    Clinical Impression Statement In simple conversation iwth modified independence pt's independence is improving. Continue skilled ST to maxmize functioanl language skills. PT would also like to target written language.   Speech Therapy Frequency 3x / week   Duration 2 weeks   Treatment/Interventions Language facilitation;Cueing hierarchy;Functional tasks;Patient/family education;Compensatory strategies;SLP instruction and feedback   Potential to Achieve Goals Good   Potential Considerations Severity of impairments        Problem List Patient Active Problem List   Diagnosis Date Noted  . Morbid obesity 06/04/2014  . Dysphagia, pharyngoesophageal phase 06/04/2014  . PFO with atrial septal aneurysm 06/04/2014  . Hyperlipidemia LDL goal <70 06/04/2014  . Ischemic brain damage   . Acute respiratory failure with hypoxia   . CVA (cerebral vascular accident) 05/29/2014  . Aphasia complicating stroke 05/29/2014  . Hemiplegia affecting dominant side 05/29/2014  . CONSTIPATION 03/09/2008  . IRRITABLE BOWEL SYNDROME 03/09/2008    Verdie Mosher, SLP 08/05/2014, 2:08 PM  [facility] [address] [place], [place], [address] Phone: [phone]   Fax:  [phone]

## 2014-08-05 NOTE — Therapy (Signed)
[facility] [address] [place], [place], [address] Phone: [phone]   Fax:  [phone]  Occupational Therapy Treatment  Patient Details  Name: Rebecca Russo MRN: [id number] Date of Birth: 02-21-1976 Referring Provider:  Marvel Plan, MD  Encounter Date: 08/05/2014      OT End of Session - 08/05/14 1453    Visit Number 6   Number of Visits 16   Date for OT Re-Evaluation 08/20/14   Authorization Type [employer]   OT Start Time 1405   OT Stop Time 1445   OT Time Calculation (min) 40 min   Activity Tolerance Patient tolerated treatment well   Behavior During Therapy [facility] for tasks assessed/performed      Past Medical History  Diagnosis Date  . H/O varicella   . Increased BMI 12/17/08  . Heart defect   . Stroke 05/29/2014    "weaker in right arm and speech problems since" (07/09/2014)    Past Surgical History  Procedure Laterality Date  . Laparoscopic cholecystectomy  06/20/2012  . Radiology with anesthesia N/A 05/29/2014    Procedure: RADIOLOGY WITH ANESTHESIA;  Surgeon: Oneal Grout, MD;  Location: [facility] OR;  Service: Radiology;  Laterality: N/A;  . Tee without cardioversion N/A 06/03/2014    Procedure: TRANSESOPHAGEAL ECHOCARDIOGRAM (TEE);  Surgeon: Wendall Stade, MD;  Location: [facility] ENDOSCOPY;  Service: Cardiovascular;  Laterality: N/A;  . Patent foramen ovale closure  07/09/2014  . Patent foramen ovale closure N/A 07/09/2014    Procedure: PATENT FORAMEN OVALE CLOSURE;  Surgeon: Micheline Chapman, MD;  Location: [facility] CATH LAB;  Service: Cardiovascular;  Laterality: N/A;    There were no vitals filed for this visit.  Visit Diagnosis:  Hemiparesis affecting dominant side as late effect of cerebrovascular accident  Sensory deficit, right  Lack of coordination due to stroke      Subjective Assessment - 08/05/14 1446    Symptoms I don't feel these as much - patient indicates numbness in thumb and index finger of  right hand   Pertinent History see snapshot, CVA, PFO   Limitations Aphasia, expressive>receptive   Patient Stated Goals Use my right hand!   Currently in Pain? No/denies   Pain Score 0-No pain                    OT Treatments/Exercises (OP) - 08/05/14 0001    ADLs   Cooking Worked with patient in ADL kitchen to address her ability to function in kitchen setting.  Patient able to identify ingredients and supplies for scrambled eggs.  Patient incorporated her right hand into this task after only brief verbal cueing initially.  Patient with occasional difficulty applying adequate pressure or speed with right hand, but with repetition able to improve.  Patient needing occasional cues to compensate with vision due to decreased sensation as evidenced by multiple attempts to grab dish cloth from drawer.  Patient with safe performance of this task.  ABle to read simple (2 step) instruction on unfamiliar box mix, and pass on information to this therapist accurately.  Able to answer questions regarding content without cueing                 OT Education - 08/05/14 1452    Education provided Yes   Education Details Patient instructed to attempt cooking at home with supervision from family.  Stovetop cooking is acceptable   Person(s) Educated Patient   Methods Explanation;Demonstration   Comprehension Verbalized understanding  OT Short Term Goals - 08/03/14 1732    OT SHORT TERM GOAL #1   Title Patient will utilize her cell phone to call a family member / friend  using her right hand to "dial" - 07/27/2014   Time 4   Period Weeks   Status Achieved   OT SHORT TERM GOAL #2   Title Patient will legibly write at the single word level using her right hand with AE as needed   Time 4   Period Weeks   Status Achieved   OT SHORT TERM GOAL #3   Title Patient will demonstrate beginning keyboarding skills to type incorporating bilateral upper extremities   Time 4   Period  Weeks   Status Achieved   OT SHORT TERM GOAL #4   Title Patient will demonstrate adequate strength and coordination in right hand to fasten seat belt in car.    Time 4   Period Weeks   Status Achieved   OT SHORT TERM GOAL #5   Title Patient will complete simple meal prep (cold) with verbal cueing only   Status On-going           OT Long Term Goals - 08/03/14 1733    OT LONG TERM GOAL #1   Title Patient will effectively don/ doff earrings or necklace using both hands -4/08/21/2014   Time 8   Period Weeks   Status Achieved   OT LONG TERM GOAL #2   Title Patient will legibly write at the sentence level using her right hand with AE as needed   Time 8   Period Weeks   Status On-going   OT LONG TERM GOAL #3   Title Patient will apply makeup as for church using bilateral upper extremities, with right hand as dominant.   Time 8   Period Weeks   Status On-going   OT LONG TERM GOAL #4   Title Patient will demonstrate adequate coordination with keyboarding skills to send simple email to colleague or friend using bilateral upper extremities   Time 8   Period Weeks   Status On-going   OT LONG TERM GOAL #5   Title Patient will effectively pay bills and balance checking account with modified independence.     Status On-going   OT LONG TERM GOAL #6   Title Patient will prepare simple family meal utilizing stovetop and/or oven with modified independence   Status On-going               Plan - 08/05/14 1453    Clinical Impression Statement Patient continues to make excellent progress toward long term goals - using right hand much more frequently   Pt will benefit from skilled therapeutic intervention in order to improve on the following deficits (Retired) Decreased cognition;Decreased coordination;Decreased strength;Impaired UE functional use;Impaired sensation   Rehab Potential Excellent   Clinical Impairments Affecting Rehab Potential global aphasia   OT Frequency 2x / week    OT Duration 8 weeks   OT Treatment/Interventions Self-care/ADL training;Therapeutic exercise;Neuromuscular education;DME and/or AE instruction;Therapeutic exercises;Therapeutic activities;Cognitive remediation/compensation;Patient/family education   OT Home Exercise Plan ongoing   Consulted and Agree with Plan of Care Patient        Problem List Patient Active Problem List   Diagnosis Date Noted  . Morbid obesity 06/04/2014  . Dysphagia, pharyngoesophageal phase 06/04/2014  . PFO with atrial septal aneurysm 06/04/2014  . Hyperlipidemia LDL goal <70 06/04/2014  . Ischemic brain damage   . Acute respiratory failure with hypoxia   .  CVA (cerebral vascular accident) 05/29/2014  . Aphasia complicating stroke 05/29/2014  . Hemiplegia affecting dominant side 05/29/2014  . CONSTIPATION 03/09/2008  . IRRITABLE BOWEL SYNDROME 03/09/2008    Collier Salina, OTR/L 08/05/2014, 2:56 PM   [facility] [address] [place], [place], [address] Phone: [phone]   Fax:  [phone]

## 2014-08-05 NOTE — Telephone Encounter (Signed)
Patient's mother checking status of form.  Please call and advise.

## 2014-08-06 ENCOUNTER — Ambulatory Visit: Payer: Managed Care, Other (non HMO) | Admitting: Speech Pathology

## 2014-08-06 DIAGNOSIS — I69320 Aphasia following cerebral infarction: Secondary | ICD-10-CM | POA: Diagnosis not present

## 2014-08-06 NOTE — Patient Instructions (Signed)
Unscramble sentences for [facility]

## 2014-08-06 NOTE — Therapy (Signed)
[facility] [address] [place], [place], [address] Phone: [phone]   Fax:  [phone]  Speech Language Pathology Treatment  Patient Details  Name: Rebecca Russo MRN: [id number] Date of Birth: 1975/10/29 Referring Provider:  Marvel Plan, MD  Encounter Date: 08/06/2014      End of Session - 08/06/14 1500    Visit Number 12   Number of Visits 24   Date for SLP Re-Evaluation 08/04/14   SLP Start Time 1318   SLP Stop Time  1400   SLP Time Calculation (min) 42 min   Activity Tolerance Patient tolerated treatment well      Past Medical History  Diagnosis Date  . H/O varicella   . Increased BMI 12/17/08  . Heart defect   . Stroke 05/29/2014    "weaker in right arm and speech problems since" (07/09/2014)    Past Surgical History  Procedure Laterality Date  . Laparoscopic cholecystectomy  06/20/2012  . Radiology with anesthesia N/A 05/29/2014    Procedure: RADIOLOGY WITH ANESTHESIA;  Surgeon: Oneal Grout, MD;  Location: [facility] OR;  Service: Radiology;  Laterality: N/A;  . Tee without cardioversion N/A 06/03/2014    Procedure: TRANSESOPHAGEAL ECHOCARDIOGRAM (TEE);  Surgeon: Wendall Stade, MD;  Location: [facility] ENDOSCOPY;  Service: Cardiovascular;  Laterality: N/A;  . Patent foramen ovale closure  07/09/2014  . Patent foramen ovale closure N/A 07/09/2014    Procedure: PATENT FORAMEN OVALE CLOSURE;  Surgeon: Micheline Chapman, MD;  Location: [facility] CATH LAB;  Service: Cardiovascular;  Laterality: N/A;    There were no vitals filed for this visit.  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 - Plan: SLP plan of care cert/re-cert      Subjective Assessment - 08/06/14 1324    Symptoms "Before I was no no no after the stroke, then I started progessing"               ADULT SLP TREATMENT - 08/06/14 1325    General Information   Behavior/Cognition Alert;Cooperative;Pleasant mood   Treatment Provided    Treatment provided Cognitive-Linquistic   Pain Assessment   Pain Assessment No/denies pain   Cognitive-Linquistic Treatment   Treatment focused on Apraxia;Aphasia   Skilled Treatment Pt generated sentences with given multiple meaning sentences - with consistent min to mod A for more othan one meaning. Sentence generation requires extended time .  Functional written expression filling out job application  with usual mod A for writing technical words such as areas of study . Pt verbalized frustration with this task.    Assessment / Recommendations / Plan   Plan Continue with current plan of care   Progression Toward Goals   Progression toward goals Progressing toward goals          SLP Education - 08/06/14 1459    Education provided Yes   Education Details compensations for written expression   Person(s) Educated Patient   Methods Explanation   Comprehension Verbalized understanding;Returned demonstration          SLP Short Term Goals - 08/06/14 1504    SLP SHORT TERM GOAL #1   Title Pt will perform simple convergent and divergent naming tasks with 85% accuracy and occassional minimal assistance (due 07/10/14)   Status Achieved   SLP SHORT TERM GOAL #2   Title Pt will demonstrate auditory comprehension of simple conversation and questions with 85% accuracy and occassional minimal assistance (07/10/14)   Status Achieved   SLP SHORT TERM  GOAL #3   Title Pt will id object to phrase/sentence description with 85% accuracy and rare minimal assistance (07/10/14)   Status Achieved   SLP SHORT TERM GOAL #4   Title ID object/picture to written description f:4 with 80% acccuracy and rare minimal assistance (07/10/14)   Status Achieved          SLP Long Term Goals - 08/06/14 1510    SLP LONG TERM GOAL #3   Baseline 09/07/14   Time 4   SLP LONG TERM GOAL #5   Baseline 09/07/14   Time 4   Period Weeks          Plan - 08/06/14 1509    Clinical Impression Statement Pt requiring  usual mod A for simple and functional written expression and reading comprehension. Recommned continue skilled ST to maximize linguistic skills and independence. Recommend recertification for 5 more weeks of ST. New goals written/updated. Recert order requested        Problem List Patient Active Problem List   Diagnosis Date Noted  . Morbid obesity 06/04/2014  . Dysphagia, pharyngoesophageal phase 06/04/2014  . PFO with atrial septal aneurysm 06/04/2014  . Hyperlipidemia LDL goal <70 06/04/2014  . Ischemic brain damage   . Acute respiratory failure with hypoxia   . CVA (cerebral vascular accident) 05/29/2014  . Aphasia complicating stroke 05/29/2014  . Hemiplegia affecting dominant side 05/29/2014  . CONSTIPATION 03/09/2008  . IRRITABLE BOWEL SYNDROME 03/09/2008    Kiana Hollar, Radene Journey, SLP 08/06/2014, 3:11 PM  [facility] [address] [place], [place], [address] Phone: [phone]   Fax:  [phone]

## 2014-08-06 NOTE — Telephone Encounter (Signed)
Patient mother calling wanting to know if the form was received. They left the form up here on last Wed.

## 2014-08-06 NOTE — Telephone Encounter (Signed)
Called patient's mother back and informed her that form was completed on Wednesday and returned back to Medical record, mother was informed that there was no charge for this form.

## 2014-08-07 NOTE — Telephone Encounter (Signed)
Form [employer] received,completed by Dr [name] and Andreas Blower faxed 08-07-14.

## 2014-08-09 NOTE — Progress Notes (Signed)
Cardiology Office Note   Date:  08/11/2014   ID:  Liba, Sibert 1976/05/12, MRN [id number]  PCP:  Marvel Plan, MD  Cardiologist:  Tonny Bollman, MD    Chief Complaint  Patient presents with  . Leg Pain    left leg      History of Present Illness: Rebecca Russo is a 39 y.o. female who presents for follow-up after transcatheter PFO closure 07/09/2014. She initially presented in January with a left MCA infarct and she underwent mechanical thrombectomy. During the course of her evaluation, there was no clear etiology of her stroke except for the presence of a large PFO and associated atrial septal aneurysm. Other extensive evaluation showed no evidence of a hypercoagulable disorder or any vascular disease.  The patient is currently doing well. She denies chest pain or shortness of breath. She denies leg swelling. She does report easy bruising since she's been on dual antiplatelet therapy with aspirin and Plavix. She continues to be frustrated with her expressive aphasia but there is slow improvement noted.   Past Medical History  Diagnosis Date  . H/O varicella   . Increased BMI 12/17/08  . Heart defect   . Stroke 05/29/2014    "weaker in right arm and speech problems since" (07/09/2014)  . Acute respiratory failure with hypoxia   . Aphasia complicating stroke   . Hemiplegia affecting dominant side   . Ischemic brain damage   . Heart defect     Past Surgical History  Procedure Laterality Date  . Laparoscopic cholecystectomy  06/20/2012  . Radiology with anesthesia N/A 05/29/2014    Procedure: RADIOLOGY WITH ANESTHESIA;  Surgeon: Oneal Grout, MD;  Location: [facility] OR;  Service: Radiology;  Laterality: N/A;  . Tee without cardioversion N/A 06/03/2014    Procedure: TRANSESOPHAGEAL ECHOCARDIOGRAM (TEE);  Surgeon: Wendall Stade, MD;  Location: [facility] ENDOSCOPY;  Service: Cardiovascular;  Laterality: N/A;  . Patent foramen ovale closure  07/09/2014  . Patent foramen ovale closure N/A  07/09/2014    Procedure: PATENT FORAMEN OVALE CLOSURE;  Surgeon: Micheline Chapman, MD;  Location: [facility] CATH LAB;  Service: Cardiovascular;  Laterality: N/A;    Current Outpatient Prescriptions  Medication Sig Dispense Refill  . aspirin 81 MG tablet Take 1 tablet (81 mg total) by mouth daily.    . clopidogrel (PLAVIX) 75 MG tablet Take 1 tablet (75 mg total) by mouth daily. 30 tablet 11  . rosuvastatin (CRESTOR) 5 MG tablet Take 1 tablet (5 mg total) by mouth daily at 6 PM. 30 tablet 5   No current facility-administered medications for this visit.    Allergies:   Review of patient's allergies indicates no known allergies.   Social History:  The patient  reports that she has never smoked. She has never used smokeless tobacco. She reports that she does not drink alcohol or use illicit drugs.   Family History:  The patient's  family history includes Hypertension in her father and mother.    ROS:  Please see the history of present illness.   All other systems are reviewed and negative.    PHYSICAL EXAM: VS:  BP 124/88 mmHg  Pulse 76  Ht 5\' 10"  (1.778 m)  Wt 314 lb (142.429 kg)  BMI 45.05 kg/m2  SpO2 98% , BMI Body mass index is 45.05 kg/(m^2). GEN: Well nourished, well developed, in no acute distress HEENT: normal Neck: no JVD, no masses. No carotid bruits Cardiac: RRR without murmur or gallop  Respiratory:  clear to auscultation bilaterally, normal work of breathing GI: soft, nontender, nondistended, + BS MS: no deformity or atrophy Ext: no pretibial edema, pedal pulses 2+= bilaterally Skin: warm and dry, no rash Neuro:  Strength and sensation are intact Psych: euthymic mood, full affect  EKG:  EKG is not ordered today.  Recent Labs: 05/31/2014: Magnesium 2.0 07/04/2014: ALT 13; B Natriuretic Peptide 20.6 07/10/2014: BUN <5*; Creatinine 0.72; Hemoglobin 9.8*; Platelets 362; Potassium 3.3*; Sodium 137   Lipid Panel     Component Value Date/Time   CHOL 173  05/30/2014 1200   TRIG 225* 05/30/2014 1200   HDL 31* 05/30/2014 1200   CHOLHDL 5.6 05/30/2014 1200   VLDL 45* 05/30/2014 1200   LDLCALC 97 05/30/2014 1200      Wt Readings from Last 3 Encounters:  08/11/14 314 lb (142.429 kg)  07/10/14 330 lb 11 oz (150 kg)  07/07/14 329 lb (149.233 kg)    ASSESSMENT AND PLAN: PFO status post transcatheter closure. The patient appears to be doing well. We will arrange a limited 2-D echocardiogram for her one-month study to rule out paracardial effusion and evaluate proper device placement. She will then be scheduled for a limited six-month agitated saline study to evaluate for residual shunt. The patient should continue dual antiplatelet therapy with aspirin and clopidogrel for a total of 6 months. I am awaiting FMLA paperwork which I will fill out as soon as it is available.  The patient is unable to work because of significant expressive aphasia.  Current medicines are reviewed with the patient today.  The patient does not have concerns regarding medicines.  The following changes have been made:  no change  Labs/ tests ordered today include:   Orders Placed This Encounter  Procedures  . 2D Echocardiogram with contrast  . 2D Echocardiogram with bubble study    Disposition:   FU 6 months with a limited 2D Echo with agitated saline same day as return visit.   Signed, Tonny Bollman, MD  08/11/2014 1:51 PM    [facility] [address] [place], [place], [place]  [address] Phone: [phone]; Fax: [phone]

## 2014-08-11 ENCOUNTER — Encounter: Payer: Self-pay | Admitting: Cardiovascular Disease

## 2014-08-11 ENCOUNTER — Ambulatory Visit: Payer: Managed Care, Other (non HMO)

## 2014-08-11 ENCOUNTER — Ambulatory Visit (INDEPENDENT_AMBULATORY_CARE_PROVIDER_SITE_OTHER): Payer: Managed Care, Other (non HMO) | Admitting: Cardiovascular Disease

## 2014-08-11 ENCOUNTER — Ambulatory Visit: Payer: Managed Care, Other (non HMO) | Admitting: Occupational Therapy

## 2014-08-11 ENCOUNTER — Encounter: Payer: Self-pay | Admitting: Occupational Therapy

## 2014-08-11 VITALS — BP 124/88 | HR 76 | Ht 70.0 in | Wt 314.0 lb

## 2014-08-11 DIAGNOSIS — Q211 Atrial septal defect: Secondary | ICD-10-CM | POA: Diagnosis not present

## 2014-08-11 DIAGNOSIS — R482 Apraxia: Secondary | ICD-10-CM

## 2014-08-11 DIAGNOSIS — Q2112 Patent foramen ovale: Secondary | ICD-10-CM

## 2014-08-11 DIAGNOSIS — I69320 Aphasia following cerebral infarction: Secondary | ICD-10-CM

## 2014-08-11 DIAGNOSIS — R449 Unspecified symptoms and signs involving general sensations and perceptions: Secondary | ICD-10-CM

## 2014-08-11 DIAGNOSIS — IMO0002 Reserved for concepts with insufficient information to code with codable children: Secondary | ICD-10-CM

## 2014-08-11 DIAGNOSIS — R4189 Other symptoms and signs involving cognitive functions and awareness: Secondary | ICD-10-CM

## 2014-08-11 DIAGNOSIS — I69359 Hemiplegia and hemiparesis following cerebral infarction affecting unspecified side: Secondary | ICD-10-CM

## 2014-08-11 MED ORDER — ASPIRIN 81 MG PO TABS
81.0000 mg | ORAL_TABLET | Freq: Every day | ORAL | Status: DC
Start: 2014-08-11 — End: 2015-02-02

## 2014-08-11 NOTE — Therapy (Signed)
[facility] [address] [place], [place], [address] Phone: [phone]   Fax:  [phone]  Speech Language Pathology Treatment  Patient Details  Name: Rebecca Russo MRN: [id number] Date of Birth: [birth year] Referring Provider:  Marvel Plan, MD  Encounter Date: 08/11/2014      End of Session - 08/11/14 1702    Date for SLP Re-Evaluation 09/10/14   SLP Start Time 1537   SLP Stop Time  1620   SLP Time Calculation (min) 43 min   Activity Tolerance Patient tolerated treatment well      Past Medical History  Diagnosis Date  . H/O varicella   . Increased BMI 12/17/08  . Heart defect   . Stroke 05/29/2014    "weaker in right arm and speech problems since" (07/09/2014)  . Acute respiratory failure with hypoxia   . Aphasia complicating stroke   . Hemiplegia affecting dominant side   . Ischemic brain damage   . Heart defect     Past Surgical History  Procedure Laterality Date  . Laparoscopic cholecystectomy  06/20/2012  . Radiology with anesthesia N/A 05/29/2014    Procedure: RADIOLOGY WITH ANESTHESIA;  Surgeon: Oneal Grout, MD;  Location: [facility] OR;  Service: Radiology;  Laterality: N/A;  . Tee without cardioversion N/A 06/03/2014    Procedure: TRANSESOPHAGEAL ECHOCARDIOGRAM (TEE);  Surgeon: Wendall Stade, MD;  Location: [facility] ENDOSCOPY;  Service: Cardiovascular;  Laterality: N/A;  . Patent foramen ovale closure  07/09/2014  . Patent foramen ovale closure N/A 07/09/2014    Procedure: PATENT FORAMEN OVALE CLOSURE;  Surgeon: Micheline Chapman, MD;  Location: [facility] CATH LAB;  Service: Cardiovascular;  Laterality: N/A;    There were no vitals filed for this visit.  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  Apraxia of speech      Subjective Assessment - 08/11/14 1544    Symptoms We went to church and then to my bishop[employer] house for -a - cookout."               ADULT SLP TREATMENT - 08/11/14 1546     General Information   Behavior/Cognition Alert;Pleasant mood;Cooperative   Treatment Provided   Treatment provided Cognitive-Linquistic   Pain Assessment   Pain Assessment No/denies pain   Cognitive-Linquistic Treatment   Treatment focused on Apraxia;Aphasia   Skilled Treatment Pt completed written expressive language tasks - single word in functional sentences- with 60% success, improved to 10% with max A (occasionally). Pt's simple conversation was functional with extra time allowed. With incr'd difficulty (explain difference between copay and coinsurance), performance deteriorated to requiring min-mod A.   Assessment / Recommendations / Plan   Plan Continue with current plan of care   Progression Toward Goals   Progression toward goals Progressing toward goals            SLP Short Term Goals - 08/11/14 1703    SLP SHORT TERM GOAL #1   Title Pt will perform simple convergent and divergent naming tasks with 85% accuracy and occassional minimal assistance (due 07/10/14)   Status Achieved   SLP SHORT TERM GOAL #2   Title Pt will demonstrate auditory comprehension of simple conversation and questions with 85% accuracy and occassional minimal assistance (07/10/14)   Status Achieved   SLP SHORT TERM GOAL #3   Title Pt will id object to phrase/sentence description with 85% accuracy and rare minimal assistance (07/10/14)   Status Achieved   SLP SHORT TERM GOAL #4  Title ID object/picture to written description f:4 with 80% acccuracy and rare minimal assistance (07/10/14)   Status Achieved          SLP Long Term Goals - 08/11/14 1703    SLP LONG TERM GOAL #1   Title Demonsrate compensations for aphasia during simple conversation 80% of opportunities and occassional minimal assistance (08/04/14)   Status Achieved   SLP LONG TERM GOAL #3   Title Comprehend simple 3-4 sentence paragraph with 80% accuracy and occassional min A. (08/04/14)   Baseline 09/07/14   Time 4   Period Weeks    Status On-going   SLP LONG TERM GOAL #5   Title pt will perform mod complex writing tasks with 70% success and occasinal min A   Baseline 09/10/14   Time 4   Period Weeks   Status On-going   SLP LONG TERM GOAL #6   Title pt to engage in 8 minutes mod complex conversation with occasional min A    Time 4   Period Weeks   Status New          Plan - 08/11/14 1700    Clinical Impression Statement Pt's simple conversation is [facility] with extra time, however with incr'd linguistic complexity (mod complex-complex), pt's ability deteriorates. Skilled ST rec to cont to maximize ability with language to return to independence with language skills.   Speech Therapy Frequency 2x / week   Duration 4 weeks   Treatment/Interventions Language facilitation;Cueing hierarchy;Functional tasks;Patient/family education;Compensatory strategies;SLP instruction and feedback   Potential to Achieve Goals Good   Potential Considerations Severity of impairments        Problem List Patient Active Problem List   Diagnosis Date Noted  . Morbid obesity 06/04/2014  . Dysphagia, pharyngoesophageal phase 06/04/2014  . PFO with atrial septal aneurysm 06/04/2014  . Hyperlipidemia LDL goal <70 06/04/2014  . Ischemic brain damage   . Acute respiratory failure with hypoxia   . CVA (cerebral vascular accident) 05/29/2014  . Aphasia complicating stroke 05/29/2014  . Hemiplegia affecting dominant side 05/29/2014  . CONSTIPATION 03/09/2008  . IRRITABLE BOWEL SYNDROME 03/09/2008    Verdie Mosher, SLP 08/11/2014, 5:06 PM  [facility] [address] [place], [place], [address] Phone: [phone]   Fax:  [phone]

## 2014-08-11 NOTE — Therapy (Signed)
[facility] [address] [place], [place], [address] Phone: [phone]   Fax:  [phone]  Occupational Therapy Treatment  Patient Details  Name: Rebecca Russo MRN: [id number] Date of Birth: 11-Aug-1975 Referring Provider:  Marvel Plan, MD  Encounter Date: 08/11/2014      OT End of Session - 08/11/14 2023    Visit Number 7   Number of Visits 16   Date for OT Re-Evaluation 08/20/14   Authorization Type [employer]   OT Start Time 1447   OT Stop Time 1530   OT Time Calculation (min) 43 min   Activity Tolerance Patient tolerated treatment well   Behavior During Therapy [facility] for tasks assessed/performed      Past Medical History  Diagnosis Date  . H/O varicella   . Increased BMI 12/17/08  . Heart defect   . Stroke 05/29/2014    "weaker in right arm and speech problems since" (07/09/2014)  . Acute respiratory failure with hypoxia   . Aphasia complicating stroke   . Hemiplegia affecting dominant side   . Ischemic brain damage   . Heart defect     Past Surgical History  Procedure Laterality Date  . Laparoscopic cholecystectomy  06/20/2012  . Radiology with anesthesia N/A 05/29/2014    Procedure: RADIOLOGY WITH ANESTHESIA;  Surgeon: Oneal Grout, MD;  Location: [facility] OR;  Service: Radiology;  Laterality: N/A;  . Tee without cardioversion N/A 06/03/2014    Procedure: TRANSESOPHAGEAL ECHOCARDIOGRAM (TEE);  Surgeon: Wendall Stade, MD;  Location: [facility] ENDOSCOPY;  Service: Cardiovascular;  Laterality: N/A;  . Patent foramen ovale closure  07/09/2014  . Patent foramen ovale closure N/A 07/09/2014    Procedure: PATENT FORAMEN OVALE CLOSURE;  Surgeon: Micheline Chapman, MD;  Location: [facility] CATH LAB;  Service: Cardiovascular;  Laterality: N/A;    There were no vitals filed for this visit.  Visit Diagnosis:  Hemiparesis affecting dominant side as late effect of cerebrovascular accident  Sensory deficit, right  Lack of  coordination due to stroke      Subjective Assessment - 08/11/14 2013    Symptoms I didn't cook.  Patient indicating her schedule last weekend was too busy to attempt cooking   Pertinent History see snapshot, CVA, PFO   Limitations Aphasia, expressive>receptive   Patient Stated Goals Use my right hand!   Currently in Pain? No/denies   Pain Score 0-No pain                    OT Treatments/Exercises (OP) - 08/11/14 0001    ADLs   Cooking Discussed timing of cooking several items simultaneously to complete by a target time; e.g. dinner of shrimp, rice, and asparagus.  Patient able to verbally explain each step of process with minimal cueing.  Will attempt to cook meal at home with supervision from family member.   Writing Typing task.  Patient unable to type without looking at keyboard.  Patient was proficient with typing prior to stroke.  Patient limited more by language component of identifying symbols in a meaningful way.  Patient able to type full sentence with minimal cueing when she faltered.  Patient better able to copy simple text at sentence level.  Encouraged patient to copy simple text at home to improve coordination with keyboarding skills   ADL Comments Patient expressing frustration at language challenges today.  Patient utilizing right hand appropriately and spontaneously 90% time today.  OT Education - 08/11/14 2022    Education provided Yes   Education Details Patient encouraged to prepare a simple meal in which several items completing simultaneously - with supervision provided by family member   Person(s) Educated Patient   Methods Explanation   Comprehension Verbalized understanding          OT Short Term Goals - 08/03/14 1732    OT SHORT TERM GOAL #1   Title Patient will utilize her cell phone to call a family member / friend  using her right hand to "dial" - 07/27/2014   Time 4   Period Weeks   Status Achieved   OT SHORT TERM  GOAL #2   Title Patient will legibly write at the single word level using her right hand with AE as needed   Time 4   Period Weeks   Status Achieved   OT SHORT TERM GOAL #3   Title Patient will demonstrate beginning keyboarding skills to type incorporating bilateral upper extremities   Time 4   Period Weeks   Status Achieved   OT SHORT TERM GOAL #4   Title Patient will demonstrate adequate strength and coordination in right hand to fasten seat belt in car.    Time 4   Period Weeks   Status Achieved   OT SHORT TERM GOAL #5   Title Patient will complete simple meal prep (cold) with verbal cueing only   Status On-going           OT Long Term Goals - 08/03/14 1733    OT LONG TERM GOAL #1   Title Patient will effectively don/ doff earrings or necklace using both hands -4/08/21/2014   Time 8   Period Weeks   Status Achieved   OT LONG TERM GOAL #2   Title Patient will legibly write at the sentence level using her right hand with AE as needed   Time 8   Period Weeks   Status On-going   OT LONG TERM GOAL #3   Title Patient will apply makeup as for church using bilateral upper extremities, with right hand as dominant.   Time 8   Period Weeks   Status On-going   OT LONG TERM GOAL #4   Title Patient will demonstrate adequate coordination with keyboarding skills to send simple email to colleague or friend using bilateral upper extremities   Time 8   Period Weeks   Status On-going   OT LONG TERM GOAL #5   Title Patient will effectively pay bills and balance checking account with modified independence.     Status On-going   OT LONG TERM GOAL #6   Title Patient will prepare simple family meal utilizing stovetop and/or oven with modified independence   Status On-going               Plan - 08/11/14 2027    Plan cooking activity, financial management - bill paying skills        Problem List Patient Active Problem List   Diagnosis Date Noted  . Morbid obesity  06/04/2014  . Dysphagia, pharyngoesophageal phase 06/04/2014  . PFO with atrial septal aneurysm 06/04/2014  . Hyperlipidemia LDL goal <70 06/04/2014  . Ischemic brain damage   . Acute respiratory failure with hypoxia   . CVA (cerebral vascular accident) 05/29/2014  . Aphasia complicating stroke 05/29/2014  . Hemiplegia affecting dominant side 05/29/2014  . CONSTIPATION 03/09/2008  . IRRITABLE BOWEL SYNDROME 03/09/2008    Collier Salina 08/11/2014, 8:28  PM  [facility] [address] [place], [place], [address] Phone: [phone]   Fax:  [phone]

## 2014-08-11 NOTE — Patient Instructions (Addendum)
Your physician has recommended you make the following change in your medication:   Decrease Aspirin 81 mg by mouth daily.   Your physician has requested that you have an  Limited echocardiogram now. Echocardiography is a painless test that uses sound waves to create images of your heart. It provides your doctor with information about the size and shape of your heart and how well your heart's chambers and valves are working. This procedure takes approximately one hour. There are no restrictions for this procedure.  Your physician has requested that you have an Limited echocardiogram with bubble study in 6 months. Echocardiography is a painless test that uses sound waves to create images of your heart. It provides your doctor with information about the size and shape of your heart and how well your heart's chambers and valves are working. This procedure takes approximately one hour. There are no restrictions for this procedure.  Your physician wants you to follow-up in: 6 months with Dr. Excell [place] on same day of echocardiogram. You will receive a reminder letter in the mail two months in advance. If you don't receive a letter, please call our office to schedule the follow-up appointment.

## 2014-08-12 ENCOUNTER — Ambulatory Visit: Payer: Managed Care, Other (non HMO)

## 2014-08-12 DIAGNOSIS — R482 Apraxia: Secondary | ICD-10-CM

## 2014-08-12 DIAGNOSIS — I69320 Aphasia following cerebral infarction: Secondary | ICD-10-CM | POA: Diagnosis not present

## 2014-08-12 NOTE — Therapy (Signed)
[facility] [address] [place], [place], [address] Phone: [phone]   Fax:  [phone]  Speech Language Pathology Treatment  Patient Details  Name: Rebecca Russo MRN: [id number] Date of Birth: 03-Dec-1975 Referring Provider:  Marvel Plan, MD  Encounter Date: 08/12/2014      End of Session - 08/12/14 1549    Visit Number 14   Number of Visits 24   Date for SLP Re-Evaluation 09/10/14   SLP Start Time 1404   SLP Stop Time  1445   SLP Time Calculation (min) 41 min   Activity Tolerance Patient tolerated treatment well      Past Medical History  Diagnosis Date  . H/O varicella   . Increased BMI 12/17/08  . Heart defect   . Stroke 05/29/2014    "weaker in right arm and speech problems since" (07/09/2014)  . Acute respiratory failure with hypoxia   . Aphasia complicating stroke   . Hemiplegia affecting dominant side   . Ischemic brain damage   . Heart defect     Past Surgical History  Procedure Laterality Date  . Laparoscopic cholecystectomy  06/20/2012  . Radiology with anesthesia N/A 05/29/2014    Procedure: RADIOLOGY WITH ANESTHESIA;  Surgeon: Oneal Grout, MD;  [facility] OR;  Service: Radiology;  Laterality: N/A;  . Tee without cardioversion N/A 06/03/2014    Procedure: TRANSESOPHAGEAL ECHOCARDIOGRAM (TEE);  Surgeon: Wendall Stade, MD;  Location: [facility] ENDOSCOPY;  Service: Cardiovascular;  Laterality: N/A;  . Patent foramen ovale closure  07/09/2014  . Patent foramen ovale closure N/A 07/09/2014    Procedure: PATENT FORAMEN OVALE CLOSURE;  Surgeon: Micheline Chapman, MD;  Location: [facility] CATH LAB;  Service: Cardiovascular;  Laterality: N/A;    There were no vitals filed for this visit.  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  Apraxia of speech      Subjective Assessment - 08/11/14 1544    Symptoms We went to church and then to my bishop's house for -a - cookout."                ADULT SLP TREATMENT - 08/12/14 1437    General Information   Behavior/Cognition Alert;Pleasant mood;Cooperative   Treatment Provided   Treatment provided Cognitive-Linquistic   Pain Assessment   Pain Assessment No/denies pain   Cognitive-Linquistic Treatment   Treatment focused on Apraxia;Aphasia   Skilled Treatment Conversation outside simple-mod complex completed with rare mod A  and consistently extra time for message conveyance. In written tasks, pt req'd rare mod A   Assessment / Recommendations / Plan   Plan Continue with current plan of care   Progression Toward Goals   Progression toward goals Progressing toward goals            SLP Short Term Goals - 08/12/14 1552    SLP SHORT TERM GOAL #1   Title Pt will perform simple convergent and divergent naming tasks with 85% accuracy and occassional minimal assistance (due 07/10/14)   Status Achieved   SLP SHORT TERM GOAL #2   Title Pt will demonstrate auditory comprehension of simple conversation and questions with 85% accuracy and occassional minimal assistance (07/10/14)   Status Achieved   SLP SHORT TERM GOAL #3   Title Pt will id object to phrase/sentence description with 85% accuracy and rare minimal assistance (07/10/14)   Status Achieved   SLP SHORT TERM GOAL #4   Title ID object/picture to written description f:4 with 80%  acccuracy and rare minimal assistance (07/10/14)   Status Achieved          SLP Long Term Goals - 08/12/14 1551    SLP LONG TERM GOAL #1   Title Demonsrate compensations for aphasia during simple conversation 80% of opportunities and occassional minimal assistance (08/04/14)   Status Achieved   SLP LONG TERM GOAL #3   Title Comprehend simple 3-4 sentence paragraph with 80% accuracy and occassional min A. (08/04/14)   Baseline 09/07/14   Time 4   Period Weeks   Status On-going   SLP LONG TERM GOAL #5   Title pt will perform mod complex writing tasks with 70% success and  occasinal min A   Baseline 09/10/14   Time 4   Period Weeks   Status On-going   SLP LONG TERM GOAL #6   Title pt to engage in 8 minutes mod complex conversation with occasional min A    Time 4   Period Weeks   Status On-going          Plan - 08/12/14 1549    Clinical Impression Statement Pt's mod complex conversation req'd rare mod A from SLP today.   Speech Therapy Frequency 2x / week   Duration 4 weeks   Treatment/Interventions Language facilitation;Cueing hierarchy;Functional tasks;Patient/family education;Compensatory strategies;SLP instruction and feedback   Potential to Achieve Goals Good   Potential Considerations Severity of impairments        Problem List Patient Active Problem List   Diagnosis Date Noted  . Morbid obesity 06/04/2014  . Dysphagia, pharyngoesophageal phase 06/04/2014  . PFO with atrial septal aneurysm 06/04/2014  . Hyperlipidemia LDL goal <70 06/04/2014  . Ischemic brain damage   . Acute respiratory failure with hypoxia   . CVA (cerebral vascular accident) 05/29/2014  . Aphasia complicating stroke 05/29/2014  . Hemiplegia affecting dominant side 05/29/2014  . CONSTIPATION 03/09/2008  . IRRITABLE BOWEL SYNDROME 03/09/2008    Verdie Mosher, SLP 08/12/2014, 3:55 PM  [facility] [address] [place], [place], [address] Phone: [phone]   Fax:  [phone]

## 2014-08-14 ENCOUNTER — Ambulatory Visit: Payer: Managed Care, Other (non HMO)

## 2014-08-14 ENCOUNTER — Ambulatory Visit (HOSPITAL_COMMUNITY): Payer: Managed Care, Other (non HMO) | Attending: Cardiovascular Disease | Admitting: Cardiology

## 2014-08-14 DIAGNOSIS — Q211 Atrial septal defect: Secondary | ICD-10-CM | POA: Insufficient documentation

## 2014-08-14 DIAGNOSIS — Q2112 Patent foramen ovale: Secondary | ICD-10-CM

## 2014-08-14 NOTE — Progress Notes (Signed)
Limited echo performed. 

## 2014-08-18 ENCOUNTER — Encounter: Payer: Self-pay | Admitting: Occupational Therapy

## 2014-08-18 ENCOUNTER — Ambulatory Visit: Payer: Managed Care, Other (non HMO) | Admitting: Occupational Therapy

## 2014-08-18 ENCOUNTER — Ambulatory Visit: Payer: Managed Care, Other (non HMO) | Attending: Neurology | Admitting: Speech Pathology

## 2014-08-18 DIAGNOSIS — R449 Unspecified symptoms and signs involving general sensations and perceptions: Secondary | ICD-10-CM

## 2014-08-18 DIAGNOSIS — I69391 Dysphagia following cerebral infarction: Secondary | ICD-10-CM | POA: Diagnosis not present

## 2014-08-18 DIAGNOSIS — IMO0002 Reserved for concepts with insufficient information to code with codable children: Secondary | ICD-10-CM

## 2014-08-18 DIAGNOSIS — I69390 Apraxia following cerebral infarction: Secondary | ICD-10-CM | POA: Diagnosis not present

## 2014-08-18 DIAGNOSIS — I69320 Aphasia following cerebral infarction: Secondary | ICD-10-CM | POA: Insufficient documentation

## 2014-08-18 DIAGNOSIS — R4189 Other symptoms and signs involving cognitive functions and awareness: Secondary | ICD-10-CM

## 2014-08-18 DIAGNOSIS — I69359 Hemiplegia and hemiparesis following cerebral infarction affecting unspecified side: Secondary | ICD-10-CM

## 2014-08-18 NOTE — Patient Instructions (Signed)
  Read article in paper or magazine and give ST a verbal summary of the article  Correct menu

## 2014-08-18 NOTE — Therapy (Signed)
[facility] [address] [place], [place], [address] Phone: [phone]   Fax:  [phone]  Occupational Therapy Treatment  Patient Details  Name: Rebecca Russo MRN: [id number] Date of Birth: 10/16/1975 Referring Provider:  Marvel Plan, MD  Encounter Date: 08/18/2014      OT End of Session - 08/18/14 1721    Visit Number 8   Number of Visits 16   Date for OT Re-Evaluation 08/20/14   Authorization Type [employer]   OT Start Time 1445   OT Stop Time 1532   OT Time Calculation (min) 47 min   Activity Tolerance Patient tolerated treatment well   Behavior During Therapy [facility] for tasks assessed/performed      Past Medical History  Diagnosis Date  . H/O varicella   . Increased BMI 12/17/08  . Heart defect   . Stroke 05/29/2014    "weaker in right arm and speech problems since" (07/09/2014)  . Acute respiratory failure with hypoxia   . Aphasia complicating stroke   . Hemiplegia affecting dominant side   . Ischemic brain damage   . Heart defect     Past Surgical History  Procedure Laterality Date  . Laparoscopic cholecystectomy  06/20/2012  . Radiology with anesthesia N/A 05/29/2014    Procedure: RADIOLOGY WITH ANESTHESIA;  Surgeon: Oneal Grout, MD;  Location: [facility] OR;  Service: Radiology;  Laterality: N/A;  . Tee without cardioversion N/A 06/03/2014    Procedure: TRANSESOPHAGEAL ECHOCARDIOGRAM (TEE);  Surgeon: Wendall Stade, MD;  Location: [facility] ENDOSCOPY;  Service: Cardiovascular;  Laterality: N/A;  . Patent foramen ovale closure  07/09/2014  . Patent foramen ovale closure N/A 07/09/2014    Procedure: PATENT FORAMEN OVALE CLOSURE;  Surgeon: Micheline Chapman, MD;  Location: [facility] CATH LAB;  Service: Cardiovascular;  Laterality: N/A;    There were no vitals filed for this visit.  Visit Diagnosis:  Hemiparesis affecting dominant side as late effect of cerebrovascular accident  Sensory deficit, right  Lack of coordination  due to stroke      Subjective Assessment - 08/18/14 1520    Subjective  I cooked cheesburger egg rolls - they were good!   Pertinent History see snapshot, CVA, PFO- repaired   Limitations Aphasia, expressive>receptive   Currently in Pain? No/denies   Pain Score 0-No pain                    OT Treatments/Exercises (OP) - 08/18/14 0001    ADLs   LB Dressing able to tie shoes with both hands for the first time today   Cooking Patient has begun cooking easy meals for her boyfriend and son.  .     Financial Management Patient has been paying bills on line independently   Driving able to drive, fasten/unfasten seat belts without difficulty   Cognitive Exercises   Financial Management Basic able to do simple addition and subtraction as related to checking account management   Handwriting able to write at single word and sentence level   Fine Motor Coordination   In Hand Manipulation Training rotating items, throwing catching, grooved pegboard, shuffling cards with using left hand as model first and then intermittent cueing.                OT Education - 08/18/14 1720    Education provided Yes   Education Details Encouraged to practice fine motor skills with right hand - dealing cards, manipulating small items, etc.   Person(s) Educated  Patient   Methods Explanation;Demonstration   Comprehension Verbalized understanding;Returned demonstration          OT Short Term Goals - 08/18/14 1723    OT SHORT TERM GOAL #1   Title Patient will utilize her cell phone to call a family member / friend  using her right hand to "dial" - 07/27/2014   Time 4   Period Weeks   Status Achieved   OT SHORT TERM GOAL #2   Title Patient will legibly write at the single word level using her right hand with AE as needed   Time 4   Period Weeks   Status Achieved   OT SHORT TERM GOAL #3   Title Patient will demonstrate beginning keyboarding skills to type incorporating bilateral upper  extremities   Time 4   Period Weeks   Status Achieved   OT SHORT TERM GOAL #4   Title Patient will demonstrate adequate strength and coordination in right hand to fasten seat belt in car.    Time 4   Period Weeks   Status Achieved   OT SHORT TERM GOAL #5   Title Patient will complete simple meal prep (cold) with verbal cueing only   Time 4   Period Weeks   Status Achieved           OT Long Term Goals - 08/18/14 1723    OT LONG TERM GOAL #1   Title Patient will effectively don/ doff earrings or necklace using both hands -4/08/21/2014   Time 8   Period Weeks   Status Achieved  earings achieved, necklace on going   OT LONG TERM GOAL #2   Title Patient will legibly write at the sentence level using her right hand with AE as needed   Time 8   Period Weeks   Status Achieved   OT LONG TERM GOAL #3   Title Patient will apply makeup as for church using bilateral upper extremities, with right hand as dominant.   Time 8   Period Weeks   Status On-going   OT LONG TERM GOAL #4   Title Patient will demonstrate adequate coordination with keyboarding skills to send simple email to colleague or friend using bilateral upper extremities   Time 8   Period Weeks   Status On-going   OT LONG TERM GOAL #5   Title Patient will effectively pay bills and balance checking account with modified independence.     Period Weeks   Status Achieved   OT LONG TERM GOAL #6   Title Patient will prepare simple family meal utilizing stovetop and/or oven with modified independence   Time 8   Period Weeks   Status Achieved               Plan - 08/18/14 1722    Clinical Impression Statement Patient continues to show functional improvement and progression toward her long term goals.     Pt will benefit from skilled therapeutic intervention in order to improve on the following deficits (Retired) Decreased cognition;Decreased coordination;Decreased strength;Impaired UE functional use;Impaired  sensation   Rehab Potential Excellent   Clinical Impairments Affecting Rehab Potential global aphasia   OT Frequency 2x / week   OT Duration 8 weeks   OT Treatment/Interventions Self-care/ADL training;Therapeutic exercise;Neuromuscular education;DME and/or AE instruction;Therapeutic exercises;Therapeutic activities;Cognitive remediation/compensation;Patient/family education   Plan patient will bring in eye liner, foundation, bracelet, and necklace to allow [place] to assess difficulty with these tasks   OT Home Exercise Plan ongoing  Consulted and Agree with Plan of Care Patient        Problem List Patient Active Problem List   Diagnosis Date Noted  . Morbid obesity 06/04/2014  . Dysphagia, pharyngoesophageal phase 06/04/2014  . PFO with atrial septal aneurysm 06/04/2014  . Hyperlipidemia LDL goal <70 06/04/2014  . Ischemic brain damage   . Acute respiratory failure with hypoxia   . CVA (cerebral vascular accident) 05/29/2014  . Aphasia complicating stroke 05/29/2014  . Hemiplegia affecting dominant side 05/29/2014  . CONSTIPATION 03/09/2008  . IRRITABLE BOWEL SYNDROME 03/09/2008    Collier Salina, OTR/L 08/18/2014, 5:25 PM  [facility] [address] [place], [place], [address] Phone: [phone]   Fax:  [phone]

## 2014-08-18 NOTE — Therapy (Signed)
[facility] [address] [place], [place], [address] Phone: [phone]   Fax:  [phone]  Speech Language Pathology Treatment  Patient Details  Name: Rebecca Russo MRN: [id number] Date of Birth: [birth year] Referring Provider:  Marvel Plan, MD  Encounter Date: 08/18/2014      End of Session - 08/18/14 1443    Visit Number 15   Number of Visits 24   Date for SLP Re-Evaluation 09/10/14   SLP Start Time 1405   SLP Stop Time  1444   SLP Time Calculation (min) 39 min   Activity Tolerance Patient tolerated treatment well      Past Medical History  Diagnosis Date  . H/O varicella   . Increased BMI 12/17/08  . Heart defect   . Stroke 05/29/2014    "weaker in right arm and speech problems since" (07/09/2014)  . Acute respiratory failure with hypoxia   . Aphasia complicating stroke   . Hemiplegia affecting dominant side   . Ischemic brain damage   . Heart defect     Past Surgical History  Procedure Laterality Date  . Laparoscopic cholecystectomy  06/20/2012  . Radiology with anesthesia N/A 05/29/2014    Procedure: RADIOLOGY WITH ANESTHESIA;  Surgeon: Oneal Grout, MD;  Location: [facility] OR;  Service: Radiology;  Laterality: N/A;  . Tee without cardioversion N/A 06/03/2014    Procedure: TRANSESOPHAGEAL ECHOCARDIOGRAM (TEE);  Surgeon: Wendall Stade, MD;  Location: [facility] ENDOSCOPY;  Service: Cardiovascular;  Laterality: N/A;  . Patent foramen ovale closure  07/09/2014  . Patent foramen ovale closure N/A 07/09/2014    Procedure: PATENT FORAMEN OVALE CLOSURE;  Surgeon: Micheline Chapman, MD;  Location: [facility] CATH LAB;  Service: Cardiovascular;  Laterality: N/A;    There were no vitals filed for this visit.  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      Subjective Assessment - 08/18/14 1407    Subjective "I didn't bring it" (her homework)               ADULT SLP TREATMENT - 08/18/14 1408     General Information   Behavior/Cognition Alert;Pleasant mood;Cooperative   Treatment Provided   Treatment provided Cognitive-Linquistic   Pain Assessment   Pain Assessment No/denies pain   Cognitive-Linquistic Treatment   Treatment focused on Aphasia   Skilled Treatment Reading comprehension 3 paragraph informative stories with extended time, use of finger to follow along and usual re-reading of lower frequency words. Pt consistently aware of her errors and self corrects. Pt comprehnded  3 paragraphs with supervision cues. Generate simple written 2-3 sentece paragraph with extended time (thank you note, room description) Pt is aware of her errors but requries usual min to mod A to correct them. Pt to read article in magazine at home and verbally explain the main ideas/details to ST for home work.    Assessment / Recommendations / Plan   Plan Continue with current plan of care   Progression Toward Goals   Progression toward goals Progressing toward goals          SLP Education - 08/18/14 1442    Education provided No          SLP Short Term Goals - 08/18/14 1451    SLP SHORT TERM GOAL #1   Title Pt will perform simple convergent and divergent naming tasks with 85% accuracy and occassional minimal assistance (due 07/10/14)   Status Achieved   SLP SHORT TERM GOAL #2  Title Pt will demonstrate auditory comprehension of simple conversation and questions with 85% accuracy and occassional minimal assistance (07/10/14)   Status Achieved   SLP SHORT TERM GOAL #3   Title Pt will id object to phrase/sentence description with 85% accuracy and rare minimal assistance (07/10/14)   Status Achieved   SLP SHORT TERM GOAL #4   Title ID object/picture to written description f:4 with 80% acccuracy and rare minimal assistance (07/10/14)   Status Achieved          SLP Long Term Goals - 08/18/14 1451    SLP LONG TERM GOAL #1   Title Demonsrate compensations for aphasia during simple conversation  80% of opportunities and occassional minimal assistance (08/04/14)   Status Achieved   SLP LONG TERM GOAL #3   Title Comprehend simple 3-4 sentence paragraph with 80% accuracy and occassional min A. (08/04/14)   Baseline 09/07/14   Time 4   Period Weeks   Status On-going   SLP LONG TERM GOAL #4   Time 3   SLP LONG TERM GOAL #5   Title pt will perform mod complex writing tasks with 70% success and occasinal min A   Baseline 09/10/14   Time 4   Period Weeks   Status On-going   SLP LONG TERM GOAL #6   Title pt to engage in 8 minutes mod complex conversation with occasional min A    Time 4   Period Weeks   Status New          Plan - 08/18/14 1445    Clinical Impression Statement Multi paragraph level comprehension required extended time and supervision cues. Written expression for simple letter and descriptions with usual mod A. Continue skilled ST to maximize verbal, written expression and reading comprehension.   Speech Therapy Frequency 2x / week   Duration 4 weeks   Treatment/Interventions Language facilitation;Cueing hierarchy;Functional tasks;Patient/family education;Compensatory strategies;SLP instruction and feedback   Potential to Achieve Goals Good        Problem List Patient Active Problem List   Diagnosis Date Noted  . Morbid obesity 06/04/2014  . Dysphagia, pharyngoesophageal phase 06/04/2014  . PFO with atrial septal aneurysm 06/04/2014  . Hyperlipidemia LDL goal <70 06/04/2014  . Ischemic brain damage   . Acute respiratory failure with hypoxia   . CVA (cerebral vascular accident) 05/29/2014  . Aphasia complicating stroke 05/29/2014  . Hemiplegia affecting dominant side 05/29/2014  . CONSTIPATION 03/09/2008  . IRRITABLE BOWEL SYNDROME 03/09/2008    Tanishia Lemaster, Radene Journey, SLP 08/18/2014, 2:53 PM  [facility] [address] [place], [place], [address] Phone: [phone]   Fax:  [phone]

## 2014-08-19 ENCOUNTER — Ambulatory Visit: Payer: Managed Care, Other (non HMO)

## 2014-08-19 DIAGNOSIS — R482 Apraxia: Secondary | ICD-10-CM

## 2014-08-19 DIAGNOSIS — I69320 Aphasia following cerebral infarction: Secondary | ICD-10-CM | POA: Diagnosis not present

## 2014-08-19 NOTE — Therapy (Signed)
[facility] [address] [place], [place], [address] Phone: [phone]   Fax:  [phone]  Speech Language Pathology Treatment  Patient Details  Name: Rebecca Russo MRN: [id number] Date of Birth: 12/08/1975 Referring Provider:  Marvel Plan, MD  Encounter Date: 08/19/2014      End of Session - 08/19/14 1505    Visit Number 16   Number of Visits 24   Date for SLP Re-Evaluation 09/10/14   SLP Start Time 1403   SLP Stop Time  1449   SLP Time Calculation (min) 46 min   Activity Tolerance Patient tolerated treatment well      Past Medical History  Diagnosis Date  . H/O varicella   . Increased BMI 12/17/08  . Heart defect   . Stroke 05/29/2014    "weaker in right arm and speech problems since" (07/09/2014)  . Acute respiratory failure with hypoxia   . Aphasia complicating stroke   . Hemiplegia affecting dominant side   . Ischemic brain damage   . Heart defect     Past Surgical History  Procedure Laterality Date  . Laparoscopic cholecystectomy  06/20/2012  . Radiology with anesthesia N/A 05/29/2014    Procedure: RADIOLOGY WITH ANESTHESIA;  Surgeon: Oneal Grout, MD;  Location: [facility] OR;  Service: Radiology;  Laterality: N/A;  . Tee without cardioversion N/A 06/03/2014    Procedure: TRANSESOPHAGEAL ECHOCARDIOGRAM (TEE);  Surgeon: Wendall Stade, MD;  Location: [facility] ENDOSCOPY;  Service: Cardiovascular;  Laterality: N/A;  . Patent foramen ovale closure  07/09/2014  . Patent foramen ovale closure N/A 07/09/2014    Procedure: PATENT FORAMEN OVALE CLOSURE;  Surgeon: Micheline Chapman, MD;  Location: [facility] CATH LAB;  Service: Cardiovascular;  Laterality: N/A;    There were no vitals filed for this visit.  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  Apraxia of speech      Subjective Assessment - 08/19/14 1405    Subjective "My son came --with me today."               ADULT SLP TREATMENT -  08/19/14 1407    General Information   Behavior/Cognition Alert;Pleasant mood;Cooperative   Treatment Provided   Treatment provided Dysphagia;Cognitive-Linquistic   Pain Assessment   Pain Assessment No/denies pain   Cognitive-Linquistic Treatment   Treatment focused on Aphasia   Skilled Treatment Pt talked about homework with SLP with standby A. Pt generated sentences summarizing read selections with usual mod A. Pt aware of errors approx 85% of the time.   Assessment / Recommendations / Plan   Plan Continue with current plan of care   Progression Toward Goals   Progression toward goals Progressing toward goals          SLP Education - 08/18/14 1442    Education provided No          SLP Short Term Goals - 08/18/14 1451    SLP SHORT TERM GOAL #1   Title Pt will perform simple convergent and divergent naming tasks with 85% accuracy and occassional minimal assistance (due 07/10/14)   Status Achieved   SLP SHORT TERM GOAL #2   Title Pt will demonstrate auditory comprehension of simple conversation and questions with 85% accuracy and occassional minimal assistance (07/10/14)   Status Achieved   SLP SHORT TERM GOAL #3   Title Pt will id object to phrase/sentence description with 85% accuracy and rare minimal assistance (07/10/14)   Status Achieved   SLP SHORT TERM  GOAL #4   Title ID object/picture to written description f:4 with 80% acccuracy and rare minimal assistance (07/10/14)   Status Achieved          SLP Long Term Goals - 08/18/14 1451    SLP LONG TERM GOAL #1   Title Demonsrate compensations for aphasia during simple conversation 80% of opportunities and occassional minimal assistance (08/04/14)   Status Achieved   SLP LONG TERM GOAL #3   Title Comprehend simple 3-4 sentence paragraph with 80% accuracy and occassional min A. (08/04/14)   Baseline 09/07/14   Time 4   Period Weeks   Status On-going   SLP LONG TERM GOAL #4   Time 3   SLP LONG TERM GOAL #5   Title pt  will perform mod complex writing tasks with 70% success and occasinal min A   Baseline 09/10/14   Time 4   Period Weeks   Status On-going   SLP LONG TERM GOAL #6   Title pt to engage in 8 minutes mod complex conversation with occasional min A    Time 4   Period Weeks   Status New          Plan - 08/19/14 1505    Clinical Impression Statement Usual mod A cont needed for written sentence information. Skilled ST to cont to maximize expressive language in order to improve ability to communicate.   Speech Therapy Frequency 2x / week   Duration 4 weeks   Treatment/Interventions Language facilitation;Cueing hierarchy;Functional tasks;Patient/family education;Compensatory strategies;SLP instruction and feedback   Potential to Achieve Goals Good   Potential Considerations Severity of impairments        Problem List Patient Active Problem List   Diagnosis Date Noted  . Morbid obesity 06/04/2014  . Dysphagia, pharyngoesophageal phase 06/04/2014  . PFO with atrial septal aneurysm 06/04/2014  . Hyperlipidemia LDL goal <70 06/04/2014  . Ischemic brain damage   . Acute respiratory failure with hypoxia   . CVA (cerebral vascular accident) 05/29/2014  . Aphasia complicating stroke 05/29/2014  . Hemiplegia affecting dominant side 05/29/2014  . CONSTIPATION 03/09/2008  . IRRITABLE BOWEL SYNDROME 03/09/2008    Verdie Mosher, SLP 08/19/2014, 3:09 PM  [place] [facility] [address] [place], [place], [address] Phone: [phone]   Fax:  [phone]

## 2014-08-21 ENCOUNTER — Encounter: Payer: Self-pay | Admitting: Occupational Therapy

## 2014-08-21 ENCOUNTER — Ambulatory Visit: Payer: Managed Care, Other (non HMO) | Admitting: Occupational Therapy

## 2014-08-21 ENCOUNTER — Ambulatory Visit: Payer: Managed Care, Other (non HMO)

## 2014-08-21 DIAGNOSIS — I69320 Aphasia following cerebral infarction: Secondary | ICD-10-CM | POA: Diagnosis not present

## 2014-08-21 DIAGNOSIS — IMO0002 Reserved for concepts with insufficient information to code with codable children: Secondary | ICD-10-CM

## 2014-08-21 DIAGNOSIS — R4189 Other symptoms and signs involving cognitive functions and awareness: Secondary | ICD-10-CM

## 2014-08-21 DIAGNOSIS — R449 Unspecified symptoms and signs involving general sensations and perceptions: Secondary | ICD-10-CM

## 2014-08-21 DIAGNOSIS — I69359 Hemiplegia and hemiparesis following cerebral infarction affecting unspecified side: Secondary | ICD-10-CM

## 2014-08-21 NOTE — Therapy (Signed)
[place] [address] [place], [place], [address] Phone: [phone]   Fax:  [phone]  Occupational Therapy Treatment  Patient Details  Name: Rebecca Russo MRN: [id number] Date of Birth: 1975/07/18 Referring Provider:  Rosalin Hawking, MD  Encounter Date: 08/21/2014      OT End of Session - 08/21/14 1659    Visit Number 9   Number of Visits 16   Date for OT Re-Evaluation 08/20/14   Authorization Type [employer]   OT Start Time 1450   OT Stop Time 1533   OT Time Calculation (min) 43 min   Activity Tolerance Patient tolerated treatment well   Behavior During Therapy [facility] for tasks assessed/performed      Past Medical History  Diagnosis Date  . H/O varicella   . Increased BMI 12/17/08  . Heart defect   . Stroke 05/29/2014    "weaker in right arm and speech problems since" (07/09/2014)  . Acute respiratory failure with hypoxia   . Aphasia complicating stroke   . Hemiplegia affecting dominant side   . Ischemic brain damage   . Heart defect     Past Surgical History  Procedure Laterality Date  . Laparoscopic cholecystectomy  06/20/2012  . Radiology with anesthesia N/A 05/29/2014    Procedure: RADIOLOGY WITH ANESTHESIA;  Surgeon: Rob Hickman, MD;  Location: [place];  Service: Radiology;  Laterality: N/A;  . Tee without cardioversion N/A 06/03/2014    Procedure: TRANSESOPHAGEAL ECHOCARDIOGRAM (TEE);  Surgeon: Josue Hector, MD;  Location: [facility] ENDOSCOPY;  Service: Cardiovascular;  Laterality: N/A;  . Patent foramen ovale closure  07/09/2014  . Patent foramen ovale closure N/A 07/09/2014    Procedure: PATENT FORAMEN OVALE CLOSURE;  Surgeon: Blane Ohara, MD;  Location: [facility] CATH LAB;  Service: Cardiovascular;  Laterality: N/A;    There were no vitals filed for this visit.  Visit Diagnosis:  No diagnosis found.      Subjective Assessment - 08/21/14 1654    Subjective  I am determined!   Pertinent History see  snapshot, CVA, PFO- repaired   Limitations Aphasia, expressive>receptive   Patient Stated Goals Use my right hand!   Currently in Pain? No/denies   Pain Score 0-No pain   Multiple Pain Sites No                    OT Treatments/Exercises (OP) - 08/21/14 0001    ADLs   Grooming Patient brought make-up supplies as she would wear to church.  This is very important for patient to feel that she is adequately dressed and made up to look her best for services each week.  Patient has been relying on her friend to come and apply her makeup.  She demonstrated graded muscle pressure, and coordination to appropriately (per her assessment) don and doff eye liner, and foundation.  Patient very pleased with end result, and plans to continue to practice to perfect eye liner technique to not apply too heavy.    UB Dressing Patient brought in a clasp bracelet and necklace, and problem solved with this therapist to find strategies to effectively don using right hand as dominant.  Patient shocked at her ability to open tiny clasp of necklace and put on without use of vision - behind her neck!  Practiced with clasps of varying shape and size and patient able to effectively use right hand with increased time.  OT Education - 08/21/14 1659    Education provided Yes   Education Details Encouraged to continue to practice with fine motor skills to perfect fine skills such as applying eye liner   Person(s) Educated Patient   Methods Explanation;Demonstration   Comprehension Verbalized understanding;Returned demonstration          OT Short Term Goals - 08/21/14 1701    OT SHORT TERM GOAL #1   Title Patient will utilize her cell phone to call a family member / friend  using her right hand to "dial" - 07/27/2014   Status Achieved   OT SHORT TERM GOAL #2   Title Patient will legibly write at the single word level using her right hand with AE as needed   Status Achieved   OT SHORT  TERM GOAL #3   Title Patient will demonstrate beginning keyboarding skills to type incorporating bilateral upper extremities   Status Achieved   OT SHORT TERM GOAL #4   Title Patient will demonstrate adequate strength and coordination in right hand to fasten seat belt in car.    Status Achieved   OT SHORT TERM GOAL #5   Title Patient will complete simple meal prep (cold) with verbal cueing only   Status Achieved           OT Long Term Goals - 08/21/14 1701    OT LONG TERM GOAL #1   Title Patient will effectively don/ doff earrings or necklace using both hands -4/08/21/2014   Status Achieved   OT LONG TERM GOAL #2   Title Patient will legibly write at the sentence level using her right hand with AE as needed   Status Achieved   OT LONG TERM GOAL #3   Title Patient will apply makeup as for church using bilateral upper extremities, with right hand as dominant.   Status Achieved   OT LONG TERM GOAL #4   Title Patient will demonstrate adequate coordination with keyboarding skills to send simple email to colleague or friend using bilateral upper extremities   Status Achieved   OT LONG TERM GOAL #5   Title Patient will effectively pay bills and balance checking account with modified independence.     Status Achieved   OT LONG TERM GOAL #6   Title Patient will prepare simple family meal utilizing stovetop and/or oven with modified independence   Status Achieved               Plan - 08/21/14 1700    Clinical Impression Statement Patient has met all goals and agrees with plan to discharge from OT services at this time   Pt will benefit from skilled therapeutic intervention in order to improve on the following deficits (Retired) Decreased cognition;Decreased coordination;Decreased strength;Impaired UE functional use;Impaired sensation   Rehab Potential Excellent   Clinical Impairments Affecting Rehab Potential global aphasia   OT Frequency 2x / week   OT Duration 8 weeks   OT  Treatment/Interventions Self-care/ADL training;Therapeutic exercise;Neuromuscular education;DME and/or AE instruction;Therapeutic exercises;Therapeutic activities;Cognitive remediation/compensation;Patient/family education   Plan discharge further OT services at this time   OT [place] and Agree with Plan of Care Patient        Problem List Patient Active Problem List   Diagnosis Date Noted  . Morbid obesity 06/04/2014  . Dysphagia, pharyngoesophageal phase 06/04/2014  . PFO with atrial septal aneurysm 06/04/2014  . Hyperlipidemia LDL goal <70 06/04/2014  . Ischemic brain damage   . Acute respiratory failure  with hypoxia   . CVA (cerebral vascular accident) 05/29/2014  . Aphasia complicating stroke 03/52/4818  . Hemiplegia affecting dominant side 05/29/2014  . CONSTIPATION 03/09/2008  . IRRITABLE BOWEL SYNDROME 03/09/2008     OCCUPATIONAL THERAPY DISCHARGE SUMMARY  Visits from Start of Care: 9 Current functional level related to goals / functional outcomes: Patient has made significant functional improvement due to improved cognition (problem solving, initiation) and improved coordination and strength in right upper extremity such that she is using RUE as dominant extremity again.  Patient is independent with ADL, and most IADL's driving, cooking for her family, paying bills on line, and enjoying time on line and in person with friends and family.    Remaining deficits: Language - aphasia, mild decreased sensation right digits 4,5.   Education / Equipment: Patient independent with home exercise and home activity plan.   Plan: Patient agrees to discharge.  Patient goals were met. Patient is being discharged due to meeting the stated rehab goals.  ?????       Mariah Milling, OTR/L 08/21/2014, 5:03 PM  [place] [address] [place], [place], [address] Phone: [phone]    Fax:  [phone]

## 2014-08-21 NOTE — Patient Instructions (Signed)
  Please complete the assigned speech therapy homework and return it to your next session.  

## 2014-08-21 NOTE — Therapy (Signed)
[facility] [address] [place], [place], [address] Phone: [phone]   Fax:  [phone]  Speech Language Pathology Treatment  Patient Details  Name: Rebecca Russo MRN: [id number] Date of Birth: Nov 24, 1975 Referring Provider:  Marvel Plan, MD  Encounter Date: 08/21/2014      End of Session - 08/21/14 1618    Visit Number 17   Number of Visits 24   Date for SLP Re-Evaluation 09/10/14   SLP Start Time 1535   SLP Stop Time  1615   SLP Time Calculation (min) 40 min   Activity Tolerance Patient tolerated treatment well      Past Medical History  Diagnosis Date  . H/O varicella   . Increased BMI 12/17/08  . Heart defect   . Stroke 05/29/2014    "weaker in right arm and speech problems since" (07/09/2014)  . Acute respiratory failure with hypoxia   . Aphasia complicating stroke   . Hemiplegia affecting dominant side   . Ischemic brain damage   . Heart defect     Past Surgical History  Procedure Laterality Date  . Laparoscopic cholecystectomy  06/20/2012  . Radiology with anesthesia N/A 05/29/2014    Procedure: RADIOLOGY WITH ANESTHESIA;  Surgeon: Oneal Grout, MD;  [facility] OR;  Service: Radiology;  Laterality: N/A;  . Tee without cardioversion N/A 06/03/2014    Procedure: TRANSESOPHAGEAL ECHOCARDIOGRAM (TEE);  Surgeon: Wendall Stade, MD;  Location: [facility] ENDOSCOPY;  Service: Cardiovascular;  Laterality: N/A;  . Patent foramen ovale closure  07/09/2014  . Patent foramen ovale closure N/A 07/09/2014    Procedure: PATENT FORAMEN OVALE CLOSURE;  Surgeon: Micheline Chapman, MD;  Location: [facility] CATH LAB;  Service: Cardiovascular;  Laterality: N/A;    There were no vitals filed for this visit.  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             ADULT SLP TREATMENT - 08/21/14 1543    General Information   Behavior/Cognition Alert;Pleasant mood;Cooperative   Treatment Provided   Treatment provided Cognitive-Linquistic   Pain Assessment   Pain Assessment No/denies pain   Cognitive-Linquistic Treatment   Treatment focused on Aphasia   Skilled Treatment Written aphasia targeted by SLP today by having pt write sentence level reponses with given phrases with max A, usually. Pt rewrote owrds she had difficulty with, with max A initially. SLP assisted pt usually in writing words associated with a given noun.    Assessment / Recommendations / Plan   Plan Continue with current plan of care   Progression Toward Goals   Progression toward goals Progressing toward goals            SLP Short Term Goals - 08/18/14 1451    SLP SHORT TERM GOAL #1   Title Pt will perform simple convergent and divergent naming tasks with 85% accuracy and occassional minimal assistance (due 07/10/14)   Status Achieved   SLP SHORT TERM GOAL #2   Title Pt will demonstrate auditory comprehension of simple conversation and questions with 85% accuracy and occassional minimal assistance (07/10/14)   Status Achieved   SLP SHORT TERM GOAL #3   Title Pt will id object to phrase/sentence description with 85% accuracy and rare minimal assistance (07/10/14)   Status Achieved   SLP SHORT TERM GOAL #4   Title ID object/picture to written description f:4 with 80% acccuracy and rare minimal assistance (07/10/14)   Status Achieved  SLP Long Term Goals - 08/21/14 1620    SLP LONG TERM GOAL #1   Title Demonsrate compensations for aphasia during simple conversation 80% of opportunities and occassional minimal assistance (08/04/14)   Status Achieved   SLP LONG TERM GOAL #2   Status Achieved   SLP LONG TERM GOAL #3   Title Comprehend simple 3-4 sentence paragraph with 80% accuracy and occassional min A. (08/04/14)   Baseline 09/07/14   Time 4   Period Weeks   Status On-going   SLP LONG TERM GOAL #4   Time 3   SLP LONG TERM GOAL #5   Title pt will perform mod complex writing tasks with 70% success  and occasinal min A   Baseline 09/10/14   Time 4   Period Weeks   Status On-going   SLP LONG TERM GOAL #6   Title pt to engage in 8 minutes mod complex conversation with occasional min A    Time 4   Period Weeks   Status New          Plan - 08/21/14 1619    Clinical Impression Statement Max A usually for sentence responses - written. Pt side-eyeing SLP multiple times during session when she had difficulty. Pt will need to improve written language more if she wants to return to work.   Speech Therapy Frequency 3x / week   Duration 4 weeks   Treatment/Interventions Language facilitation;Cueing hierarchy;Functional tasks;Patient/family education;Compensatory strategies;SLP instruction and feedback   Potential to Achieve Goals Good   Potential Considerations Severity of impairments        Problem List Patient Active Problem List   Diagnosis Date Noted  . Morbid obesity 06/04/2014  . Dysphagia, pharyngoesophageal phase 06/04/2014  . PFO with atrial septal aneurysm 06/04/2014  . Hyperlipidemia LDL goal <70 06/04/2014  . Ischemic brain damage   . Acute respiratory failure with hypoxia   . CVA (cerebral vascular accident) 05/29/2014  . Aphasia complicating stroke 05/29/2014  . Hemiplegia affecting dominant side 05/29/2014  . CONSTIPATION 03/09/2008  . IRRITABLE BOWEL SYNDROME 03/09/2008    Verdie Mosher, SLP 08/21/2014, 4:21 PM  [facility] [place] [address] [place], [place], [address] Phone: [phone]   Fax:  [phone]

## 2014-08-24 ENCOUNTER — Ambulatory Visit: Payer: Managed Care, Other (non HMO) | Admitting: Occupational Therapy

## 2014-08-25 ENCOUNTER — Ambulatory Visit: Payer: Managed Care, Other (non HMO) | Admitting: Speech Pathology

## 2014-08-26 ENCOUNTER — Ambulatory Visit: Payer: Managed Care, Other (non HMO) | Admitting: Occupational Therapy

## 2014-08-26 ENCOUNTER — Ambulatory Visit: Payer: Managed Care, Other (non HMO)

## 2014-08-26 DIAGNOSIS — I69320 Aphasia following cerebral infarction: Secondary | ICD-10-CM

## 2014-08-26 NOTE — Therapy (Signed)
[facility] [address] [place], [place], [address] Phone: [phone]   Fax:  [phone]  Speech Language Pathology Treatment  Patient Details  Name: Rebecca Russo MRN: [id number] Date of Birth: Sep 16, 1975 Referring Provider:  Marvel Plan, MD  Encounter Date: 08/26/2014      End of Session - 08/26/14 1620    Visit Number 18   Number of Visits 24   Date for SLP Re-Evaluation 09/10/14   SLP Start Time 1535   SLP Stop Time  1615   SLP Time Calculation (min) 40 min   Activity Tolerance Patient tolerated treatment well      Past Medical History  Diagnosis Date  . H/O varicella   . Increased BMI 12/17/08  . Heart defect   . Stroke 05/29/2014    "weaker in right arm and speech problems since" (07/09/2014)  . Acute respiratory failure with hypoxia   . Aphasia complicating stroke   . Hemiplegia affecting dominant side   . Ischemic brain damage   . Heart defect     Past Surgical History  Procedure Laterality Date  . Laparoscopic cholecystectomy  06/20/2012  . Radiology with anesthesia N/A 05/29/2014    Procedure: RADIOLOGY WITH ANESTHESIA;  Surgeon: Oneal Grout, MD;  Location: [facility] OR;  Service: Radiology;  Laterality: N/A;  . Tee without cardioversion N/A 06/03/2014    Procedure: TRANSESOPHAGEAL ECHOCARDIOGRAM (TEE);  Surgeon: Wendall Stade, MD;  Location: [facility] ENDOSCOPY;  Service: Cardiovascular;  Laterality: N/A;  . Patent foramen ovale closure  07/09/2014  . Patent foramen ovale closure N/A 07/09/2014    Procedure: PATENT FORAMEN OVALE CLOSURE;  Surgeon: Micheline Chapman, MD;  Location: [facility] CATH LAB;  Service: Cardiovascular;  Laterality: N/A;    There were no vitals filed for this visit.  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      Subjective Assessment - 08/26/14 1540    Subjective Pt with HA for approx 5 days. Used to suffer from migraines. MD aware- awaiting appointment time/day.               ADULT SLP TREATMENT - 08/26/14 1542    General Information   Behavior/Cognition --   Treatment Provided   Treatment provided Cognitive-Linquistic   Pain Assessment   Pain Assessment 0-10   Pain Score 6    Pain Location head   Pain Descriptors / Indicators Headache   Pain Intervention(s) Monitored during session   Cognitive-Linquistic Treatment   Treatment focused on Aphasia   Skilled Treatment Written aphasia targeted today at the word level. Pt req'd mod A occasionally. At sentence level, pt req'd mod A occasionally with simple sentences. SLP gauged pt's understanding of deficits and talked about her returning to work. She stated she thought her talking would be WFL/WNL and she may have more trouble with writing. SLP affirmed those thoughts.   Assessment / Recommendations / Plan   Plan Continue with current plan of care   Progression Toward Goals   Progression toward goals Progressing toward goals          SLP Education - 08/26/14 1620    Education provided Yes   Education Details return to work and deficits lingering    Person(s) Educated Patient   Methods Explanation   Comprehension Verbalized understanding          SLP Short Term Goals - 08/18/14 1451    SLP SHORT TERM GOAL #1   Title Pt will perform simple  convergent and divergent naming tasks with 85% accuracy and occassional minimal assistance (due 07/10/14)   Status Achieved   SLP SHORT TERM GOAL #2   Title Pt will demonstrate auditory comprehension of simple conversation and questions with 85% accuracy and occassional minimal assistance (07/10/14)   Status Achieved   SLP SHORT TERM GOAL #3   Title Pt will id object to phrase/sentence description with 85% accuracy and rare minimal assistance (07/10/14)   Status Achieved   SLP SHORT TERM GOAL #4   Title ID object/picture to written description f:4 with 80% acccuracy and rare minimal assistance (07/10/14)   Status Achieved          SLP Long  Term Goals - 08/26/14 1622    SLP LONG TERM GOAL #1   Title Demonsrate compensations for aphasia during simple conversation 80% of opportunities and occassional minimal assistance (08/04/14)   Status Achieved   SLP LONG TERM GOAL #2   Status Achieved   SLP LONG TERM GOAL #3   Title Comprehend simple 3-4 sentence paragraph with 80% accuracy and occassional min A. (08/04/14)   Baseline 09/07/14   Time 3   Period Weeks   Status On-going   SLP LONG TERM GOAL #5   Title pt will perform mod complex writing tasks with 70% success and occasinal min A   Baseline 09/10/14   Time 3   Period Weeks   Status On-going   SLP LONG TERM GOAL #6   Title pt to engage in 8 minutes mod complex conversation with occasional min A    Time 3   Period Weeks   Status New          Plan - 08/26/14 1621    Clinical Impression Statement Pt with mod a occasionally with word level tasks today. Pt worked through [employer] rated 6/10. Skilled ST to cont to maximize written language.   Speech Therapy Frequency 2x / week   Duration 4 weeks   Treatment/Interventions Language facilitation;Cueing hierarchy;Functional tasks;Patient/family education;Compensatory strategies;SLP instruction and feedback   Potential to Achieve Goals Good   Potential Considerations Severity of impairments        Problem List Patient Active Problem List   Diagnosis Date Noted  . Morbid obesity 06/04/2014  . Dysphagia, pharyngoesophageal phase 06/04/2014  . PFO with atrial septal aneurysm 06/04/2014  . Hyperlipidemia LDL goal <70 06/04/2014  . Ischemic brain damage   . Acute respiratory failure with hypoxia   . CVA (cerebral vascular accident) 05/29/2014  . Aphasia complicating stroke 05/29/2014  . Hemiplegia affecting dominant side 05/29/2014  . CONSTIPATION 03/09/2008  . IRRITABLE BOWEL SYNDROME 03/09/2008    Verdie Mosher, SLP 08/26/2014, 4:26 PM  [facility] [address]  [address] [place], [place], [address] Phone: [phone]   Fax:  [phone]

## 2014-08-26 NOTE — Patient Instructions (Signed)
  Please complete the assigned speech therapy homework and return it to your next session.  

## 2014-08-28 ENCOUNTER — Ambulatory Visit: Payer: Managed Care, Other (non HMO)

## 2014-08-28 DIAGNOSIS — I69320 Aphasia following cerebral infarction: Secondary | ICD-10-CM | POA: Diagnosis not present

## 2014-08-28 NOTE — Therapy (Signed)
[facility] [address] [place], [place], [address] Phone: [phone]   Fax:  [phone]  Speech Language Pathology Treatment  Patient Details  Name: Rebecca Russo MRN: [id number] Date of Birth: 22-Oct-1975 Referring Provider:  Marvel Plan, MD  Encounter Date: 08/28/2014    Past Medical History  Diagnosis Date  . H/O varicella   . Increased BMI 12/17/08  . Heart defect   . Stroke 05/29/2014    "weaker in right arm and speech problems since" (07/09/2014)  . Acute respiratory failure with hypoxia   . Aphasia complicating stroke   . Hemiplegia affecting dominant side   . Ischemic brain damage   . Heart defect     Past Surgical History  Procedure Laterality Date  . Laparoscopic cholecystectomy  06/20/2012  . Radiology with anesthesia N/A 05/29/2014    Procedure: RADIOLOGY WITH ANESTHESIA;  Surgeon: Oneal Grout, MD;  Location: [facility] OR;  Service: Radiology;  Laterality: N/A;  . Tee without cardioversion N/A 06/03/2014    Procedure: TRANSESOPHAGEAL ECHOCARDIOGRAM (TEE);  Surgeon: Wendall Stade, MD;  Location: [facility] ENDOSCOPY;  Service: Cardiovascular;  Laterality: N/A;  . Patent foramen ovale closure  07/09/2014  . Patent foramen ovale closure N/A 07/09/2014    Procedure: PATENT FORAMEN OVALE CLOSURE;  Surgeon: Micheline Chapman, MD;  [facility] CATH LAB;  Service: Cardiovascular;  Laterality: N/A;    There were no vitals filed for this visit.  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      Subjective Assessment - 08/28/14 1453    Subjective Headache better today.               ADULT SLP TREATMENT - 08/28/14 1454    General Information   Behavior/Cognition Alert;Pleasant mood;Cooperative   Treatment Provided   Treatment provided Cognitive-Linquistic   Pain Assessment   Pain Assessment No/denies pain  HA gone.   Cognitive-Linquistic Treatment   Treatment focused on Aphasia   Skilled Treatment Oral tasks telling meaning of expressions, pt with anomia x2/18 responses - did not use compensations. Writing 4 steps in a sequence - pt noticed errors 75% of the time. Mod A needed occasionally. Discussion re: pt's return to work. SLP encouraged pt to have employer request medical records if needed, to assess need for disability.   Assessment / Recommendations / Plan   Plan Continue with current plan of care   Progression Toward Goals   Progression toward goals Progressing toward goals            SLP Short Term Goals - 08/18/14 1451    SLP SHORT TERM GOAL #1   Title Pt will perform simple convergent and divergent naming tasks with 85% accuracy and occassional minimal assistance (due 07/10/14)   Status Achieved   SLP SHORT TERM GOAL #2   Title Pt will demonstrate auditory comprehension of simple conversation and questions with 85% accuracy and occassional minimal assistance (07/10/14)   Status Achieved   SLP SHORT TERM GOAL #3   Title Pt will id object to phrase/sentence description with 85% accuracy and rare minimal assistance (07/10/14)   Status Achieved   SLP SHORT TERM GOAL #4   Title ID object/picture to written description f:4 with 80% acccuracy and rare minimal assistance (07/10/14)   Status Achieved          SLP Long Term Goals - 08/28/14 1653    SLP LONG TERM GOAL #1   Title Demonsrate compensations for aphasia during simple  conversation 80% of opportunities and occassional minimal assistance (08/04/14)   Status Achieved   SLP LONG TERM GOAL #2   Status Achieved   SLP LONG TERM GOAL #3   Title Comprehend simple 3-4 sentence paragraph with 80% accuracy and occassional min A. (08/04/14)   Baseline 09/07/14   Time 3   Period Weeks   Status On-going   SLP LONG TERM GOAL #5   Title pt will perform 2-3 sentence writing tasks with 85% success and occasinal min A   Baseline 09/10/14   Time 3   Period Weeks   Status Revised   SLP LONG TERM GOAL #6   Title  pt to engage in 8 minutes mod complex conversation with occasional min A    Time 3   Period Weeks   Status On-going          Plan - 08/28/14 1654    Speech Therapy Frequency 2x / week   Duration 4 weeks   Treatment/Interventions Language facilitation;Cueing hierarchy;Functional tasks;Patient/family education;Compensatory strategies;SLP instruction and feedback   Potential to Achieve Goals Good   Potential Considerations Severity of impairments        Problem List Patient Active Problem List   Diagnosis Date Noted  . Morbid obesity 06/04/2014  . Dysphagia, pharyngoesophageal phase 06/04/2014  . PFO with atrial septal aneurysm 06/04/2014  . Hyperlipidemia LDL goal <70 06/04/2014  . Ischemic brain damage   . Acute respiratory failure with hypoxia   . CVA (cerebral vascular accident) 05/29/2014  . Aphasia complicating stroke 05/29/2014  . Hemiplegia affecting dominant side 05/29/2014  . CONSTIPATION 03/09/2008  . IRRITABLE BOWEL SYNDROME 03/09/2008    Verdie Mosher, SLP 08/28/2014, 4:56 PM  [place] [facility] [address] [place], [place], [address] Phone: [phone]   Fax:  [phone]

## 2014-09-01 ENCOUNTER — Ambulatory Visit: Payer: Managed Care, Other (non HMO)

## 2014-09-01 DIAGNOSIS — I69320 Aphasia following cerebral infarction: Secondary | ICD-10-CM

## 2014-09-01 NOTE — Therapy (Signed)
[facility] [address] [place], [place], [address] Phone: [phone]   Fax:  [phone]  Speech Language Pathology Treatment  Patient Details  Name: Rebecca Russo MRN: [id number] Date of Birth: [birth year] Referring Provider:  Marvel Plan, MD  Encounter Date: 09/01/2014      End of Session - 09/01/14 1716    Visit Number 19   Number of Visits 24   Date for SLP Re-Evaluation 09/10/14   SLP Start Time 1533   SLP Stop Time  1615   SLP Time Calculation (min) 42 min   Activity Tolerance Patient tolerated treatment well      Past Medical History  Diagnosis Date  . H/O varicella   . Increased BMI 12/17/08  . Heart defect   . Stroke 05/29/2014    "weaker in right arm and speech problems since" (07/09/2014)  . Acute respiratory failure with hypoxia   . Aphasia complicating stroke   . Hemiplegia affecting dominant side   . Ischemic brain damage   . Heart defect     Past Surgical History  Procedure Laterality Date  . Laparoscopic cholecystectomy  06/20/2012  . Radiology with anesthesia N/A 05/29/2014    Procedure: RADIOLOGY WITH ANESTHESIA;  Surgeon: Oneal Grout, MD;  Location: [facility] OR;  Service: Radiology;  Laterality: N/A;  . Tee without cardioversion N/A 06/03/2014    Procedure: TRANSESOPHAGEAL ECHOCARDIOGRAM (TEE);  Surgeon: Wendall Stade, MD;  Location: [facility] ENDOSCOPY;  Service: Cardiovascular;  Laterality: N/A;  . Patent foramen ovale closure  07/09/2014  . Patent foramen ovale closure N/A 07/09/2014    Procedure: PATENT FORAMEN OVALE CLOSURE;  Surgeon: Micheline Chapman, MD;  Location: [facility] CATH LAB;  Service: Cardiovascular;  Laterality: N/A;    There were no vitals filed for this visit.  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      Subjective Assessment - 09/01/14 1537    Subjective "We - went to -- games all weekend."   Currently in Pain? No/denies               ADULT SLP  TREATMENT - 09/01/14 1537    General Information   Behavior/Cognition Alert;Pleasant mood;Cooperative   Treatment Provided   Treatment provided Cognitive-Linquistic   Cognitive-Linquistic Treatment   Treatment focused on Aphasia   Skilled Treatment (self care/home management - 14 minutes) SLP facilitated discussion with pt re: need to talk to people she is comfortable with instead of minimal verbal expression when opportunity arises for conversation. Speech tx<30 minutes: Pt demonstrated adequate understanding of 3-4 sentence paragraph (SLP-read) with 88% success. Pt asked for repeats 1/4 times. Mod copmlex conversation completed with 75% success and good (not excellent)error awareness. Occasional min A needed .   Assessment / Recommendations / Plan   Plan Continue with current plan of care   Progression Toward Goals   Progression toward goals Progressing toward goals            SLP Short Term Goals - 08/18/14 1451    SLP SHORT TERM GOAL #1   Title Pt will perform simple convergent and divergent naming tasks with 85% accuracy and occassional minimal assistance (due 07/10/14)   Status Achieved   SLP SHORT TERM GOAL #2   Title Pt will demonstrate auditory comprehension of simple conversation and questions with 85% accuracy and occassional minimal assistance (07/10/14)   Status Achieved   SLP SHORT TERM GOAL #3   Title Pt will id object to phrase/sentence description  with 85% accuracy and rare minimal assistance (07/10/14)   Status Achieved   SLP SHORT TERM GOAL #4   Title ID object/picture to written description f:4 with 80% acccuracy and rare minimal assistance (07/10/14)   Status Achieved          SLP Long Term Goals - 09/01/14 1543    SLP LONG TERM GOAL #1   Title Demonsrate compensations for aphasia during simple conversation 80% of opportunities and occassional minimal assistance (08/04/14)   Status Achieved   SLP LONG TERM GOAL #2   Title Participate in simple covnersation  verbally with 80% accuracy and occassional minimal assistance (08/04/14)   Status Achieved   SLP LONG TERM GOAL #3   Title Comprehend simple 3-4 sentence paragraph with 80% accuracy and rare min A. (08/04/14)   Baseline 09/07/14   Time 3   Period Weeks   Status Revised   SLP LONG TERM GOAL #5   Title pt will perform 2-3 sentence writing tasks with 85% success and occasinal min A   Baseline 09/10/14   Time 3   Period Weeks   Status Revised   SLP LONG TERM GOAL #6   Title pt to engage in 8 minutes mod complex conversation with occasional min A    Time 3   Period Weeks   Status On-going          Plan - 09/01/14 1717    Clinical Impression Statement Pt with skilled ST necessary to cont to maximize auditory comprehension of longer auditory stimuli, verbal expression in mod copmlex conversation, and written language.   Speech Therapy Frequency 3x / week   Duration --  3 weeks   Treatment/Interventions Language facilitation;Cueing hierarchy;Functional tasks;Patient/family education;Compensatory strategies;SLP instruction and feedback   Potential to Achieve Goals Good   Potential Considerations Severity of impairments        Problem List Patient Active Problem List   Diagnosis Date Noted  . Morbid obesity 06/04/2014  . Dysphagia, pharyngoesophageal phase 06/04/2014  . PFO with atrial septal aneurysm 06/04/2014  . Hyperlipidemia LDL goal <70 06/04/2014  . Ischemic brain damage   . Acute respiratory failure with hypoxia   . CVA (cerebral vascular accident) 05/29/2014  . Aphasia complicating stroke 05/29/2014  . Hemiplegia affecting dominant side 05/29/2014  . CONSTIPATION 03/09/2008  . IRRITABLE BOWEL SYNDROME 03/09/2008    Verdie Mosher, SLP 09/01/2014, 5:20 PM  [facility] [address] [place], [place], [address] Phone: [phone]   Fax:  [phone]

## 2014-09-02 ENCOUNTER — Ambulatory Visit: Payer: Managed Care, Other (non HMO)

## 2014-09-02 ENCOUNTER — Encounter: Payer: Managed Care, Other (non HMO) | Admitting: Occupational Therapy

## 2014-09-03 ENCOUNTER — Ambulatory Visit: Payer: Managed Care, Other (non HMO)

## 2014-09-03 DIAGNOSIS — I69320 Aphasia following cerebral infarction: Secondary | ICD-10-CM

## 2014-09-03 NOTE — Therapy (Signed)
[facility] [address] [place], [place], [address] Phone: [phone]   Fax:  [phone]  Speech Language Pathology Treatment  Patient Details  Name: Rebecca Russo MRN: [id number] Date of Birth: Aug 02, 1975 Referring Provider:  Marvel Plan, MD  Encounter Date: 09/03/2014      End of Session - 09/03/14 1448    Visit Number 20   Number of Visits 24   Date for SLP Re-Evaluation 09/17/14   SLP Start Time 1403   SLP Stop Time  1446   SLP Time Calculation (min) 43 min   Activity Tolerance Patient tolerated treatment well      Past Medical History  Diagnosis Date  . H/O varicella   . Increased BMI 12/17/08  . Heart defect   . Stroke 05/29/2014    "weaker in right arm and speech problems since" (07/09/2014)  . Acute respiratory failure with hypoxia   . Aphasia complicating stroke   . Hemiplegia affecting dominant side   . Ischemic brain damage   . Heart defect     Past Surgical History  Procedure Laterality Date  . Laparoscopic cholecystectomy  06/20/2012  . Radiology with anesthesia N/A 05/29/2014    Procedure: RADIOLOGY WITH ANESTHESIA;  Surgeon: Oneal Grout, MD;  Location: [facility] OR;  Service: Radiology;  Laterality: N/A;  . Tee without cardioversion N/A 06/03/2014    Procedure: TRANSESOPHAGEAL ECHOCARDIOGRAM (TEE);  Surgeon: Wendall Stade, MD;  Location: [facility] ENDOSCOPY;  Service: Cardiovascular;  Laterality: N/A;  . Patent foramen ovale closure  07/09/2014  . Patent foramen ovale closure N/A 07/09/2014    Procedure: PATENT FORAMEN OVALE CLOSURE;  Surgeon: Micheline Chapman, MD;  Location: [facility] CATH LAB;  Service: Cardiovascular;  Laterality: N/A;    There were no vitals filed for this visit.  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      Subjective Assessment - 09/03/14 1406    Subjective "I got sick yesterday - it's --- bothersome."               ADULT SLP TREATMENT - 09/03/14  1406    General Information   Behavior/Cognition Alert;Pleasant mood;Cooperative   Treatment Provided   Treatment provided Cognitive-Linquistic   Pain Assessment   Pain Assessment No/denies pain   Cognitive-Linquistic Treatment   Treatment focused on Aphasia   Skilled Treatment Pt wrote sentences, verbalized first, with 85% success with revisions. Pt noted to correct errors 75% of the time. Mod complex conversatin for 8 minutes resulted in restarts occasionally but was WFL (LTG changed to 10 minutes).   Assessment / Recommendations / Plan   Plan Continue with current plan of care   Progression Toward Goals   Progression toward goals Progressing toward goals            SLP Short Term Goals - 08/18/14 1451    SLP SHORT TERM GOAL #1   Title Pt will perform simple convergent and divergent naming tasks with 85% accuracy and occassional minimal assistance (due 07/10/14)   Status Achieved   SLP SHORT TERM GOAL #2   Title Pt will demonstrate auditory comprehension of simple conversation and questions with 85% accuracy and occassional minimal assistance (07/10/14)   Status Achieved   SLP SHORT TERM GOAL #3   Title Pt will id object to phrase/sentence description with 85% accuracy and rare minimal assistance (07/10/14)   Status Achieved   SLP SHORT TERM GOAL #4   Title ID object/picture to written description f:4  with 80% acccuracy and rare minimal assistance (07/10/14)   Status Achieved          SLP Long Term Goals - 09/03/14 1446    SLP LONG TERM GOAL #1   Title Demonsrate compensations for aphasia during simple conversation 80% of opportunities and occassional minimal assistance (08/04/14)   Status Achieved   SLP LONG TERM GOAL #2   Title Participate in simple covnersation verbally with 80% accuracy and occassional minimal assistance (08/04/14)   Status Achieved   SLP LONG TERM GOAL #3   Title Comprehend simple 3-4 sentence paragraph with 80% accuracy and rare min A. (08/04/14)    Baseline 09/07/14   Time 3   Period Weeks   Status Revised   SLP LONG TERM GOAL #5   Title pt will perform 2-3 sentence writing tasks with 85% success and occasinal min A   Baseline 09/10/14   Time 3   Period Weeks   Status Revised   SLP LONG TERM GOAL #6   Title pt to engage in 10 minutes mod complex conversation with occasional min A    Time 3   Period Weeks   Status Revised          Plan - 09/03/14 1449    Clinical Impression Statement Pt with skilled ST necessary to cont to maximize auditory comprehension of longer auditory stimuli, verbal expression in mod copmlex conversation, and written language.   Speech Therapy Frequency 3x / week   Duration --  3 weeks   Treatment/Interventions Language facilitation;Cueing hierarchy;Functional tasks;Patient/family education;Compensatory strategies;SLP instruction and feedback   Potential to Achieve Goals Good   Potential Considerations Severity of impairments        Problem List Patient Active Problem List   Diagnosis Date Noted  . Morbid obesity 06/04/2014  . Dysphagia, pharyngoesophageal phase 06/04/2014  . PFO with atrial septal aneurysm 06/04/2014  . Hyperlipidemia LDL goal <70 06/04/2014  . Ischemic brain damage   . Acute respiratory failure with hypoxia   . CVA (cerebral vascular accident) 05/29/2014  . Aphasia complicating stroke 05/29/2014  . Hemiplegia affecting dominant side 05/29/2014  . CONSTIPATION 03/09/2008  . IRRITABLE BOWEL SYNDROME 03/09/2008    Verdie Mosher, SLP 09/03/2014, 2:49 PM  [facility] [address] [place], [place], [address] Phone: [phone]   Fax:  [phone]

## 2014-09-04 ENCOUNTER — Encounter: Payer: Managed Care, Other (non HMO) | Admitting: Occupational Therapy

## 2014-09-08 ENCOUNTER — Ambulatory Visit: Payer: Managed Care, Other (non HMO)

## 2014-09-08 DIAGNOSIS — R482 Apraxia: Secondary | ICD-10-CM

## 2014-09-08 DIAGNOSIS — I69320 Aphasia following cerebral infarction: Secondary | ICD-10-CM

## 2014-09-08 NOTE — Therapy (Signed)
[facility] [address] [place], [place], [address] Phone: [phone]   Fax:  [phone]  Speech Language Pathology Treatment  Patient Details  Name: Rebecca Russo MRN: [id number] Date of Birth: 08/02/1975 Referring Provider:  Rosalin Hawking, MD  Encounter Date: 09/08/2014      End of Session - 09/08/14 1631    Visit Number 21   Number of Visits 24   Date for SLP Re-Evaluation 09/17/14   SLP Start Time 8280   SLP Stop Time  0349   SLP Time Calculation (min) 44 min   Activity Tolerance Patient tolerated treatment well      Past Medical History  Diagnosis Date  . H/O varicella   . Increased BMI 12/17/08  . Heart defect   . Stroke 05/29/2014    "weaker in right arm and speech problems since" (07/09/2014)  . Acute respiratory failure with hypoxia   . Aphasia complicating stroke   . Hemiplegia affecting dominant side   . Ischemic brain damage   . Heart defect     Past Surgical History  Procedure Laterality Date  . Laparoscopic cholecystectomy  06/20/2012  . Radiology with anesthesia N/A 05/29/2014    Procedure: RADIOLOGY WITH ANESTHESIA;  Surgeon: Rob Hickman, MD;  Location: [place];  Service: Radiology;  Laterality: N/A;  . Tee without cardioversion N/A 06/03/2014    Procedure: TRANSESOPHAGEAL ECHOCARDIOGRAM (TEE);  Surgeon: Josue Hector, MD;  Location: [facility] ENDOSCOPY;  Service: Cardiovascular;  Laterality: N/A;  . Patent foramen ovale closure  07/09/2014  . Patent foramen ovale closure N/A 07/09/2014    Procedure: PATENT FORAMEN OVALE CLOSURE;  Surgeon: Blane Ohara, MD;  Location: [facility] CATH LAB;  Service: Cardiovascular;  Laterality: N/A;    There were no vitals filed for this visit.  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 She told SLP she has  been speaking more with people outside of her family.               ADULT SLP TREATMENT - 09/08/14 1538    General Information   Behavior/Cognition Alert;Pleasant mood;Cooperative   Treatment Provided   Treatment provided Cognitive-Linquistic   Pain Assessment   Pain Assessment No/denies pain   Cognitive-Linquistic Treatment   Treatment focused on Aphasia   Skilled Treatment Pt met goal today for mod complex conversation. SLP facilitated work with pt's auditory comprehension by reading 45-60 second selectoins and assisting pt with answering questions regarding selection. Pt with 70% success. Pt related to SLP she has difficulty with TV shows, sermons, and teaching times at church. SLP suggested a digital recorder for pt to rewind when she does not understand something at church, and to record those shows she does not understand well (drama). Pt voiced understanding.    Assessment / Recommendations / Plan   Plan Continue with current plan of care   Progression Toward Goals   Progression toward goals Progressing toward goals          SLP Education - 09/08/14 1631    Education provided Yes   Education Details compensations for decr'd auditory comprehension with church activities and with TV shows   Person(s) Educated Patient   Methods Explanation   Comprehension Verbalized understanding          SLP Short Term Goals - 08/18/14  [place] #1   Title Pt will perform simple convergent and divergent naming tasks with 85% accuracy and occassional minimal assistance (due 07/10/14)   Status Achieved   SLP SHORT TERM GOAL #2   Title Pt will demonstrate auditory comprehension of simple conversation and questions with 85% accuracy and occassional minimal assistance (07/10/14)   Status Achieved   SLP SHORT TERM GOAL #3   Title Pt will id object to phrase/sentence description with 85% accuracy and rare minimal assistance (07/10/14)   Status Achieved   SLP SHORT TERM  GOAL #4   Title ID object/picture to written description f:4 with 80% acccuracy and rare minimal assistance (07/10/14)   Status Achieved          SLP Long Term Goals - 09/08/14 1553    SLP LONG TERM GOAL #1   Title Demonsrate compensations for aphasia during simple conversation 80% of opportunities and occassional minimal assistance (08/04/14)   Status Achieved   SLP LONG TERM GOAL #2   Title Participate in simple covnersation verbally with 80% accuracy and occassional minimal assistance (08/04/14)   Status Achieved   SLP LONG TERM GOAL #3   Title Comprehend simple 3-4 sentence paragraph with 80% accuracy and rare min A. (08/04/14)   Baseline 09/07/14   Time --   Period --   Status Achieved   SLP LONG TERM GOAL #5   Title pt will perform 2-3 sentence writing tasks with 85% success and occasinal min A   Baseline 09/10/14   Time 2   Period Weeks   Status Revised   Additional Long Term Goals   Additional Long Term Goals Yes   SLP LONG TERM GOAL #6   Title pt to engage in 10 minutes mod complex conversation with occasional min A    Time --   Period --   Status Achieved   SLP LONG TERM GOAL #7   Title pt to engage in 10 minutes mod complex/complex conversation with occasional min A   Time 2   Period Weeks   Status New          Plan - 09/08/14 1632    Clinical Impression Statement Pt with skilled ST necessary to cont to maximize auditory comprehension of longer auditory stimuli, and improve verbal expression in mod copmlex/complex conversation, and expression in written language.   Speech Therapy Frequency 3x / week   Duration 2 weeks   Treatment/Interventions Language facilitation;Cueing hierarchy;Functional tasks;Patient/family education;Compensatory strategies;SLP instruction and feedback   Potential to Achieve Goals Good   Potential Considerations Severity of impairments        Problem List Patient Active Problem List   Diagnosis Date Noted  . Morbid obesity  06/04/2014  . Dysphagia, pharyngoesophageal phase 06/04/2014  . PFO with atrial septal aneurysm 06/04/2014  . Hyperlipidemia LDL goal <70 06/04/2014  . Ischemic brain damage   . Acute respiratory failure with hypoxia   . CVA (cerebral vascular accident) 05/29/2014  . Aphasia complicating stroke 53/66/4403  . Hemiplegia affecting dominant side 05/29/2014  . CONSTIPATION 03/09/2008  . IRRITABLE BOWEL SYNDROME 03/09/2008    Garald Balding, SLP 09/08/2014, 4:36 PM  [place] [facility] [address] [place], [place], [address] Phone: [phone]   Fax:  [phone]

## 2014-09-09 ENCOUNTER — Ambulatory Visit: Payer: Managed Care, Other (non HMO)

## 2014-09-09 ENCOUNTER — Encounter: Payer: Managed Care, Other (non HMO) | Admitting: Occupational Therapy

## 2014-09-09 DIAGNOSIS — I69320 Aphasia following cerebral infarction: Secondary | ICD-10-CM

## 2014-09-09 NOTE — Patient Instructions (Signed)
  Please complete the assigned speech therapy homework and return it to your next session.  

## 2014-09-09 NOTE — Therapy (Signed)
[facility] [address] [place], [place], [address] Phone: [phone]   Fax:  [phone]  Speech Language Pathology Treatment  Patient Details  Name: Rebecca Russo MRN: [id number] Date of Birth: Dec 03, 1975 Referring Provider:  Marvel Plan, MD  Encounter Date: 09/09/2014      End of Session - 09/09/14 1606    Visit Number 22   Number of Visits 24   Date for SLP Re-Evaluation 09/17/14   SLP Start Time 1449   SLP Stop Time  1531   SLP Time Calculation (min) 42 min   Activity Tolerance Patient tolerated treatment well      Past Medical History  Diagnosis Date  . H/O varicella   . Increased BMI 12/17/08  . Heart defect   . Stroke 05/29/2014    "weaker in right arm and speech problems since" (07/09/2014)  . Acute respiratory failure with hypoxia   . Aphasia complicating stroke   . Hemiplegia affecting dominant side   . Ischemic brain damage   . Heart defect     Past Surgical History  Procedure Laterality Date  . Laparoscopic cholecystectomy  06/20/2012  . Radiology with anesthesia N/A 05/29/2014    Procedure: RADIOLOGY WITH ANESTHESIA;  Surgeon: Oneal Grout, MD;  Location: [facility] OR;  Service: Radiology;  Laterality: N/A;  . Tee without cardioversion N/A 06/03/2014    Procedure: TRANSESOPHAGEAL ECHOCARDIOGRAM (TEE);  Surgeon: Wendall Stade, MD;  Location: [facility] ENDOSCOPY;  Service: Cardiovascular;  Laterality: N/A;  . Patent foramen ovale closure  07/09/2014  . Patent foramen ovale closure N/A 07/09/2014    Procedure: PATENT FORAMEN OVALE CLOSURE;  Surgeon: Micheline Chapman, MD;  Location: [facility] CATH LAB;  Service: Cardiovascular;  Laterality: N/A;    There were no vitals filed for this visit.  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      Subjective Assessment - 09/09/14 1500    Subjective Pt going to beach tomorrow. Canceled Friday session.               ADULT SLP TREATMENT -  09/09/14 1501    General Information   Behavior/Cognition Alert;Pleasant mood;Cooperative   Treatment Provided   Treatment provided Cognitive-Linquistic   Pain Assessment   Pain Assessment No/denies pain   Cognitive-Linquistic Treatment   Treatment focused on Aphasia   Skilled Treatment Pt described pictured (detailed) with functional speech. Descriptions of her favorite movies required consistent extra time and SLP verbal cues as well as questioning cues, occasionally.    Assessment / Recommendations / Plan   Plan Continue with current plan of care   Progression Toward Goals   Progression toward goals Progressing toward goals          SLP Education - 09/08/14 1631    Education provided Yes   Education Details compensations for decr'd auditory comprehension with church activities and with TV shows   Person(s) Educated Patient   Methods Explanation   Comprehension Verbalized understanding          SLP Short Term Goals - 08/18/14 1451    SLP SHORT TERM GOAL #1   Title Pt will perform simple convergent and divergent naming tasks with 85% accuracy and occassional minimal assistance (due 07/10/14)   Status Achieved   SLP SHORT TERM GOAL #2   Title Pt will demonstrate auditory comprehension of simple conversation and questions with 85% accuracy and occassional minimal assistance (07/10/14)   Status Achieved   SLP SHORT TERM GOAL #3  Title Pt will id object to phrase/sentence description with 85% accuracy and rare minimal assistance (07/10/14)   Status Achieved   SLP SHORT TERM GOAL #4   Title ID object/picture to written description f:4 with 80% acccuracy and rare minimal assistance (07/10/14)   Status Achieved          SLP Long Term Goals - 09/09/14 1607    SLP LONG TERM GOAL #1   Title Demonsrate compensations for aphasia during simple conversation 80% of opportunities and occassional minimal assistance (08/04/14)   Status Achieved   SLP LONG TERM GOAL #2   Title  Participate in simple covnersation verbally with 80% accuracy and occassional minimal assistance (08/04/14)   Status Achieved   SLP LONG TERM GOAL #3   Title Comprehend simple 3-4 sentence paragraph with 80% accuracy and rare min A. (08/04/14)   Baseline 09/07/14   Status Achieved   SLP LONG TERM GOAL #4   Title Perform simple functional written expression tasks with 80% accuracy and occassional minimal A (08/04/14)   Status Achieved   SLP LONG TERM GOAL #5   Title pt will perform 2-3 sentence writing tasks with 85% success and occasinal min A   Baseline 09/10/14   Time 2   Period Weeks   Status Revised   SLP LONG TERM GOAL #6   Title pt to engage in 10 minutes mod complex conversation with occasional min A    Status Achieved   SLP LONG TERM GOAL #7   Title pt to engage in 10 minutes mod complex/complex conversation with occasional min A   Time 2   Period Weeks   Status On-going          Plan - 09/09/14 1607    Clinical Impression Statement Pt with skilled ST necessary to cont to maximize auditory comprehension of longer auditory stimuli, and improve verbal expression in mod copmlex/complex conversation, and expression in written language.   Speech Therapy Frequency 3x / week   Duration 2 weeks   Treatment/Interventions Language facilitation;Cueing hierarchy;Functional tasks;Patient/family education;Compensatory strategies;SLP instruction and feedback   Potential to Achieve Goals Good   Potential Considerations Severity of impairments        Problem List Patient Active Problem List   Diagnosis Date Noted  . Morbid obesity 06/04/2014  . Dysphagia, pharyngoesophageal phase 06/04/2014  . PFO with atrial septal aneurysm 06/04/2014  . Hyperlipidemia LDL goal <70 06/04/2014  . Ischemic brain damage   . Acute respiratory failure with hypoxia   . CVA (cerebral vascular accident) 05/29/2014  . Aphasia complicating stroke 05/29/2014  . Hemiplegia affecting dominant side 05/29/2014   . CONSTIPATION 03/09/2008  . IRRITABLE BOWEL SYNDROME 03/09/2008    Verdie Mosher, SLP 09/09/2014, 4:08 PM  [facility] [address] [place], [place], [address] Phone: [phone]   Fax:  [phone]

## 2014-09-10 ENCOUNTER — Telehealth: Payer: Self-pay | Admitting: Cardiovascular Disease

## 2014-09-10 NOTE — Telephone Encounter (Signed)
[employer] papers signed and completed left pt VM ready for pick up.

## 2014-09-11 ENCOUNTER — Encounter: Payer: Managed Care, Other (non HMO) | Admitting: Occupational Therapy

## 2014-09-11 ENCOUNTER — Ambulatory Visit: Payer: Managed Care, Other (non HMO)

## 2014-09-14 ENCOUNTER — Encounter: Payer: Self-pay | Admitting: Neurology

## 2014-09-14 ENCOUNTER — Ambulatory Visit (INDEPENDENT_AMBULATORY_CARE_PROVIDER_SITE_OTHER): Payer: Managed Care, Other (non HMO) | Admitting: Neurology

## 2014-09-14 VITALS — BP 119/75 | HR 72 | Wt 308.8 lb

## 2014-09-14 DIAGNOSIS — I253 Aneurysm of heart: Secondary | ICD-10-CM

## 2014-09-14 DIAGNOSIS — I639 Cerebral infarction, unspecified: Secondary | ICD-10-CM

## 2014-09-14 DIAGNOSIS — Q2112 Patent foramen ovale: Secondary | ICD-10-CM

## 2014-09-14 DIAGNOSIS — Q211 Atrial septal defect: Secondary | ICD-10-CM

## 2014-09-14 MED ORDER — CITALOPRAM HYDROBROMIDE 10 MG PO TABS: 10.0000 mg | ORAL_TABLET | Freq: Every day | ORAL | Status: DC

## 2014-09-14 NOTE — Patient Instructions (Addendum)
-   continue ASA plavix and crestor for stroke prevention. 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 Excell Seltzer for PFO closure monitoring - establish PCP care ASAP.  - Follow up with your primary care physician for stroke risk factor modification. Recommend maintain blood pressure goal <130/80, diabetes with hemoglobin A1c goal below 6.5% and lipids with LDL cholesterol goal below 70 mg/dL.  - follow up in 4 months

## 2014-09-14 NOTE — Progress Notes (Signed)
STROKE NEUROLOGY FOLLOW UP NOTE  NAME: Rebecca Russo DOB: 1975/05/26  REASON FOR VISIT: stroke follow up HISTORY FROM: pt and mom and chart  Today we had the pleasure of seeing Rebecca Russo in follow-up at our Neurology Clinic. Pt was accompanied by mom.   History Summary [name] is a 39 y.o. Obese female with no significant past medical history presenting with right hemiparesis, aphasia and left gaze on waking up on 06/04/14. The patient did not receive TPA secondary to unknown time of onset. Perfusion scan revealed left brain large penumbra. 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 She is status post mechanical thrombectomy with TICI3 recannulization. Repeat CTA head and neck showed patent vessels. Etiology for stroke not clear, stroke work up so far negative except large PFO with mobile ASA. Risk factor including progestin implant (Nexplanon) use for contraception , obesity, large PFO with mobile ASA.discharged with ASA and crestor for stroke prevention.   07/07/14 follow up - the patient has been doing well. She is going to get speech and PT outpt rehab. She also followed up with Dr. Excell Seltzer and was put on dural antiplatelet and will do the endovascular PFO closure 07/09/14. After discharge, she went to ER twice for left knee pain with nodule and with chest pain which considered to be due to anxiety and stress. She feels great today. Her speech is much better and only has mild right hand dexterity difficulty. She has Nexplanon implant at left arm, which she got just before the stroke and lasts for one year.   Interval History During the interval time, the patient was doing well. She had PFO closure procedure with Dr. Excell Seltzer on 07/09/14 and went successful. She then had 2D echo repeat which showed stable cardiac function. She is to have 2D echo bubble study in June to evaluate the closure device. She was told to be on dural antiplatelet for 6 months after the procedure.    Pt still has speech therapy 3 times per week, still has deficit with expression but improved from previous. She still has sensory deficit on her right hand. Easily feel tired but also admit the depressed mood and not able to be back to work. Is dealing with long term disability now. She had Nexplanon implant removed but was put on [name] which is progestin-only OCP for birth control.  REVIEW OF SYSTEMS: Full 14 system review of systems performed and notable only for those listed below and in HPI above, all others are negative:  Constitutional:   Cardiovascular:  Ear/Nose/Throat:   Skin:  Eyes:   Respiratory:   Gastroitestinal:   Genitourinary:  Hematology/Lymphatic:   Endocrine:  Musculoskeletal:   Allergy/Immunology:   Neurological:   Psychiatric:  Sleep:   The following represents the patient's updated allergies and side effects list: No Known Allergies  The neurologically relevant items on the patient's problem list were reviewed on today's visit.  Neurologic Examination  A problem focused neurological exam (12 or more points of the single system neurologic examination, vital signs counts as 1 point, cranial nerves count for 8 points) was performed.  Blood pressure 119/75, pulse 72, weight 308 lb 12.8 oz (140.071 kg).  General - Morbid obesity, well developed, in no apparent distress.  Ophthalmologic - Sharp disc margins OU.  Cardiovascular - Regular rate and rhythm with no murmur.  Mental Status -  Level of arousal and orientation to time, place, and person were intact. Partial expressive aphasia with hesitancy of  speech and word finding difficulties. Able to repeat, mild deficit on naming, intact on comprehension.   Cranial Nerves II - XII - II - Visual field intact OU. III, IV, VI - Extraocular movements intact. V - Facial sensation intact bilaterally. VII - Facial movement intact bilaterally. VIII - Hearing & vestibular intact bilaterally. X - Palate elevates  symmetrically. XI - Chin turning & shoulder shrug intact bilaterally. XII - Tongue protrusion intact.  Motor Strength - The patient's strength was normal in all extremities and pronator drift was absent except mild right hand dexterity deficit.  Bulk was normal and fasciculations were absent.   Motor Tone - Muscle tone was assessed at the neck and appendages and was normal.  Reflexes - The patient's reflexes were normal in all extremities and she had no pathological reflexes.  Sensory - Light touch, temperature/pinprick, vibration and proprioception, and Romberg testing were assessed and were normal.    Coordination - The patient had normal movements in the hands and feet with no ataxia or dysmetria.  Tremor was absent.  Gait and Station - The patient's transfers, posture, gait, station, and turns were observed as normal.  Data reviewed: I personally reviewed the images and agree with the radiology interpretations.  Ct Head Wo Contrast 05/30/2014 Subtle CT changes of left MCA territory infarct, most apparent at the insula, with no associated hemorrhage or mass effect. No new intracranial abnormality.  05/29/2014 Subtle decreased sulcation in the left cerebral hemisphere raising the question for diffuse edema. No evidence for acute intracranial hemorrhage.  05/29/2014 Motion degraded exam showing no definite acute stroke or hemorrhage. Hyperdense LEFT ICA suggesting proximal large vessel occlusion   Ct Cerebral Perfusion W/cm 05/29/2014 There could be a moderate-sized ischemic penumbra in the LEFT hemisphere in this patient with suspected LEFT ICA occlusion. At the time of dictation, the patient is in interventional radiology in preparation for possible thrombolysis.   Cerebral Angiogram 05/29/2014 LT ICA and LT T occlusion in isolated Lt cerebral hemisphere. S/P complete TIC 3 revascularization using 2 passes with Trevoprovue 4 mm x 30 mm retrieval device and 8 mg of  superselective intracranial intraarterial Integrelin.  Dg Chest Port 1 View 05/31/2014 Extubation, with low lung volumes but no acute disease. 05/30/2014 Low lung volumes with mild right basilar atelectasis.   Dg Abd Portable 1v 05/29/2014 1. OG tube tip in the proximal body of the stomach. 2. Early/incipient small bowel ileus.   MRI/MRA of the brain Patient refused  CTA head and neck 06/01/14 1. Collected evolution of infarcts involving the left insular cortex and segmental areas of the left anterior MCA distribution. This territory is significantly smaller than the area of ischemia identified on the CT perfusion scan. 2. Persistent patency of the left internal carotid artery. 3. Slight irregularity of the distal left ICA. This may represent fibromuscular dysplasia. 4. Evidence luxury perfusion within the infarcted territories of the left frontal lobe.  2D Echocardiogram  06/01/14  - Left ventricle: The cavity size was normal. Wall thickness was normal. Systolic function was normal. The estimated ejection fraction was in the range of 55% to 60%. Wall motion was normal; there were no regional wall motion abnormalities. Dopplerparameters are consistent with abnormal left ventricularrelaxation (grade 1 diastolic dysfunction). Impressions: No cardiac source of emboli was indentified.  08/14/14  - Left ventricle: The cavity size was mildly dilated. Wall thickness was normal. Systolic function was normal. The estimated ejection fraction was in the range of 50% to 55%. Wall motion was normal;  there were no regional wall motion abnormalities. - Atrial septum: A closure device was present. Impressions: - Limited study to R/O pericardial effusion; low normal LV function; atrial septal closure device noted with no residual shunt; no pericardial effusion.  LE venous doppler - negative for DVT  EKG Sinus rhythm. 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 2) Normal EF 60% 3) No LAA thrombus 4) Trivial MR/AR 5) Normal RV 6) No pericardial effusion 7) No aortic debris    Component     Latest Ref Rng 05/29/2014 05/30/2014 05/30/2014          3:45 AM 12:00 PM  PTT Lupus Anticoagulant     28.0 - 43.0 secs 34.9    PTTLA Confirmation     <8.0 secs NOT APPL    PTTLA 4:1 Mix     28.0 - 43.0 secs NOT APPL    DRVVT     <42.9 secs 35.0    Drvvt confirmation     <1.15 Ratio NOT APPL    dRVVT Incubated 1:1 Mix     <42.9 secs NOT APPL    Lupus Anticoagulant     NOT DETECTED NOT DETECTED    Cholesterol     0 - 200 mg/dL  376 283  Triglycerides     <150 mg/dL  151 (H) 761 (H)  HDL     >39 mg/dL  33 (L) 31 (L)  Total CHOL/HDL Ratio       5.0 5.6  VLDL     0 - 40 mg/dL  43 (H) 45 (H)  LDL (calc)     0 - 99 mg/dL  89 97  Beta-2 Glyco I IgG     <20 G Units 14    Beta-2-Glycoprotein I IgM     <20 M Units 6    Beta-2-Glycoprotein I IgA     <20 A Units 6    Interpretation-F5LEID:      REPORT    Recommendations-F5LEID:      REPORT    Reviewer      REPORT    Interpretation-PTGENE:      REPORT    Recommendations-PTGENE:      REPORT    Reviewer      REPORT    Anticardiolipin IgG     <23 GPL U/mL 3 (L)    Anticardiolipin IgM     <11 MPL U/mL 4 (L)    Anticardiolipin IgA     <22 APL U/mL 10 (L)    c-ANCA Screen     NEGATIVE     p-ANCA Screen     NEGATIVE     Atypical p-ANCA Screen     NEGATIVE     Hemoglobin A1C     <5.7 %  5.6   Mean Plasma Glucose     <117 mg/dL  607   AntiThromb III Func     75 - 120 % 105    Protein C Activity     74 - 151 % 174 (H)    Protein C, Total     70 - 140 % 110    Protein S Activity     60 - 145 % 113    Protein S Ag, Total     58 - 150 % 128    Homocysteine     0.0 - 15.0 umol/L 8.1    ds  DNA Ab          C3 Complement     90 - 180 mg/dL     Complement C4, Body Fluid     10 - 40 mg/dL       SSA (Ro) (ENA) Antibody, IgG     <1.0 NEG AI     SSB (La) (ENA) Antibody, IgG     <1.0 NEG AI     Rhuematoid fact SerPl-aCnc     <=14 IU/mL     Anit Nuclear Antibody(ANA)     NEGATIVE     Alpha galactosidase, serum     0.074 - 0.457 U/L     Sickle Cell Screen     NEGATIVE     D-Dimer, Quant     0.00 - 0.48 ug/mL-FEU      Component     Latest Ref Rng 06/02/2014 06/03/2014 06/27/2014            PTT Lupus Anticoagulant     28.0 - 43.0 secs     PTTLA Confirmation     <8.0 secs     PTTLA 4:1 Mix     28.0 - 43.0 secs     DRVVT     <42.9 secs     Drvvt confirmation     <1.15 Ratio     dRVVT Incubated 1:1 Mix     <42.9 secs     Lupus Anticoagulant     NOT DETECTED     Cholesterol     0 - 200 mg/dL     Triglycerides     <960 mg/dL     HDL     >45 mg/dL     Total CHOL/HDL Ratio          VLDL     0 - 40 mg/dL     LDL (calc)     0 - 99 mg/dL     Beta-2 Glyco I IgG     <20 G Units     Beta-2-Glycoprotein I IgM     <20 M Units     Beta-2-Glycoprotein I IgA     <20 A Units     Interpretation-F5LEID:          Recommendations-F5LEID:          Reviewer          Interpretation-PTGENE:          Recommendations-PTGENE:          Reviewer          Anticardiolipin IgG     <23 GPL U/mL     Anticardiolipin IgM     <11 MPL U/mL     Anticardiolipin IgA     <22 APL U/mL     c-ANCA Screen     NEGATIVE NEGATIVE    p-ANCA Screen     NEGATIVE NEGATIVE    Atypical p-ANCA Screen     NEGATIVE NEGATIVE    Hemoglobin A1C     <5.7 %     Mean Plasma Glucose     <117 mg/dL     AntiThromb III Func     75 - 120 %     Protein C Activity     74 - 151 %     Protein C, Total     70 - 140 %     Protein S Activity     60 - 145 %     Protein S Ag, Total     58 -  150 %     Homocysteine     0.0 - 15.0 umol/L     ds DNA Ab      1    C3 Complement     90 - 180 mg/dL 161 (H)    Complement C4, Body Fluid     10 - 40 mg/dL 38    SSA (Ro) (ENA) Antibody, IgG     <1.0 NEG AI  <1.0 NEG    SSB (La) (ENA) Antibody, IgG     <1.0 NEG AI <1.0 NEG    Rhuematoid fact SerPl-aCnc     <=14 IU/mL <10    Anit Nuclear Antibody(ANA)     NEGATIVE NEGATIVE    Alpha galactosidase, serum     0.074 - 0.457 U/L 0.262    Sickle Cell Screen     NEGATIVE  NEGATIVE   D-Dimer, Quant     0.00 - 0.48 ug/mL-FEU   0.40    Assessment: As you may recall, she is a 39 y.o. African American obese female with no significant past medical history was admitted on 06/04/14 for left MCA stroke. Pt refused MRI but CT repeat confirmed left MCA stroke. 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 She received mechanical thrombectomy with TICI3 recannulization.Repeat CTA head and neck showed patent vessels. Etiology for stroke not clear, stroke work up including TEE, TTE, CUS, hypercoagulable and autoimmue work up all negative except large PFO with mobile ASA. Risk factor including progestin implant (Nexplanon) use for contraception, obesity, and large PFO with mobile ASA.Due to lack of risk factors and young age, PFO closure was considered and Dr. Excell Seltzer did the procedure 07/09/14. She has removed Nexplanon implant but was put on progestin-only OCP. Continue dural antiplatelet for 6 months post PFO closure procedure. 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  Plan:  - continue ASA, plavix and crestor for stroke prevention. - start celexa for post stroke depression - continue speech therapy and encourage speech exercise at home - follow up with Dr. Excell Seltzer - establish PCP care ASAP.  - Follow up with your primary care physician for stroke risk factor modification. Recommend maintain blood pressure goal <130/80, diabetes with hemoglobin A1c goal below 6.5% and lipids with LDL cholesterol goal below 70 mg/dL.  - Regular exercises and weight loss - RTC in 4 months  No orders of the defined  types were placed in this encounter.    Meds ordered this encounter  Medications  . [name] 0.35 MG tablet    Sig:   . Multiple Vitamins-Minerals (MULTIVITAMIN WITH MINERALS) tablet    Sig: Take 1 tablet by mouth daily.  . citalopram (CELEXA) 10 MG tablet    Sig: Take 1 tablet (10 mg total) by mouth daily.    Dispense:  90 tablet    Refill:  3    Patient Instructions  - continue ASA plavix and crestor for stroke prevention. 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 Excell Seltzer for PFO closure monitoring - establish PCP care ASAP.  - Follow up with your primary care physician for stroke risk factor modification. Recommend maintain blood pressure goal <130/80, diabetes with hemoglobin A1c goal below 6.5% and lipids with LDL cholesterol goal below 70 mg/dL.  - follow up in 4 months    Marvel Plan, MD  PhD [facility] [address] [place], [place] [address] [phone]

## 2014-09-15 ENCOUNTER — Ambulatory Visit: Payer: Managed Care, Other (non HMO)

## 2014-09-17 ENCOUNTER — Ambulatory Visit: Payer: Managed Care, Other (non HMO)

## 2014-09-22 ENCOUNTER — Ambulatory Visit: Payer: Managed Care, Other (non HMO) | Attending: Neurology

## 2014-09-22 DIAGNOSIS — I69391 Dysphagia following cerebral infarction: Secondary | ICD-10-CM | POA: Diagnosis not present

## 2014-09-22 DIAGNOSIS — I69390 Apraxia following cerebral infarction: Secondary | ICD-10-CM | POA: Insufficient documentation

## 2014-09-22 DIAGNOSIS — I69320 Aphasia following cerebral infarction: Secondary | ICD-10-CM | POA: Diagnosis not present

## 2014-09-22 DIAGNOSIS — R482 Apraxia: Secondary | ICD-10-CM

## 2014-09-22 NOTE — Therapy (Signed)
[facility] [address] [place], [place], [address] Phone: [phone]   Fax:  [phone]  Speech Language Pathology Treatment  Patient Details  Name: Rebecca Russo MRN: [id number] Date of Birth: May 25, 1975 Referring Provider:  Marvel Plan, MD  Encounter Date: 09/22/2014      End of Session - 09/22/14 1538    Visit Number 23   Number of Visits 24   Date for SLP Re-Evaluation 10/06/14   SLP Start Time 1447   SLP Stop Time  1530   SLP Time Calculation (min) 43 min   Activity Tolerance Patient tolerated treatment well      Past Medical History  Diagnosis Date  . H/O varicella   . Increased BMI 12/17/08  . Heart defect   . Stroke 05/29/2014    "weaker in right arm and speech problems since" (07/09/2014)  . Acute respiratory failure with hypoxia   . Aphasia complicating stroke   . Hemiplegia affecting dominant side   . Ischemic brain damage   . Heart defect     Past Surgical History  Procedure Laterality Date  . Laparoscopic cholecystectomy  06/20/2012  . Radiology with anesthesia N/A 05/29/2014    Procedure: RADIOLOGY WITH ANESTHESIA;  Surgeon: Oneal Grout, MD;  Location: [facility] OR;  Service: Radiology;  Laterality: N/A;  . Tee without cardioversion N/A 06/03/2014    Procedure: TRANSESOPHAGEAL ECHOCARDIOGRAM (TEE);  Surgeon: Wendall Stade, MD;  Location[facility] ENDOSCOPY;  Service: Cardiovascular;  Laterality: N/A;  . Patent foramen ovale closure  07/09/2014  . Patent foramen ovale closure N/A 07/09/2014    Procedure: PATENT FORAMEN OVALE CLOSURE;  Surgeon: Micheline Chapman, MD;  Location: [facility] CATH LAB;  Service: Cardiovascular;  Laterality: N/A;    There were no vitals filed for this visit.  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  Apraxia of speech      Subjective Assessment - 09/22/14 1453    Subjective "Superheroes. You don't watch movies like that." (to mother)   Patient is  accompained by: Family member  mother               ADULT SLP TREATMENT - 09/22/14 1455    General Information   Behavior/Cognition Alert;Pleasant mood;Cooperative   Treatment Provided   Treatment provided Cognitive-Linquistic   Pain Assessment   Pain Assessment No/denies pain   Cognitive-Linquistic Treatment   Treatment focused on Aphasia   Skilled Treatment Pt told SLP expressions meanings with occasional verbal/semantic cues. SLP facilitated conversation outside ST room (mod complex/complex) with min verbal cues.           SLP Education - 09/22/14 1537    Education provided Yes   Education Details therapy course (SLP will ask MD to extend to 11-06-14, 4 more weeks with possible d/c at that time)   Person(s) Educated Patient   Methods Explanation   Comprehension Verbalized understanding          SLP Short Term Goals - 08/18/14 1451    SLP SHORT TERM GOAL #1   Title Pt will perform simple convergent and divergent naming tasks with 85% accuracy and occassional minimal assistance (due 07/10/14)   Status Achieved   SLP SHORT TERM GOAL #2   Title Pt will demonstrate auditory comprehension of simple conversation and questions with 85% accuracy and occassional minimal assistance (07/10/14)   Status Achieved   SLP SHORT TERM GOAL #3   Title Pt will id object to phrase/sentence description with 85%  accuracy and rare minimal assistance (07/10/14)   Status Achieved   SLP SHORT TERM GOAL #4   Title ID object/picture to written description f:4 with 80% acccuracy and rare minimal assistance (07/10/14)   Status Achieved          SLP Long Term Goals - 09/22/14 1539    SLP LONG TERM GOAL #1   Title Demonsrate compensations for aphasia during simple conversation 80% of opportunities and occassional minimal assistance (08/04/14)   Status Achieved   SLP LONG TERM GOAL #2   Title Participate in simple covnersation verbally with 80% accuracy and occassional minimal assistance  (08/04/14)   Status Achieved   SLP LONG TERM GOAL #3   Title Comprehend simple 3-4 sentence paragraph with 80% accuracy and rare min A. (08/04/14)   Baseline 09/07/14   Status Achieved   SLP LONG TERM GOAL #4   Title Perform simple functional written expression tasks with 80% accuracy and occassional minimal A (08/04/14)   Status Achieved   SLP LONG TERM GOAL #5   Title pt will perform 2-3 sentence writing tasks with 85% success and occasinal min A   Baseline 09/10/14   Time 2   Period Weeks   Status Revised   SLP LONG TERM GOAL #6   Title pt to engage in 10 minutes mod complex conversation with occasional min A    Status Achieved   SLP LONG TERM GOAL #7   Title pt to engage in 10 minutes mod complex/complex conversation with occasional min A   Status Achieved          Plan - 09/22/14 1538    Speech Therapy Frequency 3x / week   Duration 2 weeks   Treatment/Interventions Language facilitation;Cueing hierarchy;Functional tasks;Patient/family education;Compensatory strategies;SLP instruction and feedback   Potential to Achieve Goals Good   Potential Considerations Severity of impairments        Problem List Patient Active Problem List   Diagnosis Date Noted  . Morbid obesity 06/04/2014  . Dysphagia, pharyngoesophageal phase 06/04/2014  . PFO with atrial septal aneurysm 06/04/2014  . Hyperlipidemia LDL goal <70 06/04/2014  . Ischemic brain damage   . Acute respiratory failure with hypoxia   . CVA (cerebral vascular accident) 05/29/2014  . Aphasia complicating stroke 05/29/2014  . Hemiplegia affecting dominant side 05/29/2014  . CONSTIPATION 03/09/2008  . IRRITABLE BOWEL SYNDROME 03/09/2008    [facility] 09/22/2014, 3:40 PM  [facility] [address] [place], [place], [address] Phone: [phone]   Fax:  [phone]

## 2014-09-22 NOTE — Patient Instructions (Signed)
TALK TALK TALK with everybody you can talk with. This is the best practice for you at this point.

## 2014-09-23 ENCOUNTER — Ambulatory Visit: Payer: Managed Care, Other (non HMO)

## 2014-09-23 DIAGNOSIS — I69320 Aphasia following cerebral infarction: Secondary | ICD-10-CM | POA: Diagnosis not present

## 2014-09-23 DIAGNOSIS — R482 Apraxia: Secondary | ICD-10-CM

## 2014-09-23 NOTE — Therapy (Signed)
[facility] [address] [place], [place], [address] Phone: [phone]   Fax:  [phone]  Speech Language Pathology Treatment  Patient Details  Name: Rebecca Russo MRN: [id number] Date of Birth: 02/07/1976 Referring Provider:  Marvel Plan, MD  Encounter Date: 09/23/2014      End of Session - 09/23/14 1444    Visit Number 24   Number of Visits 24   Date for SLP Re-Evaluation 10/06/14   SLP Start Time 1153   SLP Stop Time  1236   SLP Time Calculation (min) 43 min   Activity Tolerance Patient tolerated treatment well      Past Medical History  Diagnosis Date  . H/O varicella   . Increased BMI 12/17/08  . Heart defect   . Stroke 05/29/2014    "weaker in right arm and speech problems since" (07/09/2014)  . Acute respiratory failure with hypoxia   . Aphasia complicating stroke   . Hemiplegia affecting dominant side   . Ischemic brain damage   . Heart defect     Past Surgical History  Procedure Laterality Date  . Laparoscopic cholecystectomy  06/20/2012  . Radiology with anesthesia N/A 05/29/2014    Procedure: RADIOLOGY WITH ANESTHESIA;  Surgeon: Oneal Grout, MD;  Location: [facility] OR;  Service: Radiology;  Laterality: N/A;  . Tee without cardioversion N/A 06/03/2014    Procedure: TRANSESOPHAGEAL ECHOCARDIOGRAM (TEE);  Surgeon: Wendall Stade, MD;  Location: [facility] ENDOSCOPY;  Service: Cardiovascular;  Laterality: N/A;  . Patent foramen ovale closure  07/09/2014  . Patent foramen ovale closure N/A 07/09/2014    Procedure: PATENT FORAMEN OVALE CLOSURE;  Surgeon: Micheline Chapman, MD;  Location: [facility] CATH LAB;  Service: Cardiovascular;  Laterality: N/A;    There were no vitals filed for this visit.  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               ADULT SLP TREATMENT - 09/23/14 1202    General Information   Behavior/Cognition Alert;Pleasant mood;Cooperative   Treatment Provided   Treatment provided Cognitive-Linquistic   Pain Assessment   Pain Assessment No/denies pain   Cognitive-Linquistic Treatment   Treatment focused on Aphasia   Skilled Treatment Simple conversation today re pt's son's basketball (mod complex) was functional with extra time. In a writing task of picture description, pt benefitted from verbal and visual cues usually for 1-2 words per response of one 7-9 word sentence.   Assessment / Recommendations / Plan   Plan Continue with current plan of care   Progression Toward Goals   Progression toward goals Progressing toward goals          SLP Education - 09/22/14 1537    Education provided Yes   Education Details therapy course (SLP will ask MD to extend to 11-06-14, 4 more weeks with possible d/c at that time)   Person(s) Educated Patient   Methods Explanation   Comprehension Verbalized understanding          SLP Short Term Goals - 09/23/14 1448    SLP SHORT TERM GOAL #1   Title Pt will perform simple convergent and divergent naming tasks with 85% accuracy and occassional minimal assistance (due 07/10/14)  Status Achieved   SLP SHORT TERM GOAL #2   Title Pt will demonstrate auditory comprehension of simple conversation and questions with 85% accuracy and occassional minimal assistance (07/10/14)   Status Achieved   SLP SHORT TERM GOAL #3   Title Pt will id object to phrase/sentence description with 85% accuracy and rare minimal assistance (07/10/14)   Status Achieved   SLP SHORT TERM GOAL #4   Title ID object/picture to written description f:4 with 80% acccuracy and rare minimal assistance (07/10/14)   Status Achieved   SLP SHORT TERM GOAL #5   Title pt to write sentences with modified  independence (extra time) (enforced week of 09-28-14)   Time 4   Period Weeks   Status New   Additional Short Term Goals   Additional Short Term Goals Yes   SLP SHORT TERM GOAL #6   Title pt to engage in mod complex/complex conversation with rare cues   Time 4   Period Weeks   Status New          SLP Long Term Goals - 09/23/14 1555    SLP LONG TERM GOAL #1   Title Demonsrate compensations for aphasia during simple conversation 80% of opportunities and occassional minimal assistance (08/04/14)   Status Achieved   SLP LONG TERM GOAL #2   Title Participate in simple covnersation verbally with 80% accuracy and occassional minimal assistance (08/04/14)   Status Achieved   SLP LONG TERM GOAL #3   Title Comprehend simple 3-4 sentence paragraph with 80% accuracy and rare min A. (08/04/14)   Baseline 09/07/14   Status Achieved   SLP LONG TERM GOAL #4   Title Perform simple functional written expression tasks with 80% accuracy and occassional minimal A (08/04/14)   Status Achieved   SLP LONG TERM GOAL #5   Title pt will perform 2-3 sentence writing tasks with 85% success and occasional min A   Baseline beginning week of 09-28-14   Time 6   Period Weeks   Status Revised   Additional Long Term Goals   Additional Long Term Goals Yes   SLP LONG TERM GOAL #6   Title pt to engage in 10 minutes mod complex conversation with occasional min A    Status Achieved   SLP LONG TERM GOAL #7   Title pt to engage in 10 minutes mod complex/complex conversation with occasional min A   Status Achieved   SLP LONG TERM GOAL #8   Title engage in 15 mod complex/complex conversation with compensations used   Baseline begining week of 09-28-14   Time 6   Period Weeks   Status New          Plan - 09/23/14 1445    Clinical Impression Statement Pt's written language appears improved since last time it was targeted in therapy sessions.Skilled ST remains necessary to target written language at the sentence  level and above, and to cont to work with pt's verbal expression at the mod complex/complex levels. Pt also cont to manage her comfort with talking to others, which SLP is encouraging her to do, as she has stated she is much more quiet now, voluntarily, than premorbidly.   Speech Therapy Frequency 2x / week  chanage frequency to x2/week   Duration --  6 weeks   Treatment/Interventions Language facilitation;Cueing hierarchy;Functional tasks;Patient/family education;Compensatory strategies;SLP instruction and feedback   Potential to Achieve Goals Good   Potential Considerations Severity of impairments        Problem List  Patient Active Problem List   Diagnosis Date Noted  . Morbid obesity 06/04/2014  . Dysphagia, pharyngoesophageal phase 06/04/2014  . PFO with atrial septal aneurysm 06/04/2014  . Hyperlipidemia LDL goal <70 06/04/2014  . Ischemic brain damage   . Acute respiratory failure with hypoxia   . CVA (cerebral vascular accident) 05/29/2014  . Aphasia complicating stroke 05/29/2014  . Hemiplegia affecting dominant side 05/29/2014  . CONSTIPATION 03/09/2008  . IRRITABLE BOWEL SYNDROME 03/09/2008    Verdie Mosher, SLP 09/23/2014, 4:03 PM  [facility] [address] [place], [place], [address] Phone: [phone]   Fax:  [phone]

## 2014-09-29 ENCOUNTER — Telehealth: Payer: Self-pay | Admitting: Speech Pathology

## 2014-09-29 ENCOUNTER — Encounter: Payer: Managed Care, Other (non HMO) | Admitting: Speech Pathology

## 2014-09-29 NOTE — Telephone Encounter (Signed)
Attempted to leave message -automated answer did not cue to leave a message, however there was a beep, so I left message. Unsure if it went through or not. Left message re: missed appointment at 11:00 today and reminder of next appointment on 5/19 at 1:15. Instructed pt to call front desk

## 2014-10-06 ENCOUNTER — Ambulatory Visit: Payer: Managed Care, Other (non HMO) | Admitting: Speech Pathology

## 2014-10-06 DIAGNOSIS — I69320 Aphasia following cerebral infarction: Secondary | ICD-10-CM

## 2014-10-06 DIAGNOSIS — R482 Apraxia: Secondary | ICD-10-CM

## 2014-10-06 NOTE — Patient Instructions (Signed)
Written expression homework provided

## 2014-10-06 NOTE — Therapy (Signed)
[facility] [address] [place], [place], [address] Phone: [phone]   Fax:  [phone]  Speech Language Pathology Treatment  Patient Details  Name: Rebecca Russo MRN: [id number] Date of Birth: Dec 26, 1975 Referring Provider:  Marvel Plan, MD  Encounter Date: 10/06/2014      End of Session - 10/06/14 1153    Visit Number 25   Number of Visits 36   Date for SLP Re-Evaluation 10/27/14   SLP Start Time 1105   SLP Stop Time  1146   SLP Time Calculation (min) 41 min   Activity Tolerance Patient tolerated treatment well      Past Medical History  Diagnosis Date  . H/O varicella   . Increased BMI 12/17/08  . Heart defect   . Stroke 05/29/2014    "weaker in right arm and speech problems since" (07/09/2014)  . Acute respiratory failure with hypoxia   . Aphasia complicating stroke   . Hemiplegia affecting dominant side   . Ischemic brain damage   . Heart defect     Past Surgical History  Procedure Laterality Date  . Laparoscopic cholecystectomy  06/20/2012  . Radiology with anesthesia N/A 05/29/2014    Procedure: RADIOLOGY WITH ANESTHESIA;  Surgeon: Oneal Grout, MD;  Location: [facility] OR;  Service: Radiology;  Laterality: N/A;  . Tee without cardioversion N/A 06/03/2014    Procedure: TRANSESOPHAGEAL ECHOCARDIOGRAM (TEE);  Surgeon: Wendall Stade, MD;  Location: [facility] ENDOSCOPY;  Service: Cardiovascular;  Laterality: N/A;  . Patent foramen ovale closure  07/09/2014  . Patent foramen ovale closure N/A 07/09/2014    Procedure: PATENT FORAMEN OVALE CLOSURE;  Surgeon: Micheline Chapman, MD;  Location: [facility] CATH LAB;  Service: Cardiovascular;  Laterality: N/A;    There were no vitals filed for this visit.  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 No/denies                ADULT SLP TREATMENT - 10/06/14 1111    General Information   Behavior/Cognition Alert;Pleasant mood;Cooperative   Treatment Provided   Treatment provided Cognitive-Linquistic   Cognitive-Linquistic Treatment   Treatment focused on Aphasia   Skilled Treatment Conversation re: family activities and son's college plans with extended time and min questioning cues. Pt debated issues with ST with extended time and questioning cues - Pt with good error awareness. Written expression with 2-3 sentence    Assessment / Recommendations / Plan   Plan Continue with current plan of care   Progression Toward Goals   Progression toward goals Progressing toward goals            SLP Short Term Goals - 10/06/14 1152    SLP SHORT TERM GOAL #1   Title Pt will perform simple convergent and divergent naming tasks with 85% accuracy and occassional minimal assistance (due 07/10/14)   Status Achieved   SLP SHORT TERM GOAL #2   Title Pt will demonstrate auditory comprehension of simple conversation and questions with 85% accuracy and occassional minimal assistance (07/10/14)   Status Achieved   SLP SHORT TERM GOAL #3   Title Pt will id object to phrase/sentence description with 85% accuracy and rare minimal assistance (07/10/14)   Status Achieved   SLP SHORT TERM GOAL #4   Title ID object/picture to written description f:4  with 80% acccuracy and rare minimal assistance (07/10/14)   Status Achieved   SLP SHORT TERM GOAL #5   Title pt to write sentences with modified independence (extra time) (enforced week of 09-28-14)   Time 4   Period Weeks   Status New   SLP SHORT TERM GOAL #6   Title pt to engage in mod complex/complex conversation with rare cues   Time 4   Period Weeks   Status New          SLP Long Term Goals - 10/06/14 1152    SLP LONG TERM GOAL #1   Title Demonsrate compensations for aphasia during simple conversation 80% of opportunities and occassional minimal  assistance (08/04/14)   Status Achieved   SLP LONG TERM GOAL #2   Title Participate in simple covnersation verbally with 80% accuracy and occassional minimal assistance (08/04/14)   Status Achieved   SLP LONG TERM GOAL #3   Title Comprehend simple 3-4 sentence paragraph with 80% accuracy and rare min A. (08/04/14)   Baseline 09/07/14   Status Achieved   SLP LONG TERM GOAL #4   Title Perform simple functional written expression tasks with 80% accuracy and occassional minimal A (08/04/14)   Status Achieved   SLP LONG TERM GOAL #5   Title pt will perform 2-3 sentence writing tasks with 85% success and occasional min A   Baseline beginning week of 09-28-14   Time 5   Period Weeks   Status Revised   SLP LONG TERM GOAL #6   Title pt to engage in 10 minutes mod complex conversation with occasional min A    Status Achieved   SLP LONG TERM GOAL #7   Title pt to engage in 10 minutes mod complex/complex conversation with occasional min A   Status Achieved   SLP LONG TERM GOAL #8   Title engage in 15 mod complex/complex conversation with compensations used   Baseline begining week of 09-28-14   Time 5   Period Weeks   Status New          Plan - 10/06/14 1150    Clinical Impression Statement Moderately complex verbal and written expression continue to required min to mod A and extended time. Continue skilled ST to maximize wriiten and verbal expression for idependene and eventual return to [place].   Speech Therapy Frequency 2x / week   Treatment/Interventions Language facilitation;Cueing hierarchy;Functional tasks;Patient/family education;Compensatory strategies;SLP instruction and feedback   Consulted and Agree with Plan of Care Patient        Problem List Patient Active Problem List   Diagnosis Date Noted  . Morbid obesity 06/04/2014  . Dysphagia, pharyngoesophageal phase 06/04/2014  . PFO with atrial septal aneurysm 06/04/2014  . Hyperlipidemia LDL goal <70 06/04/2014  .  Ischemic brain damage   . Acute respiratory failure with hypoxia   . CVA (cerebral vascular accident) 05/29/2014  . Aphasia complicating stroke 05/29/2014  . Hemiplegia affecting dominant side 05/29/2014  . CONSTIPATION 03/09/2008  . IRRITABLE BOWEL SYNDROME 03/09/2008    Lovvorn, Radene Journey MS, CCC-SLP 10/06/2014, 11:55 AM  [facility] [address] [place], [place], [address] Phone: [phone]   Fax:  [phone]

## 2014-10-07 ENCOUNTER — Telehealth: Payer: Self-pay | Admitting: *Deleted

## 2014-10-07 NOTE — Telephone Encounter (Signed)
Form,[employer] sent to [facility] and Dr [name] 10/07/14.

## 2014-10-08 ENCOUNTER — Ambulatory Visit: Payer: Managed Care, Other (non HMO) | Admitting: Speech Pathology

## 2014-10-14 ENCOUNTER — Ambulatory Visit: Payer: Managed Care, Other (non HMO)

## 2014-10-15 ENCOUNTER — Ambulatory Visit: Payer: Managed Care, Other (non HMO) | Admitting: Speech Pathology

## 2014-10-15 NOTE — Telephone Encounter (Signed)
Form,[employer] received,completed by Corrie Dandy and Dr [name] faxed,copy at front desk for patient 10/15/14.

## 2014-10-15 NOTE — Telephone Encounter (Signed)
Paperwork completed by Dr [name] and given to [facility], MR dept for patient to pick up.

## 2014-10-29 ENCOUNTER — Ambulatory Visit: Payer: Managed Care, Other (non HMO)

## 2014-11-03 ENCOUNTER — Ambulatory Visit: Payer: Managed Care, Other (non HMO) | Attending: Neurology | Admitting: Speech Pathology

## 2014-11-03 DIAGNOSIS — R488 Other symbolic dysfunctions: Secondary | ICD-10-CM | POA: Insufficient documentation

## 2014-11-03 DIAGNOSIS — R482 Apraxia: Secondary | ICD-10-CM

## 2014-11-03 DIAGNOSIS — I69320 Aphasia following cerebral infarction: Secondary | ICD-10-CM | POA: Diagnosis present

## 2014-11-03 NOTE — Patient Instructions (Signed)
  Speech exercises - do 5x each, x2-3/day SLOW BIG    SAY THE FOLLOWING- make every sound! Red leather, yellow leather  Big grocery buggy    Purple baby carriage    [facility] [place] copper coffee pot Ripe purple cabbage Three free throws [employer], Blue Bulb Flash Message Dave dipped the dessert  [employer] [place] An Art gallery manager for [employer] Five valve levers Six Thick Thistles Stick Double Bubble Gum Fat cows give milk Automatic Data Gophers Fat frogs flip freely [employer] into bed Get that game to [employer] [place] [employer] Fish Cinnamon aluminum linoleum Black bugs blood Lovely lemon linament Buckle that Air traffic controller Takes Time A Shifty Salt Shaker   The gospel of Mark Shirts shrink, shells shouldn't [address] 49ers Take the tackle box Give me five flapjacks Fundamental relatives Call the cat "Buttercup" A calendar of [place] Four floors to cover Yellow oil ointment Fellow lovers of felines Catastrophe in [place] Plump plumbers' plums The church's chimes chimed Telling time until eleven Unique [place] A Three Toed Tree Toad Knapsack Strap Snap Rubber Baby Buggy Bumpers   Read difficult passages aloud, read rhyming sentences aloud  Keep list of words that are difficult for you to say, such as "procrastination"

## 2014-11-03 NOTE — Therapy (Signed)
[facility] [address] [place], [place], [address] Phone: [phone]   Fax:  [phone]  Speech Language Pathology Treatment  Patient Details  Name: Rebecca Russo MRN: [id number] Date of Birth: 26-Oct-1975 Referring Provider:  Marvel Plan, MD  Encounter Date: 11/03/2014      End of Session - 11/03/14 1146    SLP Stop Time  1146      Past Medical History  Diagnosis Date  . H/O varicella   . Increased BMI 12/17/08  . Heart defect   . Stroke 05/29/2014    "weaker in right arm and speech problems since" (07/09/2014)  . Acute respiratory failure with hypoxia   . Aphasia complicating stroke   . Hemiplegia affecting dominant side   . Ischemic brain damage   . Heart defect     Past Surgical History  Procedure Laterality Date  . Laparoscopic cholecystectomy  06/20/2012  . Radiology with anesthesia N/A 05/29/2014    Procedure: RADIOLOGY WITH ANESTHESIA;  Surgeon: Oneal Grout, MD;  Location: [facility] OR;  Service: Radiology;  Laterality: N/A;  . Tee without cardioversion N/A 06/03/2014    Procedure: TRANSESOPHAGEAL ECHOCARDIOGRAM (TEE);  Surgeon: Wendall Stade, MD;  Location: [facility] ENDOSCOPY;  Service: Cardiovascular;  Laterality: N/A;  . Patent foramen ovale closure  07/09/2014  . Patent foramen ovale closure N/A 07/09/2014    Procedure: PATENT FORAMEN OVALE CLOSURE;  Surgeon: Micheline Chapman, MD;  Location: [facility] CATH LAB;  Service: Cardiovascular;  Laterality: N/A;    There were no vitals filed for this visit.  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 No/denies               ADULT SLP TREATMENT - 11/03/14 1109    General Information   Behavior/Cognition Alert;Pleasant mood;Cooperative   Treatment Provided   Treatment provided Cognitive-Linquistic   Cognitive-Linquistic Treatment   Treatment focused on Aphasia;Apraxia   Skilled Treatment Pt's last session was 10/06/14 as she has had migraines. I d/w pt attendance policy  and need to attend therapy consistently 2x a week. Reviewed goals with pt. She stated she wanted to "work on words that she can't say." Faciliated production of low frequency multisyllabic words with reading word aloud 3x and putting it in a sentence with  slow rate - pt required occassional min to mod A and extended time to  generate sentence with low frequency complex words. She required min to mod A to produce complex multisyllabic words due to motor planning difficulties. Cues to use slow rate required.    Assessment / Recommendations / Plan   Plan Continue with current plan of care   Progression Toward Goals   Progression toward goals Progressing toward goals          SLP Education - 11/03/14 1123    Education provided Yes   Education Details revised goals, continue ST 2x a week for 4 weeks through 12/03/14, attendance policy reviewed   Person(s) Educated Patient   Methods Explanation   Comprehension Verbalized understanding          SLP Short Term Goals - 10/06/14 1152    SLP SHORT TERM GOAL #1   Title Pt will perform simple convergent and divergent naming tasks with 85%  accuracy and occassional minimal assistance (due 07/10/14)   Status Achieved   SLP SHORT TERM GOAL #2   Title Pt will demonstrate auditory comprehension of simple conversation and questions with 85% accuracy and occassional minimal assistance (07/10/14)   Status Achieved   SLP SHORT TERM GOAL #3   Title Pt will id object to phrase/sentence description with 85% accuracy and rare minimal assistance (07/10/14)   Status Achieved   SLP SHORT TERM GOAL #4   Title ID object/picture to written description f:4 with 80% acccuracy and rare minimal assistance (07/10/14)   Status Achieved    SLP SHORT TERM GOAL #5   Title pt to write sentences with modified independence (extra time) (enforced week of 09-28-14)   Time 4   Period Weeks   Status New   SLP SHORT TERM GOAL #6   Title pt to engage in mod complex/complex conversation with rare cues   Time 4   Period Weeks   Status New          SLP Long Term Goals - 11/03/14 1128    SLP LONG TERM GOAL #1   Title Demonsrate compensations for aphasia during simple conversation 80% of opportunities and occassional minimal assistance (08/04/14)   Status Achieved   SLP LONG TERM GOAL #2   Title Participate in simple covnersation verbally with 80% accuracy and occassional minimal assistance (08/04/14)   Status Achieved   SLP LONG TERM GOAL #3   Title Comprehend simple 3-4 sentence paragraph with 80% accuracy and rare min A. (08/04/14)   Baseline 09/07/14   Status Achieved   SLP LONG TERM GOAL #4   Title Perform simple functional written expression tasks with 80% accuracy and occassional minimal A (08/04/14)   Status Achieved   SLP LONG TERM GOAL #5   Title pt will perform 2-3 sentence writing tasks with 85% success and occasional min A   Baseline beginning week of 09-28-14   Time 4   Period Weeks   Status On-going   SLP LONG TERM GOAL #6   Title pt to engage in 10 minutes mod complex conversation with occasional min A    Status Achieved   SLP LONG TERM GOAL #7   Title pt to engage in 10 minutes mod complex/complex conversation with occasional min A   Status Achieved   SLP LONG TERM GOAL #8   Title engage in 15 minutes of complex/complex conversation with compensations used with supervision cues   Baseline begining week of 09-28-14   Time 4   Period Weeks   Status On-going          Plan - 11/03/14 1125    Clinical Impression Statement Pt's last session prior to today was 10/06/14 as pt reports having migraines. I reviewed attendance policy. Pt required extra time and min to mod A to generate sentences with low frequency  complex words and to  repeat complex multi-syllabic words accurately. Continue skilled ST 2x a week for 4 more weeks to [employer] verbal and written expression.   Speech Therapy Frequency 2x / week   Treatment/Interventions Language facilitation;Cueing hierarchy;Functional tasks;Patient/family education;Compensatory strategies;SLP instruction and feedback   Potential to Achieve Goals Good   Potential Considerations Cooperation/participation level   Consulted and Agree with Plan of Care Patient        Problem List Patient Active Problem List   Diagnosis Date Noted  . Morbid obesity 06/04/2014  . Dysphagia, pharyngoesophageal phase 06/04/2014  . PFO with atrial septal aneurysm 06/04/2014  .  Hyperlipidemia LDL goal <70 06/04/2014  . Ischemic brain damage   . Acute respiratory failure with hypoxia   . CVA (cerebral vascular accident) 05/29/2014  . Aphasia complicating stroke 05/29/2014  . Hemiplegia affecting dominant side 05/29/2014  . CONSTIPATION 03/09/2008  . IRRITABLE BOWEL SYNDROME 03/09/2008    Lovvorn, Radene Journey MS, CCC-SLP 11/03/2014, 11:46 AM  [facility] [address] [place], [place], [address] Phone: [phone]   Fax:  [phone]

## 2014-11-05 ENCOUNTER — Ambulatory Visit: Payer: Managed Care, Other (non HMO)

## 2014-11-05 DIAGNOSIS — I69320 Aphasia following cerebral infarction: Secondary | ICD-10-CM | POA: Diagnosis not present

## 2014-11-05 NOTE — Therapy (Signed)
[facility] [address] [place], [place], [address] Phone: [phone]   Fax:  [phone]  Speech Language Pathology Treatment  Patient Details  Name: Rebecca Russo MRN: [id number] Date of Birth: 1976-01-05 Referring Provider:  Marvel Plan, MD  Encounter Date: 11/05/2014    Past Medical History  Diagnosis Date  . H/O varicella   . Increased BMI 12/17/08  . Heart defect   . Stroke 05/29/2014    "weaker in right arm and speech problems since" (07/09/2014)  . Acute respiratory failure with hypoxia   . Aphasia complicating stroke   . Hemiplegia affecting dominant side   . Ischemic brain damage   . Heart defect     Past Surgical History  Procedure Laterality Date  . Laparoscopic cholecystectomy  06/20/2012  . Radiology with anesthesia N/A 05/29/2014    Procedure: RADIOLOGY WITH ANESTHESIA;  Surgeon: Oneal Grout, MD;  Location: [facility] OR;  Service: Radiology;  Laterality: N/A;  . Tee without cardioversion N/A 06/03/2014    Procedure: TRANSESOPHAGEAL ECHOCARDIOGRAM (TEE);  Surgeon: Wendall Stade, MD;  Location: [facility] ENDOSCOPY;  Service: Cardiovascular;  Laterality: N/A;  . Patent foramen ovale closure  07/09/2014  . Patent foramen ovale closure N/A 07/09/2014    Procedure: PATENT FORAMEN OVALE CLOSURE;  Surgeon: Micheline Chapman, MD;  Location: [facility] CATH LAB;  Service: Cardiovascular;  Laterality: N/A;    There were no vitals filed for this visit.  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             ADULT SLP TREATMENT - 11/05/14 1419    General Information   Behavior/Cognition Alert;Pleasant mood;Cooperative   Treatment Provided   Treatment provided Cognitive-Linquistic   Pain Assessment   Pain Assessment No/denies pain   Cognitive-Linquistic Treatment   Treatment focused on Aphasia;Apraxia   Skilled Treatment SLP facilitated mod complex/compelx conversation outside ST room to address  pt's consistency with slow speech rate over an entire conversation of >10 minutes, compensating for rare anomia and minimal apraxic errors. Pt with excellent success with reduced rate and speech fluency at this level. SLP reminded pt that a slower rate will provide extra time for word finding as well as better motor planning. In the last 5-7 minutes of therapy pt had 3 anomic errors - eventually successful with extra time. SLP also strongly encouraged pt to think about using some external cues for rate reduction as she said it is more challenging to have slow rate outside of ST sessions.   Assessment / Recommendations / Plan   Plan Continue with current plan of care   Progression Toward Goals   Progression toward goals Progressing toward goals          SLP Education - 11/05/14 1427    Education provided Yes   Education Details extrenal cues for slowed rate   Person(s) Educated Patient   Methods Explanation   Comprehension Verbalized understanding          SLP Short Term Goals - 10/06/14 1152    SLP SHORT TERM GOAL #1   Title Pt will perform simple convergent and divergent naming tasks with 85% accuracy and occassional minimal assistance (due 07/10/14)   Status Achieved   SLP SHORT TERM GOAL #2   Title Pt will demonstrate auditory comprehension of simple conversation and questions with 85% accuracy and occassional minimal assistance (07/10/14)   Status Achieved   SLP SHORT TERM GOAL #3   Title Pt will id object  to phrase/sentence description with 85% accuracy and rare minimal assistance (07/10/14)   Status Achieved   SLP SHORT TERM GOAL #4   Title ID object/picture to written description f:4 with 80% acccuracy and rare minimal assistance (07/10/14)   Status Achieved   SLP SHORT TERM GOAL #5   Title pt to write sentences with modified independence (extra time) (enforced week of 09-28-14)   Time 4   Period Weeks   Status New   SLP SHORT TERM GOAL #6   Title pt to engage in mod  complex/complex conversation with rare cues   Time 4   Period Weeks   Status New          SLP Long Term Goals - 11/05/14 1323    SLP LONG TERM GOAL #1   Title Demonsrate compensations for aphasia during simple conversation 80% of opportunities and occassional minimal assistance (08/04/14)   Status Achieved   SLP LONG TERM GOAL #2   Title Participate in simple covnersation verbally with 80% accuracy and occassional minimal assistance (08/04/14)   Status Achieved   SLP LONG TERM GOAL #3   Title Comprehend simple 3-4 sentence paragraph with 80% accuracy and rare min A. (08/04/14)   Baseline 09/07/14   Status Achieved   SLP LONG TERM GOAL #4   Title Perform simple functional written expression tasks with 80% accuracy and occassional minimal A (08/04/14)   Status Achieved   SLP LONG TERM GOAL #5   Title pt will perform 2-3 sentence writing tasks with 85% success and occasional min A   Baseline beginning week of 09-28-14   Time 4   Period Weeks   Status On-going   SLP LONG TERM GOAL #6   Title pt to engage in 10 minutes mod complex conversation with occasional min A    Status Achieved   SLP LONG TERM GOAL #7   Title pt to engage in 10 minutes mod complex/complex conversation with occasional min A   Time 2   Period Weeks   Status Achieved   SLP LONG TERM GOAL #8   Title engage in 15 minutes of complex/complex conversation with compensations used with supervision cues   Baseline begining week of 09-28-14   Time 4   Period Weeks   Status On-going          Problem List Patient Active Problem List   Diagnosis Date Noted  . Morbid obesity 06/04/2014  . Dysphagia, pharyngoesophageal phase 06/04/2014  . PFO with atrial septal aneurysm 06/04/2014  . Hyperlipidemia LDL goal <70 06/04/2014  . Ischemic brain damage   . Acute respiratory failure with hypoxia   . CVA (cerebral vascular accident) 05/29/2014  . Aphasia complicating stroke 05/29/2014  . Hemiplegia affecting dominant  side 05/29/2014  . CONSTIPATION 03/09/2008  . IRRITABLE BOWEL SYNDROME 03/09/2008    [facility] , MS, CCC-SLP   11/05/2014, 2:28 PM  [facility] [address] [place], [place], [address] Phone: [phone]   Fax:  [phone]

## 2014-11-11 ENCOUNTER — Ambulatory Visit: Payer: Managed Care, Other (non HMO)

## 2014-11-11 DIAGNOSIS — I69320 Aphasia following cerebral infarction: Secondary | ICD-10-CM

## 2014-11-11 DIAGNOSIS — R482 Apraxia: Secondary | ICD-10-CM

## 2014-11-11 NOTE — Therapy (Signed)
[facility] [address] [place], [place], [address] Phone: [phone]   Fax:  [phone]  Speech Language Pathology Treatment  Patient Details  Name: Rebecca Russo MRN: [id number] Date of Birth: 1975/06/07 Referring Provider:  Marvel Plan, MD  Encounter Date: 11/11/2014      End of Session - 11/11/14 1648    Visit Number 27   Number of Visits 36   Date for SLP Re-Evaluation 12/07/14   SLP Start Time 1320   SLP Stop Time  1401   SLP Time Calculation (min) 41 min   Activity Tolerance Patient tolerated treatment well      Past Medical History  Diagnosis Date  . H/O varicella   . Increased BMI 12/17/08  . Heart defect   . Stroke 05/29/2014    "weaker in right arm and speech problems since" (07/09/2014)  . Acute respiratory failure with hypoxia   . Aphasia complicating stroke   . Hemiplegia affecting dominant side   . Ischemic brain damage   . Heart defect     Past Surgical History  Procedure Laterality Date  . Laparoscopic cholecystectomy  06/20/2012  . Radiology with anesthesia N/A 05/29/2014    Procedure: RADIOLOGY WITH ANESTHESIA;  Surgeon: Oneal Grout, MD;  Location: [facility] OR;  Service: Radiology;  Laterality: N/A;  . Tee without cardioversion N/A 06/03/2014    Procedure: TRANSESOPHAGEAL ECHOCARDIOGRAM (TEE);  Surgeon: Wendall Stade, MD;  Location: [facility] ENDOSCOPY;  Service: Cardiovascular;  Laterality: N/A;  . Patent foramen ovale closure  07/09/2014  . Patent foramen ovale closure N/A 07/09/2014    Procedure: PATENT FORAMEN OVALE CLOSURE;  Surgeon: Micheline Chapman, MD;  Location: [facility] CATH LAB;  Service: Cardiovascular;  Laterality: N/A;    There were no vitals filed for this visit.  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  Apraxia of speech      Subjective Assessment - 11/11/14 1327    Subjective Pt reports she got a fat tire over the weekend. She entered the room with a  bracelet on her rt wrist for an external cue for reduced rate. "Next week I'm going to find another (bracelet) and put it on my other arm so it won't feel the same."               ADULT SLP TREATMENT - 11/11/14 1324    General Information   Behavior/Cognition Alert;Pleasant mood;Cooperative   Treatment Provided   Treatment provided Cognitive-Linquistic   Pain Assessment   Pain Assessment No/denies pain   Cognitive-Linquistic Treatment   Treatment focused on Aphasia;Apraxia   Skilled Treatment Initial conversation (mod complex) with SLP was WFL/WNL. SLP enagaged in mod complex/complex conversation outside ST room today for 25 minutes with pt maintaining reduced rate for approx 80% of that time. When pt's rate increased notably, she self corrected 2/2 times. Pt rephrased appropriately as a compensatory strategy, and needed encouragement that this is an appropriate strategy - pt was concerned that this would draw attention to her speech/language, SLP assured her rephrasing was a WNL strategy people without CVA use.   Assessment / Recommendations / Plan   Plan Continue with current plan of care   Progression Toward Goals   Progression toward goals Progressing toward goals          SLP Education - 11/11/14 1647    Education provided Yes   Education Details compensatory techniques (revisions) are used by people wihtout CVA as well as with CVA  Person(s) Educated Patient   Methods Explanation   Comprehension Verbalized understanding          SLP Short Term Goals - 10/06/14 1152    SLP SHORT TERM GOAL #1   Title Pt will perform simple convergent and divergent naming tasks with 85% accuracy and occassional minimal assistance (due 07/10/14)   Status Achieved   SLP SHORT TERM GOAL #2   Title Pt will demonstrate auditory comprehension of simple conversation and questions with 85% accuracy and occassional minimal assistance (07/10/14)   Status Achieved   SLP SHORT TERM GOAL #3    Title Pt will id object to phrase/sentence description with 85% accuracy and rare minimal assistance (07/10/14)   Status Achieved   SLP SHORT TERM GOAL #4   Title ID object/picture to written description f:4 with 80% acccuracy and rare minimal assistance (07/10/14)   Status Achieved   SLP SHORT TERM GOAL #5   Title pt to write sentences with modified independence (extra time) (enforced week of 09-28-14)   Time 4   Period Weeks   Status New   SLP SHORT TERM GOAL #6   Title pt to engage in mod complex/complex conversation with rare cues   Time 4   Period Weeks   Status New          SLP Long Term Goals - 11/11/14 1334    SLP LONG TERM GOAL #1   Title Demonsrate compensations for aphasia during simple conversation 80% of opportunities and occassional minimal assistance (08/04/14)   Status Achieved   SLP LONG TERM GOAL #2   Title Participate in simple covnersation verbally with 80% accuracy and occassional minimal assistance (08/04/14)   Status Achieved   SLP LONG TERM GOAL #3   Title Comprehend simple 3-4 sentence paragraph with 80% accuracy and rare min A. (08/04/14)   Baseline 09/07/14   Status Achieved   SLP LONG TERM GOAL #4   Title Perform simple functional written expression tasks with 80% accuracy and occassional minimal A (08/04/14)   Status Achieved   SLP LONG TERM GOAL #5   Title pt will perform 2-3 sentence writing tasks with 85% success and occasional min A   Baseline beginning week of 09-28-14   Time 4   Period Weeks   Status On-going   SLP LONG TERM GOAL #6   Title pt to engage in 10 minutes mod complex conversation with occasional min A    Status Achieved   SLP LONG TERM GOAL #7   Title pt to engage in 10 minutes mod complex/complex conversation with occasional min A   Time 2   Period Weeks   Status Achieved   SLP LONG TERM GOAL #8   Title engage in 15 minutes of complex/complex conversation with compensations used with supervision cues over 2-3 sessions   Baseline  begining week of 09-28-14   Time 4   Period Weeks   Status Revised          Plan - 11/11/14 1648    Clinical Impression Statement Pt has improved mod complex/complex language flow today than previous weeks - language/speech is WFL at least. Pt cont to need skilled ST to ensure consistency. She uses slowed rate more successfully now with her use of external cues. Suspect pt will need  1-3 more weeks of therapy prior to d/c.   Speech Therapy Frequency 2x / week   Duration --  5 weeks   Treatment/Interventions Language facilitation;Cueing hierarchy;Functional tasks;Patient/family education;Compensatory strategies;SLP instruction and feedback  Potential to Achieve Goals Good        Problem List Patient Active Problem List   Diagnosis Date Noted  . Morbid obesity 06/04/2014  . Dysphagia, pharyngoesophageal phase 06/04/2014  . PFO with atrial septal aneurysm 06/04/2014  . Hyperlipidemia LDL goal <70 06/04/2014  . Ischemic brain damage   . Acute respiratory failure with hypoxia   . CVA (cerebral vascular accident) 05/29/2014  . Aphasia complicating stroke 05/29/2014  . Hemiplegia affecting dominant side 05/29/2014  . CONSTIPATION 03/09/2008  . IRRITABLE BOWEL SYNDROME 03/09/2008    [facility] , MS, CCC-SLP  11/11/2014, 4:52 PM  [facility] [address] [place], [place], [address] Phone: [phone]   Fax:  [phone]

## 2014-11-11 NOTE — Patient Instructions (Signed)
Talk talk talk talk talk to people!

## 2014-11-12 ENCOUNTER — Ambulatory Visit: Payer: Managed Care, Other (non HMO) | Admitting: Speech Pathology

## 2014-11-12 ENCOUNTER — Ambulatory Visit: Payer: Managed Care, Other (non HMO)

## 2014-11-12 DIAGNOSIS — R482 Apraxia: Secondary | ICD-10-CM

## 2014-11-12 DIAGNOSIS — I69320 Aphasia following cerebral infarction: Secondary | ICD-10-CM

## 2014-11-12 NOTE — Therapy (Signed)
[facility] [address] [place], [place], [address] Phone: [phone]   Fax:  [phone]  Speech Language Pathology Treatment  Patient Details  Name: Rebecca Russo MRN: [id number] Date of Birth: [birth year] Referring Provider:  Marvel Plan, MD  Encounter Date: 11/12/2014      End of Session - 11/12/14 1356    Visit Number 28   Number of Visits 36   Date for SLP Re-Evaluation 12/07/14   SLP Start Time 1319   SLP Stop Time  1346   SLP Time Calculation (min) 27 min   Activity Tolerance Patient tolerated treatment well      Past Medical History  Diagnosis Date  . H/O varicella   . Increased BMI 12/17/08  . Heart defect   . Stroke 05/29/2014    "weaker in right arm and speech problems since" (07/09/2014)  . Acute respiratory failure with hypoxia   . Aphasia complicating stroke   . Hemiplegia affecting dominant side   . Ischemic brain damage   . Heart defect     Past Surgical History  Procedure Laterality Date  . Laparoscopic cholecystectomy  06/20/2012  . Radiology with anesthesia N/A 05/29/2014    Procedure: RADIOLOGY WITH ANESTHESIA;  Surgeon: Oneal Grout, MD;  Location: [facility] OR;  Service: Radiology;  Laterality: N/A;  . Tee without cardioversion N/A 06/03/2014    Procedure: TRANSESOPHAGEAL ECHOCARDIOGRAM (TEE);  Surgeon: Wendall Stade, MD;  Location: [facility] ENDOSCOPY;  Service: Cardiovascular;  Laterality: N/A;  . Patent foramen ovale closure  07/09/2014  . Patent foramen ovale closure N/A 07/09/2014    Procedure: PATENT FORAMEN OVALE CLOSURE;  Surgeon: Micheline Chapman, MD;  Location: [facility] CATH LAB;  Service: Cardiovascular;  Laterality: N/A;    There were no vitals filed for this visit.  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  Apraxia of speech      Subjective Assessment - 11/11/14 1327    Subjective Pt reports she got a fat tire over the weekend. She entered the room with a  bracelet on her rt wrist for an external cue for reduced rate. "Next week I'm going to find another (bracelet) and put it on my other arm so it won't feel the same."               ADULT SLP TREATMENT - 11/12/14 1345    General Information   Behavior/Cognition Alert;Pleasant mood;Cooperative   Treatment Provided   Treatment provided Cognitive-Linquistic   Pain Assessment   Pain Assessment No/denies pain   Cognitive-Linquistic Treatment   Treatment focused on Aphasia   Skilled Treatment SLP facilitated complex/mod complex conversation with pt to assess pt's ability in that level conversation. Pt demonstrated success at message conveyance at 95-98%. Skilled observation ID'd abandonment x1, and good use of reduced rate in 30 minutes conversation.   Assessment / Recommendations / Plan   Plan --  REduce frequency to x1/week   Progression Toward Goals   Progression toward goals Progressing toward goals          SLP Education - 11/11/14 1647    Education provided Yes   Education Details compensatory techniques (revisions) are used by people wihtout CVA as well as with CVA   Person(s) Educated Patient   Methods Explanation   Comprehension Verbalized understanding          SLP Short Term Goals - 10/06/14 1152    SLP SHORT TERM GOAL #1   Title Pt will perform  simple convergent and divergent naming tasks with 85% accuracy and occassional minimal assistance (due 07/10/14)   Status Achieved   SLP SHORT TERM GOAL #2   Title Pt will demonstrate auditory comprehension of simple conversation and questions with 85% accuracy and occassional minimal assistance (07/10/14)   Status Achieved   SLP SHORT TERM GOAL #3   Title Pt will id object to phrase/sentence description with 85% accuracy and rare minimal assistance (07/10/14)   Status Achieved   SLP SHORT TERM GOAL #4   Title ID object/picture to written description f:4 with 80% acccuracy and rare minimal assistance (07/10/14)   Status  Achieved   SLP SHORT TERM GOAL #5   Title pt to write sentences with modified independence (extra time) (enforced week of 09-28-14)   Time 4   Period Weeks   Status New   SLP SHORT TERM GOAL #6   Title pt to engage in mod complex/complex conversation with rare cues   Time 4   Period Weeks   Status New          SLP Long Term Goals - 11/11/14 1334    SLP LONG TERM GOAL #1   Title Demonsrate compensations for aphasia during simple conversation 80% of opportunities and occassional minimal assistance (08/04/14)   Status Achieved   SLP LONG TERM GOAL #2   Title Participate in simple covnersation verbally with 80% accuracy and occassional minimal assistance (08/04/14)   Status Achieved   SLP LONG TERM GOAL #3   Title Comprehend simple 3-4 sentence paragraph with 80% accuracy and rare min A. (08/04/14)   Baseline 09/07/14   Status Achieved   SLP LONG TERM GOAL #4   Title Perform simple functional written expression tasks with 80% accuracy and occassional minimal A (08/04/14)   Status Achieved   SLP LONG TERM GOAL #5   Title pt will perform 2-3 sentence writing tasks with 85% success and occasional min A   Baseline beginning week of 09-28-14   Time 4   Period Weeks   Status On-going   SLP LONG TERM GOAL #6   Title pt to engage in 10 minutes mod complex conversation with occasional min A    Status Achieved   SLP LONG TERM GOAL #7   Title pt to engage in 10 minutes mod complex/complex conversation with occasional min A   Time 2   Period Weeks   Status Achieved   SLP LONG TERM GOAL #8   Title engage in 15 minutes of complex/complex conversation with compensations used with supervision cues over 2-3 sessions   Baseline begining week of 09-28-14   Time 4   Period Weeks   Status Revised          Plan - 11/11/14 1648    Clinical Impression Statement Pt has improved mod complex/complex language flow today than previous weeks - language/speech is WFL at least. Pt cont to need skilled ST  to ensure consistency. She uses slowed rate more successfully now with her use of external cues. Suspect pt will need  1-3 more weeks of therapy prior to d/c.   Speech Therapy Frequency 2x / week   Duration --  5 weeks   Treatment/Interventions Language facilitation;Cueing hierarchy;Functional tasks;Patient/family education;Compensatory strategies;SLP instruction and feedback   Potential to Achieve Goals Good        Problem List Patient Active Problem List   Diagnosis Date Noted  . Morbid obesity 06/04/2014  . Dysphagia, pharyngoesophageal phase 06/04/2014  . PFO with atrial septal  aneurysm 06/04/2014  . Hyperlipidemia LDL goal <70 06/04/2014  . Ischemic brain damage   . Acute respiratory failure with hypoxia   . CVA (cerebral vascular accident) 05/29/2014  . Aphasia complicating stroke 05/29/2014  . Hemiplegia affecting dominant side 05/29/2014  . CONSTIPATION 03/09/2008  . IRRITABLE BOWEL SYNDROME 03/09/2008    [facility] , MS, CCC-SLP  11/12/2014, 1:57 PM  [facility] [address] [place], [place], [address] Phone: [phone]   Fax:  [phone]

## 2014-11-17 ENCOUNTER — Ambulatory Visit: Payer: Managed Care, Other (non HMO) | Attending: Neurology

## 2014-11-17 DIAGNOSIS — I69320 Aphasia following cerebral infarction: Secondary | ICD-10-CM | POA: Diagnosis present

## 2014-11-17 DIAGNOSIS — R482 Apraxia: Secondary | ICD-10-CM

## 2014-11-17 DIAGNOSIS — R488 Other symbolic dysfunctions: Secondary | ICD-10-CM | POA: Insufficient documentation

## 2014-11-17 NOTE — Therapy (Signed)
[facility] [address] [place], [place], [address] Phone: [phone]   Fax:  [phone]  Speech Language Pathology Treatment  Patient Details  Name: Rebecca Russo MRN: [id number] Date of Birth: 09/21/1975 Referring Provider:  Marvel Plan, MD  Encounter Date: 11/17/2014      End of Session - 11/17/14 1246    Visit Number 29   Number of Visits 36   Date for SLP Re-Evaluation 12/07/14   SLP Start Time 1151   SLP Stop Time  1230   SLP Time Calculation (min) 39 min   Activity Tolerance Patient tolerated treatment well      Past Medical History  Diagnosis Date  . H/O varicella   . Increased BMI 12/17/08  . Heart defect   . Stroke 05/29/2014    "weaker in right arm and speech problems since" (07/09/2014)  . Acute respiratory failure with hypoxia   . Aphasia complicating stroke   . Hemiplegia affecting dominant side   . Ischemic brain damage   . Heart defect     Past Surgical History  Procedure Laterality Date  . Laparoscopic cholecystectomy  06/20/2012  . Radiology with anesthesia N/A 05/29/2014    Procedure: RADIOLOGY WITH ANESTHESIA;  Surgeon: Oneal Grout, MD;  Location: [facility] OR;  Service: Radiology;  Laterality: N/A;  . Tee without cardioversion N/A 06/03/2014    Procedure: TRANSESOPHAGEAL ECHOCARDIOGRAM (TEE);  Surgeon: Wendall Stade, MD;  Location: [facility] ENDOSCOPY;  Service: Cardiovascular;  Laterality: N/A;  . Patent foramen ovale closure  07/09/2014  . Patent foramen ovale closure N/A 07/09/2014    Procedure: PATENT FORAMEN OVALE CLOSURE;  Surgeon: Micheline Chapman, MD;  Location: [facility] CATH LAB;  Service: Cardiovascular;  Laterality: N/A;    There were no vitals filed for this visit.  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  Apraxia of speech                 SLP Short Term Goals - 11/17/14 1248    SLP SHORT TERM GOAL #1   Title Pt will perform simple convergent and  divergent naming tasks with 85% accuracy and occassional minimal assistance (due 07/10/14)   Status Achieved   SLP SHORT TERM GOAL #2   Title Pt will demonstrate auditory comprehension of simple conversation and questions with 85% accuracy and occassional minimal assistance (07/10/14)   Status Achieved   SLP SHORT TERM GOAL #3   Title Pt will id object to phrase/sentence description with 85% accuracy and rare minimal assistance (07/10/14)   Status Achieved   SLP SHORT TERM GOAL #4   Title ID object/picture to written description f:4 with 80% acccuracy and rare minimal assistance (07/10/14)   Status Achieved   SLP SHORT TERM GOAL #5   Title pt to write sentences with modified independence (extra time) (enforced week of 09-28-14)   Time 4   Period Weeks   Status On-going   SLP SHORT TERM GOAL #6   Title pt to engage in mod complex/complex conversation with rare cues   Status Achieved          SLP Long Term Goals - 11/17/14 1246    SLP LONG TERM GOAL #1   Title Demonsrate compensations for aphasia during simple conversation 80% of opportunities and occassional minimal assistance (08/04/14)   Status Achieved   SLP LONG TERM GOAL #2   Title Participate in simple covnersation verbally with 80% accuracy and occassional minimal assistance (08/04/14)  Status Achieved   SLP LONG TERM GOAL #3   Title Comprehend simple 3-4 sentence paragraph with 80% accuracy and rare min A. (08/04/14)   Baseline 09/07/14   Status Achieved   SLP LONG TERM GOAL #4   Title Perform simple functional written expression tasks with 80% accuracy and occassional minimal A (08/04/14)   Status Achieved   SLP LONG TERM GOAL #5   Title pt will perform 2-3 sentence writing tasks with 85% success and occasional min A   Baseline beginning week of 09-28-14   Time 4   Period Weeks   Status On-going   SLP LONG TERM GOAL #6   Title pt to engage in 10 minutes mod complex conversation with occasional min A    Status Achieved    SLP LONG TERM GOAL #7   Title pt to engage in 10 minutes mod complex/complex conversation with occasional min A   Time 2   Period Weeks   Status Achieved   SLP LONG TERM GOAL #8   Title engage in 15 minutes of complex/complex conversation with compensations used with supervision cues over 2-3 sessions   Baseline begining week of 09-28-14   Time 4   Period Weeks   Status Revised          Plan - 11/17/14 1153    Clinical Impression Statement Pt spoke with mod complex/complex conversation for the entire session today, with good success. Rate was slower than WNL, however pt was effective with her communcation 100%. Suspect one more session before d/c.   Speech Therapy Frequency 1x /week   Duration 4 weeks   Treatment/Interventions Language facilitation;Cueing hierarchy;Functional tasks;Patient/family education;Compensatory strategies;SLP instruction and feedback   Potential to Achieve Goals Good        Problem List Patient Active Problem List   Diagnosis Date Noted  . Morbid obesity 06/04/2014  . Dysphagia, pharyngoesophageal phase 06/04/2014  . PFO with atrial septal aneurysm 06/04/2014  . Hyperlipidemia LDL goal <70 06/04/2014  . Ischemic brain damage   . Acute respiratory failure with hypoxia   . CVA (cerebral vascular accident) 05/29/2014  . Aphasia complicating stroke 05/29/2014  . Hemiplegia affecting dominant side 05/29/2014  . CONSTIPATION 03/09/2008  . IRRITABLE BOWEL SYNDROME 03/09/2008    [facility] , MS, CCC-SLP  11/17/2014, 12:48 PM  [facility] [address] [place], [place], [address] Phone: [phone]   Fax:  [phone]

## 2014-11-23 ENCOUNTER — Ambulatory Visit: Payer: Managed Care, Other (non HMO)

## 2014-11-23 DIAGNOSIS — I69320 Aphasia following cerebral infarction: Secondary | ICD-10-CM | POA: Diagnosis not present

## 2014-11-23 DIAGNOSIS — R482 Apraxia: Secondary | ICD-10-CM

## 2014-11-23 NOTE — Patient Instructions (Signed)
Practice by talking with people.  "Lay it on the table" with people that you may have little hiccups in your speech every now and again, if you feel nervous due to your thinking they are keeping track of your talking.   Practice with hard topics with your family to give you cont'd practice with those level topics.  Continue to "be bold" with people. YOU HAVE THE TOOLS! :)

## 2014-11-23 NOTE — Therapy (Signed)
[facility] [address] [place], [place], [address] Phone: [phone]   Fax:  [phone]  Speech Language Pathology Treatment  Patient Details  Name: Rebecca Russo MRN: [id number] Date of Birth: 06-15-1975 Referring Provider:  Rosalin Hawking, MD  Encounter Date: 11/23/2014      End of Session - 11/23/14 1155    Visit Number 30   Number of Visits 36   Date for SLP Re-Evaluation 12/07/14   SLP Start Time 1103   SLP Stop Time  1147   SLP Time Calculation (min) 44 min   Activity Tolerance Patient tolerated treatment well      Past Medical History  Diagnosis Date  . H/O varicella   . Increased BMI 12/17/08  . Heart defect   . Stroke 05/29/2014    "weaker in right arm and speech problems since" (07/09/2014)  . Acute respiratory failure with hypoxia   . Aphasia complicating stroke   . Hemiplegia affecting dominant side   . Ischemic brain damage   . Heart defect     Past Surgical History  Procedure Laterality Date  . Laparoscopic cholecystectomy  06/20/2012  . Radiology with anesthesia N/A 05/29/2014    Procedure: RADIOLOGY WITH ANESTHESIA;  Surgeon: Rob Hickman, MD;  Location: [facility];  Service: Radiology;  Laterality: N/A;  . Tee without cardioversion N/A 06/03/2014    Procedure: TRANSESOPHAGEAL ECHOCARDIOGRAM (TEE);  Surgeon: Josue Hector, MD;  Location: [facility] ENDOSCOPY;  Service: Cardiovascular;  Laterality: N/A;  . Patent foramen ovale closure  07/09/2014  . Patent foramen ovale closure N/A 07/09/2014    Procedure: PATENT FORAMEN OVALE CLOSURE;  Surgeon: Blane Ohara, MD;  Location: [facility] CATH LAB;  Service: Cardiovascular;  Laterality: N/A;    There were no vitals filed for this visit.  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                ADULT SLP TREATMENT - 11/23/14 1106    General Information   Behavior/Cognition Alert;Pleasant mood;Cooperative   Treatment Provided   Treatment provided Cognitive-Linquistic   Pain Assessment   Pain Assessment No/denies pain   Cognitive-Linquistic Treatment   Treatment focused on Aphasia   Skilled Treatment SLP facilitated complex/mod complex conversation with the patient to assess her ability in that conversational context - pt success with conveying her intended message 95-100% of the time. No episodes of abandonment of utterance were observed in SLP's skilled observation during conversation today. Pt reports she is satisfied with her writing/typing at this time.SLP educated pt re: how to practice "problem" words (e.g. "velcro") due to verbal apraxia.   Assessment / Recommendations / Plan   Plan Discharge SLP treatment due to (comment)  pt with plateau in progress at this time; met ST goals   Progression Toward Goals   Progression toward goals Goals met, education completed, patient discharged from [place] [facility] - 11/23/14 1154    Education provided Yes   Education Details compensatory techniques for difficult situations, how best to practice "problem" words"   Person(s) Educated Patient   Methods Explanation   Comprehension Verbalized understanding          SLP Short Term Goals - 11/17/14 1248    SLP  SHORT TERM GOAL #1   Title Pt will perform simple convergent and divergent naming tasks with 85% accuracy and occassional minimal assistance (due 07/10/14)   Status Achieved   SLP SHORT TERM GOAL #2   Title Pt will demonstrate auditory comprehension of simple conversation and questions with 85% accuracy and occassional minimal assistance (07/10/14)   Status Achieved   SLP SHORT TERM GOAL #3   Title Pt will id object to phrase/sentence description with 85% accuracy and rare minimal assistance (07/10/14)   Status Achieved   SLP SHORT TERM GOAL  #4   Title ID object/picture to written description f:4 with 80% acccuracy and rare minimal assistance (07/10/14)   Status Achieved   SLP SHORT TERM GOAL #5   Title pt to write sentences with modified independence (extra time) (enforced week of 09-28-14)   Time 4   Period Weeks   Status Not met   SLP SHORT TERM GOAL #6   Title pt to engage in mod complex/complex conversation with rare cues   Status Achieved          SLP Long Term Goals - 11/23/14 1156    SLP LONG TERM GOAL #1   Title Demonsrate compensations for aphasia during simple conversation 80% of opportunities and occassional minimal assistance (08/04/14)   Status Achieved   SLP LONG TERM GOAL #2   Title Participate in simple covnersation verbally with 80% accuracy and occassional minimal assistance (08/04/14)   Status Achieved   SLP LONG TERM GOAL #3   Title Comprehend simple 3-4 sentence paragraph with 80% accuracy and rare min A. (08/04/14)   Baseline 09/07/14   Status Achieved   SLP LONG TERM GOAL #4   Title Perform simple functional written expression tasks with 80% accuracy and occassional minimal A (08/04/14)   Status Achieved   SLP LONG TERM GOAL #5   Title pt will perform 2-3 sentence writing tasks with 85% success and occasional min A   Baseline beginning week of 09-28-14   Status Achieved  (pt reported meeting goal)   SLP LONG TERM GOAL #6   Title pt to engage in 10 minutes mod complex conversation with occasional min A    Status Achieved   SLP LONG TERM GOAL #7   Title pt to engage in 10 minutes mod complex/complex conversation with occasional min A   Time 2   Period Weeks   Status Achieved   SLP LONG TERM GOAL #8   Title engage in 15 minutes of complex/complex conversation with compensations used with supervision cues over 2-3 sessions   Baseline begining week of 09-28-14   Time 4   Period Weeks   Status Achieved          Plan - 11/23/14 1155    Clinical Impression Statement Pt spoke with mod  complex/complex conversation for the entire session today, with good success. Rate was slower than WNL; pt effectiveness with her communcation 98-100%. Discharge appropriate at this time. Progress has plateaued.   Treatment/Interventions Language facilitation;Cueing hierarchy;Functional tasks;Patient/family education;Compensatory strategies;SLP instruction and feedback   Potential to Achieve Goals Good       SPEECH THERAPY DISCHARGE SUMMARY  Visits from Start of Care: 30  Current functional level related to goals / functional outcomes: Pt is currently able to carry on a mod complex/complex conversation with SLP for approx 25 minutes with message conveyance at 95-98%. Pt is most fluent when she controls her rate, which is improved with external cues that she has consistently used  in this last 3-4 weeks of therapy sessions. See "SLP short (or "long") term goals" for progress on therapy goals.   Remaining deficits: Mild verbal apraxia, mild expressive aphasia   Education / Equipment: Compensations for apraxia and aphasia, practice methods and suggestions.  Plan: Patient agrees to discharge.  Patient goals were met. Patient is being discharged due to meeting the stated rehab goals.  ?????(and plateau in therapy progress)  Pt could return at a later date, likely no earlier than three months from today's discharge, for a short "refresher" therapy course of 2-4 weeks.       Problem List Patient Active Problem List   Diagnosis Date Noted  . Morbid obesity 06/04/2014  . Dysphagia, pharyngoesophageal phase 06/04/2014  . PFO with atrial septal aneurysm 06/04/2014  . Hyperlipidemia LDL goal <70 06/04/2014  . Ischemic brain damage   . Acute respiratory failure with hypoxia   . CVA (cerebral vascular accident) 05/29/2014  . Aphasia complicating stroke 37/19/0707  . Hemiplegia affecting dominant side 05/29/2014  . CONSTIPATION 03/09/2008  . IRRITABLE BOWEL SYNDROME 03/09/2008     [facility] , MS, CCC-SLP  11/23/2014, 11:57 AM  [place] [address] [place], [place], [address] Phone: [phone]   Fax:  [phone]

## 2014-11-24 ENCOUNTER — Ambulatory Visit: Payer: Managed Care, Other (non HMO)

## 2014-12-21 DIAGNOSIS — Z0289 Encounter for other administrative examinations: Secondary | ICD-10-CM

## 2014-12-31 ENCOUNTER — Telehealth: Payer: Self-pay | Admitting: Cardiovascular Disease

## 2014-12-31 DIAGNOSIS — I639 Cerebral infarction, unspecified: Secondary | ICD-10-CM

## 2014-12-31 DIAGNOSIS — Q211 Atrial septal defect: Secondary | ICD-10-CM

## 2014-12-31 DIAGNOSIS — Q2112 Patent foramen ovale: Secondary | ICD-10-CM

## 2014-12-31 NOTE — Telephone Encounter (Signed)
Per pt's mother:  Pt passed out Tue.16th 2016 for 5 min. Per pt's mom pt has been having many problems seen surgery earlier this year.  Pt needs an apt soon and an Echo.  Pt feels as though she stops breathing when she lays down.  Pt has lost a lot of weight and is not eating.

## 2014-12-31 NOTE — Telephone Encounter (Signed)
Pt. Made aware that her concerns will be shared with Dr. Excell Seltzer when he returns to the office but in the meantime she needs to contact Dr. [name] (Neurology) and let them know what has happened. Pt aware that we will call her if Dr. Excell Seltzer has any suggestions or orders for her. Pt expressed understanding and no additional questions at this time.

## 2015-01-08 NOTE — Telephone Encounter (Signed)
Left message on machine for pt or mother to contact the office and schedule appointment with Dr Excell Seltzer on 01/12/15.

## 2015-01-11 NOTE — Telephone Encounter (Signed)
I spoke with the pt's mother and made her aware that I have scheduled the pt for an Echo with bubble study on 01/14/15 and office visit with Dr Excell Seltzer 01/15/15.

## 2015-01-14 ENCOUNTER — Ambulatory Visit (HOSPITAL_COMMUNITY): Payer: Managed Care, Other (non HMO) | Attending: Cardiology

## 2015-01-14 DIAGNOSIS — I639 Cerebral infarction, unspecified: Secondary | ICD-10-CM | POA: Diagnosis not present

## 2015-01-14 DIAGNOSIS — Q211 Atrial septal defect: Secondary | ICD-10-CM

## 2015-01-14 DIAGNOSIS — Q2112 Patent foramen ovale: Secondary | ICD-10-CM

## 2015-01-15 ENCOUNTER — Encounter: Payer: Self-pay | Admitting: Cardiovascular Disease

## 2015-01-15 ENCOUNTER — Ambulatory Visit (INDEPENDENT_AMBULATORY_CARE_PROVIDER_SITE_OTHER): Payer: Managed Care, Other (non HMO) | Admitting: Cardiovascular Disease

## 2015-01-15 VITALS — BP 130/80 | HR 68 | Ht 70.0 in | Wt 285.0 lb

## 2015-01-15 DIAGNOSIS — Q2112 Patent foramen ovale: Secondary | ICD-10-CM

## 2015-01-15 DIAGNOSIS — I253 Aneurysm of heart: Secondary | ICD-10-CM

## 2015-01-15 DIAGNOSIS — Q211 Atrial septal defect: Secondary | ICD-10-CM

## 2015-01-15 NOTE — Progress Notes (Signed)
Cardiology Office Note Date:  01/17/2015   ID:  Rebecca, Russo 06-15-1975, MRN [id number]  PCP:  Marvel Plan, MD  Cardiologist:  Tonny Bollman, MD    Chief Complaint  Patient presents with  . Loss of Consciousness   History of Present Illness: Rebecca Russo is a 39 y.o. female who presents for follow-up evaluation. She underwent transcatheter PFO closure 07/09/2014. She initially presented in January with a left MCA infarct and she underwent mechanical thrombectomy. During the course of her evaluation, there was no clear etiology of her stroke except for the presence of a large PFO and associated atrial septal aneurysm. Other extensive evaluation showed no evidence of a hypercoagulable disorder or any vascular disease.  States she has 'good days and bad days.' No chest pain or shortness of breath with light activities. Has DOE with stairs or hills. No edema, orthopnea, or PND. Very rare palpitations.   She had an episode of syncope a few weeks ago. She hadn't eaten in several hours and was standing up in conversation with a friend. Began to feel shaky, dizzy, tunnel-vision, then lost consciousness. Denies any injury and no recurrence.    Past Medical History  Diagnosis Date  . H/O varicella   . Increased BMI 12/17/08  . Heart defect   . Stroke 05/29/2014    "weaker in right arm and speech problems since" (07/09/2014)  . Acute respiratory failure with hypoxia   . Aphasia complicating stroke   . Hemiplegia affecting dominant side   . Ischemic brain damage   . Heart defect     Past Surgical History  Procedure Laterality Date  . Laparoscopic cholecystectomy  06/20/2012  . Radiology with anesthesia N/A 05/29/2014    Procedure: RADIOLOGY WITH ANESTHESIA;  Surgeon: Oneal Grout, MD;  Location: [facility] OR;  Service: Radiology;  Laterality: N/A;  . Tee without cardioversion N/A 06/03/2014    Procedure: TRANSESOPHAGEAL ECHOCARDIOGRAM (TEE);  Surgeon: Wendall Stade, MD;  Location: [facility]  ENDOSCOPY;  Service: Cardiovascular;  Laterality: N/A;  . Patent foramen ovale closure  07/09/2014  . Patent foramen ovale closure N/A 07/09/2014    Procedure: PATENT FORAMEN OVALE CLOSURE;  Surgeon: Micheline Chapman, MD;  Location: [facility] CATH LAB;  Service: Cardiovascular;  Laterality: N/A;    Current Outpatient Prescriptions  Medication Sig Dispense Refill  . aspirin 81 MG tablet Take 1 tablet (81 mg total) by mouth daily.    Marland Kitchen [name] 0.35 MG tablet     . citalopram (CELEXA) 10 MG tablet Take 1 tablet (10 mg total) by mouth daily. 90 tablet 3  . clopidogrel (PLAVIX) 75 MG tablet Take 1 tablet (75 mg total) by mouth daily. 30 tablet 11  . Multiple Vitamins-Minerals (MULTIVITAMIN WITH MINERALS) tablet Take 1 tablet by mouth daily.    . propranolol (INDERAL) 80 MG tablet Take 1 tablet by mouth daily.    . rosuvastatin (CRESTOR) 5 MG tablet Take 1 tablet (5 mg total) by mouth daily at 6 PM. 30 tablet 5   No current facility-administered medications for this visit.    Allergies:   Review of patient's allergies indicates no known allergies.   Social History:  The patient  reports that she has never smoked. She has never used smokeless tobacco. She reports that she does not drink alcohol or use illicit drugs.   Family History:  The patient's  family history includes Hypertension in her father and mother.    ROS:  Please see the history of present  illness.  Otherwise, review of systems is positive for dizziness, passing out, easy bruising, excessive fatigue, snoring.  All other systems are reviewed and negative.    PHYSICAL EXAM: VS:  BP 130/80 mmHg  Pulse 68  Ht 5\' 10"  (1.778 m)  Wt 285 lb (129.275 kg)  BMI 40.89 kg/m2 , BMI Body mass index is 40.89 kg/(m^2). GEN: Well nourished, well developed, in no acute distress HEENT: normal Neck: no JVD, no masses. No carotid bruits Cardiac: RRR without murmur or gallop                Respiratory:  clear to auscultation bilaterally, normal work of  breathing GI: soft, nontender, nondistended, + BS MS: no deformity or atrophy Ext: no pretibial edema, pedal pulses 2+= bilaterally Skin: warm and dry, no rash Neuro:  Strength and sensation are intact Psych: euthymic mood, full affect  EKG:  EKG is ordered today. The ekg ordered today shows NSR with nonspecific T wave abnormality  Recent Labs: 05/31/2014: Magnesium 2.0 07/04/2014: ALT 13; B Natriuretic Peptide 20.6 07/10/2014: BUN <5*; Creatinine, Ser 0.72; Hemoglobin 9.8*; Platelets 362; Potassium 3.3*; Sodium 137   Lipid Panel     Component Value Date/Time   CHOL 173 05/30/2014 1200   TRIG 225* 05/30/2014 1200   HDL 31* 05/30/2014 1200   CHOLHDL 5.6 05/30/2014 1200   VLDL 45* 05/30/2014 1200   LDLCALC 97 05/30/2014 1200      Wt Readings from Last 3 Encounters:  01/15/15 285 lb (129.275 kg)  09/14/14 308 lb 12.8 oz (140.071 kg)  08/11/14 314 lb (142.429 kg)     Cardiac Studies Reviewed: 2D Echo: Study Conclusions  - Left ventricle: The cavity size was normal. Wall thickness was normal. - Atrial septum: The patient is s/p PFO closure. The interatrial septum bows into the righ atrium. No evidence of residual PFO by colorflow doppler or agitated saline contrast.  ASSESSMENT AND PLAN: 1.  PFO s/p Closure: echo reviewed and shows intact septum with device in appropriate position and no residual shunt. No pericardial effusion or other significant abnormalities. Recommend follow-up in one year with a repeat echo. Advised she can likely change to antiplatelet monotherapy with either ASA or plavix at this point. She sees Dr [name] in a few weeks and I will defer to him on which drug he would like her to continue.  2. Syncope: typical features of a neurodpressor/vasovagal event. We reviewed mechanisms and strategies to avoid this in the future.  Overall I think she is doing well. Her speech is markedly improved. I'll see her back in one year.  Current medicines are reviewed  with the patient today.  The patient does not have concerns regarding medicines.  Labs/ tests ordered today include:   Orders Placed This Encounter  Procedures  . EKG 12-Lead   Disposition:   FU one year  Signed, Tonny Bollman, MD  01/17/2015 11:24 AM    [facility] [address] [place], [place], [place]  [address] Phone: [phone]; Fax: [phone]

## 2015-01-15 NOTE — Patient Instructions (Signed)
Medication Instructions:  Your physician recommends that you continue on your current medications as directed. Please refer to the Current Medication list given to you today.  Labwork: No new orders.   Testing/Procedures: Your physician has requested that you have an echocardiogram in 1 YEAR. Echocardiography is a painless test that uses sound waves to create images of your heart. It provides your doctor with information about the size and shape of your heart and how well your heart's chambers and valves are working. This procedure takes approximately one hour. There are no restrictions for this procedure.  Follow-Up: Your physician wants you to follow-up in: 1 YEAR with Dr Cooper.  You will receive a reminder letter in the mail two months in advance. If you don't receive a letter, please call our office to schedule the follow-up appointment.   Any Other Special Instructions Will Be Listed Below (If Applicable).   

## 2015-01-19 ENCOUNTER — Ambulatory Visit: Payer: Managed Care, Other (non HMO) | Admitting: Neurology

## 2015-02-02 ENCOUNTER — Encounter: Payer: Self-pay | Admitting: Neurology

## 2015-02-02 ENCOUNTER — Ambulatory Visit (INDEPENDENT_AMBULATORY_CARE_PROVIDER_SITE_OTHER): Payer: Managed Care, Other (non HMO) | Admitting: Neurology

## 2015-02-02 VITALS — BP 111/75 | HR 69 | Ht 70.0 in | Wt 285.2 lb

## 2015-02-02 DIAGNOSIS — F329 Major depressive disorder, single episode, unspecified: Secondary | ICD-10-CM

## 2015-02-02 DIAGNOSIS — Q211 Atrial septal defect: Secondary | ICD-10-CM | POA: Diagnosis not present

## 2015-02-02 DIAGNOSIS — I639 Cerebral infarction, unspecified: Secondary | ICD-10-CM | POA: Diagnosis not present

## 2015-02-02 DIAGNOSIS — I253 Aneurysm of heart: Secondary | ICD-10-CM

## 2015-02-02 DIAGNOSIS — F32A Depression, unspecified: Secondary | ICD-10-CM | POA: Insufficient documentation

## 2015-02-02 MED ORDER — CITALOPRAM HYDROBROMIDE 20 MG PO TABS: 10.0000 mg | ORAL_TABLET | Freq: Every day | ORAL | Status: DC

## 2015-02-02 MED ORDER — ROSUVASTATIN CALCIUM 5 MG PO TABS: 5.0000 mg | ORAL_TABLET | Freq: Every day | ORAL | Status: DC

## 2015-02-02 NOTE — Progress Notes (Signed)
STROKE NEUROLOGY FOLLOW UP NOTE  NAME: Danelle Salvadore Oxford DOB: [birth year]  REASON FOR VISIT: stroke follow up HISTORY FROM: pt and mom and chart  Today we had the pleasure of seeing Haylo L Mcghee in follow-up at our Neurology [facility]. Pt was accompanied by mom.   History Summary Ms. Jayme L Ritacco is a 39 y.o. Obese female with no significant past medical history presenting with right hemiparesis, aphasia and left gaze on waking up on 06/04/14. The patient did not receive TPA secondary to unknown time of onset. Perfusion scan revealed left brain large penumbra. 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 She is status post mechanical thrombectomy with TICI3 recannulization. Repeat CTA head and neck showed patent vessels. Etiology for stroke not clear, stroke work up so far negative except large PFO with mobile ASA. Risk factor including progestin implant (Nexplanon) use for contraception , obesity, large PFO with mobile ASA.discharged with ASA and crestor for stroke prevention.   07/07/14 follow up - the patient has been doing well. She is going to get speech and PT outpt rehab. She also followed up with Dr. Excell Seltzer and was put on dural antiplatelet and will do the endovascular PFO closure 07/09/14. After discharge, she went to ER twice for left knee pain with nodule and with chest pain which considered to be due to anxiety and stress. She feels great today. Her speech is much better and only has mild right hand dexterity difficulty. She has Nexplanon implant at left arm, which she got just before the stroke and lasts for one year.   09/14/14 follow up - the patient was doing well. She had PFO closure procedure with Dr. Excell Seltzer on 07/09/14 and went successful. She then had 2D echo repeat which showed stable cardiac function. She is to have 2D echo bubble study in June to evaluate the closure device. She was told to be on dural antiplatelet for 6 months after the procedure.   Pt still has speech  therapy 3 times per week, still has deficit with expression but improved from previous. She still has sensory deficit on her right hand. Easily feel tired but also admit the depressed mood and not able to be back to work. Is dealing with long term disability now. She had Nexplanon implant removed but was put on [name] which is progestin-only OCP for birth control.  Interval History During the interval time, the patient had been doing well, no recurrent stroke symptoms. She saw Dr. Excell Seltzer on 01/15/2015 for follow-up, doing well, recommend mono antiplatelet therapy instead of the dural antiplatelet therapy. She still has mild to moderate expressive aphasia, significant hesitancy on speech. Right hand weakness much improved. However, she had significant depression, decreased appetite, decreased concentration attention, memory loss, and social withdrawal. Still on Celexa 10 g daily. She had one episode of syncope last months, consistent with vasovagal events as per Dr. Excell Seltzer.  REVIEW OF SYSTEMS: Full 14 system review of systems performed and notable only for those listed below and in HPI above, all others are negative:  Constitutional:  Activity change, appetite change, fatigue, unexpected weight change Cardiovascular:  Ear/Nose/Throat:   Skin:  Eyes:   Respiratory:  SOB Gastroitestinal:  Nausea Genitourinary:  Hematology/Lymphatic:  Bruises easily Endocrine: Excessive thirst Musculoskeletal:  Walking difficulty Allergy/Immunology:   Neurological:  Memory loss, headache, speech difficulty, weakness, passing out Psychiatric: Depression, nurse, and anxious Sleep: Frequent waking  The following represents the patient's updated allergies and side effects list: No Known Allergies  The  neurologically relevant items on the patient's problem list were reviewed on today's visit.  Neurologic Examination  A problem focused neurological exam (12 or more points of the single system neurologic  examination, vital signs counts as 1 point, cranial nerves count for 8 points) was performed.  Blood pressure 111/75, pulse 69, height 5\' 10"  (1.778 m), weight 285 lb 3.2 oz (129.366 kg).  General - Morbid obesity, well developed, in no apparent distress.  Ophthalmologic - Sharp disc margins OU.  Cardiovascular - Regular rate and rhythm with no murmur.  Mental Status -  Level of arousal and orientation to time, place, and person were intact. Partial expressive aphasia with hesitancy of speech and word finding difficulties. Able to repeat, mild deficit on naming, intact on comprehension.   Cranial Nerves II - XII - II - Visual field intact OU. III, IV, VI - Extraocular movements intact. V - Facial sensation intact bilaterally. VII - Facial movement intact bilaterally. VIII - Hearing & vestibular intact bilaterally. X - Palate elevates symmetrically. XI - Chin turning & shoulder shrug intact bilaterally. XII - Tongue protrusion intact.  Motor Strength - The patient's strength was normal in all extremities and pronator drift was absent except subtle right hand dexterity deficit.  Bulk was normal and fasciculations were absent.   Motor Tone - Muscle tone was assessed at the neck and appendages and was normal.  Reflexes - The patient's reflexes were normal in all extremities and she had no pathological reflexes.  Sensory - Light touch, temperature/pinprick, vibration and proprioception, and Romberg testing were assessed and were normal.    Coordination - The patient had normal movements in the hands and feet with no ataxia or dysmetria.  Tremor was absent.  Gait and Station - The patient's transfers, posture, gait, station, and turns were observed as normal.  Data reviewed: I personally reviewed the images and agree with the radiology interpretations.  Ct Head Wo Contrast 05/30/2014 Subtle CT changes of left MCA territory infarct, most apparent at the insula, with no associated  hemorrhage or mass effect. No new intracranial abnormality.  05/29/2014 Subtle decreased sulcation in the left cerebral hemisphere raising the question for diffuse edema. No evidence for acute intracranial hemorrhage.  05/29/2014 Motion degraded exam showing no definite acute stroke or hemorrhage. Hyperdense LEFT ICA suggesting proximal large vessel occlusion   Ct Cerebral Perfusion W/cm 05/29/2014 There could be a moderate-sized ischemic penumbra in the LEFT hemisphere in this patient with suspected LEFT ICA occlusion. At the time of dictation, the patient is in interventional radiology in preparation for possible thrombolysis.   Cerebral Angiogram 05/29/2014 LT ICA and LT T occlusion in isolated Lt cerebral hemisphere. S/P complete TIC 3 revascularization using 2 passes with Trevoprovue 4 mm x 30 mm retrieval device and 8 mg of superselective intracranial intraarterial Integrelin.  Dg Chest Port 1 View 05/31/2014 Extubation, with low lung volumes but no acute disease. 05/30/2014 Low lung volumes with mild right basilar atelectasis.   Dg Abd Portable 1v 05/29/2014 1. OG tube tip in the proximal body of the stomach. 2. Early/incipient small bowel ileus.   MRI/MRA of the brain Patient refused  CTA head and neck 06/01/14 1. Collected evolution of infarcts involving the left insular cortex and segmental areas of the left anterior MCA distribution. This territory is significantly smaller than the area of ischemia identified on the CT perfusion scan. 2. Persistent patency of the left internal carotid artery. 3. Slight irregularity of the distal left ICA. This may represent  fibromuscular dysplasia. 4. Evidence luxury perfusion within the infarcted territories of the left frontal lobe.  2D Echocardiogram  06/01/14  - Left ventricle: The cavity size was normal. Wall thickness was normal. Systolic function was normal. The estimated ejection fraction was in the range of 55% to  60%. Wall motion was normal; there were no regional wall motion abnormalities. Dopplerparameters are consistent with abnormal left ventricularrelaxation (grade 1 diastolic dysfunction). Impressions: No cardiac source of emboli was indentified.  08/14/14  - Left ventricle: The cavity size was mildly dilated. Wall thickness was normal. Systolic function was normal. The estimated ejection fraction was in the range of 50% to 55%. Wall motion was normal; there were no regional wall motion abnormalities. - Atrial septum: A closure device was present. Impressions: - Limited study to R/O pericardial effusion; low normal LV function; atrial septal closure device noted with no residual shunt; no pericardial effusion.  LE venous doppler - negative for DVT  EKG Sinus rhythm. 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 2) Normal EF 60% 3) No LAA thrombus 4) Trivial MR/AR 5) Normal RV 6) No pericardial effusion 7) No aortic debris    Component     Latest Ref Rng 05/29/2014 05/30/2014 05/30/2014          3:45 AM 12:00 PM  PTT Lupus Anticoagulant     28.0 - 43.0 secs 34.9    PTTLA Confirmation     <8.0 secs NOT APPL    PTTLA 4:1 Mix     28.0 - 43.0 secs NOT APPL    DRVVT     <42.9 secs 35.0    Drvvt confirmation     <1.15 Ratio NOT APPL    dRVVT Incubated 1:1 Mix     <42.9 secs NOT APPL    Lupus Anticoagulant     NOT DETECTED NOT DETECTED    Cholesterol     0 - 200 mg/dL  381 840  Triglycerides     <150 mg/dL  375 (H) 436 (H)  HDL     >39 mg/dL  33 (L) 31 (L)  Total CHOL/HDL Ratio       5.0 5.6  VLDL     0 - 40 mg/dL  43 (H) 45 (H)  LDL (calc)     0 - 99 mg/dL  89 97  Beta-2 Glyco I IgG     <20 G Units 14    Beta-2-Glycoprotein I IgM     <20 M Units 6    Beta-2-Glycoprotein I IgA     <20 A Units 6    Interpretation-F5LEID:      REPORT      Recommendations-F5LEID:      REPORT    Reviewer      REPORT    Interpretation-PTGENE:      REPORT    Recommendations-PTGENE:      REPORT    Reviewer      REPORT    Anticardiolipin IgG     <23 GPL U/mL 3 (L)    Anticardiolipin IgM     <11 MPL U/mL 4 (L)    Anticardiolipin IgA     <22 APL U/mL 10 (L)    c-ANCA Screen     NEGATIVE     p-ANCA Screen     NEGATIVE     Atypical p-ANCA Screen     NEGATIVE  Hemoglobin A1C     <5.7 %  5.6   Mean Plasma Glucose     <117 mg/dL  161   AntiThromb III Func     75 - 120 % 105    Protein C Activity     74 - 151 % 174 (H)    Protein C, Total     70 - 140 % 110    Protein S Activity     60 - 145 % 113    Protein S Ag, Total     58 - 150 % 128    Homocysteine     0.0 - 15.0 umol/L 8.1    ds DNA Ab          C3 Complement     90 - 180 mg/dL     Complement C4, Body Fluid     10 - 40 mg/dL     SSA (Ro) (ENA) Antibody, IgG     <1.0 NEG AI     SSB (La) (ENA) Antibody, IgG     <1.0 NEG AI     Rhuematoid fact SerPl-aCnc     <=14 IU/mL     Anit Nuclear Antibody(ANA)     NEGATIVE     Alpha galactosidase, serum     0.074 - 0.457 U/L     Sickle Cell Screen     NEGATIVE     D-Dimer, Quant     0.00 - 0.48 ug/mL-FEU      Component     Latest Ref Rng 06/02/2014 06/03/2014 06/27/2014            PTT Lupus Anticoagulant     28.0 - 43.0 secs     PTTLA Confirmation     <8.0 secs     PTTLA 4:1 Mix     28.0 - 43.0 secs     DRVVT     <42.9 secs     Drvvt confirmation     <1.15 Ratio     dRVVT Incubated 1:1 Mix     <42.9 secs     Lupus Anticoagulant     NOT DETECTED     Cholesterol     0 - 200 mg/dL     Triglycerides     <096 mg/dL     HDL     >04 mg/dL     Total CHOL/HDL Ratio          VLDL     0 - 40 mg/dL     LDL (calc)     0 - 99 mg/dL     Beta-2 Glyco I IgG     <20 G Units     Beta-2-Glycoprotein I IgM     <20 M Units     Beta-2-Glycoprotein I IgA     <20 A Units     Interpretation-F5LEID:           Recommendations-F5LEID:          Reviewer          Interpretation-PTGENE:          Recommendations-PTGENE:          Reviewer          Anticardiolipin IgG     <23 GPL U/mL     Anticardiolipin IgM     <11 MPL U/mL     Anticardiolipin IgA     <22 APL U/mL     c-ANCA Screen     NEGATIVE NEGATIVE    p-ANCA Screen  NEGATIVE NEGATIVE    Atypical p-ANCA Screen     NEGATIVE NEGATIVE    Hemoglobin A1C     <5.7 %     Mean Plasma Glucose     <117 mg/dL     AntiThromb III Func     75 - 120 %     Protein C Activity     74 - 151 %     Protein C, Total     70 - 140 %     Protein S Activity     60 - 145 %     Protein S Ag, Total     58 - 150 %     Homocysteine     0.0 - 15.0 umol/L     ds DNA Ab      1    C3 Complement     90 - 180 mg/dL 161 (H)    Complement C4, Body Fluid     10 - 40 mg/dL 38    SSA (Ro) (ENA) Antibody, IgG     <1.0 NEG AI <1.0 NEG    SSB (La) (ENA) Antibody, IgG     <1.0 NEG AI <1.0 NEG    Rhuematoid fact SerPl-aCnc     <=14 IU/mL <10    Anit Nuclear Antibody(ANA)     NEGATIVE NEGATIVE    Alpha galactosidase, serum     0.074 - 0.457 U/L 0.262    Sickle Cell Screen     NEGATIVE  NEGATIVE   D-Dimer, Quant     0.00 - 0.48 ug/mL-FEU   0.40    Assessment: As you may recall, she is a 39 y.o. African American obese female with no significant past medical history was admitted on 06/04/14 for left MCA stroke. Pt refused MRI but CT repeat confirmed left MCA stroke. 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 She received mechanical thrombectomy with TICI3 recannulization.Repeat CTA head and neck showed patent vessels. Etiology for stroke not clear, stroke work up including TEE, TTE, CUS, hypercoagulable and autoimmue work up all negative except large PFO with mobile ASA. Risk factor including progestin implant (Nexplanon) use for contraception, obesity, and  large PFO with mobile ASA.Due to lack of risk factors and young age, PFO closure was considered and Dr. Excell Seltzer did the procedure 07/09/14. She has removed Nexplanon implant but was put on progestin-only OCP. Completed dural antiplatelet for 6 months post PFO closure procedure. Pt has post stroke depression, on low-dose Celexa. She follow-up with Dr. Excell Seltzer again and recommend change from dural antiplatelet to mono antiplatelet therapy. Her post stroke depression continues, we'll increase Celexa to 20 mg as well as refer to psychology.  Plan:  - continue plavix and crestor for stroke prevention. - discontinue ASA. - increase celexa to  for post stroke depression - continue speech exercise at home - Follow up with your primary care physician for stroke risk factor modification. Recommend maintain blood pressure goal <130/80, diabetes with hemoglobin A1c goal below 6.5% and lipids with LDL cholesterol goal below 70 mg/dL.  - refer to psychology for post stroke depression. - RTC in 6 months  Orders Placed This Encounter  Procedures  . Ambulatory referral to Psychology    Referral Priority:  Routine    Referral Type:  Psychiatric    Referral Reason:  Specialty Services Required    Requested Specialty:  Psychology    Number of  Visits Requested:  1    Meds ordered this encounter  Medications  . citalopram (CELEXA) 20 MG tablet    Sig: Take 0.5 tablets (10 mg total) by mouth daily.    Dispense:  90 tablet    Refill:  3  . rosuvastatin (CRESTOR) 5 MG tablet    Sig: Take 1 tablet (5 mg total) by mouth daily at 6 PM.    Dispense:  90 tablet    Refill:  3    Patient Instructions  - continue plavix and crestor for stroke prevention. - discontinue ASA for now. - increase celexa to 20mg  for post stroke depression - continue speech exercise at home - Follow up with your primary care physician for stroke risk factor modification. Recommend maintain blood pressure goal <130/80, diabetes  with hemoglobin A1c goal below 6.5% and lipids with LDL cholesterol goal below 70 mg/dL.  - refer to psychology for helping with post stroke depression. - follow up in 6 months    Marvel Plan, MD PhD [facility] [address] [place], [place] [address] [phone]

## 2015-02-02 NOTE — Patient Instructions (Addendum)
-   continue plavix and crestor for stroke prevention. - discontinue ASA for now. - increase celexa to 20mg  for post stroke depression - continue speech exercise at home - Follow up with your primary care physician for stroke risk factor modification. Recommend maintain blood pressure goal <130/80, diabetes with hemoglobin A1c goal below 6.5% and lipids with LDL cholesterol goal below 70 mg/dL.  - refer to psychology for helping with post stroke depression. - follow up in 6 months

## 2015-02-03 ENCOUNTER — Telehealth: Payer: Self-pay | Admitting: *Deleted

## 2015-02-03 NOTE — Telephone Encounter (Signed)
If patient calls back tell her its [facility] policy that all forms are to be filled for disability there is a 25.00 charge,once that's paid the form can be fax to [place].  LFt vm for patient to pay 25.00 for the disability form for [employer]. Rn does have the medical release form sign by patient. The form has been filled out by Dr. [name].

## 2015-02-03 NOTE — Telephone Encounter (Signed)
Form,[employer] sent to Katrina and Dr [name] 02/03/15.

## 2015-02-10 ENCOUNTER — Other Ambulatory Visit (HOSPITAL_COMMUNITY): Payer: Managed Care, Other (non HMO)

## 2015-02-11 NOTE — Telephone Encounter (Signed)
Pts mom came by to pay 25.00 dollars for her daughter.Patients mom stated Dr. [name] had filled out 3 forms in the past year and she has paided those times. Pts mom stated in March 2016 they did not have to pay 25.00 dollars for the form. Rn stated to mom that the manager of [employer] will be notified of this issue,and can a fee be waive at times. Mom was okay with paying and was given a copy of the disability form.

## 2015-02-11 NOTE — Telephone Encounter (Signed)
Rn call patients mom Coralee North about her daughters disability form. Nurse stated to NIna that there is 25.00 charge for the form to be fax to [employer]. Coralee North stated she will come in person to pay the fee and get a copy of the form.

## 2015-02-12 ENCOUNTER — Telehealth: Payer: Self-pay | Admitting: Neurology

## 2015-02-12 ENCOUNTER — Telehealth: Payer: Self-pay | Admitting: *Deleted

## 2015-02-12 DIAGNOSIS — Z0289 Encounter for other administrative examinations: Secondary | ICD-10-CM

## 2015-02-12 NOTE — Telephone Encounter (Signed)
Pt's mother called and is concerned about return to work date that is on form which was sent to [place]. 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 Pt's mother and pt think maybe there was a mistake on the date. Please call and advise. Coralee North , mother, [phone].

## 2015-02-12 NOTE — Telephone Encounter (Signed)
Form,[employer] received,completed by Dr [name] and Katrina faxed 02/12/15.

## 2015-02-12 NOTE — Telephone Encounter (Signed)
Discussed with mom over the phone that pt is able to work in a position that does not need much language skills. Mom said that her original work can not accommodate that. She request to change the back to work date and she needs to find a new job.   Hi, Katrina, could you please put her paperwork onto my desk and I may need to edit the form? Thanks.  Marvel Plan, MD PhD Stroke Neurology 02/12/2015 4:43 PM

## 2015-02-15 NOTE — Telephone Encounter (Signed)
LFt voice message for patients mom that Dr. [name] will change start date for patient.

## 2015-02-16 ENCOUNTER — Telehealth: Payer: Self-pay | Admitting: Neurology

## 2015-02-16 NOTE — Telephone Encounter (Signed)
Rebecca Russo with [facility] called stating she talked with pt's mother and pt has very limited speech and weakness in her rt hand. She discussed with Dr Wylene Simmer and wants to know if Dr [name] wants to know how pt is now or if he wants to know how she is in about a yr from date of stroke to test her. Mother doesn't think pt will make it thru the test. Please call and advise. She can be reached at [phone].

## 2015-02-16 NOTE — Telephone Encounter (Signed)
Mom Rebecca Russo returned your call. Mom asked that you call her back [phone].

## 2015-02-17 NOTE — Telephone Encounter (Signed)
error 

## 2015-02-17 NOTE — Telephone Encounter (Signed)
On the number listed below, however, Rebecca Russo is not available. Left voicemail for her to go back. However, when she calls back, and okay for the patient to have neuropsychology done in the future if mom syncope patient will not make through the test. Thanks.  Marvel Plan, MD PhD Stroke Neurology 02/17/2015 9:08 AM

## 2015-02-17 NOTE — Telephone Encounter (Signed)
LFt voice message for patients mom that date was change to 08/13/2015. Form was fax to [employer].

## 2015-02-17 NOTE — Telephone Encounter (Signed)
Rn call mom to let her know that pt date to be out of work is dated 08-13-2015.

## 2015-02-17 NOTE — Telephone Encounter (Signed)
Rn receive a call from [place] with neuropsychology dept. Dr.Xu left her a message. Talbert Forest from neuropsychology stated that she will schedule pt for January 2017 for an appt, and will call back with the date. Talbert Forest stated she left a phone message for Gavin Pound in medical records to obtain pts ED notes,and office notes.

## 2015-02-17 NOTE — Telephone Encounter (Signed)
Pt's mother called and would like to know what her return to work date listed on the fax. Please call [phone]

## 2015-02-17 NOTE — Telephone Encounter (Signed)
LFt voice message for Talbert Forest at [facility] about patients testing. Rn explain on vm that patient disability form has her out unitl 08-13-2015. Rn explain that a message will be forward to Dr. [name] and he will give her a call. At [phone].

## 2015-02-17 NOTE — Telephone Encounter (Signed)
Called and spoke with [place]. Received fax #: [phone] to fax Dr. [name] recent office notes and notes from Dr. [name] when pt was in the ED. I faxed both and received fax confirmation.

## 2015-02-22 DIAGNOSIS — Z0271 Encounter for disability determination: Secondary | ICD-10-CM

## 2015-04-16 ENCOUNTER — Ambulatory Visit: Payer: Managed Care, Other (non HMO) | Admitting: Cardiovascular Disease

## 2015-06-28 ENCOUNTER — Other Ambulatory Visit: Payer: Self-pay | Admitting: Cardiovascular Disease

## 2015-07-09 ENCOUNTER — Other Ambulatory Visit (HOSPITAL_COMMUNITY): Payer: Self-pay | Admitting: *Deleted

## 2015-07-14 ENCOUNTER — Other Ambulatory Visit: Payer: Self-pay

## 2015-07-14 MED ORDER — CLOPIDOGREL BISULFATE 75 MG PO TABS: 75.0000 mg | ORAL_TABLET | Freq: Every day | ORAL | Status: DC

## 2015-07-14 NOTE — Telephone Encounter (Signed)
error 

## 2015-08-02 ENCOUNTER — Ambulatory Visit (INDEPENDENT_AMBULATORY_CARE_PROVIDER_SITE_OTHER): Payer: Managed Care, Other (non HMO) | Admitting: Neurology

## 2015-08-02 ENCOUNTER — Encounter: Payer: Self-pay | Admitting: Neurology

## 2015-08-02 VITALS — BP 111/71 | HR 69 | Ht 70.0 in | Wt 304.4 lb

## 2015-08-02 DIAGNOSIS — F32A Depression, unspecified: Secondary | ICD-10-CM

## 2015-08-02 DIAGNOSIS — I253 Aneurysm of heart: Secondary | ICD-10-CM

## 2015-08-02 DIAGNOSIS — I63412 Cerebral infarction due to embolism of left middle cerebral artery: Secondary | ICD-10-CM | POA: Diagnosis not present

## 2015-08-02 DIAGNOSIS — Q211 Atrial septal defect: Secondary | ICD-10-CM

## 2015-08-02 DIAGNOSIS — F329 Major depressive disorder, single episode, unspecified: Secondary | ICD-10-CM | POA: Diagnosis not present

## 2015-08-02 DIAGNOSIS — Q2112 Patent foramen ovale: Secondary | ICD-10-CM

## 2015-08-02 MED ORDER — ATORVASTATIN CALCIUM 10 MG PO TABS: 10.0000 mg | ORAL_TABLET | Freq: Every day | ORAL | Status: DC

## 2015-08-02 MED ORDER — CITALOPRAM HYDROBROMIDE 20 MG PO TABS: 20.0000 mg | ORAL_TABLET | Freq: Every day | ORAL | Status: DC

## 2015-08-02 NOTE — Progress Notes (Signed)
STROKE NEUROLOGY FOLLOW UP NOTE  NAME: Rebecca Russo DOB: Jan 25, 1976  REASON FOR VISIT: stroke follow up HISTORY FROM: pt and mom and chart  Today we had the pleasure of seeing Rebecca Russo in follow-up at our Neurology Clinic. Pt was accompanied by mom.   History Summary Rebecca Russo is a 40 y.o. Obese female with no significant past medical history presenting with right hemiparesis, aphasia and left gaze on waking up on 06/04/14. The patient did not receive TPA secondary to unknown time of onset. Perfusion scan revealed left brain large penumbra. 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 She is status post mechanical thrombectomy with TICI3 recannulization. Repeat CTA head and neck showed patent vessels. Etiology for stroke not clear, stroke work up so far negative except large PFO with mobile ASA. Risk factor including progestin implant (Nexplanon) use for contraception , obesity, large PFO with mobile ASA.discharged with ASA and crestor for stroke prevention.   07/07/14 follow up - the patient has been doing well. She is going to get speech and PT outpt rehab. She also followed up with Dr. Excell Seltzer and was put on dural antiplatelet and will do the endovascular PFO closure 07/09/14. After discharge, she went to ER twice for left knee pain with nodule and with chest pain which considered to be due to anxiety and stress. She feels great today. Her speech is much better and only has mild right hand dexterity difficulty. She has Nexplanon implant at left arm, which she got just before the stroke and lasts for one year.   09/14/14 follow up - the patient was doing well. She had PFO closure procedure with Dr. Excell Seltzer on 07/09/14 and went successful. She then had 2D echo repeat which showed stable cardiac function. She is to have 2D echo bubble study in June to evaluate the closure device. She was told to be on dural antiplatelet for 6 months after the procedure.   Pt still has speech  therapy 3 times per week, still has deficit with expression but improved from previous. She still has sensory deficit on her right hand. Easily feel tired but also admit the depressed mood and not able to be back to work. Is dealing with long term disability now. She had Nexplanon implant removed but was put on [name] which is progestin-only OCP for birth control.  02/02/15 follow up - the patient had been doing well, no recurrent stroke symptoms. She saw Dr. Excell Seltzer on 01/15/2015 for follow-up, doing well, recommend mono antiplatelet therapy instead of the dural antiplatelet therapy. She still has mild to moderate expressive aphasia, significant hesitancy on speech. Right hand weakness much improved. However, she had significant depression, decreased appetite, decreased concentration attention, memory loss, and social withdrawal. Still on Celexa 10 g daily. She had one episode of syncope last months, consistent with vasovagal events as per Dr. Excell Seltzer.  Interval History During the interval time, the patient has been doing the same. No recurrent stroke symptoms. However, pt still has depressed mood and has not seen any psychology or psychiatry yet. Last referral was told to be out of network. Still has moderate expressive aphasia and seems not doing any home speech exercise and no improvement of speech during the interval time. BP 111/71. On celexa, but not crestor as the latter was too expensive.   REVIEW OF SYSTEMS: Full 14 system review of systems performed and notable only for those listed below and in HPI above, all others are negative:  Constitutional:  Activity change, unexpected weight change Cardiovascular: leg swelling Ear/Nose/Throat:   Skin:  Eyes:   Respiratory:  SOB Gastroitestinal:  Nausea Genitourinary:  Hematology/Lymphatic:  Bruises easily Endocrine: Excessive thirst Musculoskeletal:  Joint pain Allergy/Immunology:   Neurological:  Memory loss, headache, numbness, speech  difficulty, weakness Psychiatric: Depression Sleep: insomnia  The following represents the patient's updated allergies and side effects list: No Known Allergies  The neurologically relevant items on the patient's problem list were reviewed on today's visit.  Neurologic Examination  A problem focused neurological exam (12 or more points of the single system neurologic examination, vital signs counts as 1 point, cranial nerves count for 8 points) was performed.  Blood pressure 111/71, pulse 69, height 5\' 10"  (1.778 m), weight 304 lb 6.4 oz (138.075 kg).  General - Morbid obesity, well developed, in no apparent distress.  Ophthalmologic - Sharp disc margins OU.  Cardiovascular - Regular rate and rhythm with no murmur.  Mental Status -  Level of arousal and orientation to time, place, and person were intact. Partial expressive aphasia with hesitancy of speech and word finding difficulties. Able to repeat simple sentences, mild deficit on naming, intact on comprehension.   Cranial Nerves II - XII - II - Visual field intact OU. III, IV, VI - Extraocular movements intact. V - Facial sensation intact bilaterally. VII - Facial movement intact bilaterally. VIII - Hearing & vestibular intact bilaterally. X - Palate elevates symmetrically. XI - Chin turning & shoulder shrug intact bilaterally. XII - Tongue protrusion intact.  Motor Strength - The patient's strength was normal in all extremities and pronator drift was absent except subtle right hand dexterity deficit.  Bulk was normal and fasciculations were absent.   Motor Tone - Muscle tone was assessed at the neck and appendages and was normal.  Reflexes - The patient's reflexes were normal in all extremities and she had no pathological reflexes.  Sensory - Light touch, temperature/pinprick, vibration and proprioception, and Romberg testing were assessed and were normal.    Coordination - The patient had normal movements in the hands  and feet with no ataxia or dysmetria.  Tremor was absent.  Gait and Station - The patient's transfers, posture, gait, station, and turns were observed as normal.  Data reviewed: I personally reviewed the images and agree with the radiology interpretations.  Ct Head Wo Contrast 05/30/2014 Subtle CT changes of left MCA territory infarct, most apparent at the insula, with no associated hemorrhage or mass effect. No new intracranial abnormality.  05/29/2014 Subtle decreased sulcation in the left cerebral hemisphere raising the question for diffuse edema. No evidence for acute intracranial hemorrhage.  05/29/2014 Motion degraded exam showing no definite acute stroke or hemorrhage. Hyperdense LEFT ICA suggesting proximal large vessel occlusion   Ct Cerebral Perfusion W/cm 05/29/2014 There could be a moderate-sized ischemic penumbra in the LEFT hemisphere in this patient with suspected LEFT ICA occlusion. At the time of dictation, the patient is in interventional radiology in preparation for possible thrombolysis.   Cerebral Angiogram 05/29/2014 LT ICA and LT T occlusion in isolated Lt cerebral hemisphere. S/P complete TIC 3 revascularization using 2 passes with Trevoprovue 4 mm x 30 mm retrieval device and 8 mg of superselective intracranial intraarterial Integrelin.  Dg Chest Port 1 View 05/31/2014 Extubation, with low lung volumes but no acute disease. 05/30/2014 Low lung volumes with mild right basilar atelectasis.   Dg Abd Portable 1v 05/29/2014 1. OG tube tip in the proximal body of the stomach. 2. Early/incipient small bowel  ileus.   MRI/MRA of the brain Patient refused  CTA head and neck 06/01/14 1. Collected evolution of infarcts involving the left insular cortex and segmental areas of the left anterior MCA distribution. This territory is significantly smaller than the area of ischemia identified on the CT perfusion scan. 2. Persistent patency of the left internal  carotid artery. 3. Slight irregularity of the distal left ICA. This may represent fibromuscular dysplasia. 4. Evidence luxury perfusion within the infarcted territories of the left frontal lobe.  2D Echocardiogram  06/01/14  - Left ventricle: The cavity size was normal. Wall thickness was normal. Systolic function was normal. The estimated ejection fraction was in the range of 55% to 60%. Wall motion was normal; there were no regional wall motion abnormalities. Dopplerparameters are consistent with abnormal left ventricularrelaxation (grade 1 diastolic dysfunction). Impressions: No cardiac source of emboli was indentified.  08/14/14  - Left ventricle: The cavity size was mildly dilated. Wall thickness was normal. Systolic function was normal. The estimated ejection fraction was in the range of 50% to 55%. Wall motion was normal; there were no regional wall motion abnormalities. - Atrial septum: A closure device was present. Impressions: - Limited study to R/O pericardial effusion; low normal LV function; atrial septal closure device noted with no residual shunt; no pericardial effusion.  LE venous doppler - negative for DVT  EKG Sinus rhythm. 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 2) Normal EF 60% 3) No LAA thrombus 4) Trivial MR/AR 5) Normal RV 6) No pericardial effusion 7) No aortic debris    Component     Latest Ref Rng 05/29/2014 05/30/2014 05/30/2014          3:45 AM 12:00 PM  PTT Lupus Anticoagulant     28.0 - 43.0 secs 34.9    PTTLA Confirmation     <8.0 secs NOT APPL    PTTLA 4:1 Mix     28.0 - 43.0 secs NOT APPL    DRVVT     <42.9 secs 35.0    Drvvt confirmation     <1.15 Ratio NOT APPL    dRVVT Incubated 1:1 Mix     <42.9 secs NOT APPL    Lupus Anticoagulant     NOT DETECTED NOT DETECTED    Cholesterol     0 - 200 mg/dL  604 540    Triglycerides     <150 mg/dL  981 (H) 191 (H)  HDL     >39 mg/dL  33 (L) 31 (L)  Total CHOL/HDL Ratio       5.0 5.6  VLDL     0 - 40 mg/dL  43 (H) 45 (H)  LDL (calc)     0 - 99 mg/dL  89 97  Beta-2 Glyco I IgG     <20 G Units 14    Beta-2-Glycoprotein I IgM     <20 M Units 6    Beta-2-Glycoprotein I IgA     <20 A Units 6    Interpretation-F5LEID:      REPORT    Recommendations-F5LEID:      REPORT    Reviewer      REPORT    Interpretation-PTGENE:      REPORT    Recommendations-PTGENE:      REPORT    Reviewer      REPORT    Anticardiolipin IgG     <  23 GPL U/mL 3 (L)    Anticardiolipin IgM     <11 MPL U/mL 4 (L)    Anticardiolipin IgA     <22 APL U/mL 10 (L)    c-ANCA Screen     NEGATIVE     p-ANCA Screen     NEGATIVE     Atypical p-ANCA Screen     NEGATIVE     Hemoglobin A1C     <5.7 %  5.6   Mean Plasma Glucose     <117 mg/dL  161   AntiThromb III Func     75 - 120 % 105    Protein C Activity     74 - 151 % 174 (H)    Protein C, Total     70 - 140 % 110    Protein S Activity     60 - 145 % 113    Protein S Ag, Total     58 - 150 % 128    Homocysteine     0.0 - 15.0 umol/L 8.1    ds DNA Ab          C3 Complement     90 - 180 mg/dL     Complement C4, Body Fluid     10 - 40 mg/dL     SSA (Ro) (ENA) Antibody, IgG     <1.0 NEG AI     SSB (La) (ENA) Antibody, IgG     <1.0 NEG AI     Rhuematoid fact SerPl-aCnc     <=14 IU/mL     Anit Nuclear Antibody(ANA)     NEGATIVE     Alpha galactosidase, serum     0.074 - 0.457 U/L     Sickle Cell Screen     NEGATIVE     D-Dimer, Quant     0.00 - 0.48 ug/mL-FEU      Component     Latest Ref Rng 06/02/2014 06/03/2014 06/27/2014            PTT Lupus Anticoagulant     28.0 - 43.0 secs     PTTLA Confirmation     <8.0 secs     PTTLA 4:1 Mix     28.0 - 43.0 secs     DRVVT     <42.9 secs     Drvvt confirmation     <1.15 Ratio     dRVVT Incubated 1:1 Mix     <42.9 secs     Lupus Anticoagulant     NOT  DETECTED     Cholesterol     0 - 200 mg/dL     Triglycerides     <096 mg/dL     HDL     >04 mg/dL     Total CHOL/HDL Ratio          VLDL     0 - 40 mg/dL     LDL (calc)     0 - 99 mg/dL     Beta-2 Glyco I IgG     <20 G Units     Beta-2-Glycoprotein I IgM     <20 M Units     Beta-2-Glycoprotein I IgA     <20 A Units     Interpretation-F5LEID:          Recommendations-F5LEID:          Reviewer          Interpretation-PTGENE:          Recommendations-PTGENE:  Reviewer          Anticardiolipin IgG     <23 GPL U/mL     Anticardiolipin IgM     <11 MPL U/mL     Anticardiolipin IgA     <22 APL U/mL     c-ANCA Screen     NEGATIVE NEGATIVE    p-ANCA Screen     NEGATIVE NEGATIVE    Atypical p-ANCA Screen     NEGATIVE NEGATIVE    Hemoglobin A1C     <5.7 %     Mean Plasma Glucose     <117 mg/dL     AntiThromb III Func     75 - 120 %     Protein C Activity     74 - 151 %     Protein C, Total     70 - 140 %     Protein S Activity     60 - 145 %     Protein S Ag, Total     58 - 150 %     Homocysteine     0.0 - 15.0 umol/L     ds DNA Ab      1    C3 Complement     90 - 180 mg/dL 811 (H)    Complement C4, Body Fluid     10 - 40 mg/dL 38    SSA (Ro) (ENA) Antibody, IgG     <1.0 NEG AI <1.0 NEG    SSB (La) (ENA) Antibody, IgG     <1.0 NEG AI <1.0 NEG    Rhuematoid fact SerPl-aCnc     <=14 IU/mL <10    Anit Nuclear Antibody(ANA)     NEGATIVE NEGATIVE    Alpha galactosidase, serum     0.074 - 0.457 U/L 0.262    Sickle Cell Screen     NEGATIVE  NEGATIVE   D-Dimer, Quant     0.00 - 0.48 ug/mL-FEU   0.40    Assessment: As you may recall, she is a 40 y.o. African American obese female with no significant past medical history was admitted on 06/04/14 for left MCA stroke. Pt refused MRI but CT repeat confirmed left MCA stroke. The patient did not receive TPA but CT perfusion scan revealed left brain large penumbra and cerebral angio showed left terminal ICA  occlusion. She received mechanical thrombectomy with TICI3 recannulization.Repeat CTA head and neck showed patent vessels. Etiology for stroke not clear, stroke work up including TEE, TTE, CUS, hypercoagulable and autoimmue work up all negative except large PFO with mobile ASA. Risk factor including progestin implant (Nexplanon) use for contraception, obesity, and large PFO with mobile ASA.Due to lack of risk factors and young age, PFO closure was considered and Dr. Excell Seltzer did the procedure 07/09/14. She has removed Nexplanon implant but was put on progestin-only OCP. Completed dural antiplatelet for 6 months post PFO closure procedure. Pt has post stroke depression, on low-dose Celexa. She follow-up with Dr. Excell Seltzer again and recommend change from dural antiplatelet to mono antiplatelet therapy. Her post stroke depression continues, increased Celexa to 20 mg as well as will refer to psychology and psychiatry. Switch from crestor to lipitor due to cost.   Plan:  - continue plavix for stroke prevention. - switch from crestor to lipitor for stroke prevention - continue celexa to  for post stroke depression - continue speech exercise at home - Follow up with your primary care physician for stroke risk factor modification. Recommend maintain blood  pressure goal <130/80, diabetes with hemoglobin A1c goal below 6.5% and lipids with LDL cholesterol goal below 70 mg/dL.  - refer to psychiatry and psychology for post stroke depression. - follow up in 6 months.  I spent more than 25 minutes of face to face time with the patient. Greater than 50% of time was spent in counseling and coordination of care. We discussed about depression treatment, referral, and employment possibilities.    Orders Placed This Encounter  Procedures  . Ambulatory referral to Psychiatry    Referral Priority:  Routine    Referral Type:  Psychiatric    Referral Reason:  Specialty Services Required    Requested Specialty:   Psychiatry    Number of Visits Requested:  1  . Ambulatory referral to Psychology    Referral Priority:  Routine    Referral Type:  Psychiatric    Referral Reason:  Specialty Services Required    Requested Specialty:  Psychology    Number of Visits Requested:  1    Meds ordered this encounter  Medications  . atorvastatin (LIPITOR) 10 MG tablet    Sig: Take 1 tablet (10 mg total) by mouth daily.    Dispense:  90 tablet    Refill:  3  . citalopram (CELEXA) 20 MG tablet    Sig: Take 1 tablet (20 mg total) by mouth daily.    Dispense:  90 tablet    Refill:  3    Patient Instructions  - continue plavix for stroke prevention. - switch from crestor to lipitor for stroke prevention - continue celexa to 20mg  for post stroke depression - continue speech exercise at home - Follow up with your primary care physician for stroke risk factor modification. Recommend maintain blood pressure goal <130/80, diabetes with hemoglobin A1c goal below 6.5% and lipids with LDL cholesterol goal below 70 mg/dL.  - refer to psychiatry and psychology for post stroke depression. - follow up in 6 months.    Marvel Plan, MD PhD [facility] [address] [place], [place] [address] [phone]

## 2015-08-02 NOTE — Patient Instructions (Signed)
-   continue plavix for stroke prevention. - switch from crestor to lipitor for stroke prevention - continue celexa to 20mg  for post stroke depression - continue speech exercise at home - Follow up with your primary care physician for stroke risk factor modification. Recommend maintain blood pressure goal <130/80, diabetes with hemoglobin A1c goal below 6.5% and lipids with LDL cholesterol goal below 70 mg/dL.  - refer to psychiatry and psychology for post stroke depression. - follow up in 6 months.

## 2015-09-21 ENCOUNTER — Telehealth: Payer: Self-pay | Admitting: Neurology

## 2015-09-21 NOTE — Telephone Encounter (Signed)
Rn call patients mom about the [employer] disability form that was sent Sep 13, 2015. Rn stated phone staff put 08/2015. Rn stated the release form has expired and there is a 25.00 charge to have it filled out. Pt and her mom will come on Friday and pay 25.00 charge and filled out a new release form.

## 2015-09-21 NOTE — Telephone Encounter (Addendum)
Pt's mother called said she talked with [place] today, said they had faxed disability forms from pt's job on 4.1.17. She is inquiring if the forms have been received? This call was received by the answering service at 2:22pm

## 2015-09-23 NOTE — Telephone Encounter (Signed)
RN call patients mom Coralee North to state the [employer] disability form was ready. Rn stated pt just needs to pay 25.00 and sign a release form. Pts mom state they will be by tomorrow morning to do both things.

## 2015-09-24 NOTE — Telephone Encounter (Signed)
Form fax to [facility] for patient. Pt paid and did release from.

## 2015-09-27 DIAGNOSIS — Z0289 Encounter for other administrative examinations: Secondary | ICD-10-CM

## 2015-10-01 ENCOUNTER — Telehealth: Payer: Self-pay | Admitting: *Deleted

## 2015-10-01 NOTE — Telephone Encounter (Signed)
Pt disability form faxed on 10/01/15.

## 2015-11-18 ENCOUNTER — Telehealth: Payer: Self-pay | Admitting: Neurology

## 2015-11-18 ENCOUNTER — Telehealth: Payer: Self-pay | Admitting: *Deleted

## 2015-11-18 NOTE — Telephone Encounter (Signed)
Jim/[employer] [phone] called to request call back regarding Medical Records claim# [id number].

## 2015-11-18 NOTE — Telephone Encounter (Signed)
Pt records faxed to [facility] on 11/18/2015 Koleen Distance [phone]

## 2015-11-18 NOTE — Telephone Encounter (Signed)
Notes faxed to [facility] on 11/18/2015

## 2015-11-19 DIAGNOSIS — Z0289 Encounter for other administrative examinations: Secondary | ICD-10-CM

## 2016-02-02 ENCOUNTER — Ambulatory Visit: Payer: Managed Care, Other (non HMO) | Admitting: Neurology

## 2016-02-29 ENCOUNTER — Other Ambulatory Visit: Payer: Self-pay

## 2016-02-29 ENCOUNTER — Ambulatory Visit (HOSPITAL_COMMUNITY): Payer: Managed Care, Other (non HMO) | Attending: Cardiology

## 2016-02-29 DIAGNOSIS — Q2112 Patent foramen ovale: Secondary | ICD-10-CM

## 2016-02-29 DIAGNOSIS — I501 Left ventricular failure: Secondary | ICD-10-CM | POA: Insufficient documentation

## 2016-02-29 DIAGNOSIS — Q211 Atrial septal defect: Secondary | ICD-10-CM | POA: Diagnosis not present

## 2016-03-09 ENCOUNTER — Ambulatory Visit (INDEPENDENT_AMBULATORY_CARE_PROVIDER_SITE_OTHER): Payer: Managed Care, Other (non HMO) | Admitting: Cardiovascular Disease

## 2016-03-09 VITALS — BP 132/80 | HR 70 | Ht 70.0 in | Wt 320.4 lb

## 2016-03-09 DIAGNOSIS — Q211 Atrial septal defect: Secondary | ICD-10-CM

## 2016-03-09 DIAGNOSIS — I5022 Chronic systolic (congestive) heart failure: Secondary | ICD-10-CM | POA: Diagnosis not present

## 2016-03-09 DIAGNOSIS — I428 Other cardiomyopathies: Secondary | ICD-10-CM

## 2016-03-09 DIAGNOSIS — Q2112 Patent foramen ovale: Secondary | ICD-10-CM

## 2016-03-09 MED ORDER — CARVEDILOL 3.125 MG PO TABS: 3.1250 mg | ORAL_TABLET | Freq: Two times a day (BID) | ORAL | 3 refills | Status: DC

## 2016-03-09 MED ORDER — DIAZEPAM 10 MG PO TABS: ORAL_TABLET | ORAL | 0 refills | Status: DC

## 2016-03-09 MED ORDER — LOSARTAN POTASSIUM 25 MG PO TABS: 25.0000 mg | ORAL_TABLET | Freq: Every day | ORAL | 3 refills | Status: DC

## 2016-03-09 MED ORDER — CLOPIDOGREL BISULFATE 75 MG PO TABS: 75.0000 mg | ORAL_TABLET | Freq: Every day | ORAL | 3 refills | Status: DC

## 2016-03-09 NOTE — Patient Instructions (Addendum)
Medication Instructions:  Your physician has recommended you make the following change in your medication:  1. STOP Propranolol 2. START Losartan 25mg  take one tablet by mouth daily 3. START Carvedilol 3.125mg  take one tablet by mouth twice a day 4. Please take Diazepam 10mg  one hour prior to Cardiac MRI.  You will require someone to drive you to and from the appointment for Cardiac MRI.   Labwork: No new orders.   Testing/Procedures: Your physician has requested that you have a cardiac MRI. Cardiac MRI uses a computer to create images of your heart as its beating, producing both still and moving pictures of your heart and major blood vessels. For further information please visit [email or web address]. Please follow the instruction sheet given to you today for more information.  Follow-Up: Your physician recommends that you schedule a follow-up appointment in: 3 MONTHS with Tereso Newcomer PA-C   Any Other Special Instructions Will Be Listed Below (If Applicable).     If you need a refill on your cardiac medications before your next appointment, please call your pharmacy.

## 2016-03-09 NOTE — Progress Notes (Signed)
Cardiology Office Note Date:  03/10/2016   ID:  Pola, Yun Apr 21, 1976, MRN [id number]  PCP:  Arnette Felts  Cardiologist:  Tonny Bollman, MD    Chief Complaint  Patient presents with  . PFO with atrial septal aneurysm     History of Present Illness: Rebecca Russo is a 40 y.o. female who presents for follow-up evaluation. She underwent transcatheter PFO closure 07/09/2014 with a 25 mm Amplatzer Cribriform ASO device. She initially presented in January with a left MCA infarct and she underwent mechanical thrombectomy. During the course of her evaluation, there was no clear etiology of her stroke except for the presence of a large PFO and associated atrial septal aneurysm. Other extensive evaluation showed no evidence of a hypercoagulable disorder or any vascular disease.  The patient is here with her Mother today. She feels well except she complains of exertional dyspnea and orthopnea. Denies edema, heart palpitations, or chest pain. Taking her medications as instructed. No other complaints.   Past Medical History:  Diagnosis Date  . Acute respiratory failure with hypoxia (HCC)   . Aphasia complicating stroke (HCC)   . H/O varicella   . Heart defect   . Heart defect   . Hemiplegia affecting dominant side (HCC)   . Increased BMI 12/17/08  . Ischemic brain damage (HCC)   . Stroke (HCC) 05/29/2014   "weaker in right arm and speech problems since" (07/09/2014)    Past Surgical History:  Procedure Laterality Date  . LAPAROSCOPIC CHOLECYSTECTOMY  06/20/2012  . PATENT FORAMEN OVALE CLOSURE  07/09/2014  . PATENT FORAMEN OVALE CLOSURE N/A 07/09/2014   Procedure: PATENT FORAMEN OVALE CLOSURE;  Surgeon: Micheline Chapman, MD;  Location: [facility] CATH LAB;  Service: Cardiovascular;  Laterality: N/A;  . RADIOLOGY WITH ANESTHESIA N/A 05/29/2014   Procedure: RADIOLOGY WITH ANESTHESIA;  Surgeon: Oneal Grout, MD;  Location: [facility] OR;  Service: Radiology;  Laterality: N/A;  . TEE WITHOUT  CARDIOVERSION N/A 06/03/2014   Procedure: TRANSESOPHAGEAL ECHOCARDIOGRAM (TEE);  Surgeon: Wendall Stade, MD;  Location: [facility] ENDOSCOPY;  Service: Cardiovascular;  Laterality: N/A;    Current Outpatient Prescriptions  Medication Sig Dispense Refill  . atorvastatin (LIPITOR) 10 MG tablet Take 1 tablet (10 mg total) by mouth daily. 90 tablet 3  . citalopram (CELEXA) 20 MG tablet Take 1 tablet (20 mg total) by mouth daily. 90 tablet 3  . clopidogrel (PLAVIX) 75 MG tablet Take 1 tablet (75 mg total) by mouth daily. 90 tablet 3  . Multiple Vitamins-Minerals (MULTIVITAMIN WITH MINERALS) tablet Take 1 tablet by mouth daily.    . carvedilol (COREG) 3.125 MG tablet Take 1 tablet (3.125 mg total) by mouth 2 (two) times daily. 180 tablet 3  . diazepam (VALIUM) 10 MG tablet Take one tablet by mouth one hour prior to Cardiac MRI 1 tablet 0  . losartan (COZAAR) 25 MG tablet Take 1 tablet (25 mg total) by mouth daily. 90 tablet 3   No current facility-administered medications for this visit.     Allergies:   Review of patient's allergies indicates no known allergies.   Social History:  The patient  reports that she has never smoked. She has never used smokeless tobacco. She reports that she does not drink alcohol or use drugs.   Family History:  The patient's  family history includes Hypertension in her father and mother; Stroke in her father, maternal grandfather, and paternal grandfather.    ROS:  Please see the history of present illness.  All other systems are reviewed and negative.    PHYSICAL EXAM: VS:  BP 132/80   Pulse 70   Ht 5\' 10"  (1.778 m)   Wt (!) 320 lb 6.4 oz (145.3 kg)   BMI 45.97 kg/m  , BMI Body mass index is 45.97 kg/m. GEN: Well nourished, well developed, pleasant obese woman in no acute distress  HEENT: normal  Neck: no JVD, no masses. No carotid bruits Cardiac: RRR without murmur or gallop                Respiratory:  clear to auscultation bilaterally, normal work of  breathing GI: soft, nontender, nondistended, + BS MS: no deformity or atrophy  Ext: no pretibial edema, pedal pulses 2+= bilaterally Skin: warm and dry, no rash Neuro:  Strength and sensation are intact Psych: euthymic mood, full affect  EKG:  EKG is ordered today. The ekg ordered today shows sinus rhythm with sinus arrhythmia, nonspecific T wave abnormality, no significant changes  Recent Labs: No results found for requested labs within last 8760 hours.   Lipid Panel     Component Value Date/Time   CHOL 173 05/30/2014 1200   TRIG 225 (H) 05/30/2014 1200   HDL 31 (L) 05/30/2014 1200   CHOLHDL 5.6 05/30/2014 1200   VLDL 45 (H) 05/30/2014 1200   LDLCALC 97 05/30/2014 1200      Wt Readings from Last 3 Encounters:  03/09/16 (!) 320 lb 6.4 oz (145.3 kg)  08/02/15 (!) 304 lb 6.4 oz (138.1 kg)  02/02/15 285 lb 3.2 oz (129.4 kg)     Cardiac Studies Reviewed: 2D Echo 02-29-2016: Left ventricle:  The cavity size was mildly dilated. Wall thickness was normal. Systolic function was mildly to moderately reduced. The estimated ejection fraction was in the range of 40% to 45%. Diffuse hypokinesis. The transmitral flow pattern was normal. The deceleration time of the early transmitral flow velocity was normal. The pulmonary vein flow pattern was normal. The tissue Doppler parameters were normal. Left ventricular diastolic function parameters were normal.  ------------------------------------------------------------------- Aortic valve:   Trileaflet; normal thickness leaflets. Mobility was not restricted.  Doppler:  Transvalvular velocity was within the normal range. There was no stenosis. There was no regurgitation.   ------------------------------------------------------------------- Aorta:  Aortic root: The aortic root was normal in size.  ------------------------------------------------------------------- Mitral valve:   Structurally normal valve.   Mobility was  not restricted.  Doppler:  Transvalvular velocity was within the normal range. There was no evidence for stenosis. There was no regurgitation.    Peak gradient (D): 3 mm Hg.  ------------------------------------------------------------------- Left atrium:  The atrium was mildly dilated.  ------------------------------------------------------------------- Atrial septum:  A closure device was present.  ------------------------------------------------------------------- Right ventricle:  The cavity size was normal. Systolic function was normal.  ------------------------------------------------------------------- Pulmonic valve:    Doppler:  Transvalvular velocity was within the normal range. There was no evidence for stenosis.  ------------------------------------------------------------------- Tricuspid valve:   Structurally normal valve.    Doppler: Transvalvular velocity was within the normal range. There was no regurgitation.  ------------------------------------------------------------------- Right atrium:  The atrium was normal in size.  ------------------------------------------------------------------- Pericardium:  There was no pericardial effusion.  ------------------------------------------------------------------- Systemic veins: Inferior vena cava: The vessel was normal in size.  ASSESSMENT AND PLAN: 1.  PFO s/p transcatheter closure: echo reviewed and shows appropriate device position. She will continue on clopidogrel for antiplatelet therapy.  2. Cardiomyopathy, suspect nonischemic: echo images reviewed and I agree she has mild global LV dysfunction, NYHA II sx's. Recommend  addition of carvedilol 3.125 mg BID and losartan 25 mg daily. Arrange FU 3 months with Scott for further medication titration. Order cardiac MRI with gadolinium contrast/delayed imaging to evaluate etiology of cardiomyopathy. Amplatzer device is safe for MRI as long as 3T or less  scanner.  Current medicines are reviewed with the patient today.  The patient does not have concerns regarding medicines.  Labs/ tests ordered today include:   Orders Placed This Encounter  Procedures  . MR Card Morphology Wo/W Cm  . EKG 12-Lead    Disposition:   FU 3 months with Tereso Newcomer, PA-C.   Enzo Bi, MD  03/10/2016 5:33 AM    [facility] [address] [place], [place], [place]  [address] Phone: [phone]; Fax: [phone]

## 2016-03-10 ENCOUNTER — Encounter: Payer: Self-pay | Admitting: Cardiovascular Disease

## 2016-03-16 ENCOUNTER — Encounter: Payer: Self-pay | Admitting: Cardiovascular Disease

## 2016-03-16 ENCOUNTER — Telehealth: Payer: Self-pay | Admitting: Cardiovascular Disease

## 2016-03-16 NOTE — Telephone Encounter (Signed)
Called patient and left a voicemail regarding her cardiac MRI scheduled for 03-27-16 arriving at 7:45 at [facility].  Letter mailed today.

## 2016-03-22 ENCOUNTER — Telehealth: Payer: Self-pay | Admitting: Cardiovascular Disease

## 2016-03-22 NOTE — Telephone Encounter (Signed)
Patient called and wants to reschedule MRI.  I called her back and left my name and number to reschedule MRI.

## 2016-03-27 ENCOUNTER — Ambulatory Visit (HOSPITAL_COMMUNITY): Admission: RE | Admit: 2016-03-27 | Payer: Managed Care, Other (non HMO) | Source: Ambulatory Visit

## 2016-04-02 ENCOUNTER — Other Ambulatory Visit: Payer: Self-pay | Admitting: Neurology

## 2016-04-02 DIAGNOSIS — I639 Cerebral infarction, unspecified: Secondary | ICD-10-CM

## 2016-04-05 ENCOUNTER — Encounter: Payer: Self-pay | Admitting: Cardiovascular Disease

## 2016-04-14 ENCOUNTER — Encounter: Payer: Self-pay | Admitting: Neurology

## 2016-04-14 ENCOUNTER — Ambulatory Visit (INDEPENDENT_AMBULATORY_CARE_PROVIDER_SITE_OTHER): Payer: Managed Care, Other (non HMO) | Admitting: Neurology

## 2016-04-14 VITALS — BP 113/66 | HR 76 | Wt 322.6 lb

## 2016-04-14 DIAGNOSIS — F32A Depression, unspecified: Secondary | ICD-10-CM

## 2016-04-14 DIAGNOSIS — F329 Major depressive disorder, single episode, unspecified: Secondary | ICD-10-CM

## 2016-04-14 DIAGNOSIS — I63412 Cerebral infarction due to embolism of left middle cerebral artery: Secondary | ICD-10-CM

## 2016-04-14 DIAGNOSIS — I253 Aneurysm of heart: Secondary | ICD-10-CM

## 2016-04-14 DIAGNOSIS — E785 Hyperlipidemia, unspecified: Secondary | ICD-10-CM

## 2016-04-14 DIAGNOSIS — Q211 Atrial septal defect: Secondary | ICD-10-CM | POA: Diagnosis not present

## 2016-04-14 MED ORDER — CITALOPRAM HYDROBROMIDE 20 MG PO TABS: 20.0000 mg | ORAL_TABLET | Freq: Every day | ORAL | 3 refills | Status: DC

## 2016-04-14 MED ORDER — ROSUVASTATIN CALCIUM 5 MG PO TABS: 5.0000 mg | ORAL_TABLET | Freq: Every day | ORAL | 5 refills | Status: DC

## 2016-04-14 NOTE — Patient Instructions (Signed)
-   continue plavix for stroke prevention. 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 Deal with residue deficit. - Follow up with your primary care physician for stroke risk factor modification. Recommend maintain blood pressure goal <130/80, diabetes with hemoglobin A1c goal below 6.5% and lipids with LDL cholesterol goal below 70 mg/dL.  - follow up with Dr. Excell Seltzer as scheduled - follow up in 6 months.

## 2016-04-14 NOTE — Progress Notes (Signed)
STROKE NEUROLOGY FOLLOW UP NOTE  NAME: Rebecca Russo DOB: 1976-05-03  REASON FOR VISIT: stroke follow up HISTORY FROM: pt and mom and chart  Today we had the pleasure of seeing [name] in follow-up at our [facility]rology [facility]. Pt was accompanied by mom.   History Summary [name] is a 40 y.o. Obese female with no significant past medical history presenting with right hemiparesis, aphasia and left gaze on waking up on 06/04/14. The patient did not receive TPA secondary to unknown time of onset. Perfusion scan revealed left brain large penumbra. 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 She is status post mechanical thrombectomy with TICI3 recannulization. Repeat CTA head and neck showed patent vessels. Etiology for stroke not clear, stroke work up so far negative except large PFO with mobile ASA. Risk factor including progestin implant (Nexplanon) use for contraception , obesity, large PFO with mobile ASA.discharged with ASA and crestor for stroke prevention.   07/07/14 follow up - the patient has been doing well. She is going to get speech and PT outpt rehab. She also followed up with Dr. Excell Seltzer and was put on dural antiplatelet and will do the endovascular PFO closure 07/09/14. After discharge, she went to ER twice for left knee pain with nodule and with chest pain which considered to be due to anxiety and stress. She feels great today. Her speech is much better and only has mild right hand dexterity difficulty. She has Nexplanon implant at left arm, which she got just before the stroke and lasts for one year.   09/14/14 follow up - the patient was doing well. She had PFO closure procedure with Dr. Excell Seltzer on 07/09/14 and went successful. She then had 2D echo repeat which showed stable cardiac function. She is to have 2D echo bubble study in June to evaluate the closure device. She was told to be on dural antiplatelet for 6 months after the procedure.   Pt still has speech  therapy 3 times per week, still has deficit with expression but improved from previous. She still has sensory deficit on her right hand. Easily feel tired but also admit the depressed mood and not able to be back to work. Is dealing with long term disability now. She had Nexplanon implant removed but was put on [name] which is progestin-only OCP for birth control.  02/02/15 follow up - the patient had been doing well, no recurrent stroke symptoms. She saw Dr. Excell Seltzer on 01/15/2015 for follow-up, doing well, recommend mono antiplatelet therapy instead of the dural antiplatelet therapy. She still has mild to moderate expressive aphasia, significant hesitancy on speech. Right hand weakness much improved. However, she had significant depression, decreased appetite, decreased concentration attention, memory loss, and social withdrawal. Still on Celexa 10 g daily. She had one episode of syncope last months, consistent with vasovagal events as per Dr. Excell Seltzer.  08/02/15 follow up - the patient has been doing the same. No recurrent stroke symptoms. However, pt still has depressed mood and has not seen any psychology or psychiatry yet. Last referral was told to be out of network. Still has moderate expressive aphasia and seems not doing any home speech exercise and no improvement of speech during the interval time. BP 111/71. On celexa, but not crestor as the latter was too expensive.  Interval History During the interval time, pt has been doing the same. Still complains of speech difficulty and right hand numbness and decreased dexterity. She speaks better with slowing down.  Trying to look for new jobs but not successful. Had recent follow up with Dr. Excell Seltzer and found to have decreased EF from 50-55% down to 40-45%. She was put on low dose coreg and losartan. Insurance did not cover psychology and psychiatry as per pt mom. BP stable at 113/66.   REVIEW OF SYSTEMS: Full 14 system review of systems performed and notable  only for those listed below and in HPI above, all others are negative:  Constitutional:   Cardiovascular:  Ear/Nose/Throat:   Skin:  Eyes:   Respiratory:   Gastroitestinal:   Genitourinary:  Hematology/Lymphatic:   Endocrine: Excessive thirst Musculoskeletal:   Allergy/Immunology:   Neurological:   Psychiatric:  Sleep:   The following represents the patient's updated allergies and side effects list: No Known Allergies  The neurologically relevant items on the patient's problem list were reviewed on today's visit.  Neurologic Examination  A problem focused neurological exam (12 or more points of the single system neurologic examination, vital signs counts as 1 point, cranial nerves count for 8 points) was performed.  Blood pressure 113/66, pulse 76, weight (!) 322 lb 9.6 oz (146.3 kg).  General - Morbid obesity, well developed, in no apparent distress.  Ophthalmologic - Sharp disc margins OU.  Cardiovascular - Regular rate and rhythm with no murmur.  Mental Status -  Level of arousal and orientation to time, place, and person were intact. Partial expressive aphasia with hesitancy of speech and word finding difficulties. Able to repeat simple sentences, mild deficit on naming, intact on comprehension.   Cranial Nerves II - XII - II - Visual field intact OU. III, IV, VI - Extraocular movements intact. V - Facial sensation intact bilaterally. VII - Facial movement intact bilaterally. VIII - Hearing & vestibular intact bilaterally. X - Palate elevates symmetrically. XI - Chin turning & shoulder shrug intact bilaterally. XII - Tongue protrusion intact.  Motor Strength - The patient's strength was normal in all extremities and pronator drift was absent except subtle right hand dexterity deficit.  Bulk was normal and fasciculations were absent.   Motor Tone - Muscle tone was assessed at the neck and appendages and was normal.  Reflexes - The patient's reflexes were normal  in all extremities and she had no pathological reflexes.  Sensory - Light touch, temperature/pinprick, vibration and proprioception, and Romberg testing were assessed and were normal.    Coordination - The patient had normal movements in the hands and feet with no ataxia or dysmetria.  Tremor was absent.  Gait and Station - The patient's transfers, posture, gait, station, and turns were observed as normal.  Data reviewed: I personally reviewed the images and agree with the radiology interpretations.  Ct Head Wo Contrast 05/30/2014 Subtle CT changes of left MCA territory infarct, most apparent at the insula, with no associated hemorrhage or mass effect. No new intracranial abnormality.  05/29/2014 Subtle decreased sulcation in the left cerebral hemisphere raising the question for diffuse edema. No evidence for acute intracranial hemorrhage.  05/29/2014 Motion degraded exam showing no definite acute stroke or hemorrhage. Hyperdense LEFT ICA suggesting proximal large vessel occlusion   Ct Cerebral Perfusion W/cm 05/29/2014 There could be a moderate-sized ischemic penumbra in the LEFT hemisphere in this patient with suspected LEFT ICA occlusion. At the time of dictation, the patient is in interventional radiology in preparation for possible thrombolysis.   Cerebral Angiogram 05/29/2014 LT ICA and LT T occlusion in isolated Lt cerebral hemisphere. S/P complete TIC 3 revascularization using 2 passes  with Trevoprovue 4 mm x 30 mm retrieval device and 8 mg of superselective intracranial intraarterial Integrelin.  Dg Chest Port 1 View 05/31/2014 Extubation, with low lung volumes but no acute disease. 05/30/2014 Low lung volumes with mild right basilar atelectasis.   Dg Abd Portable 1v 05/29/2014 1. OG tube tip in the proximal body of the stomach. 2. Early/incipient small bowel ileus.   MRI/MRA of the brain Patient refused  CTA head and neck 06/01/14 1. Collected evolution of  infarcts involving the left insular cortex and segmental areas of the left anterior MCA distribution. This territory is significantly smaller than the area of ischemia identified on the CT perfusion scan. 2. Persistent patency of the left internal carotid artery. 3. Slight irregularity of the distal left ICA. This may represent fibromuscular dysplasia. 4. Evidence luxury perfusion within the infarcted territories of the left frontal lobe.  2D Echocardiogram  06/01/14  - Left ventricle: The cavity size was normal. Wall thickness was normal. Systolic function was normal. The estimated ejection fraction was in the range of 55% to 60%. Wall motion was normal; there were no regional wall motion abnormalities. Dopplerparameters are consistent with abnormal left ventricularrelaxation (grade 1 diastolic dysfunction). Impressions: No cardiac source of emboli was indentified.  08/14/14  - Left ventricle: The cavity size was mildly dilated. Wall thickness was normal. Systolic function was normal. The estimated ejection fraction was in the range of 50% to 55%. Wall motion was normal; there were no regional wall motion abnormalities. - Atrial septum: A closure device was present. Impressions: - Limited study to R/O pericardial effusion; low normal LV function; atrial septal closure device noted with no residual shunt; no pericardial effusion.  LE venous doppler - negative for DVT  EKG Sinus rhythm. 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 2) Normal EF 60% 3) No LAA thrombus 4) Trivial MR/AR 5) Normal RV 6) No pericardial effusion 7) No aortic debris  TTE 02/29/16 - Left ventricle: The cavity size was mildly dilated. Wall   thickness was normal. Systolic function was mildly to moderately   reduced. The estimated ejection fraction was in the range of 40%   to 45%. Diffuse  hypokinesis. Left ventricular diastolic function   parameters were normal. - Left atrium: The atrium was mildly dilated. - Atrial septum: A closure device was present. Impressions: - Mild to moderate gobal reduction in LV function; mild LAE; PFO   closure device noted with no residual shunt.   Component     Latest Ref Rng 05/29/2014 05/30/2014 05/30/2014          3:45 AM 12:00 PM  PTT Lupus Anticoagulant     28.0 - 43.0 secs 34.9    PTTLA Confirmation     <8.0 secs NOT APPL    PTTLA 4:1 Mix     28.0 - 43.0 secs NOT APPL    DRVVT     <42.9 secs 35.0    Drvvt confirmation     <1.15 Ratio NOT APPL    dRVVT Incubated 1:1 Mix     <42.9 secs NOT APPL    Lupus Anticoagulant     NOT DETECTED NOT DETECTED    Cholesterol     0 - 200 mg/dL  161 096  Triglycerides     <150 mg/dL  045 (H) 409 (H)  HDL     >39 mg/dL  33 (L)  31 (L)  Total CHOL/HDL Ratio       5.0 5.6  VLDL     0 - 40 mg/dL  43 (H) 45 (H)  LDL (calc)     0 - 99 mg/dL  89 97  Beta-2 Glyco I IgG     <20 G Units 14    Beta-2-Glycoprotein I IgM     <20 M Units 6    Beta-2-Glycoprotein I IgA     <20 A Units 6    Interpretation-F5LEID:      REPORT    Recommendations-F5LEID:      REPORT    Reviewer      REPORT    Interpretation-PTGENE:      REPORT    Recommendations-PTGENE:      REPORT    Reviewer      REPORT    Anticardiolipin IgG     <23 GPL U/mL 3 (L)    Anticardiolipin IgM     <11 MPL U/mL 4 (L)    Anticardiolipin IgA     <22 APL U/mL 10 (L)    c-ANCA Screen     NEGATIVE     p-ANCA Screen     NEGATIVE     Atypical p-ANCA Screen     NEGATIVE     Hemoglobin A1C     <5.7 %  5.6   Mean Plasma Glucose     <117 mg/dL  161   AntiThromb III Func     75 - 120 % 105    Protein C Activity     74 - 151 % 174 (H)    Protein C, Total     70 - 140 % 110    Protein S Activity     60 - 145 % 113    Protein S Ag, Total     58 - 150 % 128    Homocysteine     0.0 - 15.0 umol/L 8.1    ds DNA Ab          C3  Complement     90 - 180 mg/dL     Complement C4, Body Fluid     10 - 40 mg/dL     SSA (Ro) (ENA) Antibody, IgG     <1.0 NEG AI     SSB (La) (ENA) Antibody, IgG     <1.0 NEG AI     Rhuematoid fact SerPl-aCnc     <=14 IU/mL     Anit Nuclear Antibody(ANA)     NEGATIVE     Alpha galactosidase, serum     0.074 - 0.457 U/L     Sickle Cell Screen     NEGATIVE     D-Dimer, Quant     0.00 - 0.48 ug/mL-FEU      Component     Latest Ref Rng 06/02/2014 06/03/2014 06/27/2014            PTT Lupus Anticoagulant     28.0 - 43.0 secs     PTTLA Confirmation     <8.0 secs     PTTLA 4:1 Mix     28.0 - 43.0 secs     DRVVT     <42.9 secs     Drvvt confirmation     <1.15 Ratio     dRVVT Incubated 1:1 Mix     <42.9 secs     Lupus Anticoagulant     NOT DETECTED     Cholesterol     0 -  200 mg/dL     Triglycerides     <161<150 mg/dL     HDL     >09>39 mg/dL     Total CHOL/HDL Ratio          VLDL     0 - 40 mg/dL     LDL (calc)     0 - 99 mg/dL     Beta-2 Glyco I IgG     <20 G Units     Beta-2-Glycoprotein I IgM     <20 M Units     Beta-2-Glycoprotein I IgA     <20 A Units     Interpretation-F5LEID:          Recommendations-F5LEID:          Reviewer          Interpretation-PTGENE:          Recommendations-PTGENE:          Reviewer          Anticardiolipin IgG     <23 GPL U/mL     Anticardiolipin IgM     <11 MPL U/mL     Anticardiolipin IgA     <22 APL U/mL     c-ANCA Screen     NEGATIVE NEGATIVE    p-ANCA Screen     NEGATIVE NEGATIVE    Atypical p-ANCA Screen     NEGATIVE NEGATIVE    Hemoglobin A1C     <5.7 %     Mean Plasma Glucose     <117 mg/dL     AntiThromb III Func     75 - 120 %     Protein C Activity     74 - 151 %     Protein C, Total     70 - 140 %     Protein S Activity     60 - 145 %     Protein S Ag, Total     58 - 150 %     Homocysteine     0.0 - 15.0 umol/L     ds DNA Ab      1    C3 Complement     90 - 180 mg/dL 604210 (H)    Complement C4,  Body Fluid     10 - 40 mg/dL 38    SSA (Ro) (ENA) Antibody, IgG     <1.0 NEG AI <1.0 NEG    SSB (La) (ENA) Antibody, IgG     <1.0 NEG AI <1.0 NEG    Rhuematoid fact SerPl-aCnc     <=14 IU/mL <10    Anit Nuclear Antibody(ANA)     NEGATIVE NEGATIVE    Alpha galactosidase, serum     0.074 - 0.457 U/L 0.262    Sickle Cell Screen     NEGATIVE  NEGATIVE   D-Dimer, Quant     0.00 - 0.48 ug/mL-FEU   0.40    Assessment: As you may recall, she is a 40 y.o. African American obese female with no significant past medical history was admitted on 06/04/14 for left MCA stroke. Pt refused MRI but CT repeat confirmed left MCA stroke. 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 She received mechanical thrombectomy with TICI3 recannulization.Repeat CTA head and neck showed patent vessels. Etiology for stroke not clear, stroke work up including TEE, TTE, CUS, hypercoagulable and autoimmue work up all negative except large PFO  with mobile ASA. Risk factor including progestin implant (Nexplanon) use for contraception, obesity, and large PFO with mobile ASA.Due to lack of risk factors and young age, PFO closure was considered and Dr. Excell Seltzer did the procedure 07/09/14. She has removed Nexplanon implant but was put on progestin-only OCP. Completed dural antiplatelet for 6 months post PFO closure procedure. Pt has post stroke depression, on low dose Celexa. She follow-up with Dr. Excell Seltzer again and recommend change from dural antiplatelet to mono antiplatelet therapy. Her post stroke depression continues, increased Celexa to 20 mg, however, insurance did not cover psychology and psychiatry referral as per mom. Switch from crestor to lipitor due to cost, but now willing to switch back given cardiomyopathy.  Plan:  - continue plavix for stroke prevention. - switch from lipitor to crestor for stroke prevention - continue  celexa to 20mg  for post stroke depression - continue speech and right hand exercise at home. Deal with residue deficit. - Follow up with your primary care physician for stroke risk factor modification. Recommend maintain blood pressure goal <130/80, diabetes with hemoglobin A1c goal below 6.5% and lipids with LDL cholesterol goal below 70 mg/dL.  - follow up with Dr. Excell Seltzer as scheduled - follow up in 6 months.  I spent more than 25 minutes of face to face time with the patient. Greater than 50% of time was spent in counseling and coordination of care. We discussed about depression treatment, follow up with cardiology, and statin options.    No orders of the defined types were placed in this encounter.   Meds ordered this encounter  Medications  . citalopram (CELEXA) 20 MG tablet    Sig: Take 1 tablet (20 mg total) by mouth daily.    Dispense:  90 tablet    Refill:  3  . rosuvastatin (CRESTOR) 5 MG tablet    Sig: Take 1 tablet (5 mg total) by mouth daily.    Dispense:  30 tablet    Refill:  5    Patient Instructions  - continue plavix for stroke prevention. 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 Deal with residue deficit. - Follow up with your primary care physician for stroke risk factor modification. Recommend maintain blood pressure goal <130/80, diabetes with hemoglobin A1c goal below 6.5% and lipids with LDL cholesterol goal below 70 mg/dL.  - follow up with Dr. Excell Seltzer as scheduled - follow up in 6 months.   Marvel Plan, MD PhD [facility] [address], [address] [place], [place] [address] [phone]

## 2016-04-19 ENCOUNTER — Encounter (HOSPITAL_COMMUNITY): Payer: Self-pay

## 2016-04-19 ENCOUNTER — Ambulatory Visit (HOSPITAL_COMMUNITY)
Admission: RE | Admit: 2016-04-19 | Discharge: 2016-04-19 | Disposition: A | Discharge status: Home / Self Care | Payer: Managed Care, Other (non HMO) | Source: Ambulatory Visit | Attending: Cardiovascular Disease | Admitting: Cardiovascular Disease

## 2016-04-19 ENCOUNTER — Telehealth: Payer: Self-pay | Admitting: Cardiovascular Disease

## 2016-04-19 DIAGNOSIS — Q2112 Patent foramen ovale: Secondary | ICD-10-CM

## 2016-04-19 DIAGNOSIS — Q211 Atrial septal defect: Secondary | ICD-10-CM

## 2016-04-19 DIAGNOSIS — I428 Other cardiomyopathies: Secondary | ICD-10-CM

## 2016-04-19 NOTE — Telephone Encounter (Signed)
Crystal with MRI @ [facility] calling to say patient had too much anxiety and couldn't have MRI done, also Crystal said she's not sure she could have fit the patient in the machine anyway, she weighs over 300 lbs-wants [place] to contact pt with next step-

## 2016-04-24 NOTE — Progress Notes (Signed)
Pt was unable to perform cardiac MRI due to being claustrophobic (Dr Excell Seltzer did provide the pt with valium to take prior to test).  Discussed with Dr Excell Seltzer and he recommends that the pt have a repeat echocardiogram performed in 6 MONTHS for follow-up.

## 2016-05-01 ENCOUNTER — Telehealth: Payer: Self-pay

## 2016-05-01 NOTE — Telephone Encounter (Signed)
Rn call patients mom about the disability form for [employer]. Rn stated the limitations form was already filled out by someone. Pts mom stated she filled the form out by mistake. Pts mom Rebecca Russo stated Dr. [name] can cross the part she filled out and sign it. Rn stated Dr. [name] will be unable to sign the limitations sheet because she filled it out. Rn stated another form will have to be fax to [employer]. Rn ask pts mom of the due date. Pts mom stated its due today. Rn stated the form was brought to the office on 04/28/2016 by her. Rn stated Dr. [name] will not be in the office till January 2017. Rn stated [employer] has ten days to filled the form out for the pt. PTs mom will be try to get the other paper fax to [employer].

## 2016-05-25 NOTE — Telephone Encounter (Signed)
Rn call patients mom Rebecca Russo about the form for her daughter. Rn explain Dr.Cooper filled out the same form in 04/2016. Rn stated is the form needed. Pts mom stated they still need Dr. [name] form for her stroke history Rn stated the price increase to 50.00 in June 2017. Pts mom stated she paid 25.00 dollars in June 2017.Rn stated the last time she paid for the form was 09/2015. Rn stated the fee increase in June 2017. Pts mom than stated "I did see the sign for the fee increase. Pts mom will come by Friday or Monday to pay the fee.

## 2016-05-30 NOTE — Telephone Encounter (Signed)
Form sent to Medical records. Pts mom was suppose to come in this week and pay the 50.00 dollars. Pt last paid in May 2017.

## 2016-06-08 ENCOUNTER — Encounter: Payer: Self-pay | Admitting: Neurology

## 2016-06-09 ENCOUNTER — Encounter: Payer: Self-pay | Admitting: Physician Assistant

## 2016-06-09 ENCOUNTER — Other Ambulatory Visit: Payer: Self-pay | Admitting: Neurology

## 2016-06-09 ENCOUNTER — Ambulatory Visit (INDEPENDENT_AMBULATORY_CARE_PROVIDER_SITE_OTHER): Payer: Managed Care, Other (non HMO) | Admitting: Physician Assistant

## 2016-06-09 VITALS — BP 102/60 | HR 81 | Ht 70.0 in | Wt 328.8 lb

## 2016-06-09 DIAGNOSIS — Q211 Atrial septal defect: Secondary | ICD-10-CM | POA: Diagnosis not present

## 2016-06-09 DIAGNOSIS — Q2112 Patent foramen ovale: Secondary | ICD-10-CM

## 2016-06-09 DIAGNOSIS — I5022 Chronic systolic (congestive) heart failure: Secondary | ICD-10-CM

## 2016-06-09 DIAGNOSIS — Z8673 Personal history of transient ischemic attack (TIA), and cerebral infarction without residual deficits: Secondary | ICD-10-CM | POA: Diagnosis not present

## 2016-06-09 DIAGNOSIS — I639 Cerebral infarction, unspecified: Secondary | ICD-10-CM

## 2016-06-09 DIAGNOSIS — E785 Hyperlipidemia, unspecified: Secondary | ICD-10-CM | POA: Diagnosis not present

## 2016-06-09 HISTORY — DX: Chronic systolic (congestive) heart failure: I50.22

## 2016-06-09 MED ORDER — ROSUVASTATIN CALCIUM 5 MG PO TABS: 5.0000 mg | ORAL_TABLET | Freq: Every day | ORAL | 3 refills | Status: DC

## 2016-06-09 NOTE — Progress Notes (Signed)
Cardiology Office Note:    Date:  06/09/2016   ID:  Rebecca Russo, DOB August 20, 1975, MRN [id number]  PCP:  No primary care provider on file.  Cardiologist:  Dr. Tonny Bollman   Electrophysiologist:  n/a  Referring MD: Arnette Felts, FNP   Chief Complaint  Patient presents with  . Follow-up    CHF    History of Present Illness:    Rebecca Russo is a 41 y.o. female with a hx of PFO s/p transcatheter closure in 2/16 with 25 mm Amplatzer Cribiform ASO device.  She originally presented in 1/16 with L MCA infarct.  There was no clear etiology for her CVA except for the presence of a large PFO and associated atrial septal aneurysm.  Last seen by Dr. Tonny Bollman in 10/17.  Echo at that time demonstrated reduced LV function with EF 40-45%. Carvedilol and losartan were added to her medical regimen. Cardiac MRI was recommended. However, patient could not tolerate the procedure. Therefore, repeat echo was recommended after 6 mos of Rx.    She returns for Cardiology follow up.  She is here alone.  She denies chest pain.  She does complain of dyspnea on exertion at times with mod to extreme activities. She has slept on an incline since her CVA.  She has mild ankle edema.  She denies syncope. But, she has felt lightheaded since starting Carvedilol and Losartan.     Prior CV studies that were reviewed today include:    Echo 02/29/16 EF 40-45, diffuse HK, mild LAE, PFO closure device without residual shunt  Past Medical History:  Diagnosis Date  . Aphasia complicating stroke (HCC)   . Chronic systolic heart failure (HCC) 06/09/2016   a - Prob nonischemic //  Echo 10/17: EF 40-45, diffuse HK, mild LAE, PFO closure device without residual shunt  //  Could not tol cMRI   . H/O varicella   . Hemiplegia affecting dominant side (HCC)   . History of ischemic left MCA stroke   . Increased BMI 12/17/08  . PFO with atrial septal aneurysm 06/04/2014   a - s/p transcatheter closure in 2/16 with 25 mm  Amplatzer Cribiform ASO device // b - Echo 10/17:  EF 40-45, diffuse HK, mild LAE, PFO closure device without residual shunt     Past Surgical History:  Procedure Laterality Date  . LAPAROSCOPIC CHOLECYSTECTOMY  06/20/2012  . PATENT FORAMEN OVALE CLOSURE  07/09/2014  . PATENT FORAMEN OVALE CLOSURE N/A 07/09/2014   Procedure: PATENT FORAMEN OVALE CLOSURE;  Surgeon: Micheline Chapman, MD;  Location: [facility] CATH LAB;  Service: Cardiovascular;  Laterality: N/A;  . RADIOLOGY WITH ANESTHESIA N/A 05/29/2014   Procedure: RADIOLOGY WITH ANESTHESIA;  Surgeon: Oneal Grout, MD;  Location: [facility] OR;  Service: Radiology;  Laterality: N/A;  . TEE WITHOUT CARDIOVERSION N/A 06/03/2014   Procedure: TRANSESOPHAGEAL ECHOCARDIOGRAM (TEE);  Surgeon: Wendall Stade, MD;  Location: [facility] ENDOSCOPY;  Service: Cardiovascular;  Laterality: N/A;    Current Medications: Current Meds  Medication Sig  . carvedilol (COREG) 3.125 MG tablet Take 3.125 mg by mouth 2 (two) times daily with a meal.  . citalopram (CELEXA) 20 MG tablet Take 1 tablet (20 mg total) by mouth daily.  . clopidogrel (PLAVIX) 75 MG tablet Take 1 tablet (75 mg total) by mouth daily.  Marland Kitchen losartan (COZAAR) 25 MG tablet Take 25 mg by mouth daily.  . Multiple Vitamins-Minerals (MULTIVITAMIN WITH MINERALS) tablet Take 1 tablet by mouth daily.  . [DISCONTINUED] atorvastatin (  LIPITOR) 10 MG tablet Take 10 mg by mouth daily.      Allergies:   Patient has no known allergies.   Social History   Social History  . Marital status: Single    Spouse name: N/A  . Number of children: 2  . Years of education: Associates   Social History Main Topics  . Smoking status: Never Smoker  . Smokeless tobacco: Never Used  . Alcohol use No  . Drug use: No  . Sexual activity: No   Other Topics Concern  . None   Social History Narrative   Patient is single with 2 children.   Patient is right handed.   Patient has her Associates degree.   Patient drinks 1 soda a month.       Family History:  The patient's family history includes Hypertension in her father and mother; Stroke in her father, maternal grandfather, and paternal grandfather.   ROS:   Please see the history of present illness.    ROS All other systems reviewed and are negative.   EKGs/Labs/Other Test Reviewed:    EKG:  EKG is  ordered today.  The ekg ordered today demonstrates NSR, HR 81, normal axis, subtle TWI 2, 3, aVF/non-specific changes, QTc 487 ms  Recent Labs: No results found for requested labs within last 8760 hours.   Recent Lipid Panel    Component Value Date/Time   CHOL 173 05/30/2014 1200   TRIG 225 (H) 05/30/2014 1200   HDL 31 (L) 05/30/2014 1200   CHOLHDL 5.6 05/30/2014 1200   VLDL 45 (H) 05/30/2014 1200   LDLCALC 97 05/30/2014 1200     Physical Exam:    VS:  BP 102/60 (BP Location: Left Arm, Patient Position: Sitting, Cuff Size: Large)   Pulse 81   Ht  (1.778 m)   Wt (!) 328 lb 12.8 oz (149.1 kg)   BMI 47.18 kg/m     Wt Readings from Last 3 Encounters:  06/09/16 (!) 328 lb 12.8 oz (149.1 kg)  04/14/16 (!) 322 lb 9.6 oz (146.3 kg)  03/09/16 (!) 320 lb 6.4 oz (145.3 kg)     Physical Exam  Constitutional: She is oriented to person, place, and time. She appears well-developed and well-nourished. No distress.  HENT:  Head: Normocephalic and atraumatic.  Eyes: No scleral icterus.  Neck: No JVD present.  Cardiovascular: Normal rate, regular rhythm and normal heart sounds.   No murmur heard. Pulmonary/Chest: Effort normal. She has no wheezes. She has no rales.  Abdominal: Soft. There is no tenderness.  Musculoskeletal: She exhibits no edema.  Neurological: She is alert and oriented to person, place, and time.  Skin: Skin is warm and dry.  Psychiatric: She has a normal mood and affect.    ASSESSMENT:    1. Chronic systolic heart failure (HCC)   2. History of CVA (cerebrovascular accident)   3. PFO (patent foramen ovale)   4. Hyperlipidemia LDL  goal <70    PLAN:    In order of problems listed above:  1. Chronic systolic CHF - NYHA 2. Likely nonischemic cardiomyopathy. As noted, the patient could not tolerate cardiac MRI. Repeat echocardiogram planned after 6 months of medical therapy. Blood pressure will not likely tolerate further adjustment of medications. I did suggest changing carvedilol to metoprolol succinate given recent history of lightheadedness. However, the patient prefers to stay on her current medical regimen. She does note dyspnea with exertion. She does not appear to be volume overloaded on exam.  However, exam is difficult.  -  Continue carvedilol, losartan  -  Obtain BMET, BNP today.  Add low-dose diuretic if BNP elevated.  2. History of CVA - Continue follow-up with neurology.  3. PFO - s/p closure. Recent echo with well-seated closure device.  4. HL - Goal LDL less than 70 per neurology. She had recently had her statin changed. She prefers to remain on Crestor.  -  DC Lipitor  -  Start Crestor 5 mg daily  -  Arrange lipids and LFTs in 3 months   Medication Adjustments/Labs and Tests Ordered: Current medicines are reviewed at length with the patient today.  Concerns regarding medicines are outlined above.  Medication changes, Labs and Tests ordered today are outlined in the Patient Instructions noted below. Patient Instructions  Medication Instructions:  1. STOP LIPITOR 2. START CRESTOR 5 MG DAILY; RX HAS BEEN SENT IN  Labwork: 1. TODAY BMET, BNP 2. IN 3 MONTHS YOU WILL NEED FASTING LIPID AND LIVER PANEL; 1 WEEK BEFORE YOUR APPT WITH DR. Excell Seltzer  Testing/Procedures: Your physician has requested that you have an echocardiogram THIS IS TO BE DONE IN 3 MONTHS 1 WEEK BEFORE DR. Excell Seltzer APPT. Echocardiography is a painless test that uses sound waves to create images of your heart. It provides your doctor with information about the size and shape of your heart and how well your heart's chambers and valves are  working. This procedure takes approximately one hour. There are no restrictions for this procedure.  Follow-Up: DR. Excell Seltzer 3 MONTHS   Any Other Special Instructions Will Be Listed Below (If Applicable).  If you need a refill on your cardiac medications before your next appointment, please call your pharmacy.  Signed, Tereso Newcomer, PA-C  06/09/2016 1:23 PM    [facility] [address] [place], [place], [place]  [address] Phone: [phone]; Fax: [phone]

## 2016-06-09 NOTE — Addendum Note (Signed)
Addended by: Channing Mutters on: 06/09/2016 01:28 PM   Modules accepted: Orders

## 2016-06-09 NOTE — Patient Instructions (Addendum)
Medication Instructions:  1. STOP LIPITOR 2. START CRESTOR 5 MG DAILY; RX HAS BEEN SENT IN  Labwork: 1. TODAY BMET, BNP 2. IN 3 MONTHS YOU WILL NEED FASTING LIPID AND LIVER PANEL; 1 WEEK BEFORE YOUR APPT WITH DR. Excell Seltzer  Testing/Procedures: Your physician has requested that you have an echocardiogram THIS IS TO BE DONE IN 3 MONTHS 1 WEEK BEFORE DR. Excell Seltzer APPT. Echocardiography is a painless test that uses sound waves to create images of your heart. It provides your doctor with information about the size and shape of your heart and how well your heart's chambers and valves are working. This procedure takes approximately one hour. There are no restrictions for this procedure.  Follow-Up: DR. Excell Seltzer 3 MONTHS   Any Other Special Instructions Will Be Listed Below (If Applicable).  If you need a refill on your cardiac medications before your next appointment, please call your pharmacy.

## 2016-06-10 LAB — PRO B NATRIURETIC PEPTIDE: NT-Pro BNP: 5 pg/mL (ref 0–130)

## 2016-06-10 LAB — BASIC METABOLIC PANEL
BUN/Creatinine Ratio: 12 (ref 9–23)
BUN: 8 mg/dL (ref 6–24)
CALCIUM: 9.2 mg/dL (ref 8.7–10.2)
CO2: 21 mmol/L (ref 18–29)
Chloride: 104 mmol/L (ref 96–106)
Creatinine, Ser: 0.68 mg/dL (ref 0.57–1.00)
GFR calc non Af Amer: 110 mL/min/{1.73_m2} (ref >59)
GFR, EST AFRICAN AMERICAN: 127 mL/min/{1.73_m2} (ref >59)
Glucose: 77 mg/dL (ref 65–99)
POTASSIUM: 4.4 mmol/L (ref 3.5–5.2)
Sodium: 141 mmol/L (ref 134–144)

## 2016-06-12 ENCOUNTER — Telehealth: Payer: Self-pay | Admitting: *Deleted

## 2016-06-12 NOTE — Telephone Encounter (Signed)
Follow Up:; ° ° °Returning your call. °

## 2016-06-12 NOTE — Telephone Encounter (Signed)
Left message with

## 2016-06-12 NOTE — Telephone Encounter (Signed)
Pt notified of lab results by phone with verbal understanding.  

## 2016-07-04 ENCOUNTER — Other Ambulatory Visit: Payer: Self-pay | Admitting: Neurology

## 2016-07-04 DIAGNOSIS — I639 Cerebral infarction, unspecified: Secondary | ICD-10-CM

## 2016-08-23 ENCOUNTER — Encounter: Payer: Self-pay | Admitting: Cardiovascular Disease

## 2016-09-04 ENCOUNTER — Other Ambulatory Visit: Payer: 59

## 2016-09-04 ENCOUNTER — Other Ambulatory Visit: Payer: Self-pay

## 2016-09-04 ENCOUNTER — Encounter: Payer: Self-pay | Admitting: Physician Assistant

## 2016-09-04 ENCOUNTER — Ambulatory Visit (HOSPITAL_COMMUNITY): Payer: 59 | Attending: Cardiology

## 2016-09-04 DIAGNOSIS — E785 Hyperlipidemia, unspecified: Secondary | ICD-10-CM | POA: Insufficient documentation

## 2016-09-04 DIAGNOSIS — I5022 Chronic systolic (congestive) heart failure: Secondary | ICD-10-CM | POA: Insufficient documentation

## 2016-09-04 DIAGNOSIS — I340 Nonrheumatic mitral (valve) insufficiency: Secondary | ICD-10-CM | POA: Insufficient documentation

## 2016-09-04 DIAGNOSIS — Q211 Atrial septal defect: Secondary | ICD-10-CM | POA: Diagnosis not present

## 2016-09-04 DIAGNOSIS — Z8673 Personal history of transient ischemic attack (TIA), and cerebral infarction without residual deficits: Secondary | ICD-10-CM | POA: Insufficient documentation

## 2016-09-04 DIAGNOSIS — Q2112 Patent foramen ovale: Secondary | ICD-10-CM

## 2016-09-04 DIAGNOSIS — I071 Rheumatic tricuspid insufficiency: Secondary | ICD-10-CM | POA: Insufficient documentation

## 2016-09-05 ENCOUNTER — Telehealth: Payer: Self-pay | Admitting: *Deleted

## 2016-09-05 NOTE — Telephone Encounter (Signed)
Lmtcb to go over echo results.  

## 2016-09-05 NOTE — Telephone Encounter (Signed)
-----   Message from Scott T Weaver, PA-C sent at 09/04/2016  4:58 PM EDT ----- Please call the patient. The echocardiogram now demonstrates normal heart function. PFO device remains in place without residual shunt. Continue current treatment plan and follow up with Dr. Michael Cooper next week as planned.  Please fax a copy of this study result to her PCP:  No primary care provider on file.  Thanks! Scott Weaver, PA-C    09/04/2016 4:54 PM 

## 2016-09-07 LAB — LIPID PANEL
CHOL/HDL RATIO: 2.7 ratio (ref 0.0–4.4)
Cholesterol, Total: 133 mg/dL (ref 100–199)
HDL: 49 mg/dL (ref >39)
LDL Calculated: 58 mg/dL (ref 0–99)
Triglycerides: 130 mg/dL (ref 0–149)
VLDL CHOLESTEROL CAL: 26 mg/dL (ref 5–40)

## 2016-09-07 LAB — HEPATIC FUNCTION PANEL
ALK PHOS: 71 IU/L (ref 39–117)
ALT: 14 IU/L (ref 0–32)
AST: 19 IU/L (ref 0–40)
Albumin: 4.3 g/dL (ref 3.5–5.5)
Bilirubin Total: 0.2 mg/dL (ref 0.0–1.2)
Bilirubin, Direct: 0.08 mg/dL (ref 0.00–0.40)
TOTAL PROTEIN: 7.4 g/dL (ref 6.0–8.5)

## 2016-09-08 NOTE — Telephone Encounter (Signed)
Pt has been notified of echo and lab results by phone with verbal understanding to results given. Pt agreeable to keep appt with Dr. Excell Seltzer 09/11/16. Pt states she is still trying to find a PCP. I advised pt that our office has a list of PCP's that we can give her. Advised pt when she comes in Monday to see Dr. Excell Seltzer to ask about the list of PCP that we can give her. Pt thanked me for my call today and my help about a PCP.

## 2016-09-08 NOTE — Telephone Encounter (Signed)
-----   Message from Beatrice Lecher, [place] sent at 09/04/2016  4:58 PM EDT ----- Please call the patient. The echocardiogram now demonstrates normal heart function. PFO device remains in place without residual shunt. Continue current treatment plan and follow up with Dr. Tonny Bollman next week as planned.  Please fax a copy of this study result to her PCP:  No primary care provider on file.  Thanks! Tereso Newcomer, PA-C    09/04/2016 4:54 PM

## 2016-09-11 ENCOUNTER — Ambulatory Visit (INDEPENDENT_AMBULATORY_CARE_PROVIDER_SITE_OTHER): Payer: 59 | Admitting: Cardiovascular Disease

## 2016-09-11 ENCOUNTER — Encounter: Payer: Self-pay | Admitting: Cardiovascular Disease

## 2016-09-11 VITALS — BP 110/78 | HR 72 | Ht 70.0 in | Wt 332.4 lb

## 2016-09-11 DIAGNOSIS — I428 Other cardiomyopathies: Secondary | ICD-10-CM

## 2016-09-11 DIAGNOSIS — Q2112 Patent foramen ovale: Secondary | ICD-10-CM

## 2016-09-11 DIAGNOSIS — E785 Hyperlipidemia, unspecified: Secondary | ICD-10-CM

## 2016-09-11 DIAGNOSIS — Q211 Atrial septal defect: Secondary | ICD-10-CM

## 2016-09-11 NOTE — Progress Notes (Signed)
Cardiology Office Note Date:  09/11/2016   ID:  Rebecca Russo, DOB 1975-12-29, MRN [id number]  PCP:  No PCP Per Patient  Cardiologist:  Tonny Bollman, MD    Chief Complaint  Patient presents with  . 3 month follow up     History of Present Illness: Rebecca Russo is a 41 y.o. female who presents for  follow-up evaluation. She underwent transcatheter PFO closure 07/09/2014 with a 25 mm Amplatzer Cribriform ASO device. She initially presented in January of that year with a left MCA infarct and she underwent mechanical thrombectomy. During the course of her evaluation, there was no clear etiology of her stroke except for the presence of a large PFO and associated atrial septal aneurysm. Other evaluation showed no evidence of a hypercoagulable disorder or any vascular disease.  The patient is here alone today. She is really doing very well. She has been talking as much as she can and has noticed some improvements in her speech. She still has difficulty with certain words. She denies any chest pain or shortness of breath. She denies leg swelling, orthopnea, or PND. She has been doing some walking for exercise with no exertional symptoms. She tried to do some high intensity classes at the [facility] but could not tolerate this because of heart palpitations. She was unable to tolerate carvedilol or losartan because of dizziness. She actually had a few falls because she was so lightheaded. She stopped these medicines several months ago and feels much better since she has been off of them.   Past Medical History:  Diagnosis Date  . Aphasia complicating stroke   . Chronic systolic heart failure (HCC) 06/09/2016   a - Prob nonischemic //  Echo 10/17: EF 40-45, diffuse HK, mild LAE, PFO closure device without residual shunt  //  Could not tol cMRI // Echo 4/18: EF 55, normal wall motion, normal diastolic function, trivial MR, PFO device in place without residual shunt, PASP 24  . H/O varicella   .  Hemiplegia affecting dominant side (HCC)   . History of ischemic left MCA stroke   . Increased BMI 12/17/08  . PFO with atrial septal aneurysm 06/04/2014   a - s/p transcatheter closure in 2/16 with 25 mm Amplatzer Cribiform ASO device // b - Echo 10/17:  EF 40-45, diffuse HK, mild LAE, PFO closure device without residual shunt     Past Surgical History:  Procedure Laterality Date  . LAPAROSCOPIC CHOLECYSTECTOMY  06/20/2012  . PATENT FORAMEN OVALE CLOSURE  07/09/2014  . PATENT FORAMEN OVALE CLOSURE N/A 07/09/2014   Procedure: PATENT FORAMEN OVALE CLOSURE;  Surgeon: Micheline Chapman, MD;  Location[facility] CATH LAB;  Service: Cardiovascular;  Laterality: N/A;  . RADIOLOGY WITH ANESTHESIA N/A 05/29/2014   Procedure: RADIOLOGY WITH ANESTHESIA;  Surgeon: Oneal Grout, MD;  Location: [facility] OR;  Service: Radiology;  Laterality: N/A;  . TEE WITHOUT CARDIOVERSION N/A 06/03/2014   Procedure: TRANSESOPHAGEAL ECHOCARDIOGRAM (TEE);  Surgeon: Wendall Stade, MD;  Location: [facility] ENDOSCOPY;  Service: Cardiovascular;  Laterality: N/A;    Current Outpatient Prescriptions  Medication Sig Dispense Refill  . carvedilol (COREG) 3.125 MG tablet Take 3.125 mg by mouth 2 (two) times daily with a meal.    . citalopram (CELEXA) 20 MG tablet Take 1 tablet (20 mg total) by mouth daily. 90 tablet 3  . clopidogrel (PLAVIX) 75 MG tablet Take 1 tablet (75 mg total) by mouth daily. 90 tablet 3  . losartan (COZAAR) 25 MG tablet  Take 25 mg by mouth daily.    . Multiple Vitamins-Minerals (MULTIVITAMIN WITH MINERALS) tablet Take 1 tablet by mouth daily.    . rosuvastatin (CRESTOR) 5 MG tablet Take 1 tablet (5 mg total) by mouth daily. 90 tablet 3   No current facility-administered medications for this visit.     Allergies:   Patient has no known allergies.   Social History:  The patient  reports that she has never smoked. She has never used smokeless tobacco. She reports that she does not drink alcohol or use drugs.   Family  History:  The patient's  family history includes Hypertension in her father and mother; Stroke in her father, maternal grandfather, and paternal grandfather.    ROS:  Please see the history of present illness.   All other systems are reviewed and negative.    PHYSICAL EXAM: VS:  BP 110/78   Pulse 72   Ht  (1.778 m)   Wt (!) 332 lb 6.4 oz (150.8 kg)   BMI 47.69 kg/m  , BMI Body mass index is 47.69 kg/m. GEN: Well nourished, well developed, pleasant obese woman in no acute distress  HEENT: normal  Neck: no JVD, no masses. No carotid bruits Cardiac: RRR without murmur or gallop                Respiratory:  clear to auscultation bilaterally, normal work of breathing GI: soft, nontender, nondistended, + BS MS: no deformity or atrophy  Ext: no pretibial edema, pedal pulses 2+= bilaterally Skin: warm and dry, no rash Neuro:  Strength and sensation are intact Psych: euthymic mood, full affect  EKG:  EKG is not ordered today.  Recent Labs: 06/09/2016: BUN 8; Creatinine, Ser 0.68; NT-Pro BNP 5; Potassium 4.4; Sodium 141 09/06/2016: ALT 14   Lipid Panel     Component Value Date/Time   CHOL 133 09/06/2016 1322   TRIG 130 09/06/2016 1322   HDL 49 09/06/2016 1322   CHOLHDL 2.7 09/06/2016 1322   CHOLHDL 5.6 05/30/2014 1200   VLDL 45 (H) 05/30/2014 1200   LDLCALC 58 09/06/2016 1322      Wt Readings from Last 3 Encounters:  09/11/16 (!) 332 lb 6.4 oz (150.8 kg)  06/09/16 (!) 328 lb 12.8 oz (149.1 kg)  04/14/16 (!) 322 lb 9.6 oz (146.3 kg)     Cardiac Studies Reviewed: 2D Echo 09-04-2016: Study Conclusions  - Left ventricle: The cavity size was normal. Wall thickness was   normal. The estimated ejection fraction was 55%. Wall motion was   normal; there were no regional wall motion abnormalities. Left   ventricular diastolic function parameters were normal. - Aortic valve: There was no stenosis. - Mitral valve: There was trivial regurgitation. - Right ventricle: The  cavity size was normal. Systolic function   was normal. - Atrial septum: PFO closure device in place, no definite residual   shunt identified. - Tricuspid valve: Peak RV-RA gradient (S): 21 mm Hg. - Pulmonary arteries: PA peak pressure: 24 mm Hg (S). - Inferior vena cava: The vessel was normal in size. The   respirophasic diameter changes were in the normal range (= 50%),   consistent with normal central venous pressure.  Impressions:  - Normal LV size with EF 55%. Normal RV size and systolic function.   Normal diastolic function. No significant valvular abnormalities.   PFO closure device in place, no definite residual shunt noted.  ASSESSMENT AND PLAN: 1.  PFO with cryptogenic stroke: pt 2 years out  from PFO closure with no residual problems. Continue plavix for long-term antiplatelet Rx.  2. Cardiomyopathy: LVEF 40-45% by prior echo, now normalized on recent study. Unable to tolerate medical therapy because of symptomatic hypotension. Fortunately with normalization of her LVEF, I think we can observe her at the present time without any medical treatment.  3. Morbid obesity BMI greater than 40: Counseling done regarding the need for lifestyle modification, exercise, and weight loss.  4. Hyperlipidemia: Recent lipids with an LDL cholesterol of 58 mg/dL, HDL 49. She will continue on Crestor at low doses she seems to be tolerating this well and her lipids are at goal.  Current medicines are reviewed with the patient today.  The patient does not have concerns regarding medicines.  Labs/ tests ordered today include:  No orders of the defined types were placed in this encounter.   Disposition:   FU one year with lipids and LFT's prior to that visit.   Enzo Bi, MD  09/11/2016 2:59 PM    [facility] [address] [place], [place], [place]  [address] Phone: [phone]; Fax: [phone]

## 2016-09-11 NOTE — Patient Instructions (Signed)
Medication Instructions:  Your physician recommends that you continue on your current medications as directed. Please refer to the Current Medication list given to you today.  Labwork: Your physician recommends that you return for a FASTING LIPID and LIVER in 1 YEAR--nothing to eat or drink after midnight, lab opens at 7:30 AM.   Testing/Procedures: No new orders.   Follow-Up: Your physician wants you to follow-up in: 1 YEAR with Dr Cooper. You will receive a reminder letter in the mail two months in advance. If you don't receive a letter, please call our office to schedule the follow-up appointment.   Any Other Special Instructions Will Be Listed Below (If Applicable).     If you need a refill on your cardiac medications before your next appointment, please call your pharmacy.   

## 2016-10-23 ENCOUNTER — Ambulatory Visit: Payer: Managed Care, Other (non HMO) | Admitting: Neurology

## 2016-11-07 ENCOUNTER — Encounter: Payer: Self-pay | Admitting: Neurology

## 2016-11-07 ENCOUNTER — Ambulatory Visit (INDEPENDENT_AMBULATORY_CARE_PROVIDER_SITE_OTHER): Payer: 59 | Admitting: Neurology

## 2016-11-07 VITALS — BP 117/74 | HR 69 | Ht 70.0 in | Wt 337.8 lb

## 2016-11-07 DIAGNOSIS — I253 Aneurysm of heart: Secondary | ICD-10-CM

## 2016-11-07 DIAGNOSIS — F329 Major depressive disorder, single episode, unspecified: Secondary | ICD-10-CM

## 2016-11-07 DIAGNOSIS — E785 Hyperlipidemia, unspecified: Secondary | ICD-10-CM

## 2016-11-07 DIAGNOSIS — I63412 Cerebral infarction due to embolism of left middle cerebral artery: Secondary | ICD-10-CM

## 2016-11-07 DIAGNOSIS — F32A Depression, unspecified: Secondary | ICD-10-CM

## 2016-11-07 DIAGNOSIS — Q211 Atrial septal defect: Secondary | ICD-10-CM

## 2016-11-07 DIAGNOSIS — Q2112 Patent foramen ovale: Secondary | ICD-10-CM

## 2016-11-07 NOTE — Progress Notes (Signed)
STROKE NEUROLOGY FOLLOW UP NOTE  NAME: Rebecca Russo DOB: 03/05/1976  REASON FOR VISIT: stroke follow up HISTORY FROM: pt and mom and chart  Today we had the pleasure of seeing Rebecca Russo in follow-up at our Neurology Clinic. Pt was accompanied by mom.   History Summary [name] is a 41 y.o. Obese female with no significant past medical history presenting with right hemiparesis, aphasia and left gaze on waking up on 06/04/14. The patient did not receive TPA secondary to unknown time of onset. Perfusion scan revealed left brain large penumbra. 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 She is status post mechanical thrombectomy with TICI3 recannulization. Repeat CTA head and neck showed patent vessels. Etiology for stroke not clear, stroke work up so far negative except large PFO with mobile ASA. Risk factor including progestin implant (Nexplanon) use for contraception , obesity, large PFO with mobile ASA.discharged with ASA and crestor for stroke prevention.   07/07/14 follow up - the patient has been doing well. She is going to get speech and PT outpt rehab. She also followed up with Dr. Excell Seltzer and was put on dural antiplatelet and will do the endovascular PFO closure 07/09/14. After discharge, she went to ER twice for left knee pain with nodule and with chest pain which considered to be due to anxiety and stress. She feels great today. Her speech is much better and only has mild right hand dexterity difficulty. She has Nexplanon implant at left arm, which she got just before the stroke and lasts for one year.   09/14/14 follow up - the patient was doing well. She had PFO closure procedure with Dr. Excell Seltzer on 07/09/14 and went successful. She then had 2D echo repeat which showed stable cardiac function. She is to have 2D echo bubble study in June to evaluate the closure device. She was told to be on dural antiplatelet for 6 months after the procedure.   Pt still has speech  therapy 3 times per week, still has deficit with expression but improved from previous. She still has sensory deficit on her right hand. Easily feel tired but also admit the depressed mood and not able to be back to work. Is dealing with long term disability now. She had Nexplanon implant removed but was put on [name] which is progestin-only OCP for birth control.  02/02/15 follow up - the patient had been doing well, no recurrent stroke symptoms. She saw Dr. Excell Seltzer on 01/15/2015 for follow-up, doing well, recommend mono antiplatelet therapy instead of the dural antiplatelet therapy. She still has mild to moderate expressive aphasia, significant hesitancy on speech. Right hand weakness much improved. However, she had significant depression, decreased appetite, decreased concentration attention, memory loss, and social withdrawal. Still on Celexa 10 g daily. She had one episode of syncope last months, consistent with vasovagal events as per Dr. Excell Seltzer.  08/02/15 follow up - the patient has been doing the same. No recurrent stroke symptoms. However, pt still has depressed mood and has not seen any psychology or psychiatry yet. Last referral was told to be out of network. Still has moderate expressive aphasia and seems not doing any home speech exercise and no improvement of speech during the interval time. BP 111/71. On celexa, but not crestor as the latter was too expensive.  04/14/16 follow up - pt has been doing the same. Still complains of speech difficulty and right hand numbness and decreased dexterity. She speaks better with slowing down. Trying to  look for new jobs but not successful. Had recent follow up with Dr. Excell Seltzer and found to have decreased EF from 50-55% down to 40-45%. She was put on low dose coreg and losartan. Insurance did not cover psychology and psychiatry as per pt mom. BP stable at 113/66.   Interval History During the interval time, pt has been doing the same. She follows with Dr.  Excell Seltzer and repeat TTE showed EF 55% now. Her coreg stopped due to dizziness. She is still on plavix and crestor. BP 117/74 today. Encouraged her to find a new job.   REVIEW OF SYSTEMS: Full 14 system review of systems performed and notable only for those listed below and in HPI above, all others are negative:  Constitutional:   Cardiovascular:  Ear/Nose/Throat:   Skin:  Eyes:   Respiratory:   Gastroitestinal:   Genitourinary:  Hematology/Lymphatic:   Endocrine: Musculoskeletal:   Allergy/Immunology:   Neurological:   Psychiatric:  Sleep: restless leg, insomnia  The following represents the patient's updated allergies and side effects list: Allergies  Allergen Reactions  . No Known Allergies     The neurologically relevant items on the patient's problem list were reviewed on today's visit.  Neurologic Examination  A problem focused neurological exam (12 or more points of the single system neurologic examination, vital signs counts as 1 point, cranial nerves count for 8 points) was performed.  Blood pressure 117/74, pulse 69, height 5\' 10"  (1.778 m), weight (!) 337 lb 12.8 oz (153.2 kg).  General - Morbid obesity, well developed, in no apparent distress.  Ophthalmologic - Sharp disc margins OU.  Cardiovascular - Regular rate and rhythm with no murmur.  Mental Status -  Level of arousal and orientation to time, place, and person were intact. Partial expressive aphasia with hesitancy of speech and word finding difficulties. Able to repeat simple sentences, mild deficit on naming, intact on comprehension.   Cranial Nerves II - XII - II - Visual field intact OU. III, IV, VI - Extraocular movements intact. V - Facial sensation intact bilaterally. VII - Facial movement intact bilaterally. VIII - Hearing & vestibular intact bilaterally. X - Palate elevates symmetrically. XI - Chin turning & shoulder shrug intact bilaterally. XII - Tongue protrusion intact.  Motor Strength  - The patient's strength was normal in all extremities and pronator drift was absent except subtle right hand dexterity deficit.  Bulk was normal and fasciculations were absent.   Motor Tone - Muscle tone was assessed at the neck and appendages and was normal.  Reflexes - The patient's reflexes were normal in all extremities and she had no pathological reflexes.  Sensory - Light touch, temperature/pinprick, vibration and proprioception, and Romberg testing were assessed and were normal.    Coordination - The patient had normal movements in the hands and feet with no ataxia or dysmetria.  Tremor was absent.  Gait and Station - The patient's transfers, posture, gait, station, and turns were observed as normal.  Data reviewed: I personally reviewed the images and agree with the radiology interpretations.  Ct Head Wo Contrast 05/30/2014 Subtle CT changes of left MCA territory infarct, most apparent at the insula, with no associated hemorrhage or mass effect. No new intracranial abnormality.  05/29/2014 Subtle decreased sulcation in the left cerebral hemisphere raising the question for diffuse edema. No evidence for acute intracranial hemorrhage.  05/29/2014 Motion degraded exam showing no definite acute stroke or hemorrhage. Hyperdense LEFT ICA suggesting proximal large vessel occlusion   Ct Cerebral Perfusion  W/cm 05/29/2014 There could be a moderate-sized ischemic penumbra in the LEFT hemisphere in this patient with suspected LEFT ICA occlusion. At the time of dictation, the patient is in interventional radiology in preparation for possible thrombolysis.   Cerebral Angiogram 05/29/2014 LT ICA and LT T occlusion in isolated Lt cerebral hemisphere. S/P complete TIC 3 revascularization using 2 passes with Trevoprovue 4 mm x 30 mm retrieval device and 8 mg of superselective intracranial intraarterial Integrelin.  Dg Chest Port 1 View 05/31/2014 Extubation, with low lung volumes but no  acute disease. 05/30/2014 Low lung volumes with mild right basilar atelectasis.   Dg Abd Portable 1v 05/29/2014 1. OG tube tip in the proximal body of the stomach. 2. Early/incipient small bowel ileus.   MRI/MRA of the brain Patient refused  CTA head and neck 06/01/14 1. Collected evolution of infarcts involving the left insular cortex and segmental areas of the left anterior MCA distribution. This territory is significantly smaller than the area of ischemia identified on the CT perfusion scan. 2. Persistent patency of the left internal carotid artery. 3. Slight irregularity of the distal left ICA. This may represent fibromuscular dysplasia. 4. Evidence luxury perfusion within the infarcted territories of the left frontal lobe.  2D Echocardiogram  06/01/14  - Left ventricle: The cavity size was normal. Wall thickness was normal. Systolic function was normal. The estimated ejection fraction was in the range of 55% to 60%. Wall motion was normal; there were no regional wall motion abnormalities. Dopplerparameters are consistent with abnormal left ventricularrelaxation (grade 1 diastolic dysfunction). Impressions: No cardiac source of emboli was indentified.  08/14/14  - Left ventricle: The cavity size was mildly dilated. Wall thickness was normal. Systolic function was normal. The estimated ejection fraction was in the range of 50% to 55%. Wall motion was normal; there were no regional wall motion abnormalities. - Atrial septum: A closure device was present. Impressions: - Limited study to R/O pericardial effusion; low normal LV function; atrial septal closure device noted with no residual shunt; no pericardial effusion.  LE venous doppler - negative for DVT  EKG Sinus rhythm. 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 Wall   thickness was normal. Systolic function was mildly to moderately   reduced. The estimated ejection fraction was in the range of 40%   to 45%. Diffuse hypokinesis. Left ventricular diastolic function   parameters were normal. - Left atrium: The atrium was mildly dilated. - Atrial septum: A closure device was present. Impressions: - Mild to moderate gobal reduction in LV function; mild LAE; PFO   closure device noted with no residual shunt.  TTE 09/04/16 - Normal LV size with EF 55%. Normal RV size and systolic function.   Normal diastolic function. No significant valvular abnormalities.   PFO closure device in place, no definite residual shunt noted.   Component     Latest Ref Rng 05/29/2014 05/30/2014 05/30/2014          3:45 AM 12:00 PM  PTT Lupus Anticoagulant     28.0 - 43.0 secs 34.9    PTTLA Confirmation     <8.0 secs NOT APPL    PTTLA 4:1 Mix  28.0 - 43.0 secs NOT APPL    DRVVT     <42.9 secs 35.0    Drvvt confirmation     <1.15 Ratio NOT APPL    dRVVT Incubated 1:1 Mix     <42.9 secs NOT APPL    Lupus Anticoagulant     NOT DETECTED NOT DETECTED    Cholesterol     0 - 200 mg/dL  161 096  Triglycerides     <150 mg/dL  045 (H) 409 (H)  HDL     >39 mg/dL  33 (L) 31 (L)  Total CHOL/HDL Ratio       5.0 5.6  VLDL     0 - 40 mg/dL  43 (H) 45 (H)  LDL (calc)     0 - 99 mg/dL  89 97  Beta-2 Glyco I IgG     <20 G Units 14    Beta-2-Glycoprotein I IgM     <20 M Units 6    Beta-2-Glycoprotein I IgA     <20 A Units 6    Interpretation-F5LEID:      REPORT    Recommendations-F5LEID:      REPORT    Reviewer      REPORT    Interpretation-PTGENE:      REPORT    Recommendations-PTGENE:      REPORT    Reviewer      REPORT    Anticardiolipin IgG     <23 GPL U/mL 3 (L)    Anticardiolipin IgM     <11 MPL U/mL 4 (L)     Anticardiolipin IgA     <22 APL U/mL 10 (L)    c-ANCA Screen     NEGATIVE     p-ANCA Screen     NEGATIVE     Atypical p-ANCA Screen     NEGATIVE     Hemoglobin A1C     <5.7 %  5.6   Mean Plasma Glucose     <117 mg/dL  811   AntiThromb III Func     75 - 120 % 105    Protein C Activity     74 - 151 % 174 (H)    Protein C, Total     70 - 140 % 110    Protein S Activity     60 - 145 % 113    Protein S Ag, Total     58 - 150 % 128    Homocysteine     0.0 - 15.0 umol/L 8.1    ds DNA Ab          C3 Complement     90 - 180 mg/dL     Complement C4, Body Fluid     10 - 40 mg/dL     SSA (Ro) (ENA) Antibody, IgG     <1.0 NEG AI     SSB (La) (ENA) Antibody, IgG     <1.0 NEG AI     Rhuematoid fact SerPl-aCnc     <=14 IU/mL     Anit Nuclear Antibody(ANA)     NEGATIVE     Alpha galactosidase, serum     0.074 - 0.457 U/L     Sickle Cell Screen     NEGATIVE     D-Dimer, Quant     0.00 - 0.48 ug/mL-FEU      Component     Latest Ref Rng 06/02/2014 06/03/2014 06/27/2014            PTT  Lupus Anticoagulant     28.0 - 43.0 secs     PTTLA Confirmation     <8.0 secs     PTTLA 4:1 Mix     28.0 - 43.0 secs     DRVVT     <42.9 secs     Drvvt confirmation     <1.15 Ratio     dRVVT Incubated 1:1 Mix     <42.9 secs     Lupus Anticoagulant     NOT DETECTED     Cholesterol     0 - 200 mg/dL     Triglycerides     <932 mg/dL     HDL     >67 mg/dL     Total CHOL/HDL Ratio          VLDL     0 - 40 mg/dL     LDL (calc)     0 - 99 mg/dL     Beta-2 Glyco I IgG     <20 G Units     Beta-2-Glycoprotein I IgM     <20 M Units     Beta-2-Glycoprotein I IgA     <20 A Units     Interpretation-F5LEID:          Recommendations-F5LEID:          Reviewer          Interpretation-PTGENE:          Recommendations-PTGENE:          Reviewer          Anticardiolipin IgG     <23 GPL U/mL     Anticardiolipin IgM     <11 MPL U/mL     Anticardiolipin IgA     <22 APL U/mL       c-ANCA Screen     NEGATIVE NEGATIVE    p-ANCA Screen     NEGATIVE NEGATIVE    Atypical p-ANCA Screen     NEGATIVE NEGATIVE    Hemoglobin A1C     <5.7 %     Mean Plasma Glucose     <117 mg/dL     AntiThromb III Func     75 - 120 %     Protein C Activity     74 - 151 %     Protein C, Total     70 - 140 %     Protein S Activity     60 - 145 %     Protein S Ag, Total     58 - 150 %     Homocysteine     0.0 - 15.0 umol/L     ds DNA Ab      1    C3 Complement     90 - 180 mg/dL 124 (H)    Complement C4, Body Fluid     10 - 40 mg/dL 38    SSA (Ro) (ENA) Antibody, IgG     <1.0 NEG AI <1.0 NEG    SSB (La) (ENA) Antibody, IgG     <1.0 NEG AI <1.0 NEG    Rhuematoid fact SerPl-aCnc     <=14 IU/mL <10    Anit Nuclear Antibody(ANA)     NEGATIVE NEGATIVE    Alpha galactosidase, serum     0.074 - 0.457 U/L 0.262    Sickle Cell Screen     NEGATIVE  NEGATIVE   D-Dimer, Quant     0.00 - 0.48 ug/mL-FEU   0.40  Assessment: As you may recall, she is a 41 y.o. African American obese female with no significant past medical history was admitted on 06/04/14 for left MCA stroke. Pt refused MRI but CT repeat confirmed left MCA stroke. 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 She received mechanical thrombectomy with TICI3 recannulization.Repeat CTA head and neck showed patent vessels. Etiology for stroke not clear, stroke work up including TEE, TTE, CUS, hypercoagulable and autoimmue work up all negative except large PFO with mobile ASA. Risk factor including progestin implant (Nexplanon) use for contraception, obesity, and large PFO with mobile ASA.Due to lack of risk factors and young age, PFO closure was considered and Dr. Excell Seltzer did the procedure 07/09/14. She has removed Nexplanon implant but was put on progestin-only OCP. Completed dural antiplatelet for 6 months post PFO closure  procedure. Pt has post stroke depression, on low dose Celexa. She follow-up with Dr. Excell Seltzer again and recommend change from dural antiplatelet to mono antiplatelet therapy. Her post stroke depression continues, increased Celexa to 20 mg, however, insurance did not cover psychology and psychiatry referral as per mom. Switch from crestor to lipitor due to cost, but now back to low dose crestor. Her TTE in 02/2016 showed 40-45% and put on coreg but d/c due to dizziness side effect, repeat TTE in 08/2016 EF 55%.  Plan:  - continue plavix and crestor for stroke prevention. - continue celexa to  for post stroke depression - continue speech exercise at home.  - continue looking for job opportunities.  - Follow up with your primary care physician for stroke risk factor modification. Recommend maintain blood pressure goal <130/80, diabetes with hemoglobin A1c goal below 6.5% and lipids with LDL cholesterol goal below 70 mg/dL.  - follow up with Dr. Excell Seltzer as scheduled - follow up as needed.    No orders of the defined types were placed in this encounter.   Meds ordered this encounter  Medications  . DISCONTD: oxyCODONE-acetaminophen (PERCOCET/ROXICET) 5-325 MG tablet    Sig: oxycodone-acetaminophen 5 mg-325 mg tablet  . DISCONTD: citalopram (CELEXA) 20 MG tablet    Sig: citalopram 20 mg tablet  . DISCONTD: atorvastatin (LIPITOR) 10 MG tablet    Sig: atorvastatin 10 mg tablet  . DISCONTD: Rosuvastatin Calcium (CRESTOR PO)    Sig: Crestor  . levonorgestrel (MIRENA, 52 MG,) 20 MCG/24HR IUD    Sig: Mirena 20 mcg/24 hr (5 years) intrauterine device  Take 1 device by intrauterine route.  Marland Kitchen DISCONTD: Clopidogrel Bisulfate (PLAVIX PO)    Sig: clopidogrel    Patient Instructions  - continue plavix and crestor for stroke prevention. - continue celexa to  for post stroke depression - continue speech exercise at home.  - continue looking for job opportunities.  - Follow up with your primary  care physician for stroke risk factor modification. Recommend maintain blood pressure goal <130/80, diabetes with hemoglobin A1c goal below 6.5% and lipids with LDL cholesterol goal below 70 mg/dL.  - follow up with Dr. Excell Seltzer as scheduled - follow up as needed.    Marvel Plan, MD PhD [facility] [address] [place], [place] [address] [phone]

## 2016-11-07 NOTE — Patient Instructions (Signed)
-   continue plavix and crestor for stroke prevention. - continue celexa to 20mg  for post stroke depression - continue speech exercise at home.  - continue looking for job opportunities.  - Follow up with your primary care physician for stroke risk factor modification. Recommend maintain blood pressure goal <130/80, diabetes with hemoglobin A1c goal below 6.5% and lipids with LDL cholesterol goal below 70 mg/dL.  - follow up with Dr. Excell Seltzer as scheduled - follow up as needed.

## 2017-07-01 ENCOUNTER — Other Ambulatory Visit: Payer: Self-pay | Admitting: Cardiovascular Disease

## 2017-07-01 ENCOUNTER — Other Ambulatory Visit: Payer: Self-pay | Admitting: Physician Assistant

## 2017-07-01 DIAGNOSIS — Q211 Atrial septal defect: Secondary | ICD-10-CM

## 2017-07-01 DIAGNOSIS — Q2112 Patent foramen ovale: Secondary | ICD-10-CM

## 2017-07-01 DIAGNOSIS — I428 Other cardiomyopathies: Secondary | ICD-10-CM

## 2017-07-01 DIAGNOSIS — E785 Hyperlipidemia, unspecified: Secondary | ICD-10-CM

## 2017-08-17 ENCOUNTER — Telehealth: Payer: Self-pay

## 2017-08-17 NOTE — Telephone Encounter (Signed)
Called pt and LVM informing her that Dr. Laury Axon was not accepting new pts. Advised for her too call back to establish with another provider.

## 2017-08-17 NOTE — Telephone Encounter (Signed)
Copied from CRM [id number]. Topic: Appointment Scheduling - Scheduling Inquiry for Clinic >> Aug 17, 2017  2:40 PM Debroah Loop wrote: Reason for CRM: Patient would like a to request to become a new patient of Dr. Laury Axon. She is a Optician, dispensing.

## 2017-08-17 NOTE — Telephone Encounter (Signed)
Dr. Laury Axon not currently accepting new patients. There are other providers in the office who are though.

## 2017-08-20 MED FILL — CLOPIDOGREL 75 MG TABLET: 75 | 60 days supply | Qty: 60 | Fill #0

## 2017-08-20 MED FILL — ROSUVASTATIN CALCIUM 5 MG T: 5 | 60 days supply | Qty: 60 | Fill #0

## 2017-08-28 MED FILL — AMOXICILLIN 500 MG CAPSULE: 500 | 7 days supply | Qty: 28 | Fill #0

## 2017-08-28 MED FILL — IBUPROFEN 800 MG TAB: 800 | 5 days supply | Qty: 20 | Fill #0

## 2017-08-29 ENCOUNTER — Encounter: Payer: Self-pay | Admitting: Family Medicine

## 2017-08-29 ENCOUNTER — Other Ambulatory Visit: Payer: Self-pay

## 2017-08-29 ENCOUNTER — Ambulatory Visit (INDEPENDENT_AMBULATORY_CARE_PROVIDER_SITE_OTHER): Payer: 59 | Admitting: Family Medicine

## 2017-08-29 VITALS — BP 121/81 | HR 110 | Temp 98.9°F | Resp 17 | Ht 71.25 in | Wt 341.0 lb

## 2017-08-29 DIAGNOSIS — R5383 Other fatigue: Secondary | ICD-10-CM | POA: Diagnosis not present

## 2017-08-29 DIAGNOSIS — D508 Other iron deficiency anemias: Secondary | ICD-10-CM

## 2017-08-29 DIAGNOSIS — E781 Pure hyperglyceridemia: Secondary | ICD-10-CM

## 2017-08-29 DIAGNOSIS — Z131 Encounter for screening for diabetes mellitus: Secondary | ICD-10-CM

## 2017-08-29 DIAGNOSIS — Z0000 Encounter for general adult medical examination without abnormal findings: Secondary | ICD-10-CM | POA: Diagnosis not present

## 2017-08-29 DIAGNOSIS — Z833 Family history of diabetes mellitus: Secondary | ICD-10-CM | POA: Diagnosis not present

## 2017-08-29 NOTE — Progress Notes (Signed)
Chief Complaint  Patient presents with  . New Patient (Initial Visit)    establish care.  Would like to talk about weight.  Wants physical    Subjective:  Rebecca Russo is a 42 y.o. female here for a health maintenance visit.  Patient is new pt   She has a history of triglyceridemia, IDA, fatigue and morbid obesity She reports that she would like to figure out how to lose weight and how to keep it off    Patient Active Problem List   Diagnosis Date Noted  . Chronic systolic heart failure (HCC) 06/09/2016  . Depression 02/02/2015  . Morbid obesity (HCC) 06/04/2014  . Dysphagia, pharyngoesophageal phase 06/04/2014  . PFO with atrial septal aneurysm 06/04/2014  . Hyperlipidemia LDL goal <70 06/04/2014  . Ischemic brain damage   . Acute respiratory failure with hypoxia (HCC)   . Cerebrovascular accident (CVA) due to embolism of left middle cerebral artery (HCC) 05/29/2014  . Aphasia complicating stroke 05/29/2014  . Hemiplegia affecting dominant side (HCC) 05/29/2014  . CONSTIPATION 03/09/2008  . IRRITABLE BOWEL SYNDROME 03/09/2008    Past Medical History:  Diagnosis Date  . Aphasia complicating stroke   . Chronic systolic heart failure (HCC) 06/09/2016   a - Prob nonischemic //  Echo 10/17: EF 40-45, diffuse HK, mild LAE, PFO closure device without residual shunt  //  Could not tol cMRI // Echo 4/18: EF 55, normal wall motion, normal diastolic function, trivial MR, PFO device in place without residual shunt, PASP 24  . H/O varicella   . Hemiplegia affecting dominant side (HCC)   . History of ischemic left MCA stroke   . Increased BMI 12/17/08  . PFO with atrial septal aneurysm 06/04/2014   a - s/p transcatheter closure in 2/16 with 25 mm Amplatzer Cribiform ASO device // b - Echo 10/17:  EF 40-45, diffuse HK, mild LAE, PFO closure device without residual shunt     Past Surgical History:  Procedure Laterality Date  . LAPAROSCOPIC CHOLECYSTECTOMY  06/20/2012  . PATENT FORAMEN  OVALE CLOSURE  07/09/2014  . PATENT FORAMEN OVALE CLOSURE N/A 07/09/2014   Procedure: PATENT FORAMEN OVALE CLOSURE;  Surgeon: Micheline Chapman, MD;  Location: [facility] CATH LAB;  Service: Cardiovascular;  Laterality: N/A;  . RADIOLOGY WITH ANESTHESIA N/A 05/29/2014   Procedure: RADIOLOGY WITH ANESTHESIA;  Surgeon: Oneal Grout, MD;  Location: [facility] OR;  Service: Radiology;  Laterality: N/A;  . TEE WITHOUT CARDIOVERSION N/A 06/03/2014   Procedure: TRANSESOPHAGEAL ECHOCARDIOGRAM (TEE);  Surgeon: Wendall Stade, MD;  Location: [facility] ENDOSCOPY;  Service: Cardiovascular;  Laterality: N/A;     Outpatient Medications Prior to Visit  Medication Sig Dispense Refill  . clopidogrel (PLAVIX) 75 MG tablet TAKE ONE TABLET BY MOUTH ONCE DAILY 90 tablet 0  . levonorgestrel (MIRENA, 52 MG,) 20 MCG/24HR IUD Mirena 20 mcg/24 hr (5 years) intrauterine device  Take 1 device by intrauterine route.    . Multiple Vitamins-Minerals (MULTIVITAMIN WITH MINERALS) tablet Take 1 tablet by mouth daily.    . rosuvastatin (CRESTOR) 5 MG tablet TAKE ONE TABLET BY MOUTH ONCE DAILY 30 tablet 2  . citalopram (CELEXA) 20 MG tablet Take 1 tablet (20 mg total) by mouth daily. (Patient not taking: Reported on 08/29/2017) 90 tablet 3   No facility-administered medications prior to visit.     Allergies  Allergen Reactions  . No Known Allergies      Family History  Problem Relation Age of Onset  . Hypertension Mother   .  Hypertension Father   . Stroke Father   . Stroke Paternal Grandfather   . Stroke Maternal Grandfather      Health Habits: Dental Exam: up to date Eye Exam: up to date Exercise: 4 times/week on average Current exercise activities: walking/running Diet: balanced diet most times  Social History   Socioeconomic History  . Marital status: Single    Spouse name: Not on file  . Number of children: 2  . Years of education: Associates  . Highest education level: Not on file  Occupational History  . Not on  file  Social Needs  . Financial resource strain: Not on file  . Food insecurity:    Worry: Not on file    Inability: Not on file  . Transportation needs:    Medical: Not on file    Non-medical: Not on file  Tobacco Use  . Smoking status: Never Smoker  . Smokeless tobacco: Never Used  Substance and Sexual Activity  . Alcohol use: No    Alcohol/week: 0.0 oz  . Drug use: No  . Sexual activity: Never  Lifestyle  . Physical activity:    Days per week: Not on file    Minutes per session: Not on file  . Stress: Not on file  Relationships  . Social connections:    Talks on phone: Not on file    Gets together: Not on file    Attends religious service: Not on file    Active member of club or organization: Not on file    Attends meetings of clubs or organizations: Not on file    Relationship status: Not on file  . Intimate partner violence:    Fear of current or ex partner: Not on file    Emotionally abused: Not on file    Physically abused: Not on file    Forced sexual activity: Not on file  Other Topics Concern  . Not on file  Social History Narrative   Patient is single with 2 children.   Patient is right handed.   Patient has her Associates degree.   Patient drinks 1 soda a month.   Social History   Substance and Sexual Activity  Alcohol Use No  . Alcohol/week: 0.0 oz   Social History   Tobacco Use  Smoking Status Never Smoker  Smokeless Tobacco Never Used   Social History   Substance and Sexual Activity  Drug Use No    GYN: Sexual Health Menstrual status: regular menses LMP: No LMP recorded. (Menstrual status: IUD).  IUD mirena placed 2017 Last pap smear: see HM section History of abnormal pap smears: none Sexually active:  with female partner Current contraception: IUD  Health Maintenance: See under health Maintenance activity for review of completion dates as well.  There is no immunization history on file for this patient.    Depression  Screen-PHQ2/9 Depression screen [facility] 2/9 08/29/2017 08/29/2017  Decreased Interest 0 0  Down, Depressed, Hopeless 0 0  PHQ - 2 Score 0 0     Depression Severity and Treatment Recommendations:  0-4= None  5-9= Mild / Treatment: Support, educate to call if worse; return in one month  10-14= Moderate / Treatment: Support, watchful waiting; Antidepressant or Psycotherapy  15-19= Moderately severe / Treatment: Antidepressant OR Psychotherapy  >= 20 = Major depression, severe / Antidepressant AND Psychotherapy    Review of Systems   Review of Systems  Constitutional: Negative for chills and fever.  HENT: Negative for hearing loss, sinus pain and  tinnitus.   Eyes: Negative for blurred vision, double vision and photophobia.  Respiratory: Negative for cough, shortness of breath and wheezing.   Cardiovascular: Negative for chest pain, palpitations and orthopnea.  Gastrointestinal: Negative for abdominal pain, heartburn, nausea and vomiting.  Genitourinary: Negative for dysuria and urgency.  Musculoskeletal: Negative for back pain, falls, joint pain, myalgias and neck pain.  Skin: Negative for itching and rash.  Neurological: Positive for weakness. Negative for dizziness, tingling, seizures and headaches.       Right arm and leg with weakness on the right side after CVA due to PFO now repaired  Psychiatric/Behavioral: Negative for depression. The patient is not nervous/anxious.     See HPI for ROS as well.    Objective:   Vitals:   08/29/17 1354  BP: 121/81  Pulse: (!) 110  Resp: 17  Temp: 98.9 F (37.2 C)  TempSrc: Oral  SpO2: 98%  Weight: (!) 341 lb (154.7 kg)  Height: 5' 11.25" (1.81 m)    Body mass index is 47.23 kg/m.  Physical Exam  Constitutional: She is oriented to person, place, and time. She appears well-developed and well-nourished.  HENT:  Head: Normocephalic and atraumatic.  Right Ear: External ear normal.  Left Ear: External ear normal.  Eyes: Conjunctivae  and EOM are normal. Right eye exhibits no discharge. Left eye exhibits no discharge.  Neck: Normal range of motion. Neck supple. No thyromegaly present.  Cardiovascular: Normal rate, regular rhythm and normal heart sounds.  No murmur heard. Pulmonary/Chest: Effort normal and breath sounds normal. No stridor. No respiratory distress. She has no wheezes. She has no rales.  Abdominal: Soft. Bowel sounds are normal. She exhibits no distension and no mass. There is no tenderness. There is no guarding.  Musculoskeletal: Normal range of motion. She exhibits no edema.  Neurological: She is alert and oriented to person, place, and time.  Skin: Skin is warm. Capillary refill takes less than 2 seconds.  Psychiatric: She has a normal mood and affect. Her behavior is normal. Judgment and thought content normal.       Assessment/Plan:   Patient was seen for a health maintenance exam.  Counseled the patient on health maintenance issues. Reviewed her health mainteance schedule and ordered appropriate tests (see orders.) Counseled on regular exercise and weight management. Recommend regular eye exams and dental cleaning.   The following issues were addressed today for health maintenance:   Dominque was seen today for new patient (initial visit).  Diagnoses and all orders for this visit:  Encounter for health maintenance examination in adult  Morbid obesity (HCC)-  Discussed labs -     Comprehensive metabolic panel -     Lipid panel  Hypertriglyceridemia- will check levels -     Comprehensive metabolic panel -     Lipid panel  Family history of diabetes mellitus (DM)  Other iron deficiency anemia- will check for thyroid disease -     TSH  Other fatigue -     Comprehensive metabolic panel -     CBC -     TSH -     VITAMIN D 25 Hydroxy (Vit-D Deficiency, Fractures)  Screening for diabetes mellitus -     Hemoglobin A1c    No follow-ups on file.    Body mass index is 47.23 kg/m.:   Discussed the patient's BMI with patient. The BMI body mass index is 47.23 kg/m.     Future Appointments  Date Time Provider Department Center  09/19/2017 10:20  AM Doristine Bosworth, MD PCP-PCP [facility]    Patient Instructions       IF you received an x-ray today, you will receive an invoice from [facility]. Please contact [facility] at [phone] with questions or concerns regarding your invoice.   IF you received labwork today, you will receive an invoice from [place]. Please contact [facility] at [phone] with questions or concerns regarding your invoice.   Our billing staff will not be able to assist you with questions regarding bills from these companies.  You will be contacted with the lab results as soon as they are available. The fastest way to get your results is to activate your My Chart account. Instructions are located on the last page of this paperwork. If you have not heard from [place] regarding the results in 2 weeks, please contact this office.        Fatigue Fatigue is feeling tired all of the time, a lack of energy, or a lack of motivation. Occasional or mild fatigue is often a normal response to activity or life in general. However, long-lasting (chronic) or extreme fatigue may indicate an underlying medical condition. Follow these instructions at home: Watch your fatigue for any changes. The following actions may help to lessen any discomfort you are feeling:  Talk to your health care provider about how much sleep you need each night. Try to get the required amount every night.  Take medicines only as directed by your health care provider.  Eat a healthy and nutritious diet. Ask your health care provider if you need help changing your diet.  Drink enough fluid to keep your urine clear or pale yellow.  Practice ways of relaxing, such as yoga, meditation, massage therapy, or acupuncture.  Exercise regularly.  Change situations that cause you  stress. Try to keep your work and personal routine reasonable.  Do not abuse illegal drugs.  Limit alcohol intake to no more than 1 drink per day for nonpregnant women and 2 drinks per day for men. One drink equals 12 ounces of beer, 5 ounces of wine, or 1 ounces of hard liquor.  Take a multivitamin, if directed by your health care provider.  Contact a health care provider if:  Your fatigue does not get better.  You have a fever.  You have unintentional weight loss or gain.  You have headaches.  You have difficulty: ? Falling asleep. ? Sleeping throughout the night.  You feel angry, guilty, anxious, or sad.  You are unable to have a bowel movement (constipation).  You skin is dry.  Your legs or another part of your body is swollen. Get help right away if:  You feel confused.  Your vision is blurry.  You feel faint or pass out.  You have a severe headache.  You have severe abdominal, pelvic, or back pain.  You have chest pain, shortness of breath, or an irregular or fast heartbeat.  You are unable to urinate or you urinate less than normal.  You develop abnormal bleeding, such as bleeding from the rectum, vagina, nose, lungs, or nipples.  You vomit blood.  You have thoughts about harming yourself or committing suicide.  You are worried that you might harm someone else. This information is not intended to replace advice given to you by your health care provider. Make sure you discuss any questions you have with your health care provider. Document Released: 02/26/2007 Document Revised: 10/07/2015 Document Reviewed: 09/02/2013 Elsevier Interactive Patient Education  [employer].

## 2017-08-29 NOTE — Patient Instructions (Addendum)
     IF you received an x-ray today, you will receive an invoice from [facility]. Please contact [facility] at [phone] with questions or concerns regarding your invoice.   IF you received labwork today, you will receive an invoice from [facility]. Please contact [facility] at [phone] with questions or concerns regarding your invoice.   Our billing staff will not be able to assist you with questions regarding bills from these companies.  You will be contacted with the lab results as soon as they are available. The fastest way to get your results is to activate your My Chart account. Instructions are located on the last page of this paperwork. If you have not heard from us regarding the results in 2 weeks, please contact this office.      Fatigue Fatigue is feeling tired all of the time, a lack of energy, or a lack of motivation. Occasional or mild fatigue is often a normal response to activity or life in general. However, long-lasting (chronic) or extreme fatigue may indicate an underlying medical condition. Follow these instructions at home: Watch your fatigue for any changes. The following actions may help to lessen any discomfort you are feeling:  Talk to your health care provider about how much sleep you need each night. Try to get the required amount every night.  Take medicines only as directed by your health care provider.  Eat a healthy and nutritious diet. Ask your health care provider if you need help changing your diet.  Drink enough fluid to keep your urine clear or pale yellow.  Practice ways of relaxing, such as yoga, meditation, massage therapy, or acupuncture.  Exercise regularly.  Change situations that cause you stress. Try to keep your work and personal routine reasonable.  Do not abuse illegal drugs.  Limit alcohol intake to no more than 1 drink per day for nonpregnant women and 2 drinks per day for men. One drink equals 12 ounces of  beer, 5 ounces of wine, or 1 ounces of hard liquor.  Take a multivitamin, if directed by your health care provider.  Contact a health care provider if:  Your fatigue does not get better.  You have a fever.  You have unintentional weight loss or gain.  You have headaches.  You have difficulty: ? Falling asleep. ? Sleeping throughout the night.  You feel angry, guilty, anxious, or sad.  You are unable to have a bowel movement (constipation).  You skin is dry.  Your legs or another part of your body is swollen. Get help right away if:  You feel confused.  Your vision is blurry.  You feel faint or pass out.  You have a severe headache.  You have severe abdominal, pelvic, or back pain.  You have chest pain, shortness of breath, or an irregular or fast heartbeat.  You are unable to urinate or you urinate less than normal.  You develop abnormal bleeding, such as bleeding from the rectum, vagina, nose, lungs, or nipples.  You vomit blood.  You have thoughts about harming yourself or committing suicide.  You are worried that you might harm someone else. This information is not intended to replace advice given to you by your health care provider. Make sure you discuss any questions you have with your health care provider. Document Released: 02/26/2007 Document Revised: 10/07/2015 Document Reviewed: 09/02/2013 Elsevier Interactive Patient Education  2018 Elsevier Inc.  

## 2017-08-30 LAB — LIPID PANEL
CHOLESTEROL TOTAL: 152 mg/dL (ref 100–199)
Chol/HDL Ratio: 3.2 ratio (ref 0.0–4.4)
HDL: 48 mg/dL (ref >39)
LDL Calculated: 79 mg/dL (ref 0–99)
TRIGLYCERIDES: 123 mg/dL (ref 0–149)
VLDL CHOLESTEROL CAL: 25 mg/dL (ref 5–40)

## 2017-08-30 LAB — COMPREHENSIVE METABOLIC PANEL
ALK PHOS: 85 IU/L (ref 39–117)
ALT: 18 IU/L (ref 0–32)
AST: 22 IU/L (ref 0–40)
Albumin/Globulin Ratio: 1.3 (ref 1.2–2.2)
Albumin: 4.3 g/dL (ref 3.5–5.5)
BILIRUBIN TOTAL: 0.5 mg/dL (ref 0.0–1.2)
BUN/Creatinine Ratio: 10 (ref 9–23)
BUN: 8 mg/dL (ref 6–24)
CHLORIDE: 104 mmol/L (ref 96–106)
CO2: 23 mmol/L (ref 20–29)
Calcium: 9.4 mg/dL (ref 8.7–10.2)
Creatinine, Ser: 0.78 mg/dL (ref 0.57–1.00)
GFR calc Af Amer: 108 mL/min/{1.73_m2} (ref >59)
GFR calc non Af Amer: 94 mL/min/{1.73_m2} (ref >59)
GLUCOSE: 71 mg/dL (ref 65–99)
Globulin, Total: 3.3 g/dL (ref 1.5–4.5)
Potassium: 4.6 mmol/L (ref 3.5–5.2)
Sodium: 139 mmol/L (ref 134–144)
Total Protein: 7.6 g/dL (ref 6.0–8.5)

## 2017-08-30 LAB — HEMOGLOBIN A1C
Est. average glucose Bld gHb Est-mCnc: 114 mg/dL
Hgb A1c MFr Bld: 5.6 % (ref 4.8–5.6)

## 2017-08-30 LAB — CBC
HEMATOCRIT: 36.7 % (ref 34.0–46.6)
Hemoglobin: 12.2 g/dL (ref 11.1–15.9)
MCH: 30.7 pg (ref 26.6–33.0)
MCHC: 33.2 g/dL (ref 31.5–35.7)
MCV: 92 fL (ref 79–97)
Platelets: 417 10*3/uL — ABNORMAL HIGH (ref 150–379)
RBC: 3.98 x10E6/uL (ref 3.77–5.28)
RDW: 14.6 % (ref 12.3–15.4)
WBC: 14.4 10*3/uL — ABNORMAL HIGH (ref 3.4–10.8)

## 2017-08-30 LAB — VITAMIN D 25 HYDROXY (VIT D DEFICIENCY, FRACTURES): Vit D, 25-Hydroxy: 32.6 ng/mL (ref 30.0–100.0)

## 2017-08-30 LAB — TSH: TSH: 4.31 u[IU]/mL (ref 0.450–4.500)

## 2017-09-12 ENCOUNTER — Ambulatory Visit: Payer: 59 | Admitting: Family Medicine

## 2017-09-19 ENCOUNTER — Ambulatory Visit (INDEPENDENT_AMBULATORY_CARE_PROVIDER_SITE_OTHER): Payer: 59 | Admitting: Family Medicine

## 2017-09-19 ENCOUNTER — Encounter: Payer: Self-pay | Admitting: Family Medicine

## 2017-09-19 ENCOUNTER — Other Ambulatory Visit: Payer: Self-pay

## 2017-09-19 DIAGNOSIS — Z30432 Encounter for removal of intrauterine contraceptive device: Secondary | ICD-10-CM | POA: Diagnosis not present

## 2017-09-19 DIAGNOSIS — Z124 Encounter for screening for malignant neoplasm of cervix: Secondary | ICD-10-CM | POA: Diagnosis not present

## 2017-09-19 DIAGNOSIS — Z30011 Encounter for initial prescription of contraceptive pills: Secondary | ICD-10-CM

## 2017-09-19 MED ORDER — NORETHINDRONE 0.35 MG PO TABS: 1.0000 | ORAL_TABLET | Freq: Every day | ORAL | 11 refills | Status: DC

## 2017-09-19 MED FILL — NORETHINDRONE 0.35 MG TAB: 0.35 | 28 days supply | Qty: 28 | Fill #0

## 2017-09-19 MED FILL — HYDROCODON-APAP 5-325: 5-325 | 3 days supply | Qty: 12 | Fill #0

## 2017-09-19 MED FILL — AMOXICILLIN 500 MG CAPSULE: 500 | 7 days supply | Qty: 28 | Fill #0

## 2017-09-19 NOTE — Patient Instructions (Addendum)
IF you received an x-ray today, you will receive an invoice from [facility]. Please contact [facility] at [phone] with questions or concerns regarding your invoice.   IF you received labwork today, you will receive an invoice from [place]. Please contact [facility] at [phone] with questions or concerns regarding your invoice.   Our billing staff will not be able to assist you with questions regarding bills from these companies.  You will be contacted with the lab results as soon as they are available. The fastest way to get your results is to activate your My Chart account. Instructions are located on the last page of this paperwork. If you have not heard from [place] regarding the results in 2 weeks, please contact this office.    We recommend that you schedule a mammogram for breast cancer screening. Typically, you do not need a referral to do this. Please contact a local imaging center to schedule your mammogram.  [facility] - [phone]  *ask for the Radiology Department [facility]) - [phone] or [phone]  [facility] - [phone] [facility] - [phone] [facility] - [phone]  *ask for the Radiology Department [facility] - [phone]  *ask for the Radiology Department [facility] - [phone]  *ask for the Mammography Department [facility] - [phone]   From [email or web address] . Last Reviewed: January 06, 2016  . Last Edited: February 28, 2016   Protein Foods Foods high in protein such as fish, chicken, meats, soy products, and cheese, are all called "protein foods." You may also hear them referred to as 'meats or meat substitutes." The biggest difference among foods in this group is how much fat they contain, and for the vegetarian proteins, whether they have carbohydrate.  Protein Choices Plant-Based  Proteins Plant-based protein foods provide quality protein, healthy fats, and fiber. They vary in how much fat and carbohydrate they contain, so make sure to read labels. . Beans such as black, kidney, and pinto  . Bean products like baked beans and refried beans  . Hummus and falafel  . Lentils such as brown, green, or yellow  . Peas such as black-eyed or split peas  . Edamame  . Soy nuts  . Nuts and spreads like almond butter, cashew butter, or peanut butter  . Tempeh, tofu  . Products like meatless "chicken" nuggets, "beef" crumbles, "burgers", "bacon", "sausage", and "hot dogs" Fish and Seafood Try to include fish at least 2 times per week. . Fish high in omega-3 fatty acids like [place] tuna, herring, mackerel, rainbow trout, sardines, and salmon  . Other fish including catfish, cod, flounder, haddock, halibut, orange roughy, and tilapia  . Shellfish including clams, crab, imitation shellfish, lobster, scallops, shrimp, oysters.  Medical illustrator without the skin for less saturated fat and cholesterol. . Chicken, [place], cornish hen .  Cheese and Eggs . Reduced-fat cheese or regular cheese in small amounts  . Cottage cheese  . Whole eggs  Game . Buffalo, ostrich, rabbit, venison  . Dove, duck, goose, or pheasant (no skin)  Beef, Pork, Veal, Lamb It's best to limit your intake of red meat which is often higher in saturated fat and processed meats like ham, bacon and hot dogs which are often higher in saturated fat and sodium. If you decide to have these, choose the leanest options, which are: . Select or Choice grades of beef trimmed of  fat including: chuck, rib, rump roast, round, sirloin, cubed, flank, porterhouse, T-bone steak, tenderloin  . Lamb: chop, leg, or roast  . Veal: loin chop or roast  . Pork: Canadian bacon, center loin chop, ham, tenderloin

## 2017-09-19 NOTE — Progress Notes (Signed)
Chief Complaint  Patient presents with  . Gynecologic Exam    pap 2 week f/u.  pt wants to discuss lab results, wants iud removed and requesting to be placed on bc pill, and cold symptoms x 2 weeks and has tried otc meds without relief    HPI   Patient is here today to get her IUD removed because she "heard bad things about it". 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 Pt reports that she does not eat much food and still cannot lose weight Wt Readings from Last 3 Encounters:  09/19/17 (!) 344 lb 9.6 oz (156.3 kg)  08/29/17 (!) 341 lb (154.7 kg)  11/07/16 (!) 337 lb 12.8 oz (153.2 kg)   She states that she had fruit for breakfast then a salad with greens and shrimp for lunch but did not eat any dinner She typically eats a two meals a day and does not snack She drinks lots of water She does not exercise Her last labs were Lab Results  Component Value Date   HGBA1C 5.6 08/29/2017   Lab Results  Component Value Date   CHOL 152 08/29/2017   HDL 48 08/29/2017   LDLCALC 79 08/29/2017   TRIG 123 08/29/2017   CHOLHDL 3.2 08/29/2017      Past Medical History:  Diagnosis Date  . Aphasia complicating stroke   . Chronic systolic heart failure (HCC) 06/09/2016   a - Prob nonischemic //  Echo 10/17: EF 40-45, diffuse HK, mild LAE, PFO closure device without residual shunt  //  Could not tol cMRI // Echo 4/18: EF 55, normal wall motion, normal diastolic function, trivial MR, PFO device in place without residual shunt, PASP 24  . H/O varicella   . Hemiplegia affecting dominant side (HCC)   . History of ischemic left  MCA stroke   . Increased BMI 12/17/08  . PFO with atrial septal aneurysm 06/04/2014   a - s/p transcatheter closure in 2/16 with 25 mm Amplatzer Cribiform ASO device // b - Echo 10/17:  EF 40-45, diffuse HK, mild LAE, PFO closure device without residual shunt     Current Outpatient Medications  Medication Sig Dispense Refill  . clopidogrel (PLAVIX) 75 MG tablet TAKE ONE TABLET BY MOUTH ONCE DAILY 90 tablet 0  . levonorgestrel (MIRENA, 52 MG,) 20 MCG/24HR IUD Mirena 20 mcg/24 hr (5 years) intrauterine device  Take 1 device by intrauterine route.    . Multiple Vitamins-Minerals (MULTIVITAMIN WITH MINERALS) tablet Take 1 tablet by mouth daily.    . rosuvastatin (CRESTOR) 5 MG tablet TAKE ONE TABLET BY MOUTH ONCE DAILY 30 tablet 2  . norethindrone (ORTHO MICRONOR) 0.35 MG tablet Take 1 tablet (0.35 mg total) by mouth daily. 1 Package 11   No current facility-administered medications for this visit.     Allergies:  Allergies  Allergen Reactions  . No Known Allergies     Past Surgical History:  Procedure Laterality Date  . LAPAROSCOPIC CHOLECYSTECTOMY  06/20/2012  . PATENT FORAMEN OVALE CLOSURE  07/09/2014  .  PATENT FORAMEN OVALE CLOSURE N/A 07/09/2014   Procedure: PATENT FORAMEN OVALE CLOSURE;  Surgeon: Micheline Chapman, MD;  Location: [facility] CATH LAB;  Service: Cardiovascular;  Laterality: N/A;  . RADIOLOGY WITH ANESTHESIA N/A 05/29/2014   Procedure: RADIOLOGY WITH ANESTHESIA;  Surgeon: Oneal Grout, MD;  Location: [facility] OR;  Service: Radiology;  Laterality: N/A;  . TEE WITHOUT CARDIOVERSION N/A 06/03/2014   Procedure: TRANSESOPHAGEAL ECHOCARDIOGRAM (TEE);  Surgeon: Wendall Stade, MD;  Location[facility] ENDOSCOPY;  Service: Cardiovascular;  Laterality: N/A;    Social History   Socioeconomic History  . Marital status: Single    Spouse name: Not on file  . Number of children: 2  . Years of education: Associates  . Highest education level: Not on file  Occupational History  . Not on file    Social Needs  . Financial resource strain: Not on file  . Food insecurity:    Worry: Not on file    Inability: Not on file  . Transportation needs:    Medical: Not on file    Non-medical: Not on file  Tobacco Use  . Smoking status: Never Smoker  . Smokeless tobacco: Never Used  Substance and Sexual Activity  . Alcohol use: No    Alcohol/week: 0.0 oz  . Drug use: No  . Sexual activity: Never  Lifestyle  . Physical activity:    Days per week: Not on file    Minutes per session: Not on file  . Stress: Not on file  Relationships  . Social connections:    Talks on phone: Not on file    Gets together: Not on file    Attends religious service: Not on file    Active member of club or organization: Not on file    Attends meetings of clubs or organizations: Not on file    Relationship status: Not on file  Other Topics Concern  . Not on file  Social History Narrative   Patient is single with 2 children.   Patient is right handed.   Patient has her Associates degree.   Patient drinks 1 soda a month.    Family History  Problem Relation Age of Onset  . Hypertension Mother   . Hypertension Father   . Stroke Father   . Stroke Paternal Grandfather   . Stroke Maternal Grandfather      ROS Review of Systems See HPI Constitution: No fevers or chills No malaise No diaphoresis Skin: No rash or itching Eyes: no blurry vision, no double vision GU: no dysuria or hematuria Neuro: no dizziness or headaches  all others reviewed and negative   Objective: Vitals:   09/19/17 1034  BP: 123/82  Pulse: 82  Resp: 17  Temp: 99.4 F (37.4 C)  TempSrc: Oral  SpO2: 99%  Weight: (!) 344 lb 9.6 oz (156.3 kg)  Height: 5' 11.25" (1.81 m)   Wt Readings from Last 3 Encounters:  09/19/17 (!) 344 lb 9.6 oz (156.3 kg)  08/29/17 (!) 341 lb (154.7 kg)  11/07/16 (!) 337 lb 12.8 oz (153.2 kg)    Physical Exam  Constitutional: She is oriented to person, place, and time. She appears  well-developed and well-nourished.  Morbidly obese female  HENT:  Head: Normocephalic and atraumatic.  Eyes: Conjunctivae and EOM are normal.  Pulmonary/Chest: Effort normal.  Neurological: She is alert and oriented to person, place, and time.   Vaginal exam- Chaperone Present Labia normal bilaterally without skin lesions Urethral meatus normal appearing without erythema Vagina  with whiteout discharge No CMT, ovaries small and not palpable Uterus midline, nontender Pap smear performed  IUD REMOVAL NOTE After verbal consent from patient as well as discussion about other options the mirena iud was removed intact by traction on the string Bimanual exam was performed and uterus was nontender  Assessment and Plan Rebecca Russo was seen today for gynecologic exam.  Diagnoses and all orders for this visit:  Encounter for IUD removal- iud removed without complication  Papanicolaou smear for cervical cancer screening- Reviewed pap smear guidelines- pap smear guidelines reviewed Pap performed -     Pap IG, CT/NG NAA, and HPV (high risk)  OCP (oral contraceptive pills) initiation Education given regarding options for contraception, including barrier methods, injectable contraception, oral contraceptives  Pt decided on OCP. Although her stroke was due to a structural abnormality decided to use a progestin only since she is currently on a progestin only contraception now -     norethindrone (ORTHO MICRONOR) 0.35 MG tablet; Take 1 tablet (0.35 mg total) by mouth daily.  Morbid Obesity Discussed use of myfitnesspal to track calories and food content Discussed insulin and carbohydrate metabolism vs proteins Discouraged the Keto diet Discussed that she should change the fruits to lower glycemic index fruits such as berries Incorporate snacks such as almonds and plan yogurt or vegetables and low fat cheese She should also do meal planning Discussed plant based proteins Also discussed the  importance of food and fuel and not food as reward or emotional attachment to food Goals for next visit: myfitnesspal log  A total of 45 minutes were spent face-to-face with the patient during this encounter and over half of that time was spent on counseling and coordination of care.    Camerin Ladouceur A Kyona Chauncey

## 2017-09-22 LAB — PAP IG, CT-NG NAA, HPV HIGH-RISK
CHLAMYDIA, NUC. ACID AMP: NEGATIVE
Gonococcus by Nucleic Acid Amp: NEGATIVE
HPV, HIGH-RISK: NEGATIVE
PAP SMEAR COMMENT: 0

## 2017-10-11 ENCOUNTER — Telehealth: Payer: Self-pay | Admitting: Family Medicine

## 2017-10-11 NOTE — Telephone Encounter (Signed)
Copied from CRM [id number]. Topic: Quick Communication - See Telephone Encounter >> Oct 11, 2017  2:29 PM Diana Eves B wrote: CRM for notification. See Telephone encounter for: 10/11/17.  Pt would like a different birth control called in. She was prescribed norethindrone (ORTHO MICRONOR) 0.35 MG tablet but hasnt started the medication yet. She would like to be switched to the [name]. If that is not a good fit for her she would like to go back to what she was previously taking llo loestrin. Please send to [facility] - [place], [place] - [address]

## 2017-10-16 ENCOUNTER — Encounter: Payer: Self-pay | Admitting: Family Medicine

## 2017-10-16 MED ORDER — CAMILA 0.35 MG PO TABS: 1.0000 | ORAL_TABLET | Freq: Every day | ORAL | 11 refills | Status: DC

## 2017-10-16 MED FILL — NORETHINDRONE 0.35 MG TAB: 0.35 | 28 days supply | Qty: 28 | Fill #0

## 2017-10-16 NOTE — Addendum Note (Signed)
Addended by: Collie Siad A on: 10/16/2017 09:22 AM   Modules accepted: Orders

## 2017-10-16 NOTE — Telephone Encounter (Signed)
Rebecca Russo sent in. Pt had a stroke due to PFO which has been repair.  She did not have a coagulopathy or blood pressure related cause.

## 2017-10-22 ENCOUNTER — Encounter: Payer: Self-pay | Admitting: Family Medicine

## 2017-10-22 ENCOUNTER — Ambulatory Visit (INDEPENDENT_AMBULATORY_CARE_PROVIDER_SITE_OTHER): Payer: 59 | Admitting: Family Medicine

## 2017-10-22 ENCOUNTER — Other Ambulatory Visit: Payer: Self-pay

## 2017-10-22 VITALS — BP 118/86 | HR 86 | Temp 99.1°F | Ht 71.25 in | Wt 355.0 lb

## 2017-10-22 DIAGNOSIS — J310 Chronic rhinitis: Secondary | ICD-10-CM | POA: Diagnosis not present

## 2017-10-22 DIAGNOSIS — J3089 Other allergic rhinitis: Secondary | ICD-10-CM | POA: Diagnosis not present

## 2017-10-22 MED ORDER — LEVOCETIRIZINE DIHYDROCHLORIDE 5 MG PO TABS: 5.0000 mg | ORAL_TABLET | Freq: Every evening | ORAL | 0 refills | Status: DC

## 2017-10-22 MED FILL — LEVOCETIRIZINE 5 MG TABLET: 5 | 30 days supply | Qty: 30 | Fill #0

## 2017-10-22 NOTE — Patient Instructions (Signed)
     IF you received an x-ray today, you will receive an invoice from [facility]. Please contact [facility] at [phone] with questions or concerns regarding your invoice.   IF you received labwork today, you will receive an invoice from [facility]. Please contact [facility] at [phone] with questions or concerns regarding your invoice.   Our billing staff will not be able to assist you with questions regarding bills from these companies.  You will be contacted with the lab results as soon as they are available. The fastest way to get your results is to activate your My Chart account. Instructions are located on the last page of this paperwork. If you have not heard from us regarding the results in 2 weeks, please contact this office.     

## 2017-10-22 NOTE — Progress Notes (Signed)
Chief Complaint  Patient presents with  . Follow-up    follow up on pap and obesity. Has appt with health and wellness Dr. Dalbert Garnet. Medication given for allergies is not working    HPI   URI She reports that she is very congested with postnasal drip She does not cough She is not sleeping  She has a tickle in her throat flonase and zyrtec and mucinex She has had symptoms for the past 3 months   Morbid Obesity She is eating a healthy diet and exercising 3-4 times a week She reports that she is still gaining weight Her lunch today was asparagus, zucchini and grilled chicken breast strips with soy sauce as seasoning Has appt with Dr. Malena Edman this month IUD was discontinued She resumed menses Patient's last menstrual period was 10/08/2017.   Past Medical History:  Diagnosis Date  . Aphasia complicating stroke   . Chronic systolic heart failure (HCC) 06/09/2016   a - Prob nonischemic //  Echo 10/17: EF 40-45, diffuse HK, mild LAE, PFO closure device without residual shunt  //  Could not tol cMRI // Echo 4/18: EF 55, normal wall motion, normal diastolic function, trivial MR, PFO device in place without residual shunt, PASP 24  . H/O varicella   . Hemiplegia affecting dominant side (HCC)   . History of ischemic left MCA stroke   . Increased BMI 12/17/08  . PFO with atrial septal aneurysm 06/04/2014   a - s/p transcatheter closure in 2/16 with 25 mm Amplatzer Cribiform ASO device // b - Echo 10/17:  EF 40-45, diffuse HK, mild LAE, PFO closure device without residual shunt     Current Outpatient Medications  Medication Sig Dispense Refill  . [name] 0.35 MG tablet Take 1 tablet (0.35 mg total) by mouth daily. 1 Package 11  . clopidogrel (PLAVIX) 75 MG tablet TAKE ONE TABLET BY MOUTH ONCE DAILY 90 tablet 0  . levocetirizine (XYZAL ALLERGY 24HR) 5 MG tablet Take 1 tablet (5 mg total) by mouth every evening. 30 tablet 0  . Multiple Vitamins-Minerals (MULTIVITAMIN WITH MINERALS) tablet  Take 1 tablet by mouth daily.    . rosuvastatin (CRESTOR) 5 MG tablet TAKE ONE TABLET BY MOUTH ONCE DAILY 30 tablet 2   No current facility-administered medications for this visit.     Allergies:  Allergies  Allergen Reactions  . No Known Allergies     Past Surgical History:  Procedure Laterality Date  . LAPAROSCOPIC CHOLECYSTECTOMY  06/20/2012  . PATENT FORAMEN OVALE CLOSURE  07/09/2014  . PATENT FORAMEN OVALE CLOSURE N/A 07/09/2014   Procedure: PATENT FORAMEN OVALE CLOSURE;  Surgeon: Micheline Chapman, MD;  Location: [facility] CATH LAB;  Service: Cardiovascular;  Laterality: N/A;  . RADIOLOGY WITH ANESTHESIA N/A 05/29/2014   Procedure: RADIOLOGY WITH ANESTHESIA;  Surgeon: Oneal Grout, MD;  Location: [facility] OR;  Service: Radiology;  Laterality: N/A;  . TEE WITHOUT CARDIOVERSION N/A 06/03/2014   Procedure: TRANSESOPHAGEAL ECHOCARDIOGRAM (TEE);  Surgeon: Wendall Stade, MD;  Location: [facility] ENDOSCOPY;  Service: Cardiovascular;  Laterality: N/A;    Social History   Socioeconomic History  . Marital status: Single    Spouse name: Not on file  . Number of children: 2  . Years of education: Associates  . Highest education level: Not on file  Occupational History  . Not on file  Social Needs  . Financial resource strain: Not on file  . Food insecurity:    Worry: Not on file    Inability: Not  on file  . Transportation needs:    Medical: Not on file    Non-medical: Not on file  Tobacco Use  . Smoking status: Never Smoker  . Smokeless tobacco: Never Used  Substance and Sexual Activity  . Alcohol use: No    Alcohol/week: 0.0 oz  . Drug use: No  . Sexual activity: Not Currently  Lifestyle  . Physical activity:    Days per week: Not on file    Minutes per session: Not on file  . Stress: Not on file  Relationships  . Social connections:    Talks on phone: Not on file    Gets together: Not on file    Attends religious service: Not on file    Active member of club or organization: Not  on file    Attends meetings of clubs or organizations: Not on file    Relationship status: Not on file  Other Topics Concern  . Not on file  Social History Narrative   Patient is single with 2 children.   Patient is right handed.   Patient has her Associates degree.   Patient drinks 1 soda a month.    Family History  Problem Relation Age of Onset  . Hypertension Mother   . Hypertension Father   . Stroke Father   . Stroke Paternal Grandfather   . Stroke Maternal Grandfather      ROS Review of Systems See HPI Constitution: No fevers or chills No malaise No diaphoresis Skin: No rash or itching Eyes: no blurry vision, no double vision GU: no dysuria or hematuria Neuro: no dizziness or headaches  all others reviewed and negative   Objective: Vitals:   10/22/17 1550  BP: 118/86  Pulse: 86  Temp: 99.1 F (37.3 C)  TempSrc: Oral  SpO2: 99%  Weight: (!) 355 lb (161 kg)  Height: 5' 11.25" (1.81 m)    Physical Exam  Constitutional: She appears well-developed and well-nourished.  HENT:  Head: Normocephalic and atraumatic.   General: alert, oriented, in NAD Head: normocephalic, atraumatic, no sinus tenderness Eyes: EOM intact, no scleral icterus or conjunctival injection Ears: TM clear bilaterally Nose: mucosa nonerythematous, nonedematous Throat: no pharyngeal exudate or erythema Lymph: no posterior auricular, submental or cervical lymph adenopathy Heart: normal rate, normal sinus rhythm, no murmurs Lungs: clear to auscultation bilaterally, no wheezing   Assessment and Plan Manasa was seen today for follow-up.  Diagnoses and all orders for this visit:  Non-seasonal allergic rhinitis due to other allergic trigger-  Continue supportive care Viral most likely  Normal HEENT and pulm exam -     Discontinue: levocetirizine (XYZAL ALLERGY 24HR) 5 MG tablet; Take 1 tablet (5 mg total) by mouth every evening. -     Ambulatory referral to Allergy -      levocetirizine (XYZAL ALLERGY 24HR) 5 MG tablet; Take 1 tablet (5 mg total) by mouth every evening.  Chronic rhinitis -     Ambulatory referral to Allergy  Morbid obesity (HCC)-  Concern raised about fluid retention Advised limiting salty msg sauces Continue diet     Rana Adorno A Katie Moch

## 2017-10-25 ENCOUNTER — Encounter (INDEPENDENT_AMBULATORY_CARE_PROVIDER_SITE_OTHER): Payer: 59

## 2017-11-05 ENCOUNTER — Ambulatory Visit (INDEPENDENT_AMBULATORY_CARE_PROVIDER_SITE_OTHER): Payer: 59 | Admitting: Family Medicine

## 2017-11-06 ENCOUNTER — Ambulatory Visit (INDEPENDENT_AMBULATORY_CARE_PROVIDER_SITE_OTHER): Payer: 59 | Admitting: Family Medicine

## 2017-11-06 ENCOUNTER — Encounter: Payer: Self-pay | Admitting: Family Medicine

## 2017-11-06 DIAGNOSIS — Z6841 Body Mass Index (BMI) 40.0 and over, adult: Secondary | ICD-10-CM

## 2017-11-08 ENCOUNTER — Ambulatory Visit (INDEPENDENT_AMBULATORY_CARE_PROVIDER_SITE_OTHER): Payer: 59 | Admitting: Family Medicine

## 2017-11-08 ENCOUNTER — Encounter (INDEPENDENT_AMBULATORY_CARE_PROVIDER_SITE_OTHER): Payer: Self-pay | Admitting: Family Medicine

## 2017-11-08 VITALS — BP 107/72 | HR 61 | Temp 98.1°F | Ht 70.0 in | Wt 352.0 lb

## 2017-11-08 DIAGNOSIS — Z1331 Encounter for screening for depression: Secondary | ICD-10-CM | POA: Diagnosis not present

## 2017-11-08 DIAGNOSIS — R0602 Shortness of breath: Secondary | ICD-10-CM

## 2017-11-08 DIAGNOSIS — R5383 Other fatigue: Secondary | ICD-10-CM | POA: Diagnosis not present

## 2017-11-08 DIAGNOSIS — Z8673 Personal history of transient ischemic attack (TIA), and cerebral infarction without residual deficits: Secondary | ICD-10-CM

## 2017-11-08 DIAGNOSIS — Z6841 Body Mass Index (BMI) 40.0 and over, adult: Secondary | ICD-10-CM

## 2017-11-08 DIAGNOSIS — Z9189 Other specified personal risk factors, not elsewhere classified: Secondary | ICD-10-CM

## 2017-11-08 DIAGNOSIS — Z0289 Encounter for other administrative examinations: Secondary | ICD-10-CM

## 2017-11-08 NOTE — Progress Notes (Signed)
Office: [phone]  /  Fax: [phone]   Dear Dr. Creta Levin,   Thank you for referring Rebecca Russo to our clinic. The following note includes my evaluation and treatment recommendations.  HPI:   Chief Complaint: OBESITY    Rebecca Russo has been referred by Zoe A. Creta Levin, MD for consultation regarding her obesity and obesity related comorbidities.    Rebecca Russo (MR# [id number]) is a 42 y.o. female who presents on 11/08/2017 for obesity evaluation and treatment. Current BMI is Body mass index is 50.51 kg/m.Rebecca Russo has been struggling with her weight for many years and has been unsuccessful in either losing weight, maintaining weight loss, or reaching her healthy weight goal.     Rebecca Russo attended our information session and states she is currently in the action stage of change and ready to dedicate time achieving and maintaining a healthier weight. Rebecca Russo is interested in becoming our patient and working on intensive lifestyle modifications including (but not limited to) diet, exercise and weight loss.    Rebecca Russo states her family eats meals together she thinks her family will eat healthier with  her her desired weight loss is 102 lbs she has been heavy most of  her life she started gaining weight at 22 to 42 yrs old her heaviest weight ever was 334 lbs. she is a picky eater and doesn't like to eat healthier foods  she has significant food cravings issues  she snacks frequently in the evenings she skips meals frequently she is frequently drinking liquids with calories she frequently makes poor food choices she has problems with excessive hunger  she has binge eating behaviors she struggles with emotional eating    Fatigue [name] feels her energy is lower than it should be. This has worsened with weight gain and has not worsened recently. Rebecca Russo admits to daytime somnolence and admits to waking up still tired. Patient is at risk for obstructive sleep apnea. Patent has a  history of symptoms of daytime fatigue and morning fatigue. Patient generally gets 8 or 9 hours of sleep per night, and states they generally have  restful sleep. Snoring is not present. Apneic episodes are not present. Epworth Sleepiness Score is 3  Dyspnea on exertion Rhyli notes increasing shortness of breath with exercising and seems to be worsening over time with weight gain. She notes getting out of breath sooner with activity than she used to. This has not gotten worse recently. Rebecca Russo denies orthopnea.  Status Post CVA (cerebral vascular accident) Rebecca Russo is status post CVA and is on Plavix. She has a history of repaired PFO. Rebecca Russo denies chest pain or palpitations.  At risk for cardiovascular disease Rebecca Russo is at a higher than average risk for cardiovascular disease due to obesity and previous CVA. She currently denies any chest pain.  Depression Screen Rebecca Russo's Food and Mood (modified PHQ-9) score was  Depression screen PHQ 2/9 11/08/2017  Decreased Interest 2  Down, Depressed, Hopeless 0  PHQ - 2 Score 2  Altered sleeping 0  Tired, decreased energy 2  Change in appetite 2  Feeling bad or failure about yourself  1  Trouble concentrating 1  Moving slowly or fidgety/restless 0  Suicidal thoughts 0  PHQ-9 Score 8    ALLERGIES: Allergies  Allergen Reactions  . No Known Allergies     MEDICATIONS: Current Outpatient Medications on File Prior to Visit  Medication Sig Dispense Refill  . [name] 0.35 MG tablet Take 1 tablet (0.35 mg total) by mouth daily.  1 Package 11  . clopidogrel (PLAVIX) 75 MG tablet TAKE ONE TABLET BY MOUTH ONCE DAILY 90 tablet 0  . levocetirizine (XYZAL ALLERGY 24HR) 5 MG tablet Take 1 tablet (5 mg total) by mouth every evening. 30 tablet 0  . rosuvastatin (CRESTOR) 5 MG tablet TAKE ONE TABLET BY MOUTH ONCE DAILY 30 tablet 2   No current facility-administered medications on file prior to visit.     PAST MEDICAL HISTORY: Past Medical History:    Diagnosis Date  . Aphasia complicating stroke   . Bilateral swelling of feet   . Chronic systolic heart failure (HCC) 06/09/2016   a - Prob nonischemic //  Echo 10/17: EF 40-45, diffuse HK, mild LAE, PFO closure device without residual shunt  //  Could not tol cMRI // Echo 4/18: EF 55, normal wall motion, normal diastolic function, trivial MR, PFO device in place without residual shunt, PASP 24  . Constipation   . H/O varicella   . Hemiplegia affecting dominant side (HCC)   . History of ischemic left MCA stroke   . Increased BMI 12/17/08  . PFO with atrial septal aneurysm 06/04/2014   a - s/p transcatheter closure in 2/16 with 25 mm Amplatzer Cribiform ASO device // b - Echo 10/17:  EF 40-45, diffuse HK, mild LAE, PFO closure device without residual shunt   . Stroke (cerebrum) (HCC)     PAST SURGICAL HISTORY: Past Surgical History:  Procedure Laterality Date  . LAPAROSCOPIC CHOLECYSTECTOMY  06/20/2012  . PATENT FORAMEN OVALE CLOSURE  07/09/2014  . PATENT FORAMEN OVALE CLOSURE N/A 07/09/2014   Procedure: PATENT FORAMEN OVALE CLOSURE;  Surgeon: Micheline Chapman, MD;  Location: [facility] CATH LAB;  Service: Cardiovascular;  Laterality: N/A;  . RADIOLOGY WITH ANESTHESIA N/A 05/29/2014   Procedure: RADIOLOGY WITH ANESTHESIA;  Surgeon: Oneal Grout, MD;  Location: [facility] OR;  Service: Radiology;  Laterality: N/A;  . TEE WITHOUT CARDIOVERSION N/A 06/03/2014   Procedure: TRANSESOPHAGEAL ECHOCARDIOGRAM (TEE);  Surgeon: Wendall Stade, MD;  Location: [facility] ENDOSCOPY;  Service: Cardiovascular;  Laterality: N/A;    SOCIAL HISTORY: Social History   Tobacco Use  . Smoking status: Never Smoker  . Smokeless tobacco: Never Used  Substance Use Topics  . Alcohol use: No    Alcohol/week: 0.0 oz  . Drug use: No    FAMILY HISTORY: Family History  Problem Relation Age of Onset  . Hypertension Mother   . Diabetes Mother   . Heart disease Mother   . Sleep apnea Mother   . Hypertension Father   . Stroke  Father   . Diabetes Father   . Heart disease Father   . Stroke Paternal Grandfather   . Stroke Maternal Grandfather     ROS: Review of Systems  Constitutional: Positive for malaise/fatigue.  HENT: Positive for congestion (nasal stuffiness).   Eyes:       Vision Changes  Respiratory: Positive for shortness of breath (on exertion).   Cardiovascular: Negative for chest pain, palpitations and orthopnea.    PHYSICAL EXAM: Blood pressure 107/72, pulse 61, temperature 98.1 F (36.7 C), height 5\' 10"  (1.778 m), weight (!) 352 lb (159.7 kg), SpO2 100 %. Body mass index is 50.51 kg/m. Physical Exam  Constitutional: She is oriented to person, place, and time. She appears well-developed and well-nourished.  HENT:  Head: Normocephalic.  Nose: Nose normal.  Eyes: EOM are normal. No scleral icterus.  Neck: Normal range of motion. Neck supple. No thyromegaly present.  Cardiovascular: Normal rate and  regular rhythm.  Pulmonary/Chest: Effort normal. No respiratory distress.  Abdominal: Soft. There is no tenderness.  + obesity  Musculoskeletal: Normal range of motion.  Range of Motion is normal in all 4 extremities  Neurological: She is alert and oriented to person, place, and time. Coordination normal.  Skin: Skin is warm and dry.  Psychiatric: She has a normal mood and affect. Her behavior is normal.  Vitals reviewed.   RECENT LABS AND TESTS: BMET    Component Value Date/Time   NA 139 08/29/2017 1500   K 4.6 08/29/2017 1500   CL 104 08/29/2017 1500   CO2 23 08/29/2017 1500   GLUCOSE 71 08/29/2017 1500   GLUCOSE 87 07/10/2014 0400   BUN 8 08/29/2017 1500   CREATININE 0.78 08/29/2017 1500   CALCIUM 9.4 08/29/2017 1500   GFRNONAA 94 08/29/2017 1500   GFRAA 108 08/29/2017 1500   Lab Results  Component Value Date   HGBA1C 5.6 08/29/2017   No results found for: INSULIN CBC    Component Value Date/Time   WBC 14.4 (H) 08/29/2017 1500   WBC 10.6 (H) 07/10/2014 0400   RBC 3.98  08/29/2017 1500   RBC 3.30 (L) 07/10/2014 0400   HGB 12.2 08/29/2017 1500   HCT 36.7 08/29/2017 1500   PLT 417 (H) 08/29/2017 1500   MCV 92 08/29/2017 1500   MCH 30.7 08/29/2017 1500   MCH 29.7 07/10/2014 0400   MCHC 33.2 08/29/2017 1500   MCHC 32.6 07/10/2014 0400   RDW 14.6 08/29/2017 1500   LYMPHSABS 3.7 06/01/2014 0552   MONOABS 0.9 06/01/2014 0552   EOSABS 0.4 06/01/2014 0552   BASOSABS 0.0 06/01/2014 0552   Iron/TIBC/Ferritin/ %Sat No results found for: IRON, TIBC, FERRITIN, IRONPCTSAT Lipid Panel     Component Value Date/Time   CHOL 152 08/29/2017 1500   TRIG 123 08/29/2017 1500   HDL 48 08/29/2017 1500   CHOLHDL 3.2 08/29/2017 1500   CHOLHDL 5.6 05/30/2014 1200   VLDL 45 (H) 05/30/2014 1200   LDLCALC 79 08/29/2017 1500   Hepatic Function Panel     Component Value Date/Time   PROT 7.6 08/29/2017 1500   ALBUMIN 4.3 08/29/2017 1500   AST 22 08/29/2017 1500   ALT 18 08/29/2017 1500   ALKPHOS 85 08/29/2017 1500   BILITOT 0.5 08/29/2017 1500   BILIDIR 0.08 09/06/2016 1322      Component Value Date/Time   TSH 4.310 08/29/2017 1500   Vitamin D Results for AMAREA, MACDOWELL (MRN [id number]) as of 11/08/2017 10:22  Ref. Range 08/29/2017 15:00  Vitamin D, 25-Hydroxy Latest Ref Range: 30.0 - 100.0 ng/mL 32.6    ECG  shows NSR with a rate of 66 BPM INDIRECT CALORIMETER done today shows a VO2 of 211 and a REE of 1468.  Her calculated basal metabolic rate is 9562 thus her basal metabolic rate is worse than expected.    ASSESSMENT AND PLAN: Other fatigue - Plan: EKG 12-Lead, Comprehensive metabolic panel, Hemoglobin A1c, Insulin, random, VITAMIN D 25 Hydroxy (Vit-D Deficiency, Fractures), Vitamin B12, Folate, T3, T4, free, TSH  Shortness of breath on exertion  Status post CVA  Depression screening  At risk for heart disease  Class 3 severe obesity with serious comorbidity and body mass index (BMI) of 50.0 to 59.9 in adult, unspecified obesity type  (HCC)  PLAN: Fatigue Helaina was informed that her fatigue may be related to obesity, depression or many other causes. Labs will be ordered, and in the meanwhile Foster has agreed to  work on diet, exercise and weight loss to help with fatigue. Proper sleep hygiene was discussed including the need for 7-8 hours of quality sleep each night. A sleep study was not ordered based on symptoms and Epworth score.  Dyspnea on exertion Kinberly's shortness of breath appears to be obesity related and exercise induced. She has agreed to work on weight loss and gradually increase exercise to treat her exercise induced shortness of breath. If Jazlin follows our instructions and loses weight without improvement of her shortness of breath, we will plan to refer to pulmonology. We will monitor this condition regularly. Jamyra agrees to this plan.  Status Post CVA (cerebral vascular accident) Kadeidra was advised that diet, exercise and weight loss would help decrease the risk of another CVA or MI. She was also advised that phentermine was no longer an option for weight loss, but other medications might be if needed.  Cardiovascular risk counseling Shayley was given extended (15 minutes) coronary artery disease prevention counseling today. She is 42 y.o. female and has risk factors for heart disease including obesity and previous CVA. We discussed intensive lifestyle modifications today with an emphasis on specific weight loss instructions and strategies. Pt was also informed of the importance of increasing exercise and decreasing saturated fats to help prevent heart disease.  Depression Screen Jazzlin had a mildly positive depression screening. Depression is commonly associated with obesity and often results in emotional eating behaviors. We will monitor this closely and work on CBT to help improve the non-hunger eating patterns. Referral to Psychology may be required if no improvement is seen as she continues in our  clinic.  Obesity Doraine is currently in the action stage of change and her goal is to continue with weight loss efforts. I recommend Kalkidan begin the structured treatment plan as follows:  She has agreed to follow the Category 2 plan Adisyn has been instructed to eventually work up to a goal of 150 minutes of combined cardio and strengthening exercise per week for weight loss and overall health benefits. We discussed the following Behavioral Modification Strategies today: increasing lean protein intake, decreasing simple carbohydrates  and work on meal planning and easy cooking plans   She was informed of the importance of frequent follow up visits to maximize her success with intensive lifestyle modifications for her multiple health conditions. She was informed we would discuss her lab results at her next visit unless there is a critical issue that needs to be addressed sooner. Tenessa agreed to keep her next visit at the agreed upon time to discuss these results.    OBESITY BEHAVIORAL INTERVENTION VISIT  Today's visit was # 1 out of 22.  Starting weight: 352 lbs Starting date: 11/08/17 Today's weight : 352 lbs Today's date: 11/08/2017 Total lbs lost to date: 0 (Patients must lose 7 lbs in the first 6 months to continue with counseling)   ASK: We discussed the diagnosis of obesity with Jessieca L Mayer Camel today and Kairee agreed to give [place] permission to discuss obesity behavioral modification therapy today.  ASSESS: Roisin has the diagnosis of obesity and her BMI today is 50.51 Vianne is in the action stage of change   ADVISE: Najla was educated on the multiple health risks of obesity as well as the benefit of weight loss to improve her health. She was advised of the need for long term treatment and the importance of lifestyle modifications.  AGREE: Multiple dietary modification options and treatment options were discussed and  Hendy agreed  to the above obesity treatment plan.   I,  Nev[place] Crane, am acting as transcriptionist for  Quillian Quince, MD  I have reviewed the above documentation for accuracy and completeness, and I agree with the above. -Quillian Quince, MD

## 2017-11-09 LAB — INSULIN, RANDOM: INSULIN: 18.8 u[IU]/mL (ref 2.6–24.9)

## 2017-11-09 LAB — T3: T3 TOTAL: 134 ng/dL (ref 71–180)

## 2017-11-09 LAB — VITAMIN B12: VITAMIN B 12: 1133 pg/mL (ref 232–1245)

## 2017-11-09 LAB — TSH: TSH: 3.88 u[IU]/mL (ref 0.450–4.500)

## 2017-11-09 LAB — COMPREHENSIVE METABOLIC PANEL
ALBUMIN: 4.3 g/dL (ref 3.5–5.5)
ALK PHOS: 85 IU/L (ref 39–117)
ALT: 16 IU/L (ref 0–32)
AST: 18 IU/L (ref 0–40)
Albumin/Globulin Ratio: 1.3 (ref 1.2–2.2)
BILIRUBIN TOTAL: 0.3 mg/dL (ref 0.0–1.2)
BUN / CREAT RATIO: 15 (ref 9–23)
BUN: 12 mg/dL (ref 6–24)
CO2: 19 mmol/L — AB (ref 20–29)
CREATININE: 0.81 mg/dL (ref 0.57–1.00)
Calcium: 9.5 mg/dL (ref 8.7–10.2)
Chloride: 105 mmol/L (ref 96–106)
GFR calc Af Amer: 104 mL/min/{1.73_m2} (ref >59)
GFR calc non Af Amer: 90 mL/min/{1.73_m2} (ref >59)
GLUCOSE: 83 mg/dL (ref 65–99)
Globulin, Total: 3.4 g/dL (ref 1.5–4.5)
Potassium: 4.6 mmol/L (ref 3.5–5.2)
SODIUM: 140 mmol/L (ref 134–144)
Total Protein: 7.7 g/dL (ref 6.0–8.5)

## 2017-11-09 LAB — HEMOGLOBIN A1C
ESTIMATED AVERAGE GLUCOSE: 108 mg/dL
HEMOGLOBIN A1C: 5.4 % (ref 4.8–5.6)

## 2017-11-09 LAB — VITAMIN D 25 HYDROXY (VIT D DEFICIENCY, FRACTURES): VIT D 25 HYDROXY: 30.2 ng/mL (ref 30.0–100.0)

## 2017-11-09 LAB — T4, FREE: Free T4: 1.1 ng/dL (ref 0.82–1.77)

## 2017-11-09 LAB — FOLATE: FOLATE: 14.9 ng/mL (ref >3.0)

## 2017-11-16 ENCOUNTER — Other Ambulatory Visit: Payer: Self-pay | Admitting: Cardiovascular Disease

## 2017-11-16 DIAGNOSIS — I428 Other cardiomyopathies: Secondary | ICD-10-CM

## 2017-11-16 DIAGNOSIS — Q2112 Patent foramen ovale: Secondary | ICD-10-CM

## 2017-11-16 DIAGNOSIS — E785 Hyperlipidemia, unspecified: Secondary | ICD-10-CM

## 2017-11-16 DIAGNOSIS — Q211 Atrial septal defect: Secondary | ICD-10-CM

## 2017-11-16 MED ORDER — CLOPIDOGREL BISULFATE 75 MG PO TABS
75.0000 mg | ORAL_TABLET | Freq: Every day | ORAL | 0 refills | Status: DC
Start: 2017-11-16 — End: 2018-07-30

## 2017-11-16 MED FILL — CAMILA 0.35 MG TABS: 0.35 | 28 days supply | Qty: 28 | Fill #1

## 2017-11-16 MED FILL — ROSUVASTATIN CALCIUM 5 MG T: 5 | 30 days supply | Qty: 30 | Fill #0

## 2017-11-16 MED FILL — CLOPIDOGREL 75 MG TABLET: 75 | 30 days supply | Qty: 30 | Fill #0

## 2017-11-22 ENCOUNTER — Ambulatory Visit (INDEPENDENT_AMBULATORY_CARE_PROVIDER_SITE_OTHER): Payer: 59 | Admitting: Family Medicine

## 2017-11-22 VITALS — BP 123/68 | HR 72 | Temp 98.3°F | Ht 70.0 in | Wt 353.0 lb

## 2017-11-22 DIAGNOSIS — Z9189 Other specified personal risk factors, not elsewhere classified: Secondary | ICD-10-CM | POA: Diagnosis not present

## 2017-11-22 DIAGNOSIS — E559 Vitamin D deficiency, unspecified: Secondary | ICD-10-CM | POA: Diagnosis not present

## 2017-11-22 DIAGNOSIS — Z6841 Body Mass Index (BMI) 40.0 and over, adult: Secondary | ICD-10-CM

## 2017-11-22 DIAGNOSIS — E8881 Metabolic syndrome: Secondary | ICD-10-CM | POA: Diagnosis not present

## 2017-11-22 MED ORDER — VITAMIN D (ERGOCALCIFEROL) 1.25 MG (50000 UNIT) PO CAPS: 50000.0000 [IU] | ORAL_CAPSULE | ORAL | 0 refills | Status: DC

## 2017-11-22 MED FILL — VIT D2 1.25 MG (50,000 UNIT: 1.25 MG | 28 days supply | Qty: 4 | Fill #0

## 2017-11-22 NOTE — Progress Notes (Signed)
Office: [phone]  /  Fax: [phone]   HPI:   Chief Complaint: OBESITY Rebecca Russo is here to discuss her progress with her obesity treatment plan. She is on the Category 2 plan and is following her eating plan approximately 90 to 95 % of the time. She states she is walking on the treadmill, biking and doing elliptical and weights for her arms  90 minutes 5 times per week. [name] states she struggled to eat all her food on her category 2 plan. Her RMR was calculated to be 2300 calories per day and her eating plan was 1200 calories per day, which suggests extra calories snuck into her daily diet for the last two weeks. She is still considering weight loss surgery, but she hasn't yet decided. Her weight is (!) 353 lb (160.1 kg) today and has had a weight gain of 1 pound over a period of 2 weeks since her last visit. She has gained 1 lb since starting treatment with [place].  Vitamin D deficiency (new) Rebecca Russo has a new diagnosis of vitamin D deficiency. She is not currently taking vit D. Emara admits fatigue and denies nausea, vomiting or muscle weakness.  Insulin Resistance (new) Rebecca Russo has a new diagnosis of insulin. Lillion has elevated fasting insulin and acanthosis nigricans. She has hyperlipidemia and her waist is >35 inches, all pointing to insulin resistance. She denies polyphagia currently.. Although Rebecca Russo's blood glucose readings are still under good control, insulin resistance puts her at greater risk of metabolic syndrome and diabetes. She is not taking metformin currently and she continues to work on diet and exercise to decrease risk of diabetes.  At risk for diabetes Rebecca Russo is at higher than average risk for developing diabetes due to her obesity and insulin resistance. She currently denies polyuria or polydipsia.  ALLERGIES: Allergies  Allergen Reactions  . No Known Allergies     MEDICATIONS: Current Outpatient Medications on File Prior to Visit  Medication Sig Dispense  Refill  . [name] 0.35 MG tablet Take 1 tablet (0.35 mg total) by mouth daily. 1 Package 11  . clopidogrel (PLAVIX) 75 MG tablet Take 1 tablet (75 mg total) by mouth daily. 30 tablet 0  . levocetirizine (XYZAL ALLERGY 24HR) 5 MG tablet Take 1 tablet (5 mg total) by mouth every evening. 30 tablet 0  . rosuvastatin (CRESTOR) 5 MG tablet Take 1 tablet (5 mg total) by mouth daily. Please call and make an appt for further refills. 1st attempt 30 tablet 0   No current facility-administered medications on file prior to visit.     PAST MEDICAL HISTORY: Past Medical History:  Diagnosis Date  . Aphasia complicating stroke   . Bilateral swelling of feet   . Chronic systolic heart failure (HCC) 06/09/2016   a - Prob nonischemic //  Echo 10/17: EF 40-45, diffuse HK, mild LAE, PFO closure device without residual shunt  //  Could not tol cMRI // Echo 4/18: EF 55, normal wall motion, normal diastolic function, trivial MR, PFO device in place without residual shunt, PASP 24  . Constipation   . H/O varicella   . Hemiplegia affecting dominant side (HCC)   . History of ischemic left MCA stroke   . Increased BMI 12/17/08  . PFO with atrial septal aneurysm 06/04/2014   a - s/p transcatheter closure in 2/16 with 25 mm Amplatzer Cribiform ASO device // b - Echo 10/17:  EF 40-45, diffuse HK, mild LAE, PFO closure device without residual shunt   . Stroke (  cerebrum) (HCC)     PAST SURGICAL HISTORY: Past Surgical History:  Procedure Laterality Date  . LAPAROSCOPIC CHOLECYSTECTOMY  06/20/2012  . PATENT FORAMEN OVALE CLOSURE  07/09/2014  . PATENT FORAMEN OVALE CLOSURE N/A 07/09/2014   Procedure: PATENT FORAMEN OVALE CLOSURE;  Surgeon: Micheline Chapman, MD;  Location[facility] C[facility] LAB;  Service: Cardiovascular;  Laterality: N/A;  . RADIOLOGY WITH ANESTHESIA N/A 05/29/2014   Procedure: RADIOLOGY WITH ANESTHESIA;  Surgeon: Oneal Grout, MD;  Location: [facility] OR;  Service: Radiology;  Laterality: N/A;  . TEE WITHOUT  CARDIOVERSION N/A 06/03/2014   Procedure: TRANSESOPHAGEAL ECHOCARDIOGRAM (TEE);  Surgeon: Wendall Stade, MD;  Location: [facility] ENDOSCOPY;  Service: Cardiovascular;  Laterality: N/A;    SOCIAL HISTORY: Social History   Tobacco Use  . Smoking status: Never Smoker  . Smokeless tobacco: Never Used  Substance Use Topics  . Alcohol use: No    Alcohol/week: 0.0 oz  . Drug use: No    FAMILY HISTORY: Family History  Problem Relation Age of Onset  . Hypertension Mother   . Diabetes Mother   . Heart disease Mother   . Sleep apnea Mother   . Hypertension Father   . Stroke Father   . Diabetes Father   . Heart disease Father   . Stroke Paternal Grandfather   . Stroke Maternal Grandfather     ROS: Review of Systems  Constitutional: Negative for weight loss.  Gastrointestinal: Negative for nausea and vomiting.  Genitourinary: Negative for frequency.  Musculoskeletal:       Negative for muscle weakness  Endo/Heme/Allergies: Negative for polydipsia.       Negative for polyphagia    PHYSICAL EXAM: Blood pressure 123/68, pulse 72, temperature 98.3 F (36.8 C), temperature source Oral, height 5\' 10"  (1.778 m), weight (!) 353 lb (160.1 kg), last menstrual period 11/05/2017, SpO2 97 %. Body mass index is 50.65 kg/m. Physical Exam  Constitutional: She is oriented to person, place, and time. She appears well-developed and well-nourished.  Cardiovascular: Normal rate.  Pulmonary/Chest: Effort normal.  Musculoskeletal: Normal range of motion.  Neurological: She is oriented to person, place, and time.  Skin: Skin is warm and dry.  Positive for acanthosis nigricans  Psychiatric: She has a normal mood and affect. Her behavior is normal.  Vitals reviewed.   RECENT LABS AND TESTS: BMET    Component Value Date/Time   NA 140 11/08/2017 0922   K 4.6 11/08/2017 0922   CL 105 11/08/2017 0922   CO2 19 (L) 11/08/2017 0922   GLUCOSE 83 11/08/2017 0922   GLUCOSE 87 07/10/2014 0400   BUN 12  11/08/2017 0922   CREATININE 0.81 11/08/2017 0922   CALCIUM 9.5 11/08/2017 0922   GFRNONAA 90 11/08/2017 0922   GFRAA 104 11/08/2017 0922   Lab Results  Component Value Date   HGBA1C 5.4 11/08/2017   HGBA1C 5.6 08/29/2017   HGBA1C 5.6 05/30/2014   Lab Results  Component Value Date   INSULIN 18.8 11/08/2017   CBC    Component Value Date/Time   WBC 14.4 (H) 08/29/2017 1500   WBC 10.6 (H) 07/10/2014 0400   RBC 3.98 08/29/2017 1500   RBC 3.30 (L) 07/10/2014 0400   HGB 12.2 08/29/2017 1500   HCT 36.7 08/29/2017 1500   PLT 417 (H) 08/29/2017 1500   MCV 92 08/29/2017 1500   MCH 30.7 08/29/2017 1500   MCH 29.7 07/10/2014 0400   MCHC 33.2 08/29/2017 1500   MCHC 32.6 07/10/2014 0400   RDW 14.6  08/29/2017 1500   LYMPHSABS 3.7 06/01/2014 0552   MONOABS 0.9 06/01/2014 0552   EOSABS 0.4 06/01/2014 0552   BASOSABS 0.0 06/01/2014 0552   Iron/TIBC/Ferritin/ %Sat No results found for: IRON, TIBC, FERRITIN, IRONPCTSAT Lipid Panel     Component Value Date/Time   CHOL 152 08/29/2017 1500   TRIG 123 08/29/2017 1500   HDL 48 08/29/2017 1500   CHOLHDL 3.2 08/29/2017 1500   CHOLHDL 5.6 05/30/2014 1200   VLDL 45 (H) 05/30/2014 1200   LDLCALC 79 08/29/2017 1500   Hepatic Function Panel     Component Value Date/Time   PROT 7.7 11/08/2017 0922   ALBUMIN 4.3 11/08/2017 0922   AST 18 11/08/2017 0922   ALT 16 11/08/2017 0922   ALKPHOS 85 11/08/2017 0922   BILITOT 0.3 11/08/2017 0922   BILIDIR 0.08 09/06/2016 1322      Component Value Date/Time   TSH 3.880 11/08/2017 0922   TSH 4.310 08/29/2017 1500   Results for CANDID, BOVEY (MRN [id number]) as of 11/22/2017 16:49  Ref. Range 11/08/2017 09:22  Vitamin D, 25-Hydroxy Latest Ref Range: 30.0 - 100.0 ng/mL 30.2   ASSESSMENT AND PLAN: Vitamin D deficiency - Plan: Vitamin D, Ergocalciferol, (DRISDOL) 50000 units CAPS capsule  Insulin resistance  At risk for diabetes mellitus  Class 3 severe obesity with serious comorbidity and  body mass index (BMI) of 50.0 to 59.9 in adult, unspecified obesity type (HCC)  PLAN:  Vitamin D Deficiency (new) Rebecca Russo was informed that low vitamin D levels contributes to fatigue and are associated with obesity, breast, and colon cancer. She agrees to start prescription Vit D @50 ,000 IU every week #4 with no refills and will follow up for routine testing of vitamin D, at least 2-3 times per year. She was informed of the risk of over-replacement of vitamin D and agrees to not increase her dose unless she discusses this with [place] first. Rebecca Russo agrees to follow up as directed.  Insulin Resistance (new) Rebecca Russo will continue to work on weight loss, exercise, and decreasing simple carbohydrates in her diet to help decrease the risk of diabetes. She was informed that eating too many simple carbohydrates or too many calories at one sitting increases the likelihood of GI side effects. We will defer metformin for now. Education on diabetes prevention was given today. We will recheck labs in 3 months. Rebecca Russo agreed to get back to her diet prescription and follow up with [place] as directed to monitor her progress.  Diabetes risk counseling Rebecca Russo was given extended (30 minutes) diabetes prevention counseling today. She is 42 y.o. female and has risk factors for diabetes including obesity and insulin resistance We discussed intensive lifestyle modifications today with an emphasis on weight loss as well as increasing exercise and decreasing simple carbohydrates in her diet.  Obesity Rebecca Russo is currently in the action stage of change. As such, her goal is to continue with weight loss efforts She has agreed to follow the Category 2 plan Rebecca Russo has been instructed to work up to a goal of 150 minutes of combined cardio and strengthening exercise per week for weight loss and overall health benefits. We discussed the following Behavioral Modification Strategies today: ways to identify hidden calories, mindful eating  strategies, no skipping meals and work on meal planning and easy cooking plans  Patient was offered support for whatever she chooses for weight loss.  Rebecca Russo has agreed to follow up with our clinic in 2 weeks. She was informed of the importance of frequent  follow up visits to maximize her success with intensive lifestyle modifications for her multiple health conditions.   OBESITY BEHAVIORAL INTERVENTION VISIT  Today's visit was # 2 out of 22.  Starting weight: 352 lbs Starting date: 11/08/17 Today's weight : @353  lbs Today's date: 11/22/2017 Total lbs lost to date: 0 (Patients must lose 7 lbs in the first 6 months to continue with counseling)   ASK: We discussed the diagnosis of obesity with Rebecca Russo Camel today and Rebecca Russo agreed to give [place] permission to discuss obesity behavioral modification therapy today.  ASSESS: Rebecca Russo has the diagnosis of obesity and her BMI today is 50.65 Rebecca Russo is in the action stage of change   ADVISE: Sebastian was educated on the multiple health risks of obesity as well as the benefit of weight loss to improve her health. She was advised of the need for long term treatment and the importance of lifestyle modifications.  AGREE: Multiple dietary modification options and treatment options were discussed and  Rebecca Russo agreed to the above obesity treatment plan.  I, Nevada Crane, am acting as transcriptionist for Quillian Quince, MD  I have reviewed the above documentation for accuracy and completeness, and I agree with the above. -Quillian Quince, MD

## 2017-11-28 ENCOUNTER — Encounter: Payer: Self-pay | Admitting: Allergy

## 2017-11-28 ENCOUNTER — Ambulatory Visit (INDEPENDENT_AMBULATORY_CARE_PROVIDER_SITE_OTHER): Payer: 59 | Admitting: Allergy

## 2017-11-28 VITALS — BP 118/70 | HR 74 | Temp 98.4°F | Resp 18 | Ht 69.75 in | Wt 354.0 lb

## 2017-11-28 DIAGNOSIS — H1010 Acute atopic conjunctivitis, unspecified eye: Secondary | ICD-10-CM

## 2017-11-28 DIAGNOSIS — J309 Allergic rhinitis, unspecified: Secondary | ICD-10-CM | POA: Diagnosis not present

## 2017-11-28 MED ORDER — OLOPATADINE HCL 0.2 % OP SOLN: 1.0000 [drp] | Freq: Every day | OPHTHALMIC | 3 refills | Status: DC | PRN

## 2017-11-28 MED ORDER — CARBINOXAMINE MALEATE 6 MG PO TABS: 1.0000 | ORAL_TABLET | Freq: Two times a day (BID) | ORAL | 3 refills | Status: DC

## 2017-11-28 MED ORDER — AZELASTINE HCL 0.1 % NA SOLN: 2.0000 | Freq: Two times a day (BID) | NASAL | 3 refills | Status: DC

## 2017-11-28 MED ORDER — TRIAMCINOLONE ACETONIDE 55 MCG/ACT NA AERO: 2.0000 | INHALATION_SPRAY | Freq: Every day | NASAL | 3 refills | Status: DC

## 2017-11-28 MED ORDER — MONTELUKAST SODIUM 10 MG PO TABS: 10.0000 mg | ORAL_TABLET | Freq: Every day | ORAL | 3 refills | Status: DC

## 2017-11-28 MED FILL — MONTELUKAST SOD 10 MG TAB: 10 | 30 days supply | Qty: 30 | Fill #0

## 2017-11-28 MED FILL — AZELASTINE HCL 137 MCG SPRY: 0.1 | 30 days supply | Qty: 30 | Fill #0

## 2017-11-28 NOTE — Patient Instructions (Addendum)
Allergic rhinoconjunctivitis  -Environmental allergy skin prick testing today is positive to grass pollen and molds  -Allergen avoidance measures discussed and provided  -Start Singulair 10 mg daily -take at bedtime  -Stop Xyzal.  Start RyVent 6mg   - 1 tablet twice a day  -trial Dymista 1 spray each nostril twice a day.  This is a combination nasal spray with Flonase + Astelin (nasal antihistamine).  This helps with both nasal congestion and drainage.  This nasal spray is not covered well by insurance and if not will prescribe the nasal spray separately as follows:     - Nasacort 2 sprays each nostril daily as needed for congestion.  Use for 1-2 weeks at a time before stopping once symptoms improve     - Nasal antihistamine spray, Astelin, 2 sprays each nostril twice a day, for nasal drainage/post-nasal drip  -For itchy, watery, red eyes use Pazeo or Pataday 1 drop each eye as needed daily  -allergen immunotherapy discussed today including protocol, benefits and risk.  Informational handout provided.  If interested in this therapuetic option you can check with your insurance carrier for coverage.  Let us know if you would like to proceed with this option.    Follow-up 4 months or sooner if needed

## 2017-11-28 NOTE — Progress Notes (Signed)
New Patient Note  RE: Rebecca Russo MRN: [id number] DOB: 12/26/1975 Date of Office Visit: 11/28/2017  Referring provider: Doristine Bosworth, MD Primary care provider: Doristine Bosworth, MD  Chief Complaint: nasal congestion, allergies  History of present illness: Rebecca Russo is a 42 y.o. female presenting today for consultation for allergic rhinitis.    She reports nasal congestion with throat clearing with cough, sinus pressure, watery eyes, sneezing, some ear itch.  Symptoms are year round and worse at night.  She has tried most of the OTC allergy medications (claritin, zyrtec, flonase, zaditor) that did not help.  Most recently has tried xyzal for about a month which also did not help.  She also has tried mucinex and OTC decongestant medications which she also does not feel has helped control her symptoms.  She has not tried Singulair.  No history of asthma, eczema, food allergy.      Review of systems: Review of Systems  Constitutional: Negative for chills, fever and malaise/fatigue.  HENT: Positive for congestion and sinus pain. Negative for ear discharge, ear pain, nosebleeds and sore throat.   Eyes: Negative for pain, discharge and redness.  Respiratory: Positive for cough. Negative for sputum production, shortness of breath and wheezing.   Cardiovascular: Negative for chest pain.  Gastrointestinal: Negative for abdominal pain, constipation, diarrhea, heartburn, nausea and vomiting.  Musculoskeletal: Negative for joint pain.  Skin: Negative for itching and rash.  Neurological: Negative for headaches.    All other systems negative unless noted above in HPI  Past medical history: Past Medical History:  Diagnosis Date  . Aphasia complicating stroke   . Bilateral swelling of feet   . Chronic systolic heart failure (HCC) 06/09/2016   a - Prob nonischemic //  Echo 10/17: EF 40-45, diffuse HK, mild LAE, PFO closure device without residual shunt  //  Could not tol cMRI //  Echo 4/18: EF 55, normal wall motion, normal diastolic function, trivial MR, PFO device in place without residual shunt, PASP 24  . Constipation   . Eczema   . H/O varicella   . Hemiplegia affecting dominant side (HCC)   . History of ischemic left MCA stroke   . Increased BMI 12/17/08  . PFO with atrial septal aneurysm 06/04/2014   a - s/p transcatheter closure in 2/16 with 25 mm Amplatzer Cribiform ASO device // b - Echo 10/17:  EF 40-45, diffuse HK, mild LAE, PFO closure device without residual shunt   . Stroke (cerebrum) [facility])     Past surgical history: Past Surgical History:  Procedure Laterality Date  . LAPAROSCOPIC CHOLECYSTECTOMY  06/20/2012  . PATENT FORAMEN OVALE CLOSURE  07/09/2014  . PATENT FORAMEN OVALE CLOSURE N/A 07/09/2014   Procedure: PATENT FORAMEN OVALE CLOSURE;  Surgeon: Micheline Chapman, MD;  [facility] CATH LAB;  Service: Cardiovascular;  Laterality: N/A;  . RADIOLOGY WITH ANESTHESIA N/A 05/29/2014   Procedure: RADIOLOGY WITH ANESTHESIA;  Surgeon: Oneal Grout, MD;  Location: [facility] OR;  Service: Radiology;  Laterality: N/A;  . TEE WITHOUT CARDIOVERSION N/A 06/03/2014   Procedure: TRANSESOPHAGEAL ECHOCARDIOGRAM (TEE);  Surgeon: Wendall Stade, MD;  Location: [facility] ENDOSCOPY;  Service: Cardiovascular;  Laterality: N/A;    Family history:  Family History  Problem Relation Age of Onset  . Hypertension Mother   . Diabetes Mother   . Heart disease Mother   . Sleep apnea Mother   . Hypertension Father   . Stroke Father   . Diabetes Father   .  Heart disease Father   . Stroke Paternal Grandfather   . Stroke Maternal Grandfather     Social history: She lives in a home with carpeting with gas heating and central cooling.  No pets in the home.  No concern for water damage, mildew or roaches in the home.  She is a Public librarian.  She denies a smoking history.  Medication List: Allergies as of 11/28/2017      Reactions   No Known Allergies       Medication  List        Accurate as of 11/28/17 10:23 AM. Always use your most recent med list.          azelastine 0.1 % nasal spray Commonly known as:  ASTELIN Place 2 sprays into both nostrils 2 (two) times daily.   [name] 0.35 MG tablet Generic drug:  norethindrone Take 1 tablet (0.35 mg total) by mouth daily.   Carbinoxamine Maleate 6 MG Tabs Commonly known as:  RYVENT Take 1 tablet by mouth 2 (two) times daily.   clopidogrel 75 MG tablet Commonly known as:  PLAVIX Take 1 tablet (75 mg total) by mouth daily.   levocetirizine 5 MG tablet Commonly known as:  XYZAL ALLERGY 24HR Take 1 tablet (5 mg total) by mouth every evening.   montelukast 10 MG tablet Commonly known as:  SINGULAIR Take 1 tablet (10 mg total) by mouth at bedtime.   Olopatadine HCl 0.2 % Soln Commonly known as:  PATADAY Place 1 drop into both eyes daily as needed.   rosuvastatin 5 MG tablet Commonly known as:  CRESTOR Take 1 tablet (5 mg total) by mouth daily. Please call and make an appt for further refills. 1st attempt   triamcinolone 55 MCG/ACT Aero nasal inhaler Commonly known as:  NASACORT Place 2 sprays into the nose daily.   Vitamin D (Ergocalciferol) 50000 units Caps capsule Commonly known as:  DRISDOL Take 1 capsule (50,000 Units total) by mouth every 7 (seven) days.       Known medication allergies: Allergies  Allergen Reactions  . No Known Allergies      Physical examination: Blood pressure 118/70, pulse 74, temperature 98.4 F (36.9 C), temperature source Oral, resp. rate 18, height 5' 9.75" (1.772 m), weight (!) 354 lb (160.6 kg), last menstrual period 11/05/2017, SpO2 98 %.  General: Alert, interactive, in no acute distress, obese. HEENT: PERRLA, TMs pearly gray, turbinates moderately edematous without discharge, post-pharynx non erythematous, positive cobblestoning in posterior oropharynx. Neck: Supple without lymphadenopathy. Lungs: Clear to auscultation without wheezing, rhonchi  or rales. {no increased work of breathing. CV: Normal S1, S2 without murmurs. Abdomen: Nondistended, nontender. Skin: Warm and dry, without lesions or rashes. Extremities:  No clubbing, cyanosis or edema. Neuro:   Grossly intact.  Diagnositics/Labs:  Allergy testing: Environmental allergy skin prick testing is [place] grass Intradermal testing is johnson and mold mix 4 Allergy testing results were read and interpreted by provider, documented by clinical staff.   Assessment and plan:   Allergic rhinoconjunctivitis  -Environmental allergy skin prick testing today is positive to grass pollen and molds  -Allergen avoidance measures discussed and provided  -Start Singulair 10 mg daily -take at bedtime  -Stop Xyzal.  Start RyVent 6mg   - 1 tablet twice a day  -trial Dymista 1 spray each nostril twice a day.  This is a combination nasal spray with Flonase + Astelin (nasal antihistamine).  This helps with both nasal congestion and drainage.  This nasal spray is  not covered well by insurance and if not will prescribe the nasal spray separately as follows:     -  Nasacort 2 sprays each nostril daily as needed for congestion.  Use for 1-2 weeks at a time before stopping once symptoms improve     - Nasal antihistamine spray, Astelin, 2 sprays each nostril twice a day, for nasal drainage/post-nasal drip  -For itchy, watery, red eyes use Pazeo or Pataday 1 drop each eye as needed daily  -will obtain environmental allergy profile to ensure all allergens she may be allergic to is captured  -allergen immunotherapy discussed today including protocol, benefits and risk.  Informational handout provided.  If interested in this therapuetic option you can check with your insurance carrier for coverage.  Let us know if you would like to proceed with this option.    Follow-up 4 months or sooner if needed  I appreciate the opportunity to take part in Careli's care. Please do not hesitate to contact me with  questions.  Sincerely,   Margo Aye, MD Allergy/Immunology [facility]

## 2017-11-30 ENCOUNTER — Encounter (INDEPENDENT_AMBULATORY_CARE_PROVIDER_SITE_OTHER): Payer: Self-pay | Admitting: Family Medicine

## 2017-12-01 LAB — ALLERGENS W/TOTAL IGE AREA 2
Alternaria Alternata IgE: <0.1 kU/L
Aspergillus Fumigatus IgE: <0.1 kU/L
Bermuda Grass IgE: <0.1 kU/L
Cat Dander IgE: <0.1 kU/L
Cedar, Mountain IgE: <0.1 kU/L
Cladosporium Herbarum IgE: <0.1 kU/L
Cockroach, German IgE: <0.1 kU/L
Common Silver Birch IgE: <0.1 kU/L
Cottonwood IgE: <0.1 kU/L
D Pteronyssinus IgE: <0.1 kU/L
Dog Dander IgE: <0.1 kU/L
Elm, American IgE: <0.1 kU/L
IgE (Immunoglobulin E), Serum: 7 IU/mL (ref 6–495)
Johnson Grass IgE: <0.1 kU/L
Oak, White IgE: <0.1 kU/L
Pigweed, Rough IgE: <0.1 kU/L
Ragweed, Short IgE: <0.1 kU/L
White Mulberry IgE: <0.1 kU/L

## 2017-12-03 NOTE — Telephone Encounter (Signed)
Did you get this?

## 2017-12-03 NOTE — Telephone Encounter (Signed)
Please address

## 2017-12-04 ENCOUNTER — Encounter: Payer: Self-pay | Admitting: Family Medicine

## 2017-12-06 ENCOUNTER — Encounter: Payer: Self-pay | Admitting: Family Medicine

## 2017-12-12 ENCOUNTER — Ambulatory Visit (INDEPENDENT_AMBULATORY_CARE_PROVIDER_SITE_OTHER): Payer: 59 | Admitting: Family Medicine

## 2017-12-17 ENCOUNTER — Encounter: Payer: Self-pay | Admitting: Family Medicine

## 2017-12-19 DIAGNOSIS — E639 Nutritional deficiency, unspecified: Secondary | ICD-10-CM | POA: Diagnosis not present

## 2017-12-19 DIAGNOSIS — Z6841 Body Mass Index (BMI) 40.0 and over, adult: Secondary | ICD-10-CM | POA: Diagnosis not present

## 2017-12-20 MED FILL — CAMILA 0.35 MG TABS: 0.35 | 28 days supply | Qty: 28 | Fill #2

## 2017-12-27 ENCOUNTER — Telehealth: Payer: Self-pay | Admitting: Cardiovascular Disease

## 2017-12-27 ENCOUNTER — Other Ambulatory Visit (HOSPITAL_COMMUNITY): Payer: Self-pay | Admitting: General Surgery

## 2017-12-27 DIAGNOSIS — Z6841 Body Mass Index (BMI) 40.0 and over, adult: Principal | ICD-10-CM

## 2017-12-27 NOTE — Telephone Encounter (Signed)
New message   Patient wants to know if she still needs a lipid and liver test before the f/u  appt on 04/03/2018 at 9:00 am. Please advise

## 2017-12-27 NOTE — Telephone Encounter (Signed)
Per DPR form, left detailed message for patient that her fasting labs were drawn in April so no further labs are necessary. Instructed her to call with questions or concerns.

## 2018-01-03 ENCOUNTER — Other Ambulatory Visit (HOSPITAL_COMMUNITY): Payer: Self-pay | Admitting: General Surgery

## 2018-01-03 DIAGNOSIS — Z6841 Body Mass Index (BMI) 40.0 and over, adult: Principal | ICD-10-CM

## 2018-01-07 ENCOUNTER — Ambulatory Visit (HOSPITAL_COMMUNITY): Payer: 59

## 2018-01-08 ENCOUNTER — Ambulatory Visit (HOSPITAL_COMMUNITY)
Admission: RE | Admit: 2018-01-08 | Discharge: 2018-01-08 | Disposition: A | Discharge status: Home / Self Care | Payer: 59 | Source: Ambulatory Visit | Attending: General Surgery | Admitting: General Surgery

## 2018-01-08 ENCOUNTER — Other Ambulatory Visit: Payer: Self-pay

## 2018-01-08 DIAGNOSIS — K219 Gastro-esophageal reflux disease without esophagitis: Secondary | ICD-10-CM | POA: Diagnosis not present

## 2018-01-08 DIAGNOSIS — R9431 Abnormal electrocardiogram [ECG] [EKG]: Secondary | ICD-10-CM | POA: Diagnosis not present

## 2018-01-08 DIAGNOSIS — Z6841 Body Mass Index (BMI) 40.0 and over, adult: Secondary | ICD-10-CM | POA: Insufficient documentation

## 2018-01-08 DIAGNOSIS — Q211 Atrial septal defect: Secondary | ICD-10-CM | POA: Insufficient documentation

## 2018-01-11 ENCOUNTER — Ambulatory Visit (HOSPITAL_COMMUNITY): Payer: 59

## 2018-01-23 ENCOUNTER — Ambulatory Visit: Payer: Self-pay | Admitting: Registered"

## 2018-01-31 MED FILL — CAMILA 0.35 MG TABS: 0.35 | 28 days supply | Qty: 28 | Fill #3

## 2018-02-06 ENCOUNTER — Encounter: Payer: 59 | Attending: General Surgery | Admitting: Registered"

## 2018-02-06 ENCOUNTER — Encounter: Payer: Self-pay | Admitting: Registered"

## 2018-02-06 DIAGNOSIS — Z6841 Body Mass Index (BMI) 40.0 and over, adult: Secondary | ICD-10-CM | POA: Insufficient documentation

## 2018-02-06 DIAGNOSIS — Z713 Dietary counseling and surveillance: Secondary | ICD-10-CM | POA: Insufficient documentation

## 2018-02-06 DIAGNOSIS — E669 Obesity, unspecified: Secondary | ICD-10-CM

## 2018-02-06 NOTE — Progress Notes (Signed)
Pre-Op Assessment Visit:  Pre-Operative Sleeve Gastrectomy Surgery  Medical Nutrition Therapy:  Appt start time: 8:35  End time: 9:22  Patient was seen on 02/06/2018 for Pre-Operative Nutrition Assessment. Assessment and letter of approval faxed to [facility] Bariatric Surgery Program coordinator on 02/06/2018.   Pt expectation of surgery: to be healthier, to exercise more, be more active with children (ages 50 and 33), able to do more  Pt expectation of Dietitian: help knowing what to eat and what not to eat, support, likes encouragement  Start weight at [facility]: 355.2 BMI: 50.25   Pt states she had a stroke 3 years ago due to having a hole in her heart. Pt states she cooks meals when her son is home. Pt states she typically does not cook during the week because she is only cooking for herself and not hungry after working and going to the gym.   Per insurance, pt needs 2 SWL visits prior to surgery. Pt will need Vitamin and Mineral Recommendations and next visit.    24 hr Dietary Recall: First Meal: typically skips; oatmeal Snack: none Second Meal: fast food Snack: none Third Meal: sometimes skips; chips + water or retaurant Snack: none Beverages: fruit punch, sweet tea, water  Encouraged to engage in 75 minutes of moderate physical activity including cardiovascular and weight baring weekly  Handouts given during visit include:  . Pre-Op Goals . Bariatric Surgery Protein Shakes  During the appointment today the following Pre-Op Goals were reviewed with the patient: . Track your food and beverage: MyFitness Pal or Baritastic App . Make healthy food choices . Begin to limit portion sizes . Limited concentrated sugars and fried foods . Keep fat/sugar in the single digits per serving on          food labels . Practice CHEWING your food  (aim for 30 chews per bite or until applesauce consistency) . Practice not drinking 15 minutes before, during, and 30 minutes after  each meal/snack . Avoid all carbonated beverages  . Avoid/limit caffeinated beverages  . Avoid all sugar-sweetened beverages . Avoid alcohol . Consume 3 meals per day; eat every 3-5 hours . Make a list of non-food related activities . Aim for 64-100 ounces of FLUID daily  . Aim for at least 60-80 grams of PROTEIN daily . Look for a liquid protein source that contain ?15 g protein and ?5 g carbohydrate  (ex: shakes, drinks, shots) . Physical activity is an important part of a healthy lifestyle so keep it moving!  Follow diet recommendations listed below Energy and Macronutrient Recommendations: Calories: 1800 Carbohydrate: 200 Protein: 135 Fat: 50  Demonstrated degree of understanding via:  Teach Back   Teaching Method Utilized:  Visual Auditory Hands on  Barriers to learning/adherence to lifestyle change: none identified  Patient to call the Nutrition and Diabetes Education Services to enroll in Pre-Op and Post-Op Nutrition Education when surgery date is scheduled.

## 2018-02-07 ENCOUNTER — Encounter

## 2018-02-26 ENCOUNTER — Ambulatory Visit (INDEPENDENT_AMBULATORY_CARE_PROVIDER_SITE_OTHER): Payer: 59 | Admitting: Psychiatry

## 2018-02-26 DIAGNOSIS — F509 Eating disorder, unspecified: Secondary | ICD-10-CM | POA: Diagnosis not present

## 2018-02-27 MED FILL — CAMILA 0.35 MG TABS: 0.35 | 28 days supply | Qty: 28 | Fill #4

## 2018-03-05 ENCOUNTER — Encounter: Payer: 59 | Attending: General Surgery | Admitting: Registered"

## 2018-03-05 ENCOUNTER — Encounter: Payer: Self-pay | Admitting: Registered"

## 2018-03-05 DIAGNOSIS — Z713 Dietary counseling and surveillance: Secondary | ICD-10-CM | POA: Insufficient documentation

## 2018-03-05 DIAGNOSIS — Z6841 Body Mass Index (BMI) 40.0 and over, adult: Secondary | ICD-10-CM | POA: Diagnosis not present

## 2018-03-05 DIAGNOSIS — E669 Obesity, unspecified: Secondary | ICD-10-CM

## 2018-03-05 NOTE — Patient Instructions (Addendum)
-   Aim to eat at least 3 meals a day.   - Aim to not drink 15 minutes before eating, not while eating, and waiting 30 minutes after eating to drink.   - Keep up the great work!

## 2018-03-05 NOTE — Progress Notes (Signed)
Sleeve Gastrectomy Appt start time: 4:10 end time: 4:30  Assessment: 1st SWL Appointment.   Start Wt at [facility]: 355.2 Wt: 355.9 BMI: 50.34   Pt arrives having maintained weight from previous visit.   Pt states she has eliminated sweet tea. Pt states she is not hungry for breakfast. Pt states she likes Premier protein (strawberries and cream, bananas and cream, and vanilla flavors).   Per insurance, pt needs 2 SWL visits prior to surgery.   MEDICATIONS: See list   DIETARY INTAKE:  24-hr recall:  B ( AM): oatmeal  Snk ( AM): none  L ( PM): salad + 100 calorie cheez-its Snk ( PM): none D ( PM): sometimes skips; protein shake or lean cuisine Snk ( PM): none Beverages: water with flavor packs (64+ ounces water)   Usual physical activity: cardio and strength training 90 min, 4 days/week   Diet to Follow: 1800 calories 200 g carbohydrates 135 g protein 50 g fat  Preferred Learning Style:   No preference indicated   Learning Readiness:   Ready  Change in progress     Nutritional Diagnosis:  [place]-3.3 Overweight/obesity related to past poor dietary habits and physical inactivity as evidenced by patient w/ planned sleeve gastrectomy surgery following dietary guidelines for continued weight loss.    Intervention:  Nutrition counseling for upcoming Bariatric Surgery. Pt was educated and counseled on bariatric multivitamin and mineral recommendations. Pt was in agreement with goals listed.  Goals:  - Aim to eat at least 3 meals a day.  - Aim to not drink 15 minutes before eating, not while eating, and waiting 30 minutes after eating to drink.  - Keep up the great work!  Teaching Method Utilized:  Visual Auditory Hands on  Handouts given during visit include:  Vitamin and Mineral Recommendations  Barriers to learning/adherence to lifestyle change: none identified  Demonstrated degree of understanding via:  Teach Back   Monitoring/Evaluation:  Dietary intake,  exercise, and body weight in 1 month(s).

## 2018-03-06 ENCOUNTER — Ambulatory Visit: Payer: Self-pay | Admitting: Registered"

## 2018-03-12 ENCOUNTER — Ambulatory Visit (INDEPENDENT_AMBULATORY_CARE_PROVIDER_SITE_OTHER): Payer: 59 | Admitting: Psychiatry

## 2018-03-12 DIAGNOSIS — F509 Eating disorder, unspecified: Secondary | ICD-10-CM | POA: Diagnosis not present

## 2018-03-18 ENCOUNTER — Encounter: Payer: Self-pay | Admitting: Family Medicine

## 2018-03-18 ENCOUNTER — Telehealth: Payer: Self-pay | Admitting: Neurology

## 2018-03-18 NOTE — Telephone Encounter (Signed)
Noted Thanks Dana  °

## 2018-03-18 NOTE — Telephone Encounter (Signed)
Patient called and stated that she is a previous patient of Dr. Warren Danes she would like [place] to send a referral to Dr. Patrcia Dolly.

## 2018-03-18 NOTE — Telephone Encounter (Signed)
If patient calls back please let her know she needs to contact her primary doctor if she wants a referral to DR. Patrcia Dolly. VM was left for pt with the below message.   Left vm for patient that she will have to contact primary doctor for a referral to DR. Patrcia Dolly. PT was release back to her primary doctor by Dr. [name] in 09/2016.

## 2018-03-20 ENCOUNTER — Encounter: Payer: Self-pay | Admitting: Family Medicine

## 2018-03-25 ENCOUNTER — Encounter: Payer: 59 | Attending: General Surgery | Admitting: Skilled Nursing Facility1

## 2018-03-25 ENCOUNTER — Encounter: Payer: Self-pay | Admitting: Skilled Nursing Facility1

## 2018-03-25 DIAGNOSIS — Z713 Dietary counseling and surveillance: Secondary | ICD-10-CM | POA: Insufficient documentation

## 2018-03-25 DIAGNOSIS — E669 Obesity, unspecified: Secondary | ICD-10-CM

## 2018-03-25 DIAGNOSIS — Z6841 Body Mass Index (BMI) 40.0 and over, adult: Secondary | ICD-10-CM | POA: Insufficient documentation

## 2018-03-25 NOTE — Progress Notes (Signed)
Pre-Operative Nutrition Class:  Appt start time: 5631   End time:  1830.  Patient was seen on 03/25/2018 for Pre-Operative Bariatric Surgery Education at the [facility].   Surgery date:  Surgery type: sleeve Start weight at [facility]: 355 Weight today: 359.7  Samples given per MNT protocol. Patient educated on appropriate usage: Bariatric Advantage Multivitamin Lot # [id number] Exp:10/20  Bariatric Advantage Calcium  Lot # [id number] Exp: jul-25-2020  Renee Pain Protein Powder  Shake Lot # [id number]         March-11-2018 Exp: 07/2019        [id number] The following the learning objectives were met by the patient during this course:  Identify Pre-Op Dietary Goals and will begin 2 weeks pre-operatively  Identify appropriate sources of fluids and proteins   State protein recommendations and appropriate sources pre and post-operatively  Identify Post-Operative Dietary Goals and will follow for 2 weeks post-operatively  Identify appropriate multivitamin and calcium sources  Describe the need for physical activity post-operatively and will follow MD recommendations  State when to call healthcare provider regarding medication questions or post-operative complications  Handouts given during class include:  Pre-Op Bariatric Surgery Diet Handout  Protein Shake Handout  Post-Op Bariatric Surgery Nutrition Handout  BELT Program Information Flyer  Support Group Information Flyer  [facility] Bariatric Supplements Price List  Follow[facility] Plan: [facility] per MD.

## 2018-03-27 MED FILL — CAMILA 0.35 MG TABS: 0.35 | 28 days supply | Qty: 28 | Fill #5

## 2018-03-28 ENCOUNTER — Encounter: Payer: Self-pay | Admitting: Family Medicine

## 2018-04-03 ENCOUNTER — Ambulatory Visit (INDEPENDENT_AMBULATORY_CARE_PROVIDER_SITE_OTHER): Payer: 59 | Admitting: Cardiovascular Disease

## 2018-04-03 ENCOUNTER — Encounter: Payer: Self-pay | Admitting: Cardiovascular Disease

## 2018-04-03 VITALS — BP 118/84 | HR 74 | Ht 70.5 in | Wt 361.0 lb

## 2018-04-03 DIAGNOSIS — E782 Mixed hyperlipidemia: Secondary | ICD-10-CM | POA: Diagnosis not present

## 2018-04-03 DIAGNOSIS — I428 Other cardiomyopathies: Secondary | ICD-10-CM | POA: Diagnosis not present

## 2018-04-03 DIAGNOSIS — R0602 Shortness of breath: Secondary | ICD-10-CM

## 2018-04-03 DIAGNOSIS — Q211 Atrial septal defect: Secondary | ICD-10-CM

## 2018-04-03 DIAGNOSIS — Q2112 Patent foramen ovale: Secondary | ICD-10-CM

## 2018-04-03 MED ORDER — POTASSIUM CHLORIDE ER 10 MEQ PO TBCR: 10.0000 meq | EXTENDED_RELEASE_TABLET | Freq: Every day | ORAL | 11 refills | Status: DC

## 2018-04-03 MED ORDER — FUROSEMIDE 20 MG PO TABS: 20.0000 mg | ORAL_TABLET | Freq: Every day | ORAL | 11 refills | Status: DC

## 2018-04-03 MED FILL — POTASSIUM CHLORIDE CRYS ER: 10 | 30 days supply | Qty: 30 | Fill #0

## 2018-04-03 MED FILL — FUROSEMIDE 20 MG TABS: 20 | 30 days supply | Qty: 30 | Fill #0

## 2018-04-03 NOTE — Patient Instructions (Addendum)
Medication Instructions:  1) START LASIX 20 mg daily 2) START POTASSIUM 10 meq daily  Labwork: TODAY: BMET, BNP  Testing/Procedures: Your provider has requested that you have an echocardiogram. Echocardiography is a painless test that uses sound waves to create images of your heart. It provides your doctor with information about the size and shape of your heart and how well your heart's chambers and valves are working. This procedure takes approximately one hour. There are no restrictions for this procedure.  Follow-Up: Your provider wants you to follow-up in: 1 year with Dr. Excell Seltzer. You will receive a reminder letter in the mail two months in advance. If you don't receive a letter, please call our office to schedule the follow-up appointment.

## 2018-04-03 NOTE — Progress Notes (Signed)
Cardiology Office Note:    Date:  04/03/2018   ID:  Rebecca Russo, DOB Jan 17, 1976, MRN [id number]  PCP:  Doristine Bosworth, MD  Cardiologist:  Tonny Bollman, MD  Electrophysiologist:  None   Referring MD: Doristine Bosworth, MD   Chief Complaint  Patient presents with  . Follow-up    PFO  . Shortness of Breath    History of Present Illness:    Rebecca Russo is a 42 y.o. female with a hx of PFO and cryptogenic stroke, presenting for follow-up evaluation.  The patient initially presented in 2016 with a left MCA infarct treated with mechanical thrombectomy.  She was found to have a large PFO with atrial septal aneurysm and she underwent transcatheter closure with a 25 mm cribriform device.  At follow-up she was incidentally noted to have mild to moderate global LV systolic dysfunction with LVEF 40 to 45%.  She was not able to tolerate medical therapy because of symptomatic hypotension, even with low doses of losartan and carvedilol.  LVEF normalized on her most recent echo study from 2018.  Continued observation was recommended.  She presents today for follow-up evaluation.  She is here alone today. Reports symptoms of exertional dyspnea, orthopnea, and PND. She's been going to the gym and has to stop and rest intermittently to catch her breath. She's watching her sodium intake and adds no salt to her food, avoids canned foods.  She denies chest pain, chest pressure, lightheadedness, or syncope.  She is had no significant leg swelling.  Past Medical History:  Diagnosis Date  . Aphasia complicating stroke   . Bilateral swelling of feet   . Chronic systolic heart failure (HCC) 06/09/2016   a - Prob nonischemic //  Echo 10/17: EF 40-45, diffuse HK, mild LAE, PFO closure device without residual shunt  //  Could not tol cMRI // Echo 4/18: EF 55, normal wall motion, normal diastolic function, trivial MR, PFO device in place without residual shunt, PASP 24  . Constipation   . Eczema   . H/O  varicella   . Hemiplegia affecting dominant side (HCC)   . History of ischemic left MCA stroke   . Increased BMI 12/17/08  . PFO with atrial septal aneurysm 06/04/2014   a - s/p transcatheter closure in 2/16 with 25 mm Amplatzer Cribiform ASO device // b - Echo 10/17:  EF 40-45, diffuse HK, mild LAE, PFO closure device without residual shunt   . Stroke (cerebrum) [facility])     Past Surgical History:  Procedure Laterality Date  . LAPAROSCOPIC CHOLECYSTECTOMY  06/20/2012  . PATENT FORAMEN OVALE CLOSURE  07/09/2014  . PATENT FORAMEN OVALE CLOSURE N/A 07/09/2014   Procedure: PATENT FORAMEN OVALE CLOSURE;  Surgeon: Micheline Chapman, MD;  Location: [facility] C[facility] LAB;  Service: Cardiovascular;  Laterality: N/A;  . RADIOLOGY WITH ANESTHESIA N/A 05/29/2014   Procedure: RADIOLOGY WITH ANESTHESIA;  Surgeon: Oneal Grout, MD;  Location: [facility] OR;  Service: Radiology;  Laterality: N/A;  . TEE WITHOUT CARDIOVERSION N/A 06/03/2014   Procedure: TRANSESOPHAGEAL ECHOCARDIOGRAM (TEE);  Surgeon: Wendall Stade, MD;  Location: [facility] ENDOSCOPY;  Service: Cardiovascular;  Laterality: N/A;    Current Medications: Current Meds  Medication Sig  . [name] 0.35 MG tablet Take 1 tablet (0.35 mg total) by mouth daily.  . Citalopram Hydrobromide (CELEXA PO) Take by mouth as needed.  . clopidogrel (PLAVIX) 75 MG tablet Take 1 tablet (75 mg total) by mouth daily.  . rosuvastatin (CRESTOR) 5 MG  tablet Take 1 tablet (5 mg total) by mouth daily. Please call and make an appt for further refills. 1st attempt  . Vitamin D, Ergocalciferol, (DRISDOL) 50000 units CAPS capsule Take 1 capsule (50,000 Units total) by mouth every 7 (seven) days.     Allergies:   No known allergies   Social History   Socioeconomic History  . Marital status: Single    Spouse name: Not on file  . Number of children: 2  . Years of education: Associates  . Highest education level: Not on file  Occupational History  . Occupation: Radiology Scheduler  Social  Needs  . Financial resource strain: Not on file  . Food insecurity:    Worry: Not on file    Inability: Not on file  . Transportation needs:    Medical: Not on file    Non-medical: Not on file  Tobacco Use  . Smoking status: Never Smoker  . Smokeless tobacco: Never Used  Substance and Sexual Activity  . Alcohol use: No    Alcohol/week: 0.0 standard drinks  . Drug use: No  . Sexual activity: Not Currently  Lifestyle  . Physical activity:    Days per week: Not on file    Minutes per session: Not on file  . Stress: Not on file  Relationships  . Social connections:    Talks on phone: Not on file    Gets together: Not on file    Attends religious service: Not on file    Active member of club or organization: Not on file    Attends meetings of clubs or organizations: Not on file    Relationship status: Not on file  Other Topics Concern  . Not on file  Social History Narrative   Patient is single with 2 children.   Patient is right handed.   Patient has her Associates degree.   Patient drinks 1 soda a month.     Family History: The patient's family history includes Cancer in her other; Diabetes in her father and mother; Heart disease in her father and mother; Hypertension in her father and mother; Sleep apnea in her mother; Stroke in her father, maternal grandfather, and paternal grandfather.  ROS:   Please see the history of present illness.    All other systems reviewed and are negative.  EKGs/Labs/Other Studies Reviewed:    The following studies were reviewed today: 2D Echo 09-04-2016: Study Conclusions  - Left ventricle: The cavity size was normal. Wall thickness was   normal. The estimated ejection fraction was 55%. Wall motion was   normal; there were no regional wall motion abnormalities. Left   ventricular diastolic function parameters were normal. - Aortic valve: There was no stenosis. - Mitral valve: There was trivial regurgitation. - Right ventricle: The  cavity size was normal. Systolic function   was normal. - Atrial septum: PFO closure device in place, no definite residual   shunt identified. - Tricuspid valve: Peak RV-RA gradient (S): 21 mm Hg. - Pulmonary arteries: PA peak pressure: 24 mm Hg (S). - Inferior vena cava: The vessel was normal in size. The   respirophasic diameter changes were in the normal range (= 50%),   consistent with normal central venous pressure.  Impressions:  - Normal LV size with EF 55%. Normal RV size and systolic function.   Normal diastolic function. No significant valvular abnormalities.   PFO closure device in place, no definite residual shunt noted.  EKG:  EKG is not ordered today.  Recent Labs: 08/29/2017: Hemoglobin 12.2; Platelets 417 11/08/2017: ALT 16; BUN 12; Creatinine, Ser 0.81; Potassium 4.6; Sodium 140; TSH 3.880  Recent Lipid Panel    Component Value Date/Time   CHOL 152 08/29/2017 1500   TRIG 123 08/29/2017 1500   HDL 48 08/29/2017 1500   CHOLHDL 3.2 08/29/2017 1500   CHOLHDL 5.6 05/30/2014 1200   VLDL 45 (H) 05/30/2014 1200   LDLCALC 79 08/29/2017 1500    Physical Exam:    VS:  BP 118/84   Pulse 74   Ht 5' 10.5" (1.791 m)   Wt (!) 361 lb (163.7 kg)   SpO2 95%   BMI 51.07 kg/m     Wt Readings from Last 3 Encounters:  04/03/18 (!) 361 lb (163.7 kg)  03/25/18 (!) 359 lb 11.2 oz (163.2 kg)  03/05/18 (!) 355 lb 14.4 oz (161.4 kg)     GEN: Well nourished, well developed, pleasant obese woman in no acute distress HEENT: Normal NECK: No JVD; No carotid bruits LYMPHATICS: No lymphadenopathy CARDIAC: RRR, no murmurs, rubs, gallops RESPIRATORY:  Clear to auscultation without rales, wheezing or rhonchi  ABDOMEN: Soft, non-tender, non-distended MUSCULOSKELETAL:  No edema; No deformity  SKIN: Warm and dry NEUROLOGIC:  Alert and oriented x 3, moderate word finding difficulty unchanged from past visits PSYCHIATRIC:  Normal affect, tearful when discussing her weight  issues  ASSESSMENT:    1. PFO (patent foramen ovale)   2. Non-ischemic cardiomyopathy (HCC)   3. Morbid obesity (HCC)   4. Mixed hyperlipidemia   5. Shortness of breath    PLAN:    In order of problems listed above:  1. The patient has had no recurrent stroke or TIA since undergoing transcatheter PFO closure in 2016.  Echo studies have demonstrated normal device position and no residual shunt. 2. LVEF normalized by echocardiogram last year.  However, she now has symptoms concerning for congestive heart failure with exertional dyspnea, orthopnea, and PND.  Discussed lifestyle modification, sodium restriction, and weight loss measures.  Will update an echocardiogram.  Will add furosemide 20 mg daily with K-Dur 10 mEq daily.  Depending on echo findings, may need to rechallenge with beta-blocker and-or ARB at low doses. 3. Counseled regarding dietary modification and weight loss measures. 4. Most recent lipids reviewed as above with LDL cholesterol 79 mg/dL.  Continue statin, work on lifestyle modification.   Medication Adjustments/Labs and Tests Ordered: Current medicines are reviewed at length with the patient today.  Concerns regarding medicines are outlined above.  Orders Placed This Encounter  Procedures  . Basic metabolic panel  . Pro b natriuretic peptide (BNP)  . ECHOCARDIOGRAM COMPLETE   Meds ordered this encounter  Medications  . furosemide (LASIX) 20 MG tablet    Sig: Take 1 tablet (20 mg total) by mouth daily.    Dispense:  30 tablet    Refill:  11  . potassium chloride (K-DUR) 10 MEQ tablet    Sig: Take 1 tablet (10 mEq total) by mouth daily.    Dispense:  30 tablet    Refill:  11    Patient Instructions  Medication Instructions:  1) START LASIX 20 mg daily 2) START POTASSIUM 10 meq daily  Labwork: TODAY: BMET, BNP  Testing/Procedures: Your provider has requested that you have an echocardiogram. Echocardiography is a painless test that uses sound waves to  create images of your heart. It provides your doctor with information about the size and shape of your heart and how well your heart's chambers  and valves are working. This procedure takes approximately one hour. There are no restrictions for this procedure.  Follow-Up: Your provider wants you to follow-up in: 1 year with Dr. Excell Seltzer. You will receive a reminder letter in the mail two months in advance. If you don't receive a letter, please call our office to schedule the follow-up appointment.      Signed, Tonny Bollman, MD  04/03/2018 12:57 PM    [facility]

## 2018-04-04 ENCOUNTER — Ambulatory Visit: Payer: 59 | Admitting: Allergy

## 2018-04-04 DIAGNOSIS — J309 Allergic rhinitis, unspecified: Secondary | ICD-10-CM

## 2018-04-04 LAB — BASIC METABOLIC PANEL
BUN/Creatinine Ratio: 13 (ref 9–23)
BUN: 12 mg/dL (ref 6–24)
CALCIUM: 9.7 mg/dL (ref 8.7–10.2)
CHLORIDE: 104 mmol/L (ref 96–106)
CO2: 18 mmol/L — AB (ref 20–29)
Creatinine, Ser: 0.91 mg/dL (ref 0.57–1.00)
GFR, EST AFRICAN AMERICAN: 90 mL/min/{1.73_m2} (ref >59)
GFR, EST NON AFRICAN AMERICAN: 78 mL/min/{1.73_m2} (ref >59)
Glucose: 98 mg/dL (ref 65–99)
Potassium: 4.5 mmol/L (ref 3.5–5.2)
Sodium: 138 mmol/L (ref 134–144)

## 2018-04-04 LAB — PRO B NATRIURETIC PEPTIDE: NT-Pro BNP: 7 pg/mL (ref 0–130)

## 2018-04-05 ENCOUNTER — Ambulatory Visit: Payer: Self-pay | Admitting: Registered"

## 2018-04-05 ENCOUNTER — Other Ambulatory Visit: Payer: Self-pay

## 2018-04-05 ENCOUNTER — Ambulatory Visit (HOSPITAL_COMMUNITY): Payer: 59 | Attending: Cardiology

## 2018-04-05 DIAGNOSIS — R0602 Shortness of breath: Secondary | ICD-10-CM | POA: Insufficient documentation

## 2018-04-05 DIAGNOSIS — I517 Cardiomegaly: Secondary | ICD-10-CM

## 2018-04-05 HISTORY — DX: Cardiomegaly: I51.7

## 2018-04-08 MED FILL — CHLORHEXIDINE 0.12% RINSE: 0.12 | 10 days supply | Qty: 473 | Fill #0

## 2018-05-06 MED FILL — CAMILA 0.35 MG TABS: 0.35 | 84 days supply | Qty: 84 | Fill #6

## 2018-05-10 ENCOUNTER — Encounter: Payer: Self-pay | Admitting: Obstetrics and Gynecology

## 2018-05-16 ENCOUNTER — Ambulatory Visit: Payer: 59 | Admitting: Physician Assistant

## 2018-05-17 ENCOUNTER — Encounter: Payer: Self-pay | Admitting: Physician Assistant

## 2018-05-17 ENCOUNTER — Other Ambulatory Visit: Payer: Self-pay | Admitting: Physician Assistant

## 2018-05-17 ENCOUNTER — Ambulatory Visit (INDEPENDENT_AMBULATORY_CARE_PROVIDER_SITE_OTHER): Payer: 59 | Admitting: Physician Assistant

## 2018-05-17 ENCOUNTER — Encounter: Payer: Self-pay | Admitting: Neurology

## 2018-05-17 VITALS — BP 110/80 | HR 82 | Temp 98.5°F | Ht 70.0 in | Wt 364.0 lb

## 2018-05-17 DIAGNOSIS — I63412 Cerebral infarction due to embolism of left middle cerebral artery: Secondary | ICD-10-CM | POA: Diagnosis not present

## 2018-05-17 DIAGNOSIS — F320 Major depressive disorder, single episode, mild: Secondary | ICD-10-CM

## 2018-05-17 DIAGNOSIS — R05 Cough: Secondary | ICD-10-CM

## 2018-05-17 DIAGNOSIS — R059 Cough, unspecified: Secondary | ICD-10-CM

## 2018-05-17 MED ORDER — AMOXICILLIN-POT CLAVULANATE 875-125 MG PO TABS: 1.0000 | ORAL_TABLET | Freq: Two times a day (BID) | ORAL | 0 refills | Status: DC

## 2018-05-17 MED ORDER — CITALOPRAM HYDROBROMIDE 20 MG PO TABS: 20.0000 mg | ORAL_TABLET | Freq: Every day | ORAL | 1 refills | Status: DC

## 2018-05-17 MED ORDER — FLUTICASONE PROPIONATE 50 MCG/ACT NA SUSP: 2.0000 | Freq: Every day | NASAL | 2 refills | Status: DC

## 2018-05-17 MED FILL — CITALOPRAM HBR 20 MG TABLET: 20 | 90 days supply | Qty: 90 | Fill #0

## 2018-05-17 MED FILL — FLUTICASONE PROP 50 MCG SPR: 50 | 30 days supply | Qty: 16 | Fill #0

## 2018-05-17 MED FILL — AMOX-CLAV 875-125 MG TABLET: 875-125 | 10 days supply | Qty: 20 | Fill #0

## 2018-05-17 NOTE — Progress Notes (Signed)
Rebecca Russo Rebecca Russo is a 43 y.o. female here to Establish Care.  I acted as a Neurosurgeonscribe for Energy East CorporationSamantha Trevell Pariseau, PA-C Corky Mullonna Orphanos, LPN  History of Present Illness:   Chief Complaint  Rebecca Russo presents with  . Establish Care  . Cough    Acute Concerns: Depression -- started after Rebecca Russo had Rebecca Russo stroke. Taking Celexa but Rebecca Russo is not sure which dose Rebecca Russo is taking. Denies current SI/HI. Rebecca Russo is eating and sleeping well. Cough and sinus congestion -- has been dealing with this for about 4-5 weeks. Has improved with time. Not taking anything for Rebecca Russo symptoms. Had fevers at first. Cough is all throughout the day. No heartburn currently. Denies SOB. CVA -- history of this approx 4 years ago, was seeing Dr. Roda ShuttersXu but Rebecca Russo states that he is now inpatient only.   Thinking about Bariatric Surgery -- may do in March.   Health Maintenance: Immunizations -- UTD Colonoscopy -- N/A Mammogram -- Overdue PAP -- 09/19/17 -- normal Bone Density -- N/A Weight -- Weight: (!) 364 lb (165.1 kg)   [facility] 05/17/2018  Decreased Interest 0  Down, Depressed, Hopeless 0  PHQ - 2 Score 0  Altered sleeping 2  Tired, decreased energy 2  Change in appetite 0  Feeling bad or failure about yourself  0  Trouble concentrating 2  Moving slowly or fidgety/restless 3  Suicidal thoughts 0  PHQ-9 Score 9  Difficult doing work/chores Not difficult at all    No flowsheet data found.   Other providers/specialists: Rebecca Russo Care Team: Jarold MottoWorley, Jai Steil, [place] as PCP - General (Physician Assistant) Tonny Bollmanooper, Michael, MD as PCP - Cardiology (Cardiology)   Past Medical History:  Diagnosis Date  . Aphasia complicating stroke   . Bilateral swelling of feet   . Chronic systolic heart failure (HCC) 06/09/2016   a - Prob nonischemic //  Echo 10/17: EF 40-45, diffuse HK, mild LAE, PFO closure device without residual shunt  //  Could not tol cMRI // Echo 4/18: EF 55, normal wall motion, normal diastolic function, trivial MR, PFO  device in place without residual shunt, PASP 24  . Constipation   . Eczema   . H/O varicella   . Hemiplegia affecting dominant side (HCC)   . History of ischemic left MCA stroke   . Increased BMI 12/17/08  . PFO with atrial septal aneurysm 06/04/2014   a - s/p transcatheter closure in 2/16 with 25 mm Amplatzer Cribiform ASO device // b - Echo 10/17:  EF 40-45, diffuse HK, mild LAE, PFO closure device without residual shunt   . Stroke (cerebrum) [facility](HCC)      Social History   Socioeconomic History  . Marital status: Single    Spouse name: Not on file  . Number of children: 2  . Years of education: Associates  . Highest education level: Not on file  Occupational History  . Occupation: Radiology Scheduler  Social Needs  . Financial resource strain: Not on file  . Food insecurity:    Worry: Not on file    Inability: Not on file  . Transportation needs:    Medical: Not on file    Non-medical: Not on file  Tobacco Use  . Smoking status: Never Smoker  . Smokeless tobacco: Never Used  Substance and Sexual Activity  . Alcohol use: No    Alcohol/week: 0.0 standard drinks  . Drug use: No  . Sexual activity: Not Currently  Lifestyle  . Physical activity:    Days per  week: Not on file    Minutes per session: Not on file  . Stress: Not on file  Relationships  . Social connections:    Talks on phone: Not on file    Gets together: Not on file    Attends religious service: Not on file    Active member of club or organization: Not on file    Attends meetings of clubs or organizations: Not on file    Relationship status: Not on file  . Intimate partner violence:    Fear of current or ex partner: Not on file    Emotionally abused: Not on file    Physically abused: Not on file    Forced sexual activity: Not on file  Other Topics Concern  . Not on file  Social History Narrative   Rebecca Russo is single with 2 children --> 25 and 20, both in college. Daughter is at [employer] studying nursing. Son  at [employer].   Rebecca Russo is right handed.   Rebecca Russo has Rebecca Russo [employer] degree.   Rebecca Russo drinks 1 soda a month.   Worked in radiology scheduling    Past Surgical History:  Procedure Laterality Date  . CESAREAN SECTION  1999  . COLON SURGERY  2004  . LAPAROSCOPIC CHOLECYSTECTOMY  06/20/2012  . PATENT FORAMEN OVALE CLOSURE  07/09/2014  . PATENT FORAMEN OVALE CLOSURE N/A 07/09/2014   Procedure: PATENT FORAMEN OVALE CLOSURE;  Surgeon: Micheline Chapman, MD;  Location: [facility] CATH LAB;  Service: Cardiovascular;  Laterality: N/A;  . RADIOLOGY WITH ANESTHESIA N/A 05/29/2014   Procedure: RADIOLOGY WITH ANESTHESIA;  Surgeon: Oneal Grout, MD;  Location: [facility] OR;  Service: Radiology;  Laterality: N/A;  . TEE WITHOUT CARDIOVERSION N/A 06/03/2014   Procedure: TRANSESOPHAGEAL ECHOCARDIOGRAM (TEE);  Surgeon: Wendall Stade, MD;  Location: [facility] ENDOSCOPY;  Service: Cardiovascular;  Laterality: N/A;    Family History  Problem Relation Age of Onset  . Hypertension Mother   . Diabetes Mother   . Heart disease Mother   . Sleep apnea Mother   . Hypertension Father   . Stroke Father   . Diabetes Father   . Heart disease Father   . Stroke Paternal Grandfather   . Stroke Maternal Grandfather   . Cancer Other   . Breast cancer Neg Hx   . Colon cancer Neg Hx     Allergies  Allergen Reactions  . No Known Allergies      Current Medications:   Current Outpatient Medications:  .  [name] 0.35 MG tablet, Take 1 tablet (0.35 mg total) by mouth daily., Disp: 1 Package, Rfl: 11 .  Citalopram Hydrobromide (CELEXA PO), Take by mouth as needed., Disp: , Rfl:  .  clopidogrel (PLAVIX) 75 MG tablet, Take 1 tablet (75 mg total) by mouth daily., Disp: 30 tablet, Rfl: 0 .  furosemide (LASIX) 20 MG tablet, Take 1 tablet (20 mg total) by mouth daily., Disp: 30 tablet, Rfl: 11 .  potassium chloride (K-DUR) 10 MEQ tablet, Take 1 tablet (10 mEq total) by mouth daily., Disp: 30 tablet, Rfl: 11 .  rosuvastatin (CRESTOR) 5  MG tablet, Take 1 tablet (5 mg total) by mouth daily. Please call and make an appt for further refills. 1st attempt, Disp: 30 tablet, Rfl: 0 .  amoxicillin-clavulanate (AUGMENTIN) 875-125 MG tablet, Take 1 tablet by mouth 2 (two) times daily., Disp: 20 tablet, Rfl: 0 .  fluticasone (FLONASE) 50 MCG/ACT nasal spray, Place 2 sprays into both nostrils daily., Disp: 16 g, Rfl: 2  Review of Systems:   Review of Systems  Constitutional: Negative.  Negative for chills, fever, malaise/fatigue and weight loss.  HENT: Positive for congestion and sore throat. Negative for hearing loss and sinus pain.   Eyes: Negative.  Negative for blurred vision.  Respiratory: Positive for cough (x 1 month). Negative for shortness of breath and wheezing.   Cardiovascular: Negative for chest pain and palpitations. Leg swelling: Takes Lasix.  Gastrointestinal: Negative.  Negative for abdominal pain, constipation, diarrhea, heartburn, nausea and vomiting.  Genitourinary: Negative.  Negative for dysuria, frequency and urgency.  Musculoskeletal: Negative.  Negative for back pain, myalgias and neck pain.  Skin: Negative.  Negative for itching and rash.  Neurological: Positive for headaches (x 2-3 months). Negative for dizziness, tingling, seizures and loss of consciousness.  Endo/Heme/Allergies: Negative.  Negative for polydipsia.  Psychiatric/Behavioral: Negative for depression. The Rebecca Russo is not nervous/anxious.     Vitals:   Vitals:   05/17/18 0835  BP: 110/80  Pulse: 82  Temp: 98.5 F (36.9 C)  TempSrc: Oral  SpO2: 100%  Weight: (!) 364 lb (165.1 kg)  Height: 5\' 10"  (1.778 m)      Body mass index is 52.23 kg/m.  Physical Exam:   Physical Exam Vitals signs and nursing note reviewed.  Constitutional:      General: Rebecca Russo is not in acute distress.    Appearance: Rebecca Russo is well-developed. Rebecca Russo is not ill-appearing or toxic-appearing.  Cardiovascular:     Rate and Rhythm: Normal rate and regular rhythm.      Pulses: Normal pulses.     Heart sounds: Normal heart sounds, S1 normal and S2 normal.     Comments: No LE edema Pulmonary:     Effort: Pulmonary effort is normal.     Breath sounds: Normal breath sounds.  Skin:    General: Skin is warm and dry.  Neurological:     Mental Status: Rebecca Russo is alert.     GCS: GCS eye subscore is 4. GCS verbal subscore is 5. GCS motor subscore is 6.  Psychiatric:        Speech: Speech is delayed.        Behavior: Behavior normal. Behavior is cooperative.      Assessment and Plan:   Cerinity was seen today for establish care and cough.  Diagnoses and all orders for this visit:  Cerebrovascular accident (CVA) due to embolism of left middle cerebral artery [facility]) Referral to [facility] neurology.  Cough No red flags on exam.  4 week history -- suspect sinusitis. Will initiate Augmentin and Flonase per orders. Discussed taking medications as prescribed. Reviewed return precautions including worsening fever, SOB, worsening cough or other concerns. Push fluids and rest. I recommend that Rebecca Russo follow-up if symptoms worsen or persist despite treatment x 7-10 days, sooner if needed.  Depression, major, single episode, mild (HCC) Rebecca Russo is unsure of dosage of Celexa. Rebecca Russo is going to send [place] a MyChart message to confirm dosage so we can send this in for Rebecca Russo. I discussed with Rebecca Russo that if they develop any SI, to tell someone immediately and seek medical attention.  Other orders -     amoxicillin-clavulanate (AUGMENTIN) 875-125 MG tablet; Take 1 tablet by mouth 2 (two) times daily. -     fluticasone (FLONASE) 50 MCG/ACT nasal spray; Place 2 sprays into both nostrils daily.   . Reviewed expectations re: course of current medical issues. . Discussed self-management of symptoms. . Outlined signs and symptoms indicating need for more acute intervention. Marland Kitchen  Rebecca Russo verbalized understanding and all questions were answered. . See orders for this visit as documented in the  electronic medical record. . Rebecca Russo received an After-Visit Summary.  CMA or LPN served as scribe during this visit. History, Physical, and Plan performed by medical provider. The above documentation has been reviewed and is accurate and complete.  Jarold MottoSamantha Tiphany Fayson, PA-C

## 2018-05-17 NOTE — Patient Instructions (Addendum)
It was great to see you!  1. Message [place] with the dosage of your celexa medication so we can send that in for you 2. Message [place] with the name of the ob-gyn that you will be seeing 3. Schedule a mammogram 4. You will be contacted about your referral to [facility] Neurology  Start augmentin and flonase for your symptoms.  Push fluids and get plenty of rest. Please return if you are not improving as expected, or if you have high fevers (>101.5) or difficulty swallowing or worsening productive cough.  Call clinic with questions.  I hope you start feeling better soon!

## 2018-05-24 ENCOUNTER — Other Ambulatory Visit: Payer: Self-pay | Admitting: Physician Assistant

## 2018-05-24 ENCOUNTER — Encounter: Payer: Self-pay | Admitting: Obstetrics and Gynecology

## 2018-05-24 ENCOUNTER — Ambulatory Visit (INDEPENDENT_AMBULATORY_CARE_PROVIDER_SITE_OTHER): Payer: 59 | Admitting: Obstetrics and Gynecology

## 2018-05-24 ENCOUNTER — Other Ambulatory Visit: Payer: Self-pay

## 2018-05-24 VITALS — BP 122/76 | HR 70 | Resp 22 | Ht 70.0 in | Wt 361.6 lb

## 2018-05-24 DIAGNOSIS — Z113 Encounter for screening for infections with a predominantly sexual mode of transmission: Secondary | ICD-10-CM | POA: Diagnosis not present

## 2018-05-24 DIAGNOSIS — Z1231 Encounter for screening mammogram for malignant neoplasm of breast: Secondary | ICD-10-CM

## 2018-05-24 DIAGNOSIS — Z3009 Encounter for other general counseling and advice on contraception: Secondary | ICD-10-CM

## 2018-05-24 DIAGNOSIS — Z01419 Encounter for gynecological examination (general) (routine) without abnormal findings: Secondary | ICD-10-CM | POA: Diagnosis not present

## 2018-05-24 NOTE — Progress Notes (Signed)
43 y.o. Rebecca Russo Single African American female here for annual exam.     Patient would like STD testing.  Hx Mirena IUD usage. IUD removed 09/19/17 per patient choice and she then started Micronor.  She had heard stories about IUD problems of uterine perforation and failure of the IUD.  She did not have her menstrual cycles with Mirena.  She now does not like the Micronor because she is having cycles again.  They are not moderate and not painful.   Considering weight loss surgery with [facility].  Had partial colectomy for peristalsis problem.  This is now corrected.  On disability.   PCP:  [name], PA  Patient's last menstrual period was 05/03/2018 (exact date).     Period Cycle (Days): 30 Period Duration (Days): 4-5 days Period Pattern: Regular Menstrual Flow: Moderate Menstrual Control: Maxi pad Menstrual Control Change Freq (Hours): every 2-3 hours on heaviest day--more for cleanliness Dysmenorrhea: None     Sexually active: No. female The current method of family planning is oral progesterone-only contraceptive.    Exercising: Yes.    works out at gym. Smoker:  no  Health Maintenance: Pap: 09-19-17 Neg:Neg HR HPV - PCP History of abnormal Pap:  no MMG: 2018 normal per patient--at GYN office Colonoscopy:  20 years ago -- constipation/abd.pain--had partial colectomy BMD:  n/a  Result  n/a TDaP: 2019 Gardasil:   no HIV: no Hep C:no Screening Labs:  Hb today: PCP. Flu vaccine:  October.   reports that she has never smoked. She has never used smokeless tobacco. She reports that she does not drink alcohol or use drugs.  Past Medical History:  Diagnosis Date  . Aphasia complicating stroke   . Bilateral swelling of feet   . Chronic systolic heart failure (HCC) 06/09/2016   a - Prob nonischemic //  Echo 10/17: EF 40-45, diffuse HK, mild LAE, PFO closure device without residual shunt  //  Could not tol cMRI // Echo 4/18: EF 55, normal wall motion,  normal diastolic function, trivial MR, PFO device in place without residual shunt, PASP 24  . Constipation   . Eczema   . H/O varicella   . Hemiplegia affecting dominant side (HCC)   . History of ischemic left MCA stroke   . Increased BMI 12/17/08  . PFO with atrial septal aneurysm 06/04/2014   a - s/p transcatheter closure in 2/16 with 25 mm Amplatzer Cribiform ASO device // b - Echo 10/17:  EF 40-45, diffuse HK, mild LAE, PFO closure device without residual shunt   . Stroke (cerebrum) [facility])     Past Surgical History:  Procedure Laterality Date  . CESAREAN SECTION  1999  . COLON SURGERY  2004   partial colectomy  . LAPAROSCOPIC CHOLECYSTECTOMY  06/20/2012  . PATENT FORAMEN OVALE CLOSURE  07/09/2014  . PATENT FORAMEN OVALE CLOSURE N/A 07/09/2014   Procedure: PATENT FORAMEN OVALE CLOSURE;  Surgeon: Micheline Chapman, MD;  [facility] CATH LAB;  Service: Cardiovascular;  Laterality: N/A;  . RADIOLOGY WITH ANESTHESIA N/A 05/29/2014   Procedure: RADIOLOGY WITH ANESTHESIA;  Surgeon: Oneal Grout, MD;  Location: [facility] OR;  Service: Radiology;  Laterality: N/A;  . TEE WITHOUT CARDIOVERSION N/A 06/03/2014   Procedure: TRANSESOPHAGEAL ECHOCARDIOGRAM (TEE);  Surgeon: Wendall Stade, MD;  Location: [facility] ENDOSCOPY;  Service: Cardiovascular;  Laterality: N/A;    Current Outpatient Medications  Medication Sig Dispense Refill  . amoxicillin-clavulanate (AUGMENTIN) 875-125 MG tablet Take 1 tablet by mouth 2 (two) times  daily. 20 tablet 0  . [name] 0.35 MG tablet Take 1 tablet (0.35 mg total) by mouth daily. 1 Package 11  . citalopram (CELEXA) 20 MG tablet Take 1 tablet (20 mg total) by mouth daily. 90 tablet 1  . clopidogrel (PLAVIX) 75 MG tablet Take 1 tablet (75 mg total) by mouth daily. 30 tablet 0  . furosemide (LASIX) 20 MG tablet Take 1 tablet (20 mg total) by mouth daily. 30 tablet 11  . potassium chloride (K-DUR) 10 MEQ tablet Take 1 tablet (10 mEq total) by mouth daily. 30 tablet 11  .  rosuvastatin (CRESTOR) 5 MG tablet Take 1 tablet (5 mg total) by mouth daily. Please call and make an appt for further refills. 1st attempt 30 tablet 0   No current facility-administered medications for this visit.     Family History  Problem Relation Age of Onset  . Hypertension Mother   . Diabetes Mother   . Heart disease Mother   . Sleep apnea Mother   . Hypertension Father   . Stroke Father   . Diabetes Father   . Heart disease Father   . Stroke Paternal Grandfather   . Stroke Maternal Grandfather   . Cancer Other   . Breast cancer Neg Hx   . Colon cancer Neg Hx     Review of Systems  All other systems reviewed and are negative.   Exam:   BP 122/76 (BP Location: Right Arm, Patient Position: Sitting, Cuff Size: Large)   Pulse 70   Resp (!) 22   Ht 5\' 10"  (1.778 m)   Wt (!) 361 lb 9.6 oz (164 kg)   LMP 05/03/2018 (Exact Date)   BMI 51.88 kg/m     General appearance: alert, cooperative and appears stated age Head: Normocephalic, without obvious abnormality, atraumatic Neck: no adenopathy, supple, symmetrical, trachea midline and thyroid normal to inspection and palpation Lungs: clear to auscultation bilaterally Breasts: normal appearance, no masses or tenderness, No nipple retraction or dimpling, No nipple discharge or bleeding, No axillary or supraclavicular adenopathy Heart: regular rate and rhythm Abdomen: soft, non-tender; no masses, no organomegaly Extremities: extremities normal, atraumatic, no cyanosis or edema Skin: Skin color, texture, turgor normal. No rashes or lesions Lymph nodes: Cervical, supraclavicular, and axillary nodes normal. No abnormal inguinal nodes palpated Neurologic: Grossly normal  Pelvic: External genitalia:  no lesions              Urethra:  normal appearing urethra with no masses, tenderness or lesions              Bartholins and Skenes: normal                 Vagina: normal appearing vagina with normal color and discharge, no  lesions              Cervix: no lesions              Pap taken: No. Bimanual Exam:  Uterus:  normal size, contour, position, consistency, mobility, non-tender              Adnexa: no mass, fullness, tenderness              Rectal exam: Yes.  .  Confirms.              Anus:  normal sphincter tone, no lesions  Chaperone was present for exam.  Assessment:   Well woman visit with normal exam. Status post CVA with hemiplegia and aphasia.  Status post Foramen ovale closure. Status post partial colectomy.  Benign.  Contraceptive counseling.   Plan: Mammogram screening. Recommended self breast awareness. Pap and HR HPV as above. Guidelines for Calcium, Vitamin D, regular exercise program including cardiovascular and weight bearing exercise. We reviewed Micronor and Mirena.  She would like to use Mirena again.  Will precert.  STD screening.  Follow up annually and prn.     After visit summary provided.

## 2018-05-24 NOTE — Patient Instructions (Signed)

## 2018-05-25 LAB — HEPATITIS C ANTIBODY: HEP C VIRUS AB: 0.1 {s_co_ratio} (ref 0.0–0.9)

## 2018-05-25 LAB — HEP, RPR, HIV PANEL
HEP B S AG: NEGATIVE
HIV Screen 4th Generation wRfx: NONREACTIVE
RPR: NONREACTIVE

## 2018-05-26 LAB — CHLAMYDIA/GONOCOCCUS/TRICHOMONAS, NAA
CHLAMYDIA BY NAA: NEGATIVE
GONOCOCCUS BY NAA: NEGATIVE
Trich vag by NAA: NEGATIVE

## 2018-05-28 ENCOUNTER — Telehealth: Payer: Self-pay | Admitting: Obstetrics and Gynecology

## 2018-05-28 NOTE — Telephone Encounter (Signed)
Spoke with patient and conveyed benefits for Mirena. Patient understands and is agreeable with the benefits. Patient has questions regarding her cycle and scheduling the Mirena.

## 2018-05-28 NOTE — Telephone Encounter (Signed)
Call placed to convey benefits for Mirena. °

## 2018-05-28 NOTE — Telephone Encounter (Signed)
Left message to call Wilmarie Sparlin at [phone]. 

## 2018-05-29 NOTE — Telephone Encounter (Signed)
Spoke with patient. Patient request to schedule Mirena IUD insertion. LMP 05/03/18. POP for contraceptive.   IUD insertion scheduled for 1/22 at 0am with Dr. Edward Jolly. Advised to take Motrin 800 mg with food and water one hour before procedure. Continue POP until IUD inserted, return call to office if any chance of pregnancy.   Routing to provider for final review. Patient is agreeable to disposition. Will close encounter.   Cc: Soundra Pilon

## 2018-06-03 NOTE — Progress Notes (Signed)
GYNECOLOGY  VISIT   HPI: 43 y.o.   Single  African American  female   [phone] with Patient's last menstrual period was 05/30/2018 (exact date).   here for Mirena IUD insertion.  On Plavix.   UPT: Neg   GYNECOLOGIC HISTORY: Patient's last menstrual period was 05/30/2018 (exact date). Contraception: Micronor Menopausal hormone therapy:  n/a Last mammogram: 2018 normal per patient--at GYN office--appt. 06-26-18 Last pap smear:  09-19-17 Neg:Neg HR HPV         OB History    Gravida  2   Para  2   Term  2   Preterm      AB      Living  2     SAB      TAB      Ectopic      Multiple      Live Births  2              Patient Active Problem List   Diagnosis Date Noted  . Chronic systolic heart failure (HCC) 06/09/2016  . Depression 02/02/2015  . Morbid obesity (HCC) 06/04/2014  . Dysphagia, pharyngoesophageal phase 06/04/2014  . PFO with atrial septal aneurysm 06/04/2014  . Hyperlipidemia LDL goal <70 06/04/2014  . Ischemic brain damage   . Acute respiratory failure with hypoxia (HCC)   . Cerebrovascular accident (CVA) due to embolism of left middle cerebral artery (HCC) 05/29/2014  . Aphasia complicating stroke 05/29/2014  . Hemiplegia affecting dominant side (HCC) 05/29/2014  . CONSTIPATION 03/09/2008  . IRRITABLE BOWEL SYNDROME 03/09/2008    Past Medical History:  Diagnosis Date  . Aphasia complicating stroke   . Bilateral swelling of feet   . Chronic systolic heart failure (HCC) 06/09/2016   a - Prob nonischemic //  Echo 10/17: EF 40-45, diffuse HK, mild LAE, PFO closure device without residual shunt  //  Could not tol cMRI // Echo 4/18: EF 55, normal wall motion, normal diastolic function, trivial MR, PFO device in place without residual shunt, PASP 24  . Constipation   . Eczema   . H/O varicella   . Hemiplegia affecting dominant side (HCC)   . History of ischemic left MCA stroke   . Increased BMI 12/17/08  . PFO with atrial septal aneurysm 06/04/2014    a - s/p transcatheter closure in 2/16 with 25 mm Amplatzer Cribiform ASO device // b - Echo 10/17:  EF 40-45, diffuse HK, mild LAE, PFO closure device without residual shunt   . Stroke (cerebrum) [facility](HCC)     Past Surgical History:  Procedure Laterality Date  . CESAREAN SECTION  1999  . COLON SURGERY  2004   partial colectomy  . LAPAROSCOPIC CHOLECYSTECTOMY  06/20/2012  . PATENT FORAMEN OVALE CLOSURE  07/09/2014  . PATENT FORAMEN OVALE CLOSURE N/A 07/09/2014   Procedure: PATENT FORAMEN OVALE CLOSURE;  Surgeon: Micheline ChapmanMichael D Cooper, MD;  [facility];  Service: Cardiovascular;  Laterality: N/A;  . RADIOLOGY WITH ANESTHESIA N/A 05/29/2014   Procedure: RADIOLOGY WITH ANESTHESIA;  Surgeon: Oneal GroutSanjeev K Deveshwar, MD;  Location: [facility] OR;  Service: Radiology;  Laterality: N/A;  . TEE WITHOUT CARDIOVERSION N/A 06/03/2014   Procedure: TRANSESOPHAGEAL ECHOCARDIOGRAM (TEE);  Surgeon: Wendall StadePeter C Nishan, MD;  [facility];  Service: Cardiovascular;  Laterality: N/A;    Current Outpatient Medications  Medication Sig Dispense Refill  . [name] 0.35 MG tablet Take 1 tablet (0.35 mg total) by mouth daily. 1 Package 11  . citalopram (CELEXA) 20 MG tablet  Take 1 tablet (20 mg total) by mouth daily. 90 tablet 1  . clopidogrel (PLAVIX) 75 MG tablet Take 1 tablet (75 mg total) by mouth daily. 30 tablet 0  . furosemide (LASIX) 20 MG tablet Take 1 tablet (20 mg total) by mouth daily. 30 tablet 11  . potassium chloride (K-DUR) 10 MEQ tablet Take 1 tablet (10 mEq total) by mouth daily. 30 tablet 11  . rosuvastatin (CRESTOR) 5 MG tablet Take 1 tablet (5 mg total) by mouth daily. Please call and make an appt for further refills. 1st attempt 30 tablet 0   No current facility-administered medications for this visit.      ALLERGIES: No known allergies  Family History  Problem Relation Age of Onset  . Hypertension Mother   . Diabetes Mother   . Heart disease Mother   . Sleep apnea Mother   . Hypertension  Father   . Stroke Father   . Diabetes Father   . Heart disease Father   . Stroke Paternal Grandfather   . Stroke Maternal Grandfather   . Cancer Other   . Breast cancer Neg Hx   . Colon cancer Neg Hx     Social History   Socioeconomic History  . Marital status: Single    Spouse name: Not on file  . Number of children: 2  . Years of education: Associates  . Highest education level: Not on file  Occupational History  . Occupation: Radiology Scheduler  Social Needs  . Financial resource strain: Not on file  . Food insecurity:    Worry: Not on file    Inability: Not on file  . Transportation needs:    Medical: Not on file    Non-medical: Not on file  Tobacco Use  . Smoking status: Never Smoker  . Smokeless tobacco: Never Used  Substance and Sexual Activity  . Alcohol use: No    Alcohol/week: 0.0 standard drinks  . Drug use: No  . Sexual activity: Not Currently    Birth control/protection: Abstinence, Pill    Comment: [name]  Lifestyle  . Physical activity:    Days per week: Not on file    Minutes per session: Not on file  . Stress: Not on file  Relationships  . Social connections:    Talks on phone: Not on file    Gets together: Not on file    Attends religious service: Not on file    Active member of club or organization: Not on file    Attends meetings of clubs or organizations: Not on file    Relationship status: Not on file  . Intimate partner violence:    Fear of current or ex partner: Not on file    Emotionally abused: Not on file    Physically abused: Not on file    Forced sexual activity: Not on file  Other Topics Concern  . Not on file  Social History Narrative   Patient is single with 2 children --> 25 and 20, both in college. Daughter is at [employer] studying nursing. Son at [employer].   Patient is right handed.   Patient has her Associates degree.   Patient drinks 1 soda a month.   Worked in radiology scheduling    Review of Systems  All  negative.   PHYSICAL EXAMINATION:    BP 130/82 (BP Location: Right Arm, Patient Position: Sitting, Cuff Size: Large)   Pulse 80   Ht 5\' 10"  (1.778 m)   Wt (!) 361 lb (  163.7 kg)   LMP 05/30/2018 (Exact Date)   BMI 51.80 kg/m     General appearance: alert, cooperative and appears stated age   Pelvic: External genitalia:  no lesions              Urethra:  normal appearing urethra with no masses, tenderness or lesions              Bartholins and Skenes: normal                 Vagina: normal appearing vagina with normal color and discharge, no lesions              Cervix: no lesions                Bimanual Exam:  Uterus:  normal size, contour, position, consistency, mobility, non-tender              Adnexa: no mass, fullness, tenderness              Consent for mirena IUD insertion.  Lot  [id number]     , expiration  Jan 2022. Marland Kitchen Speculum placed in vagina.  Sterile prep of cervix with Hibiclens.  Tenaculum to anterior cervical lip.  Uterus sounded to   8  cm.  Mirena IUD placed without difficulty.  Strings trimmed and shown to patient.  Repeat bimanual exam, no change. No complications.  Minimal EBL. Minor bleeding at tenaculum sites, treated with pressure from large swab Q tips.   ASSESSMENT  IUD insertion.   PLAN  Instructions and precautions given.  Back up contraception discussed. Card given to patient with insertion date, recommended removal date, and lot number.  Follow up for a recheck in 4 weeks, sooner as needed  An After Visit Summary was printed and given to the patient.

## 2018-06-05 ENCOUNTER — Ambulatory Visit (INDEPENDENT_AMBULATORY_CARE_PROVIDER_SITE_OTHER): Payer: 59 | Admitting: Obstetrics and Gynecology

## 2018-06-05 ENCOUNTER — Other Ambulatory Visit: Payer: Self-pay

## 2018-06-05 ENCOUNTER — Encounter: Payer: Self-pay | Admitting: Obstetrics and Gynecology

## 2018-06-05 ENCOUNTER — Ambulatory Visit: Payer: Self-pay

## 2018-06-05 VITALS — BP 130/82 | HR 80 | Ht 70.0 in | Wt 361.0 lb

## 2018-06-05 DIAGNOSIS — Z30430 Encounter for insertion of intrauterine contraceptive device: Secondary | ICD-10-CM | POA: Diagnosis not present

## 2018-06-05 DIAGNOSIS — Z3009 Encounter for other general counseling and advice on contraception: Secondary | ICD-10-CM

## 2018-06-05 DIAGNOSIS — Z01812 Encounter for preprocedural laboratory examination: Secondary | ICD-10-CM | POA: Diagnosis not present

## 2018-06-05 LAB — POCT URINE PREGNANCY: Preg Test, Ur: NEGATIVE

## 2018-06-05 NOTE — Patient Instructions (Signed)
Intrauterine Device Insertion, Care After    This sheet gives you information about how to care for yourself after your procedure. Your health care provider may also give you more specific instructions. If you have problems or questions, contact your health care provider.  What can I expect after the procedure?  After the procedure, it is common to have:  · Cramps and pain in the abdomen.  · Light bleeding (spotting) or heavier bleeding that is like your menstrual period. This may last for up to a few days.  · Lower back pain.  · Dizziness.  · Headaches.  · Nausea.  Follow these instructions at home:  · Before resuming sexual activity, check to make sure that you can feel the IUD string(s). You should be able to feel the end of the string(s) below the opening of your cervix. If your IUD string is in place, you may resume sexual activity.  ? If you had a hormonal IUD inserted more than 7 days after your most recent period started, you will need to use a backup method of birth control for 7 days after IUD insertion. Ask your health care provider whether this applies to you.  · Continue to check that the IUD is still in place by feeling for the string(s) after every menstrual period, or once a month.  · Take over-the-counter and prescription medicines only as told by your health care provider.  · Do not drive or use heavy machinery while taking prescription pain medicine.  · Keep all follow-up visits as told by your health care provider. This is important.  Contact a health care provider if:  · You have bleeding that is heavier or lasts longer than a normal menstrual cycle.  · You have a fever.  · You have cramps or abdominal pain that get worse or do not get better with medicine.  · You develop abdominal pain that is new or is not in the same area of earlier cramping and pain.  · You feel lightheaded or weak.  · You have abnormal or bad-smelling discharge from your vagina.  · You have pain during sexual  activity.  · You have any of the following problems with your IUD string(s):  ? The string bothers or hurts you or your sexual partner.  ? You cannot feel the string.  ? The string has gotten longer.  · You can feel the IUD in your vagina.  · You think you may be pregnant, or you miss your menstrual period.  · You think you may have an STI (sexually transmitted infection).  Get help right away if:  · You have flu-like symptoms.  · You have a fever and chills.  · You can feel that your IUD has slipped out of place.  Summary  · After the procedure, it is common to have cramps and pain in the abdomen. It is also common to have light bleeding (spotting) or heavier bleeding that is like your menstrual period.  · Continue to check that the IUD is still in place by feeling for the string(s) after every menstrual period, or once a month.  · Keep all follow-up visits as told by your health care provider. This is important.  · Contact your health care provider if you have problems with your IUD string(s), such as the string getting longer or bothering you or your sexual partner.  This information is not intended to replace advice given to you by your health care provider. Make   sure you discuss any questions you have with your health care provider.  Document Released: 12/28/2010 Document Revised: 03/22/2016 Document Reviewed: 03/22/2016  Elsevier Interactive Patient Education © 2019 [employer]

## 2018-06-06 ENCOUNTER — Ambulatory Visit (INDEPENDENT_AMBULATORY_CARE_PROVIDER_SITE_OTHER): Payer: Self-pay | Admitting: Family Medicine

## 2018-06-06 VITALS — BP 126/82 | HR 78 | Temp 98.7°F | Resp 18 | Wt 368.6 lb

## 2018-06-06 NOTE — Progress Notes (Signed)
Patient not seen- advised to follow up with PCP or at a location with greater diagnostic capabilities. Patient has had symptoms since November 2019 and already completed a round of Augmentin without relief. Concern for more comprehensive evaluation related to known chronic health conditions of lower extremity swelling, hx of stroke and HF. This was explained to patient who verbalized understanding of information provided about appropriate treatment locations.

## 2018-06-13 ENCOUNTER — Ambulatory Visit (INDEPENDENT_AMBULATORY_CARE_PROVIDER_SITE_OTHER): Payer: 59 | Admitting: Physician Assistant

## 2018-06-13 ENCOUNTER — Encounter: Payer: Self-pay | Admitting: Physician Assistant

## 2018-06-13 ENCOUNTER — Ambulatory Visit (INDEPENDENT_AMBULATORY_CARE_PROVIDER_SITE_OTHER): Payer: 59

## 2018-06-13 VITALS — BP 120/80 | HR 82 | Temp 98.6°F | Ht 70.0 in | Wt 368.0 lb

## 2018-06-13 DIAGNOSIS — Q2112 Patent foramen ovale: Secondary | ICD-10-CM

## 2018-06-13 DIAGNOSIS — M25562 Pain in left knee: Secondary | ICD-10-CM

## 2018-06-13 DIAGNOSIS — Z0001 Encounter for general adult medical examination with abnormal findings: Secondary | ICD-10-CM | POA: Diagnosis not present

## 2018-06-13 DIAGNOSIS — I5022 Chronic systolic (congestive) heart failure: Secondary | ICD-10-CM | POA: Diagnosis not present

## 2018-06-13 DIAGNOSIS — Z13220 Encounter for screening for lipoid disorders: Secondary | ICD-10-CM

## 2018-06-13 DIAGNOSIS — R05 Cough: Secondary | ICD-10-CM

## 2018-06-13 DIAGNOSIS — M1712 Unilateral primary osteoarthritis, left knee: Secondary | ICD-10-CM | POA: Diagnosis not present

## 2018-06-13 DIAGNOSIS — Z136 Encounter for screening for cardiovascular disorders: Secondary | ICD-10-CM

## 2018-06-13 DIAGNOSIS — I63412 Cerebral infarction due to embolism of left middle cerebral artery: Secondary | ICD-10-CM | POA: Diagnosis not present

## 2018-06-13 DIAGNOSIS — I253 Aneurysm of heart: Secondary | ICD-10-CM

## 2018-06-13 DIAGNOSIS — Q211 Atrial septal defect: Secondary | ICD-10-CM

## 2018-06-13 DIAGNOSIS — R059 Cough, unspecified: Secondary | ICD-10-CM

## 2018-06-13 LAB — COMPREHENSIVE METABOLIC PANEL
ALBUMIN: 3.8 g/dL (ref 3.5–5.2)
ALT: 22 U/L (ref 0–35)
AST: 22 U/L (ref 0–37)
Alkaline Phosphatase: 78 U/L (ref 39–117)
BUN: 10 mg/dL (ref 6–23)
CALCIUM: 9.3 mg/dL (ref 8.4–10.5)
CHLORIDE: 105 meq/L (ref 96–112)
CO2: 27 mEq/L (ref 19–32)
Creatinine, Ser: 0.76 mg/dL (ref 0.40–1.20)
GFR: 100.56 mL/min (ref >60.00)
Glucose, Bld: 98 mg/dL (ref 70–99)
POTASSIUM: 4.2 meq/L (ref 3.5–5.1)
SODIUM: 138 meq/L (ref 135–145)
TOTAL PROTEIN: 7 g/dL (ref 6.0–8.3)
Total Bilirubin: 0.3 mg/dL (ref 0.2–1.2)

## 2018-06-13 LAB — CBC WITH DIFFERENTIAL/PLATELET
Basophils Absolute: 0.1 10*3/uL (ref 0.0–0.1)
Basophils Relative: 0.6 % (ref 0.0–3.0)
EOS PCT: 1.8 % (ref 0.0–5.0)
Eosinophils Absolute: 0.2 10*3/uL (ref 0.0–0.7)
HCT: 34.6 % — ABNORMAL LOW (ref 36.0–46.0)
Hemoglobin: 11.5 g/dL — ABNORMAL LOW (ref 12.0–15.0)
LYMPHS ABS: 4.1 10*3/uL — AB (ref 0.7–4.0)
Lymphocytes Relative: 35 % (ref 12.0–46.0)
MCHC: 33.2 g/dL (ref 30.0–36.0)
MCV: 89.8 fl (ref 78.0–100.0)
MONO ABS: 0.6 10*3/uL (ref 0.1–1.0)
Monocytes Relative: 5 % (ref 3.0–12.0)
NEUTROS PCT: 57.6 % (ref 43.0–77.0)
Neutro Abs: 6.8 10*3/uL (ref 1.4–7.7)
PLATELETS: 403 10*3/uL — AB (ref 150.0–400.0)
RBC: 3.86 Mil/uL — AB (ref 3.87–5.11)
RDW: 13.7 % (ref 11.5–15.5)
WBC: 11.8 10*3/uL — ABNORMAL HIGH (ref 4.0–10.5)

## 2018-06-13 LAB — LIPID PANEL
CHOLESTEROL: 170 mg/dL (ref 0–200)
HDL: 33.9 mg/dL — ABNORMAL LOW (ref >39.00)
LDL CALC: 101 mg/dL — AB (ref 0–99)
NonHDL: 136.43
TRIGLYCERIDES: 175 mg/dL — AB (ref 0.0–149.0)
Total CHOL/HDL Ratio: 5
VLDL: 35 mg/dL (ref 0.0–40.0)

## 2018-06-13 MED ORDER — BUDESONIDE-FORMOTEROL FUMARATE 160-4.5 MCG/ACT IN AERO: 2.0000 | INHALATION_SPRAY | Freq: Two times a day (BID) | RESPIRATORY_TRACT | 3 refills | Status: DC

## 2018-06-13 MED FILL — SYMBICORT 160-4.5 MCG INH: 160-4.5 | 30 days supply | Qty: 10 | Fill #0

## 2018-06-13 NOTE — Patient Instructions (Addendum)
It was great to see you!  Please go to the lab for blood work.   Our office will call you with your results unless you have chosen to receive results via MyChart.  If your blood work is normal we will follow-up each year for physicals and as scheduled for chronic medical problems.  For your cough: 1. May try plain mucinex. 2. May try delsym. 3. Use inhaler  For your knee: 1. May use compression with the ACE bandage as tolerated. 2. Paulla Fore will likely need to inject your knee, I have put in a referral.  If anything is abnormal we will treat accordingly and get you in for a follow-up.  Take care,  [facility] Maintenance, Female Adopting a healthy lifestyle and getting preventive care can go a long way to promote health and wellness. Talk with your health care provider about what schedule of regular examinations is right for you. This is a good chance for you to check in with your provider about disease prevention and staying healthy. In between checkups, there are plenty of things you can do on your own. Experts have done a lot of research about which lifestyle changes and preventive measures are most likely to keep you healthy. Ask your health care provider for more information. Weight and diet Eat a healthy diet  Be sure to include plenty of vegetables, fruits, low-fat dairy products, and lean protein.  Do not eat a lot of foods high in solid fats, added sugars, or salt.  Get regular exercise. This is one of the most important things you can do for your health. ? Most adults should exercise for at least 150 minutes each week. The exercise should increase your heart rate and make you sweat (moderate-intensity exercise). ? Most adults should also do strengthening exercises at least twice a week. This is in addition to the moderate-intensity exercise. Maintain a healthy weight  Body mass index (BMI) is a measurement that can be used to identify possible weight problems. It  estimates body fat based on height and weight. Your health care provider can help determine your BMI and help you achieve or maintain a healthy weight.  For females 3 years of age and older: ? A BMI below 18.5 is considered underweight. ? A BMI of 18.5 to 24.9 is normal. ? A BMI of 25 to 29.9 is considered overweight. ? A BMI of 30 and above is considered obese. Watch levels of cholesterol and blood lipids  You should start having your blood tested for lipids and cholesterol at 43 years of age, then have this test every 5 years.  You may need to have your cholesterol levels checked more often if: ? Your lipid or cholesterol levels are high. ? You are older than 44 years of age. ? You are at high risk for heart disease. Cancer screening Lung Cancer  Lung cancer screening is recommended for adults 80-51 years old who are at high risk for lung cancer because of a history of smoking.  A yearly low-dose CT scan of the lungs is recommended for people who: ? Currently smoke. ? Have quit within the past 15 years. ? Have at least a 30-pack-year history of smoking. A pack year is smoking an average of one pack of cigarettes a day for 1 year.  Yearly screening should continue until it has been 15 years since you quit.  Yearly screening should stop if you develop a health problem that would prevent you from having lung  cancer treatment. Breast Cancer  Practice breast self-awareness. This means understanding how your breasts normally appear and feel.  It also means doing regular breast self-exams. Let your health care provider know about any changes, no matter how small.  If you are in your 20s or 30s, you should have a clinical breast exam (CBE) by a health care provider every 1-3 years as part of a regular health exam.  If you are 73 or older, have a CBE every year. Also consider having a breast X-ray (mammogram) every year.  If you have a family history of breast cancer, talk to your  health care provider about genetic screening.  If you are at high risk for breast cancer, talk to your health care provider about having an MRI and a mammogram every year.  Breast cancer gene (BRCA) assessment is recommended for women who have family members with BRCA-related cancers. BRCA-related cancers include: ? Breast. ? Ovarian. ? Tubal. ? Peritoneal cancers.  Results of the assessment will determine the need for genetic counseling and BRCA1 and BRCA2 testing. Cervical Cancer Your health care provider may recommend that you be screened regularly for cancer of the pelvic organs (ovaries, uterus, and vagina). This screening involves a pelvic examination, including checking for microscopic changes to the surface of your cervix (Pap test). You may be encouraged to have this screening done every 3 years, beginning at age 21.  For women ages 63-65, health care providers may recommend pelvic exams and Pap testing every 3 years, or they may recommend the Pap and pelvic exam, combined with testing for human papilloma virus (HPV), every 5 years. Some types of HPV increase your risk of cervical cancer. Testing for HPV may also be done on women of any age with unclear Pap test results.  Other health care providers may not recommend any screening for nonpregnant women who are considered low risk for pelvic cancer and who do not have symptoms. Ask your health care provider if a screening pelvic exam is right for you.  If you have had past treatment for cervical cancer or a condition that could lead to cancer, you need Pap tests and screening for cancer for at least 20 years after your treatment. If Pap tests have been discontinued, your risk factors (such as having a new sexual partner) need to be reassessed to determine if screening should resume. Some women have medical problems that increase the chance of getting cervical cancer. In these cases, your health care provider may recommend more frequent  screening and Pap tests. Colorectal Cancer  This type of cancer can be detected and often prevented.  Routine colorectal cancer screening usually begins at 43 years of age and continues through 43 years of age.  Your health care provider may recommend screening at an earlier age if you have risk factors for colon cancer.  Your health care provider may also recommend using home test kits to check for hidden blood in the stool.  A small camera at the end of a tube can be used to examine your colon directly (sigmoidoscopy or colonoscopy). This is done to check for the earliest forms of colorectal cancer.  Routine screening usually begins at age 55.  Direct examination of the colon should be repeated every 5-10 years through 43 years of age. However, you may need to be screened more often if early forms of precancerous polyps or small growths are found. Skin Cancer  Check your skin from head to toe regularly.  Tell your  health care provider about any new moles or changes in moles, especially if there is a change in a mole's shape or color.  Also tell your health care provider if you have a mole that is larger than the size of a pencil eraser.  Always use sunscreen. Apply sunscreen liberally and repeatedly throughout the day.  Protect yourself by wearing long sleeves, pants, a wide-brimmed hat, and sunglasses whenever you are outside. Heart disease, diabetes, and high blood pressure  High blood pressure causes heart disease and increases the risk of stroke. High blood pressure is more likely to develop in: ? People who have blood pressure in the high end of the normal range (130-139/85-89 mm Hg). ? People who are overweight or obese. ? People who are African American.  If you are 56-3 years of age, have your blood pressure checked every 3-5 years. If you are 60 years of age or older, have your blood pressure checked every year. You should have your blood pressure measured twice-once  when you are at a hospital or clinic, and once when you are not at a hospital or clinic. Record the average of the two measurements. To check your blood pressure when you are not at a hospital or clinic, you can use: ? An automated blood pressure machine at a pharmacy. ? A home blood pressure monitor.  If you are between 67 years and 68 years old, ask your health care provider if you should take aspirin to prevent strokes.  Have regular diabetes screenings. This involves taking a blood sample to check your fasting blood sugar level. ? If you are at a normal weight and have a low risk for diabetes, have this test once every three years after 43 years of age. ? If you are overweight and have a high risk for diabetes, consider being tested at a younger age or more often. Preventing infection Hepatitis B  If you have a higher risk for hepatitis B, you should be screened for this virus. You are considered at high risk for hepatitis B if: ? You were born in a country where hepatitis B is common. Ask your health care provider which countries are considered high risk. ? Your parents were born in a high-risk country, and you have not been immunized against hepatitis B (hepatitis B vaccine). ? You have HIV or AIDS. ? You use needles to inject street drugs. ? You live with someone who has hepatitis B. ? You have had sex with someone who has hepatitis B. ? You get hemodialysis treatment. ? You take certain medicines for conditions, including cancer, organ transplantation, and autoimmune conditions. Hepatitis C  Blood testing is recommended for: ? Everyone born from [birth year] through 1965. ? Anyone with known risk factors for hepatitis C. Sexually transmitted infections (STIs)  You should be screened for sexually transmitted infections (STIs) including gonorrhea and chlamydia if: ? You are sexually active and are younger than 43 years of age. ? You are older than 43 years of age and your health care  provider tells you that you are at risk for this type of infection. ? Your sexual activity has changed since you were last screened and you are at an increased risk for chlamydia or gonorrhea. Ask your health care provider if you are at risk.  If you do not have HIV, but are at risk, it may be recommended that you take a prescription medicine daily to prevent HIV infection. This is called pre-exposure prophylaxis (PrEP). You are  considered at risk if: ? You are sexually active and do not regularly use condoms or know the HIV status of your partner(s). ? You take drugs by injection. ? You are sexually active with a partner who has HIV. Talk with your health care provider about whether you are at high risk of being infected with HIV. If you choose to begin PrEP, you should first be tested for HIV. You should then be tested every 3 months for as long as you are taking PrEP. Pregnancy  If you are premenopausal and you may become pregnant, ask your health care provider about preconception counseling.  If you may become pregnant, take 400 to 800 micrograms (mcg) of folic acid every day.  If you want to prevent pregnancy, talk to your health care provider about birth control (contraception). Osteoporosis and menopause  Osteoporosis is a disease in which the bones lose minerals and strength with aging. This can result in serious bone fractures. Your risk for osteoporosis can be identified using a bone density scan.  If you are 57 years of age or older, or if you are at risk for osteoporosis and fractures, ask your health care provider if you should be screened.  Ask your health care provider whether you should take a calcium or vitamin D supplement to lower your risk for osteoporosis.  Menopause may have certain physical symptoms and risks.  Hormone replacement therapy may reduce some of these symptoms and risks. Talk to your health care provider about whether hormone replacement therapy is right  for you. Follow these instructions at home:  Schedule regular health, dental, and eye exams.  Stay current with your immunizations.  Do not use any tobacco products including cigarettes, chewing tobacco, or electronic cigarettes.  If you are pregnant, do not drink alcohol.  If you are breastfeeding, limit how much and how often you drink alcohol.  Limit alcohol intake to no more than 1 drink per day for nonpregnant women. One drink equals 12 ounces of beer, 5 ounces of wine, or 1 ounces of hard liquor.  Do not use street drugs.  Do not share needles.  Ask your health care provider for help if you need support or information about quitting drugs.  Tell your health care provider if you often feel depressed.  Tell your health care provider if you have ever been abused or do not feel safe at home. This information is not intended to replace advice given to you by your health care provider. Make sure you discuss any questions you have with your health care provider. Document Released: 11/14/2010 Document Revised: 10/07/2015 Document Reviewed: 02/02/2015 Elsevier Interactive Patient Education  2019 [employer].

## 2018-06-13 NOTE — Progress Notes (Signed)
I acted as a Neurosurgeon for [employer], PA-C [employer], LPN   Subjective:    Rebecca Russo is a 43 y.o. female and is here for a comprehensive physical exam.  Acute Concerns: Cough  This is a recurrent problem. Episode onset: Pt was seen on 1/3 for sinus infection. The problem has been gradually worsening. The cough is non-productive. Associated symptoms include nasal congestion and postnasal drip. Pertinent negatives include no chills, ear congestion, ear pain, fever, sore throat or shortness of breath. Associated symptoms comments: Sinus pressure. Treatments tried: Pt took antibiotic and is still not better, Nasal spray. The treatment provided no relief. There is no history of asthma, bronchitis or pneumonia.  L knee pain -- over the past week she has noticed increased pain to lateral L knee. No inciting event. Feels like her knee is going to give out when walking down stairs. Has not tried anything for sx. No numbness or tingling.  There are no preventive care reminders to display for this patient.  Chronic Issues: Obesity -- planning for surgery March (sleeve) with Dr. Johna Sheriff CVA -- planning to see Dr. Patrcia Dolly in March, was followed by Dr. [name] PFO /CHF -- followed by Dr. Excell Seltzer. Last echo in Nov 2019 with "stable findings with low normal LV systolic function and no significant valvular dysfunction."  Health Maintenance: Immunizations -- UTD Colonoscopy -- N/A Mammogram -- UTD,  Scheduled for 06/26/2018 PAP -- UTD, 09/2017 Normal Bone Density -- N/a Diet -- working on diet Caffeine intake -- does not drink excessive caffeine Sleep habits -- lots of worries at night, no concerns for sleep apnea; melatonin was helpful for a short of time; getting about 5 hours of sleep at night Exercise -- because of cough has had limited exercise Weight -- Weight: (!) 368 lb (166.9 kg)  Mood -- does have issues with anxiety at times Weight history: Wt Readings from Last 10  Encounters:  06/13/18 (!) 368 lb (166.9 kg)  06/06/18 (!) 368 lb 9.6 oz (167.2 kg)  06/05/18 (!) 361 lb (163.7 kg)  05/24/18 (!) 361 lb 9.6 oz (164 kg)  05/17/18 (!) 364 lb (165.1 kg)  04/03/18 (!) 361 lb (163.7 kg)  03/25/18 (!) 359 lb 11.2 oz (163.2 kg)  03/05/18 (!) 355 lb 14.4 oz (161.4 kg)  02/06/18 (!) 355 lb 3.2 oz (161.1 kg)  11/28/17 (!) 354 lb (160.6 kg)   Patient's last menstrual period was 05/30/2018 (exact date).   Depression screen [facility] 2/9 05/17/2018  Decreased Interest 0  Down, Depressed, Hopeless 0  PHQ - 2 Score 0  Altered sleeping 2  Tired, decreased energy 2  Change in appetite 0  Feeling bad or failure about yourself  0  Trouble concentrating 2  Moving slowly or fidgety/restless 3  Suicidal thoughts 0  PHQ-9 Score 9  Difficult doing work/chores Not difficult at all     Other providers/specialists: Patient Care Team: [name], [place] as PCP - General (Physician Assistant) Tonny Bollman, MD as PCP - Cardiology (Cardiology)    PMHx, SurgHx, SocialHx, Medications, and Allergies were reviewed in the Visit Navigator and updated as appropriate.   Past Medical History:  Diagnosis Date  . Aphasia complicating stroke   . Bilateral swelling of feet   . Chronic systolic heart failure (HCC) 06/09/2016   a - Prob nonischemic //  Echo 10/17: EF 40-45, diffuse HK, mild LAE, PFO closure device without residual shunt  //  Could not tol cMRI // Echo 4/18:  EF 55, normal wall motion, normal diastolic function, trivial MR, PFO device in place without residual shunt, PASP 24  . Constipation   . Eczema   . H/O varicella   . Hemiplegia affecting dominant side (HCC)   . History of ischemic left MCA stroke   . Increased BMI 12/17/08  . PFO with atrial septal aneurysm 06/04/2014   a - s/p transcatheter closure in 2/16 with 25 mm Amplatzer Cribiform ASO device // b - Echo 10/17:  EF 40-45, diffuse HK, mild LAE, PFO closure device without residual shunt   . Stroke  (cerebrum) [facility](HCC)      Past Surgical History:  Procedure Laterality Date  . CESAREAN SECTION  1999  . COLON SURGERY  2004   partial colectomy  . LAPAROSCOPIC CHOLECYSTECTOMY  06/20/2012  . PATENT FORAMEN OVALE CLOSURE  07/09/2014  . PATENT FORAMEN OVALE CLOSURE N/A 07/09/2014   Procedure: PATENT FORAMEN OVALE CLOSURE;  Surgeon: Micheline ChapmanMichael D Cooper, MD;  [facility];  Service: Cardiovascular;  Laterality: N/A;  . RADIOLOGY WITH ANESTHESIA N/A 05/29/2014   Procedure: RADIOLOGY WITH ANESTHESIA;  Surgeon: Oneal GroutSanjeev K Deveshwar, MD;  Location: [facility] OR;  Service: Radiology;  Laterality: N/A;  . TEE WITHOUT CARDIOVERSION N/A 06/03/2014   Procedure: TRANSESOPHAGEAL ECHOCARDIOGRAM (TEE);  Surgeon: Wendall StadePeter C Nishan, MD;  [facility];  Service: Cardiovascular;  Laterality: N/A;     Family History  Problem Relation Age of Onset  . Hypertension Mother   . Diabetes Mother   . Heart disease Mother   . Sleep apnea Mother   . Hypertension Father   . Stroke Father   . Diabetes Father   . Heart disease Father   . Stroke Paternal Grandfather   . Stroke Maternal Grandfather   . Cancer Other   . Breast cancer Neg Hx   . Colon cancer Neg Hx     Social History   Tobacco Use  . Smoking status: Never Smoker  . Smokeless tobacco: Never Used  Substance Use Topics  . Alcohol use: No    Alcohol/week: 0.0 standard drinks  . Drug use: No    Review of Systems:   Review of Systems  Constitutional: Negative for chills and fever.  HENT: Positive for postnasal drip. Negative for ear pain and sore throat.   Respiratory: Positive for cough. Negative for shortness of breath.     Objective:   BP 120/80 (BP Location: Left Arm, Patient Position: Sitting, Cuff Size: Large)   Pulse 82   Temp 98.6 F (37 C) (Oral)   Ht 5\' 10"  (1.778 m)   Wt (!) 368 lb (166.9 kg)   LMP 05/30/2018 (Exact Date)   SpO2 99%   BMI 52.80 kg/m  Body mass index is 52.8 kg/m.   General Appearance:    Alert,  cooperative, no distress, appears stated age  Head:    Normocephalic, without obvious abnormality, atraumatic  Eyes:    PERRL, conjunctiva/corneas clear, EOM's intact, fundi    benign, both eyes  Ears:    Normal TM's and external ear canals, both ears  Nose:   Nares normal, septum midline, mucosa normal, no drainage    or sinus tenderness  Throat:   Lips, mucosa, and tongue normal; teeth and gums normal  Neck:   Supple, symmetrical, trachea midline, no adenopathy;    thyroid:  no enlargement/tenderness/nodules; no carotid   bruit or JVD  Back:     Symmetric, no curvature, ROM normal, no CVA tenderness  Lungs:  Clear to auscultation bilaterally, respirations unlabored  Chest Wall:    No tenderness or deformity   Heart:    Regular rate and rhythm, S1 and S2 normal, no murmur, rub or gallop  Breast Exam:    Deferred  Abdomen:     Soft, non-tender, bowel sounds active all four quadrants,    no masses, no organomegaly  Genitalia:    Deferred  Extremities:   Extremities normal, atraumatic, no cyanosis or edema  L knee with tenderness to palpation of lateral portion of L knee; pain with extension of knee; no swelling appreciated on my exam; no calf pain or erythema/swelling  Pulses:   2+ and symmetric all extremities  Skin:   Skin color, texture, turgor normal, no rashes or lesions  Lymph nodes:   Cervical, supraclavicular, and axillary nodes normal  Neurologic:   CNII-XII intact, normal strength, sensation and reflexes    throughout   L knee xray pending  Assessment/Plan:   O[place]er was seen today for annual exam.  Diagnoses and all orders for this visit:  Encounter for general adult medical examination with abnormal findings Today patient counseled on age appropriate routine health concerns for screening and prevention, each reviewed and up to date or declined. Immunizations reviewed and up to date or declined. Labs ordered and reviewed. Risk factors for depression reviewed and  negative. Hearing function and visual acuity are intact. ADLs screened and addressed as needed. Functional ability and level of safety reviewed and appropriate. Education, counseling and referrals performed based on assessed risks today. Patient provided with a copy of personalized plan for preventive services.  Cough No red flags on exam. Suspect post viral cough. Discussed OTC medications such as mucinex and delsym. Will also trial symbicort -- coupon provided. Follow-up if symptoms persist. -     CBC with Differential/Platelet -     Comprehensive metabolic panel  Pain in lateral portion of left knee Xray today. Discussed RICE. Briefly discussed with Dr. Berline Choughigby -- likely will need injections, I have put in referral for him. 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  Chronic systolic heart failure (HCC) and PFO with atrial septal aneurysm Followed by cards.  Encounter for lipid screening for cardiovascular disease -     Lipid panel  Morbid obesity [facility](HCC) Planning for bariatric surgery with [facility] in March hopefully.  Other orders -     budesonide-formoterol (SYMBICORT) 160-4.5 MCG/ACT inhaler; Inhale 2 puffs into the lungs 2 (two) times daily.    Well Adult Exam: Labs ordered: Yes. Patient counseling was done. See below for items discussed. Discussed the patient's BMI.  The BMI is not in the acceptable range; BMI management plan is completed Follow up as needed for acute illness. Breast cancer screening: scheduled within the next two weeks. Cervical cancer screening: UTD   Patient Counseling: [x]    Nutrition: Stressed importance of moderation in sodium/caffeine intake, saturated fat and cholesterol, caloric balance, sufficient intake of fresh fruits, vegetables, fiber, calcium, iron, and 1 mg of folate supplement per day (for females capable of  pregnancy).  [x]    Stressed the importance of regular exercise.   [x]    Substance Abuse: Discussed cessation/primary prevention of tobacco, alcohol, or other drug use; driving or other dangerous activities under the influence; availability of treatment for abuse.   [x]    Injury  prevention: Discussed safety belts, safety helmets, smoke detector, smoking near bedding or upholstery.   [x]    Sexuality: Discussed sexually transmitted diseases, partner selection, use of condoms, avoidance of unintended pregnancy  and contraceptive alternatives.  [x]    Dental health: Discussed importance of regular tooth brushing, flossing, and dental visits.  [x]    Health maintenance and immunizations reviewed. Please refer to Health maintenance section.   CMA or LPN served as scribe during this visit. History, Physical, and Plan performed by medical provider. The above documentation has been reviewed and is accurate and complete.   [name], PA-C [facility]

## 2018-06-14 ENCOUNTER — Encounter: Payer: Self-pay | Admitting: Physician Assistant

## 2018-06-14 MED ORDER — ROSUVASTATIN CALCIUM 10 MG PO TABS: 10.0000 mg | ORAL_TABLET | Freq: Every day | ORAL | 1 refills | Status: DC

## 2018-06-14 MED FILL — ROSUVASTATIN CALCIUM 10 MG: 10 | 90 days supply | Qty: 90 | Fill #0

## 2018-06-20 ENCOUNTER — Ambulatory Visit (INDEPENDENT_AMBULATORY_CARE_PROVIDER_SITE_OTHER): Payer: 59 | Admitting: Sports Medicine

## 2018-06-20 ENCOUNTER — Encounter: Payer: Self-pay | Admitting: Sports Medicine

## 2018-06-20 ENCOUNTER — Ambulatory Visit: Payer: Self-pay

## 2018-06-20 ENCOUNTER — Ambulatory Visit (INDEPENDENT_AMBULATORY_CARE_PROVIDER_SITE_OTHER): Payer: 59 | Admitting: Psychiatry

## 2018-06-20 VITALS — BP 108/78 | HR 91 | Ht 70.0 in | Wt 366.4 lb

## 2018-06-20 DIAGNOSIS — M25562 Pain in left knee: Secondary | ICD-10-CM | POA: Diagnosis not present

## 2018-06-20 DIAGNOSIS — F509 Eating disorder, unspecified: Secondary | ICD-10-CM

## 2018-06-20 NOTE — Patient Instructions (Addendum)

## 2018-06-22 ENCOUNTER — Encounter: Payer: Self-pay | Admitting: Sports Medicine

## 2018-06-22 NOTE — Progress Notes (Addendum)
Rebecca Russo. Rebecca Russo Sports Medicine [facility] at [facility] [phone]  Rebecca Russo - 43 y.o. female MRN [id number]  Date of birth: 12-Apr-1976  Visit Date: 06/20/2018  PCP: [name], PA   Referred by: [name], [place]  SUBJECTIVE:   Chief Complaint  Patient presents with  . New Patient (Initial Visit)    L lateral knee pain x 2 weeks.  Ref. by Rebecca Russo.    HPI: Left lateral knee pain for the past 2 to 4 weeks.  This is been progressive in nature.  Feels like it is going to give out on her.  Going up and down steps is painful.  She is using a compression wrap.  She is not able to take anti-inflammatories due to a stroke.  X-rays were obtained that did show significant tricompartmental degenerative changes.  REVIEW OF SYSTEMS: The pain is awakening her at night and causing some left leg swelling and ankle edema.  No significant calf tenderness.  Otherwise 12 point review of systems is negative.  HISTORY:  Prior history reviewed and updated per electronic medical record.  Patient Active Problem List   Diagnosis Date Noted  . Left knee pain 06/20/2018    06/13/2018 XR L knee IMPRESSION: Tricompartmental degenerative spurring with mild to moderate medial compartment joint space loss. No acute osseous abnormality identified.   . Chronic systolic heart failure (HCC) 06/09/2016    a - Prob nonischemic //  Echo 10/17: EF 40-45, diffuse HK, mild LAE, PFO closure device without residual shunt  //  Could not tol cMRI    . Depression 02/02/2015  . Morbid obesity (HCC) 06/04/2014  . Dysphagia, pharyngoesophageal phase 06/04/2014  . PFO with atrial septal aneurysm 06/04/2014    a - s/p transcatheter closure in 2/16 with 25 mm Amplatzer Cribiform ASO device // b - Echo 10/17:  EF 40-45, diffuse HK, mild LAE, PFO closure device without residual shunt    . Hyperlipidemia LDL goal <70 06/04/2014  . Ischemic brain damage   . Acute respiratory failure  with hypoxia (HCC)   . 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 Aphasia complicating stroke 05/29/2014  . Hemiplegia affecting dominant side (HCC) 05/29/2014  . CONSTIPATION 03/09/2008    Qualifier: Diagnosis of  By: Koleen Distance CMA (AAMA), Hulan Saas     . IRRITABLE BOWEL SYNDROME 03/09/2008    Qualifier: Diagnosis of  By: Koleen Distance CMA Duncan Dull), Hulan Saas      Social History   Occupational History  . Occupation: Radiology Scheduler  Tobacco Use  . Smoking status: Never Smoker  . Smokeless tobacco: Never Used  Substance and Sexual Activity  . Alcohol use: No    Alcohol/week: 0.0 standard drinks  . Drug use: No  . Sexual activity: Not Currently    Birth control/protection: Abstinence, Pill    Comment: Rebecca Russo   Social History   Social History Narrative   Patient is single with 2 children --> 25 and 20, both in college. Daughter is at [employer] studying nursing. Son at [employer].   Patient is right handed.   Patient has her Associates degree.   Patient drinks 1 soda a month.   Worked in radiology scheduling   Past Medical History:  Diagnosis Date  . Aphasia complicating stroke   . Bilateral swelling of feet   . Chronic systolic heart failure (HCC)  06/09/2016   a - Prob nonischemic //  Echo 10/17: EF 40-45, diffuse HK, mild LAE, PFO closure device without residual shunt  //  Could not tol cMRI // Echo 4/18: EF 55, normal wall motion, normal diastolic function, trivial MR, PFO device in place without residual shunt, PASP 24  . Constipation   . Eczema   . H/O varicella   . Hemiplegia affecting dominant side (HCC)   . History of ischemic left MCA stroke   . Increased BMI 12/17/08  . PFO with atrial septal aneurysm 06/04/2014   a - s/p transcatheter closure in 2/16 with 25 mm Amplatzer Cribiform ASO device // b - Echo 10/17:  EF 40-45, diffuse HK,  mild LAE, PFO closure device without residual shunt   . Stroke (cerebrum) [facility](HCC)    Past Surgical History:  Procedure Laterality Date  . CESAREAN SECTION  1999  . COLON SURGERY  2004   partial colectomy  . LAPAROSCOPIC CHOLECYSTECTOMY  06/20/2012  . PATENT FORAMEN OVALE CLOSURE  07/09/2014  . PATENT FORAMEN OVALE CLOSURE N/A 07/09/2014   Procedure: PATENT FORAMEN OVALE CLOSURE;  Surgeon: Micheline ChapmanMichael D Cooper, MD;  [facility];  Service: Cardiovascular;  Laterality: N/A;  . RADIOLOGY WITH ANESTHESIA N/A 05/29/2014   Procedure: RADIOLOGY WITH ANESTHESIA;  Surgeon: Oneal GroutSanjeev K Deveshwar, MD;  Location: [facility] OR;  Service: Radiology;  Laterality: N/A;  . TEE WITHOUT CARDIOVERSION N/A 06/03/2014   Procedure: TRANSESOPHAGEAL ECHOCARDIOGRAM (TEE);  Surgeon: Wendall StadePeter C Nishan, MD;  [facility];  Service: Cardiovascular;  Laterality: N/A;   family history includes Cancer in an other family member; Diabetes in her father and mother; Heart disease in her father and mother; Hypertension in her father and mother; Sleep apnea in her mother; Stroke in her father, maternal grandfather, and paternal grandfather. There is no history of Breast cancer or Colon cancer. Recent Labs    08/29/17 1500 11/08/17 0922 04/03/18 0933 06/13/18 0835  HGBA1C 5.6 5.4  --   --   CREATININE 0.78 0.81 0.91 0.76  CALCIUM 9.4 9.5 9.7 9.3  AST 22 18  --  22  ALT 18 16  --  22  TSH 4.310 3.880  --   --     OBJECTIVE:  VS:  HT:5\' 10"  (177.8 cm)   WT:(!) 366 lb 6.4 oz (166.2 kg)  BMI:52.57    BP:108/78  HR:91bpm  TEMP: ( )  RESP:94 %   PHYSICAL EXAM: Adult female. No acute distress.  Alert and appropriate. EYES: Pupils are equal., EOM intact without nystagmus. and No scleral icterus. Psychiatric: Alert & appropriately interactive. and Not depressed or anxious appearing. EXTREMITY EXAM: Warm and well perfused  Left knee: Overall well aligned with generalized osteoarthritic bossing.  Small effusion.  Very large  soft tissue envelope.  Ligamentously stable.  Pain with McMurray's and pain crepitation with patellar grind.  Extensor mechanism intact.     ASSESSMENT:   1. Pain in lateral portion of left knee   2. Acute pain of left knee     PROCEDURES:  US Guided Injection per procedure note      PLAN:  Pertinent additional documentation may be included in corresponding procedure notes, imaging studies, problem based documentation and patient instructions.  No problem-specific Assessment & Plan notes found for this encounter.   Tricompartmental degenerative changes.  She will benefit from injection, continued weight loss and compression if she can tolerate.  Body Helix Compression Sleeve compression sleeve was discussed with her and information was  provided.  She does have an upcoming gastric surgery we discussed that steroids greater than 4 weeks out should not have any effect on her healing potential.  Activity modifications and the importance of avoiding exacerbating activities (limiting pain to no more than a 4 / 10 during or following activity) recommended and discussed.  Discussed red flag symptoms that warrant earlier emergent evaluation and patient voices understanding.   No orders of the defined types were placed in this encounter.  Lab Orders  No laboratory test(s) ordered today   Imaging Orders     US MSK POCT ULTRASOUND Referral Orders  No referral(s) requested today    Return if symptoms worsen or fail to improve.  I am happy to see her back at any time but given the upcoming surgery I will defer follow-up as long she remains asymptomatic.         Andrena MewsMichael D Rigby, DO    [place] [facility] Physician

## 2018-06-22 NOTE — Procedures (Signed)
PROCEDURE NOTE:  Ultrasound Guided: Injection: Left knee Images were obtained and interpreted by myself, Gaspar Bidding, DO  Images have been saved and stored to PACS system. Images obtained on: GE S7 Ultrasound machine    ULTRASOUND FINDINGS:  Tricompartmental degenerative changes.  Moderate osteophytic spurring with small to moderate effusion.   DESCRIPTION OF PROCEDURE:  The patient's clinical condition is marked by substantial pain and/or significant functional disability. Other conservative therapy has not provided relief, is contraindicated, or not appropriate. There is a reasonable likelihood that injection will significantly improve the patient's pain and/or functional impairment.   After discussing the risks, benefits and expected outcomes of the injection and all questions were reviewed and answered, the patient wished to undergo the above named procedure.  Verbal consent was obtained.  The ultrasound was used to identify the target structure and adjacent neurovascular structures. The skin was then prepped in sterile fashion and the target structure was injected under direct visualization using sterile technique as below:  Single injection performed as below: PREP: Alcohol and Ethel Chloride APPROACH:superiolateral, single injection, 21g 2 in. INJECTATE: 2 cc 0.5% Marcaine and 2 cc 40mg /mL DepoMedrol ASPIRATE: None DRESSING: Band-Aid  Post procedural instructions including recommending icing and warning signs for infection were reviewed.    This procedure was well tolerated and there were no complications.   IMPRESSION: Succesful Ultrasound Guided: Injection

## 2018-06-26 ENCOUNTER — Ambulatory Visit
Admission: RE | Admit: 2018-06-26 | Discharge: 2018-06-26 | Disposition: A | Discharge status: Home / Self Care | Payer: 59 | Source: Ambulatory Visit | Attending: Physician Assistant | Admitting: Physician Assistant

## 2018-06-26 DIAGNOSIS — Z1231 Encounter for screening mammogram for malignant neoplasm of breast: Secondary | ICD-10-CM

## 2018-07-01 ENCOUNTER — Telehealth: Payer: Self-pay | Admitting: Skilled Nursing Facility1

## 2018-07-01 ENCOUNTER — Telehealth: Payer: Self-pay

## 2018-07-01 NOTE — Telephone Encounter (Signed)
-----   Message from Tonny Bollman, MD sent at 06/29/2018 12:43 PM EST ----- Regarding: RE: Pre Op Cardiac Clearance request Rebecca Russo: I reviewed her chart and just saw her in November 2019. Echo at that time looked fine with normal LVEF. She should be at low cardiac risk of surgery and it is ok for her to hold plavix 5 days prior to the planned surgery. Let me know if any other questions or preoperative issues.   Thx - Dr Excell Seltzer ----- Message ----- From: Rebecca Russo Sent: 06/25/2018   1:40 PM EST To: Tonny Bollman, MD Subject: Pre Op Cardiac Clearance request               Good afternoon Dr. Excell Seltzer,  [name] was recently seen and evaluated for weight loss surgery.  The patient has chosen a tentative surgery date of July 29, 2018.  [name] will need Pre Operative Cardiac Clearance for her upcoming Laparoscopic Sleeve Gastrectomy, under General Anesthesia.    Please advise if the patient is cleared from a cardiac standpoint and if any medications need to be held prior to surgery.  Please feel free to give me a call at [phone] if you have any questions or concerns.   Kind regards,  Rebecca Russo, [facility] Bariatric Program Surgery Coordinator  [phone] Office [phone] Fax

## 2018-07-01 NOTE — Telephone Encounter (Signed)
Clearance faxed

## 2018-07-01 NOTE — Telephone Encounter (Signed)
Dietitian called pt to assess their understanding of the pre-op nutrition recommendations through the teach back method to ensure the pts knowledge readiness in preparation for surgery.    LVM 

## 2018-07-02 ENCOUNTER — Telehealth: Payer: Self-pay | Admitting: Skilled Nursing Facility1

## 2018-07-02 NOTE — Telephone Encounter (Signed)
Opened in error

## 2018-07-03 ENCOUNTER — Ambulatory Visit (INDEPENDENT_AMBULATORY_CARE_PROVIDER_SITE_OTHER): Payer: 59 | Admitting: Obstetrics and Gynecology

## 2018-07-03 ENCOUNTER — Encounter: Payer: Self-pay | Admitting: Obstetrics and Gynecology

## 2018-07-03 VITALS — BP 130/72 | HR 84 | Resp 16 | Ht 70.0 in | Wt 367.0 lb

## 2018-07-03 DIAGNOSIS — Z30431 Encounter for routine checking of intrauterine contraceptive device: Secondary | ICD-10-CM

## 2018-07-03 NOTE — Progress Notes (Signed)
GYNECOLOGY  VISIT   HPI: 43 y.o.   Single  African American  female   [phone] with Patient's last menstrual period was 05/30/2018.   here for 4 week IUD check     Some spotting.  No significant discomfort.   Not sexually active since IUD placed.   GYNECOLOGIC HISTORY: Patient's last menstrual period was 05/30/2018. Contraception:  Mirena IUD Menopausal hormone therapy:  n/a Last mammogram:  06/26/18 BIRADS 1 negative/density b Last pap smear:   09-19-17 Neg:Neg HR HPV        OB History    Gravida  2   Para  2   Term  2   Preterm      AB      Living  2     SAB      TAB      Ectopic      Multiple      Live Births  2              Patient Active Problem List   Diagnosis Date Noted  . Left knee pain 06/20/2018  . Chronic systolic heart failure (HCC) 06/09/2016  . Depression 02/02/2015  . Morbid obesity (HCC) 06/04/2014  . Dysphagia, pharyngoesophageal phase 06/04/2014  . PFO with atrial septal aneurysm 06/04/2014  . Hyperlipidemia LDL goal <70 06/04/2014  . Ischemic brain damage   . Acute respiratory failure with hypoxia (HCC)   . Cerebrovascular accident (CVA) due to embolism of left middle cerebral artery (HCC) 05/29/2014  . Aphasia complicating stroke 05/29/2014  . Hemiplegia affecting dominant side (HCC) 05/29/2014  . CONSTIPATION 03/09/2008  . IRRITABLE BOWEL SYNDROME 03/09/2008    Past Medical History:  Diagnosis Date  . Aphasia complicating stroke   . Bilateral swelling of feet   . Chronic systolic heart failure (HCC) 06/09/2016   a - Prob nonischemic //  Echo 10/17: EF 40-45, diffuse HK, mild LAE, PFO closure device without residual shunt  //  Could not tol cMRI // Echo 4/18: EF 55, normal wall motion, normal diastolic function, trivial MR, PFO device in place without residual shunt, PASP 24  . Constipation   . Eczema   . H/O varicella   . Hemiplegia affecting dominant side (HCC)   . History of ischemic left MCA stroke   . Increased BMI  12/17/08  . PFO with atrial septal aneurysm 06/04/2014   a - s/p transcatheter closure in 2/16 with 25 mm Amplatzer Cribiform ASO device // b - Echo 10/17:  EF 40-45, diffuse HK, mild LAE, PFO closure device without residual shunt   . Stroke (cerebrum) [facility])     Past Surgical History:  Procedure Laterality Date  . CESAREAN SECTION  1999  . COLON SURGERY  2004   partial colectomy  . LAPAROSCOPIC CHOLECYSTECTOMY  06/20/2012  . PATENT FORAMEN OVALE CLOSURE  07/09/2014  . PATENT FORAMEN OVALE CLOSURE N/A 07/09/2014   Procedure: PATENT FORAMEN OVALE CLOSURE;  Surgeon: Micheline Chapman, MD;  Location[facility] C[facility] LAB;  Service: Cardiovascular;  Laterality: N/A;  . RADIOLOGY WITH ANESTHESIA N/A 05/29/2014   Procedure: RADIOLOGY WITH ANESTHESIA;  Surgeon: Oneal Grout, MD;  Location: [facility] OR;  Service: Radiology;  Laterality: N/A;  . TEE WITHOUT CARDIOVERSION N/A 06/03/2014   Procedure: TRANSESOPHAGEAL ECHOCARDIOGRAM (TEE);  Surgeon: Wendall Stade, MD;  Location: [facility] ENDOSCOPY;  Service: Cardiovascular;  Laterality: N/A;    Current Outpatient Medications  Medication Sig Dispense Refill  . citalopram (CELEXA) 20 MG tablet Take 1 tablet (  20 mg total) by mouth daily. 90 tablet 1  . clopidogrel (PLAVIX) 75 MG tablet Take 1 tablet (75 mg total) by mouth daily. 30 tablet 0  . furosemide (LASIX) 20 MG tablet Take 1 tablet (20 mg total) by mouth daily. 30 tablet 11  . levonorgestrel (MIRENA) 20 MCG/24HR IUD 1 each by Intrauterine route once. IUD place by GYN 05/2018, needs to be removed in 5 years 05/2023.    Marland Kitchen potassium chloride (K-DUR) 10 MEQ tablet Take 1 tablet (10 mEq total) by mouth daily. 30 tablet 11  . rosuvastatin (CRESTOR) 10 MG tablet Take 1 tablet (10 mg total) by mouth daily. 90 tablet 1   No current facility-administered medications for this visit.      ALLERGIES: No known allergies  Family History  Problem Relation Age of Onset  . Hypertension Mother   . Diabetes Mother   . Heart disease  Mother   . Sleep apnea Mother   . Hypertension Father   . Stroke Father   . Diabetes Father   . Heart disease Father   . Stroke Paternal Grandfather   . Stroke Maternal Grandfather   . Cancer Other   . Breast cancer Neg Hx   . Colon cancer Neg Hx     Social History   Socioeconomic History  . Marital status: Single    Spouse name: Not on file  . Number of children: 2  . Years of education: Associates  . Highest education level: Not on file  Occupational History  . Occupation: Radiology Scheduler  Social Needs  . Financial resource strain: Not on file  . Food insecurity:    Worry: Not on file    Inability: Not on file  . Transportation needs:    Medical: Not on file    Non-medical: Not on file  Tobacco Use  . Smoking status: Never Smoker  . Smokeless tobacco: Never Used  Substance and Sexual Activity  . Alcohol use: No    Alcohol/week: 0.0 standard drinks  . Drug use: No  . Sexual activity: Not Currently    Birth control/protection: Abstinence, Pill    Comment: [name]  Lifestyle  . Physical activity:    Days per week: Not on file    Minutes per session: Not on file  . Stress: Not on file  Relationships  . Social connections:    Talks on phone: Not on file    Gets together: Not on file    Attends religious service: Not on file    Active member of club or organization: Not on file    Attends meetings of clubs or organizations: Not on file    Relationship status: Not on file  . Intimate partner violence:    Fear of current or ex partner: Not on file    Emotionally abused: Not on file    Physically abused: Not on file    Forced sexual activity: Not on file  Other Topics Concern  . Not on file  Social History Narrative   Patient is single with 2 children --> 25 and 20, both in college. Daughter is at [employer] studying nursing. Son at [employer].   Patient is right handed.   Patient has her Associates degree.   Patient drinks 1 soda a month.   Worked in radiology  scheduling    Review of Systems  Constitutional: Negative.   HENT: Negative.   Eyes: Negative.   Respiratory: Negative.   Cardiovascular: Negative.   Gastrointestinal: Negative.  Endocrine: Negative.   Genitourinary: Negative.   Musculoskeletal: Negative.   Skin: Negative.   Allergic/Immunologic: Negative.   Neurological: Negative.   Hematological: Negative.   Psychiatric/Behavioral: Negative.     PHYSICAL EXAMINATION:    BP 130/72 (BP Location: Right Arm, Patient Position: Sitting, Cuff Size: Large)   Pulse 84   Resp 16   Ht 5\' 10"  (1.778 m)   Wt (!) 367 lb (166.5 kg)   LMP 05/30/2018   BMI 52.66 kg/m     General appearance: alert, cooperative and appears stated age   Pelvic: External genitalia:  no lesions              Urethra:  normal appearing urethra with no masses, tenderness or lesions              Bartholins and Skenes: normal                 Vagina: normal appearing vagina with normal color and discharge, no lesions              Cervix: no lesions.  IUD strings noted.                 Bimanual Exam:  Uterus:  normal size, contour, position, consistency, mobility, non-tender              Adnexa: no mass, fullness, tenderness        Chaperone was present for exam.  ASSESSMENT  Mirena IUD check up.  Doing well.  PLAN  Discussion of bleeding profiles with Mirena IUD.  Reassurance regarding pregnancy prevention.  FU for annual exam in Jan 2021 and prn.    An After Visit Summary was printed and given to the patient.  __15____ minutes face to face time of which over 50% was spent in counseling.

## 2018-07-08 ENCOUNTER — Other Ambulatory Visit: Payer: Self-pay

## 2018-07-08 ENCOUNTER — Ambulatory Visit (HOSPITAL_COMMUNITY)
Admission: EM | Admit: 2018-07-08 | Discharge: 2018-07-08 | Disposition: A | Discharge status: Home / Self Care | Payer: 59 | Attending: Family Medicine | Admitting: Family Medicine

## 2018-07-08 ENCOUNTER — Ambulatory Visit: Payer: Self-pay | Admitting: General Surgery

## 2018-07-08 ENCOUNTER — Encounter (HOSPITAL_COMMUNITY): Payer: Self-pay

## 2018-07-08 DIAGNOSIS — G43719 Chronic migraine without aura, intractable, without status migrainosus: Secondary | ICD-10-CM

## 2018-07-08 MED ORDER — TRAMADOL HCL 50 MG PO TABS: 50.0000 mg | ORAL_TABLET | Freq: Four times a day (QID) | ORAL | 0 refills | Status: DC | PRN

## 2018-07-08 MED ORDER — DEXAMETHASONE SODIUM PHOSPHATE 10 MG/ML IJ SOLN
INTRAMUSCULAR | Status: AC
  Filled 2018-07-08: qty 1

## 2018-07-08 MED ORDER — KETOROLAC TROMETHAMINE 60 MG/2ML IM SOLN
INTRAMUSCULAR | Status: AC
  Filled 2018-07-08: qty 2

## 2018-07-08 MED ORDER — DEXAMETHASONE SODIUM PHOSPHATE 10 MG/ML IJ SOLN
10.0000 mg | Freq: Once | INTRAMUSCULAR | Status: AC
  Administered 2018-07-08: 10 mg via INTRAMUSCULAR

## 2018-07-08 MED ORDER — KETOROLAC TROMETHAMINE 60 MG/2ML IM SOLN
60.0000 mg | Freq: Once | INTRAMUSCULAR | Status: AC
  Administered 2018-07-08: 60 mg via INTRAMUSCULAR

## 2018-07-08 NOTE — Discharge Instructions (Addendum)
Please be careful driving home I have prescribed tramadol to take for pain if needed Take 1 or 2 tablets for headache.  Please take with food Be careful driving or operating machinery on this medicine as it can cause drowsiness Follow-up with your primary care doctor

## 2018-07-08 NOTE — ED Triage Notes (Signed)
Pt cc migraine this started yesterday. Pt will be seeing a neurologist in April.

## 2018-07-08 NOTE — ED Provider Notes (Signed)
[facility]    CSN: [id number] Arrival date & time: 07/08/18  1728     History   Chief Complaint Chief Complaint  Patient presents with  . Migraine    HPI Rebecca Russo is a 43 y.o. female.   HPI Patient states she does have a history of migraines although she has not had one for years.  Today she is having a severe headache with photophobia.  Mild nausea.  No head injury.  No recent infection or sinus pressure.  No numbness or weakness in arms or legs.  No vomiting.  No problems with balance, cognition.  She has had a stroke in the past.  She is on Plavix. Past Medical History:  Diagnosis Date  . Aphasia complicating stroke   . Bilateral swelling of feet   . Chronic systolic heart failure (HCC) 06/09/2016   a - Prob nonischemic //  Echo 10/17: EF 40-45, diffuse HK, mild LAE, PFO closure device without residual shunt  //  Could not tol cMRI // Echo 4/18: EF 55, normal wall motion, normal diastolic function, trivial MR, PFO device in place without residual shunt, PASP 24  . Constipation   . Eczema   . H/O varicella   . Hemiplegia affecting dominant side (HCC)   . History of ischemic left MCA stroke   . Increased BMI 12/17/08  . PFO with atrial septal aneurysm 06/04/2014   a - s/p transcatheter closure in 2/16 with 25 mm Amplatzer Cribiform ASO device // b - Echo 10/17:  EF 40-45, diffuse HK, mild LAE, PFO closure device without residual shunt   . Stroke (cerebrum) [facility])     Patient Active Problem List   Diagnosis Date Noted  . Left knee pain 06/20/2018  . Chronic systolic heart failure (HCC) 06/09/2016  . Depression 02/02/2015  . Morbid obesity (HCC) 06/04/2014  . Dysphagia, pharyngoesophageal phase 06/04/2014  . PFO with atrial septal aneurysm 06/04/2014  . Hyperlipidemia LDL goal <70 06/04/2014  . Ischemic brain damage   . Acute respiratory failure with hypoxia (HCC)   . Cerebrovascular accident (CVA) due to embolism of left middle cerebral artery (HCC)  05/29/2014  . Aphasia complicating stroke 05/29/2014  . Hemiplegia affecting dominant side (HCC) 05/29/2014  . CONSTIPATION 03/09/2008  . IRRITABLE BOWEL SYNDROME 03/09/2008    Past Surgical History:  Procedure Laterality Date  . CESAREAN SECTION  1999  . COLON SURGERY  2004   partial colectomy  . LAPAROSCOPIC CHOLECYSTECTOMY  06/20/2012  . PATENT FORAMEN OVALE CLOSURE  07/09/2014  . PATENT FORAMEN OVALE CLOSURE N/A 07/09/2014   Procedure: PATENT FORAMEN OVALE CLOSURE;  Surgeon: Micheline Chapman, MD;  Location: [facility] CATH LAB;  Service: Cardiovascular;  Laterality: N/A;  . RADIOLOGY WITH ANESTHESIA N/A 05/29/2014   Procedure: RADIOLOGY WITH ANESTHESIA;  Surgeon: Oneal Grout, MD;  Location: [facility] OR;  Service: Radiology;  Laterality: N/A;  . TEE WITHOUT CARDIOVERSION N/A 06/03/2014   Procedure: TRANSESOPHAGEAL ECHOCARDIOGRAM (TEE);  Surgeon: Wendall Stade, MD;  Location: [facility] ENDOSCOPY;  Service: Cardiovascular;  Laterality: N/A;    OB History    Gravida  2   Para  2   Term  2   Preterm      AB      Living  2     SAB      TAB      Ectopic      Multiple      Live Births  2  Home Medications    Prior to Admission medications   Medication Sig Start Date End Date Taking? Authorizing Provider  citalopram (CELEXA) 20 MG tablet Take 1 tablet (20 mg total) by mouth daily. 05/17/18   [name], PA  clopidogrel (PLAVIX) 75 MG tablet Take 1 tablet (75 mg total) by mouth daily. 11/16/17   Tonny Bollman, MD  furosemide (LASIX) 20 MG tablet Take 1 tablet (20 mg total) by mouth daily. 04/03/18 03/29/19  Tonny Bollman, MD  levonorgestrel (MIRENA) 20 MCG/24HR IUD 1 each by Intrauterine route once. IUD place by GYN 05/2018, needs to be removed in 5 years 05/2023.    [provider]  potassium chloride (K-DUR) 10 MEQ tablet Take 1 tablet (10 mEq total) by mouth daily. 04/03/18 03/29/19  Tonny Bollman, MD  rosuvastatin (CRESTOR) 10 MG tablet Take 1 tablet  (10 mg total) by mouth daily. 06/14/18   [name], PA  traMADol (ULTRAM) 50 MG tablet Take 1-2 tablets (50-100 mg total) by mouth every 6 (six) hours as needed (headache). 07/08/18   Eustace Moore, MD    Family History Family History  Problem Relation Age of Onset  . Hypertension Mother   . Diabetes Mother   . Heart disease Mother   . Sleep apnea Mother   . Hypertension Father   . Stroke Father   . Diabetes Father   . Heart disease Father   . Stroke Paternal Grandfather   . Stroke Maternal Grandfather   . Cancer Other   . Breast cancer Neg Hx   . Colon cancer Neg Hx     Social History Social History   Tobacco Use  . Smoking status: Never Smoker  . Smokeless tobacco: Never Used  Substance Use Topics  . Alcohol use: No    Alcohol/week: 0.0 standard drinks  . Drug use: No     Allergies   No known allergies   Review of Systems Review of Systems  Constitutional: Negative for chills and fever.  HENT: Negative for ear pain and sore throat.   Eyes: Positive for photophobia. Negative for pain and visual disturbance.  Respiratory: Negative for cough and shortness of breath.   Cardiovascular: Negative for chest pain and palpitations.  Gastrointestinal: Positive for nausea. Negative for abdominal pain and vomiting.  Genitourinary: Negative for dysuria and hematuria.  Musculoskeletal: Negative for arthralgias and back pain.  Skin: Negative for color change and rash.  Neurological: Positive for headaches. Negative for seizures and syncope.  All other systems reviewed and are negative.    Physical Exam Triage Vital Signs ED Triage Vitals  Enc Vitals Group     BP 07/08/18 1821 138/77     Pulse Rate 07/08/18 1821 83     Resp 07/08/18 1821 18     Temp 07/08/18 1821 98.7 F (37.1 C)     Temp Source 07/08/18 1821 Oral     SpO2 07/08/18 1821 100 %     Weight 07/08/18 1819 (!) 325 lb (147.4 kg)     Height --      Head Circumference --      Peak Flow --       Pain Score 07/08/18 1819 10     Pain Loc --      Pain Edu? --      Excl. in GC? --    No data found.  Updated Vital Signs BP 138/77 (BP Location: Right Arm)   Pulse 83   Temp 98.7 F (37.1 C) (Oral)  Resp 18   Wt (!) 147.4 kg   SpO2 100%   BMI 46.63 kg/m   Visual Acuity Right Eye Distance:   Left Eye Distance:   Bilateral Distance:    Right Eye Near:   Left Eye Near:    Bilateral Near:     Physical Exam Constitutional:      General: She is in acute distress.     Appearance: She is well-developed. She is obese. She is ill-appearing.  HENT:     Head: Normocephalic and atraumatic.     Right Ear: Tympanic membrane and ear canal normal.     Left Ear: Tympanic membrane and ear canal normal.     Nose: Nose normal. No congestion.     Mouth/Throat:     Mouth: Mucous membranes are moist.  Eyes:     Conjunctiva/sclera: Conjunctivae normal.     Pupils: Pupils are equal, round, and reactive to light.     Comments: Difficult eye exam due to severe photophobia.  Neck:     Musculoskeletal: Normal range of motion.  Cardiovascular:     Rate and Rhythm: Normal rate and regular rhythm.     Heart sounds: Normal heart sounds.  Pulmonary:     Effort: Pulmonary effort is normal. No respiratory distress.     Breath sounds: Normal breath sounds.  Abdominal:     General: Abdomen is flat. There is no distension.     Palpations: Abdomen is soft.  Musculoskeletal: Normal range of motion.  Skin:    General: Skin is warm and dry.  Neurological:     General: No focal deficit present.     Mental Status: She is alert.     Cranial Nerves: No cranial nerve deficit.     Sensory: No sensory deficit.     Motor: No weakness.     Coordination: Coordination normal.     Gait: Gait normal.     Deep Tendon Reflexes: Reflexes normal.  Psychiatric:        Mood and Affect: Mood normal.        Behavior: Behavior normal.      UC Treatments / Results  Labs (all labs ordered are listed, but  only abnormal results are displayed) Labs Reviewed - No data to display  EKG None  Radiology No results found.  Procedures Procedures (including critical care time)  Medications Ordered in UC Medications  ketorolac (TORADOL) injection 60 mg (60 mg Intramuscular Given 07/08/18 1852)  dexamethasone (DECADRON) injection 10 mg (10 mg Intramuscular Given 07/08/18 1854)    Initial Impression / Assessment and Plan / UC Course  I have reviewed the triage vital signs and the nursing notes.  Pertinent labs & imaging results that were available during my care of the patient were reviewed by me and considered in my medical decision making (see chart for details).     Patient is given IM Toradol and Decadron.  She was observed for 40 minutes.  She had a definite improvement in her headache and wished to go home.  She was felt to be safe to drive and was discharged. Final Clinical Impressions(s) / UC Diagnoses   Final diagnoses:  Intractable chronic migraine without aura and without status migrainosus     Discharge Instructions     Please be careful driving home I have prescribed tramadol to take for pain if needed Take 1 or 2 tablets for headache.  Please take with food Be careful driving or operating machinery on this medicine as  it can cause drowsiness Follow-up with your primary care doctor   ED Prescriptions    Medication Sig Dispense Auth. Provider   traMADol (ULTRAM) 50 MG tablet Take 1-2 tablets (50-100 mg total) by mouth every 6 (six) hours as needed (headache). 15 tablet Eustace MooreNelson, Chanta Bauers Sue, MD     Controlled Substance Prescriptions [place] Controlled Substance Registry consulted? yes   Eustace MooreNelson, Aunisty Reali Sue, MD 07/08/18 2029

## 2018-07-09 MED FILL — traMADol HCL 50 MG TABS: 50 | 2 days supply | Qty: 15 | Fill #0

## 2018-07-15 NOTE — Progress Notes (Signed)
NEUROLOGY CONSULTATION NOTE  Rebecca Russo MRN: [id number] DOB: 12-05-1975  Referring provider: [name], PA Primary care provider: [name], PA  Reason for consult:  History of stroke and migraines  HISTORY OF PRESENT ILLNESS: Rebecca Russo is a 43 year old right-handed African American woman with chronic systolic heart failure who presents for history of stroke and migraines.  History supplemented by hospital, prior neurology and referring provider notes.  She has history of migraines, initially in mid to late 30s.  They would initially occur once in awhile but have increased in past 3 months.  Migraines start in right frontal region and spreads to the left side.  They usually are a pounding headache that is gradual, ranging from 5 to 10/10 in intensity.  No preceding aura or premonitory stage.  There is associated nausea, photophobia, phonophobia, osmophobia.  When severe, blurred vision.  No associated vomiting or unilateral numbness or weakness.  They last 1 to 3 days.  She has had 15 to 20 days in past 30 days.  There are no specific triggers.  Nothing relieves them. She was seen in the ED on 07/08/18 where she received IM Toradol and Decadron.    She tried tramadol (side effects).  She cannot take Advil or Aleve do to already being on antiplatelet therapy.  She was previously on a preventative but cannot remember the name.  Per her records, propranolol was listed as a past medication.    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  Patient refused MRI but repeat CT scan was personally reviewed and revealed left MCA stroke and CT perfusion showed left brain penumbra.  CTA of head and neck showed no emergent large vessel occlusion or stenosis.  Hypercoagulable workup negative.  TEE demonstrated large PFO with mobile atrial septal aneurysm.  She was discharged on ASA and Crestor for secondary stroke prevention. She underwent PFO  closure on 07/09/14 and was on dual antiplatelet therapy for the next 6 months.  Following the stroke, she continues having trouble with getting words out, short term memory deficits (forgetting names) and has residual right hand numbness.    Most recent 2D echo from 04/05/18 showed EF 50-55% with atrial septal aneurysm and no obvious residual PFO.    LDL from 06/13/18 was 101.  Crestor was increased from 5mg  to 10mg  daily.  Current medications include:  Plavix, Crestor 10mg , furosemide 20mg , citalopram 20mg , tramadol 50mg    PAST MEDICAL HISTORY: Past Medical History:  Diagnosis Date  . Aphasia complicating stroke   . Bilateral swelling of feet   . Chronic systolic heart failure (HCC) 06/09/2016   a - Prob nonischemic //  Echo 10/17: EF 40-45, diffuse HK, mild LAE, PFO closure device without residual shunt  //  Could not tol cMRI // Echo 4/18: EF 55, normal wall motion, normal diastolic function, trivial MR, PFO device in place without residual shunt, PASP 24  . Constipation   . Eczema   . H/O varicella   . Hemiplegia affecting dominant side (HCC)   . History of ischemic left MCA stroke   . Increased BMI 12/17/08  . PFO with atrial septal aneurysm 06/04/2014   a - s/p transcatheter closure in 2/16 with 25 mm Amplatzer Cribiform ASO device // b - Echo 10/17:  EF 40-45, diffuse HK, mild LAE, PFO closure device without residual shunt   . Stroke (cerebrum) (HCC)     PAST SURGICAL HISTORY: Past Surgical  History:  Procedure Laterality Date  . CESAREAN SECTION  1999  . COLON SURGERY  2004   partial colectomy  . LAPAROSCOPIC CHOLECYSTECTOMY  06/20/2012  . PATENT FORAMEN OVALE CLOSURE  07/09/2014  . PATENT FORAMEN OVALE CLOSURE N/A 07/09/2014   Procedure: PATENT FORAMEN OVALE CLOSURE;  Surgeon: Micheline Chapman, MD;  Location: [facility] CATH LAB;  Service: Cardiovascular;  Laterality: N/A;  . RADIOLOGY WITH ANESTHESIA N/A 05/29/2014   Procedure: RADIOLOGY WITH ANESTHESIA;  Surgeon: Oneal Grout,  MD;  Location: [facility] OR;  Service: Radiology;  Laterality: N/A;  . TEE WITHOUT CARDIOVERSION N/A 06/03/2014   Procedure: TRANSESOPHAGEAL ECHOCARDIOGRAM (TEE);  Surgeon: Wendall Stade, MD;  Location: [facility] ENDOSCOPY;  Service: Cardiovascular;  Laterality: N/A;    MEDICATIONS: Current Outpatient Medications on File Prior to Visit  Medication Sig Dispense Refill  . citalopram (CELEXA) 20 MG tablet Take 1 tablet (20 mg total) by mouth daily. 90 tablet 1  . clopidogrel (PLAVIX) 75 MG tablet Take 1 tablet (75 mg total) by mouth daily. 30 tablet 0  . furosemide (LASIX) 20 MG tablet Take 1 tablet (20 mg total) by mouth daily. 30 tablet 11  . levonorgestrel (MIRENA) 20 MCG/24HR IUD 1 each by Intrauterine route once. IUD place by GYN 05/2018, needs to be removed in 5 years 05/2023.    Marland Kitchen potassium chloride (K-DUR) 10 MEQ tablet Take 1 tablet (10 mEq total) by mouth daily. 30 tablet 11  . rosuvastatin (CRESTOR) 10 MG tablet Take 1 tablet (10 mg total) by mouth daily. 90 tablet 1  . traMADol (ULTRAM) 50 MG tablet Take 1-2 tablets (50-100 mg total) by mouth every 6 (six) hours as needed (headache). 15 tablet 0   No current facility-administered medications on file prior to visit.     ALLERGIES: Allergies  Allergen Reactions  . No Known Allergies     FAMILY HISTORY: Family History  Problem Relation Age of Onset  . Hypertension Mother   . Diabetes Mother   . Heart disease Mother   . Sleep apnea Mother   . Hypertension Father   . Stroke Father   . Diabetes Father   . Heart disease Father   . Stroke Paternal Grandfather   . Stroke Maternal Grandfather   . Cancer Other   . Breast cancer Neg Hx   . Colon cancer Neg Hx    SOCIAL HISTORY: Social History   Socioeconomic History  . Marital status: Single    Spouse name: Not on file  . Number of children: 2  . Years of education: Associates  . Highest education level: Not on file  Occupational History  . Occupation: Radiology Scheduler  Social  Needs  . Financial resource strain: Not on file  . Food insecurity:    Worry: Not on file    Inability: Not on file  . Transportation needs:    Medical: Not on file    Non-medical: Not on file  Tobacco Use  . Smoking status: Never Smoker  . Smokeless tobacco: Never Used  Substance and Sexual Activity  . Alcohol use: No    Alcohol/week: 0.0 standard drinks  . Drug use: No  . Sexual activity: Not Currently    Birth control/protection: Abstinence, Pill    Comment: [name]  Lifestyle  . Physical activity:    Days per week: Not on file    Minutes per session: Not on file  . Stress: Not on file  Relationships  . Social connections:    Talks on  phone: Not on file    Gets together: Not on file    Attends religious service: Not on file    Active member of club or organization: Not on file    Attends meetings of clubs or organizations: Not on file    Relationship status: Not on file  . Intimate partner violence:    Fear of current or ex partner: Not on file    Emotionally abused: Not on file    Physically abused: Not on file    Forced sexual activity: Not on file  Other Topics Concern  . Not on file  Social History Narrative   Patient is single with 2 children --> 25 and 20, both in college. Daughter is at [employer] studying nursing. Son at [employer].   Patient is right handed.   Patient has her Associates degree.   Patient drinks 1 soda a month.   Worked in radiology scheduling    REVIEW OF SYSTEMS: Constitutional: No fevers, chills, or sweats, no generalized fatigue, change in appetite Eyes: No visual changes, double vision, eye pain Ear, nose and throat: No hearing loss, ear pain, nasal congestion, sore throat Cardiovascular: No chest pain, palpitations Respiratory:  No shortness of breath at rest or with exertion, wheezes GastrointestinaI: No nausea, vomiting, diarrhea, abdominal pain, fecal incontinence Genitourinary:  No dysuria, urinary retention or  frequency Musculoskeletal:  No neck pain, back pain Integumentary: No rash, pruritus, skin lesions Neurological: as above Psychiatric: No depression, insomnia, anxiety Endocrine: No palpitations, fatigue, diaphoresis, mood swings, change in appetite, change in weight, increased thirst Hematologic/Lymphatic:  No purpura, petechiae. Allergic/Immunologic: no itchy/runny eyes, nasal congestion, recent allergic reactions, rashes  PHYSICAL EXAM: Blood pressure 112/74, pulse 84, height 5' 10.5" (1.791 m), weight (!) 366 lb (166 kg), SpO2 98 %. General: No acute distress.  Patient appears well-groomed.   Head:  Normocephalic/atraumatic Eyes:  fundi examined but not visualized Neck: supple, no paraspinal tenderness, full range of motion Back: No paraspinal tenderness Heart: regular rate and rhythm Lungs: Clear to auscultation bilaterally. Vascular: No carotid bruits. Neurological Exam: Mental status: alert and oriented to person, place, and time, recent and remote memory intact, fund of knowledge intact, attention and concentration intact, speech with some hesitancy, including repetition and naming.  Follows commands. CN I: not tested CN II: pupils equal, round and reactive to light, visual fields intact CN III, IV, VI:  full range of motion, no nystagmus, no ptosis CN V: facial sensation intact CN VII: upper and lower face symmetric CN VIII: hearing intact CN IX, X: gag intact, uvula midline CN XI: sternocleidomastoid and trapezius muscles intact CN XII: tongue midline Bulk & Tone: normal, no fasciculations. Motor:  5/5 throughout  Sensation:  Reduced pinprick and vibratory sensation in right upper extremity.  Otherwise, normal. Deep Tendon Reflexes:  2+ throughout, toes downgoing.   Finger to nose testing:  Without dysmetria.   Heel to shin:  Without dysmetria.   Gait:  Normal station and stride.  Able to turn, some difficulty with tandem walk. Romberg negative.  IMPRESSION: 1.   Expressive aphasia as late effect of left MCA territory infarct, cryptogenic but possibly embolic in setting of PFO. 2.  Chronic migraine without status migrainosus, not intractable 3.  Morbid obesity (BMI 51.77)  PLAN: 1.  For migraine prophylaxis, start Aimovig 70mg  2.  For migraine abortive therapy, Reyvow 100mg .  If ineffective, will try 200mg  3.  Limit use of pain relievers to no more than 2 days out of week to  prevent risk of rebound or medication-overuse headache. 4.  Keep headache diary 5.  Exercise, diet/weight loss, caffeine cessation 6.  For secondary stroke prevention, continue Plavix  daily as per PCP. 7.  Continue Crestor as per PCP (LDL goal less than 70) 8.  Follow up in 4 months  Thank you for allowing me to take part in the care of this patient.  Shon Millet, DO  CC: [name], PA

## 2018-07-17 ENCOUNTER — Encounter

## 2018-07-17 ENCOUNTER — Encounter: Payer: Self-pay | Admitting: Neurology

## 2018-07-17 ENCOUNTER — Ambulatory Visit (INDEPENDENT_AMBULATORY_CARE_PROVIDER_SITE_OTHER): Payer: 59 | Admitting: Neurology

## 2018-07-17 VITALS — BP 112/74 | HR 84 | Ht 70.5 in | Wt 366.0 lb

## 2018-07-17 DIAGNOSIS — I63412 Cerebral infarction due to embolism of left middle cerebral artery: Secondary | ICD-10-CM

## 2018-07-17 DIAGNOSIS — G43719 Chronic migraine without aura, intractable, without status migrainosus: Secondary | ICD-10-CM | POA: Diagnosis not present

## 2018-07-17 DIAGNOSIS — I253 Aneurysm of heart: Secondary | ICD-10-CM

## 2018-07-17 DIAGNOSIS — I69320 Aphasia following cerebral infarction: Secondary | ICD-10-CM

## 2018-07-17 DIAGNOSIS — Q2112 Patent foramen ovale: Secondary | ICD-10-CM

## 2018-07-17 DIAGNOSIS — Q211 Atrial septal defect: Secondary | ICD-10-CM | POA: Diagnosis not present

## 2018-07-17 MED ORDER — ERENUMAB-AOOE 70 MG/ML ~~LOC~~ SOAJ: 70.0000 mg | SUBCUTANEOUS | 11 refills | Status: DC

## 2018-07-17 NOTE — Patient Instructions (Addendum)
1.  For preventative management, we will start Aimovig 70mg  monthly 2.  When you get a migraine, take Reyvow 100mg , 1 pill in 24 hours. 3.  Limit use of pain relievers to no more than 2 days out of week to prevent risk of rebound or medication-overuse headache. Samples (lot# [id number]) and co-pay card provided. 4.  Keep headache diary 5.  Exercise, diet/weight loss, hydration, caffeine cessation, sleep hygiene, monitor for and avoid triggers 6.  Consider:  magnesium citrate 400mg  daily, riboflavin 400mg  daily, and coenzyme Q10 100mg  three times daily 7.  Follow up in 4 months

## 2018-07-18 ENCOUNTER — Ambulatory Visit: Payer: Self-pay | Admitting: General Surgery

## 2018-07-18 DIAGNOSIS — Z6841 Body Mass Index (BMI) 40.0 and over, adult: Secondary | ICD-10-CM | POA: Diagnosis not present

## 2018-07-18 MED FILL — ONDANSETRON ODT 4 MG TABLET: 4 | 4 days supply | Qty: 15 | Fill #0

## 2018-07-18 MED FILL — PANTOPRAZOLE SOD DR 40 MG T: 40 | 30 days supply | Qty: 30 | Fill #0

## 2018-07-18 MED FILL — oxyCODONE HCL 5 MG/5ML SOLN: 5 | 5 days supply | Qty: 100 | Fill #0

## 2018-07-22 ENCOUNTER — Ambulatory Visit: Payer: Self-pay | Admitting: General Surgery

## 2018-07-22 ENCOUNTER — Encounter (HOSPITAL_COMMUNITY): Payer: Self-pay

## 2018-07-22 NOTE — Patient Instructions (Signed)
Your procedure is scheduled on: Monday, July 29, 2018   Surgery Time:  11:45AM-1:43PM   Report to [facility] Main  Entrance    Report to admitting at 9:45 AM   Call this number if you have problems the morning of surgery [phone]   Do not eat food:After Midnight.   May have liquids until 8:45AM day of surgery   CLEAR LIQUID DIET  Foods Allowed                                                                     Foods Excluded  Water, Black Coffee and tea, regular and decaf                             liquids that you cannot  Plain Jell-O in any flavor                                             see through such as: Fruit ices (not with fruit pulp)                                     milk, soups, orange juice  Iced Popsicles                                    All solid food Carbonated beverages, regular and diet                                    Cranberry, grape and apple juices Sports drinks like Gatorade Lightly seasoned clear broth or consume(fat free) Sugar, honey syrup  Sample Menu Breakfast                                Lunch                                     Supper Cranberry juice                    Beef broth                            Chicken broth Jell-O                                     Grape juice                           Apple juice Coffee or tea  Jell-O                                      Popsicle                                                Coffee or tea                        Coffee or tea   Brush your teeth the morning of surgery.   Do NOT smoke after Midnight   Complete one Ensure drink the morning of surgery 8:45AM the day of surgery.   Take these medicines the morning of surgery with A SIP OF WATER: Citalopram, Rosuvastatin                               You may not have any metal on your body including hair pins, jewelry, and body piercings             Do not wear make-up, lotions, powders,  perfumes/cologne, or deodorant             Do not wear nail polish.  Do not shave  48 hours prior to surgery.             Do not bring valuables to the hospital. [facility] IS NOT             RESPONSIBLE   FOR VALUABLES.   Contacts, dentures or bridgework may not be worn into surgery.   Leave suitcase in the car. After surgery it may be brought to your room.    Special Instructions: Bring a copy of your healthcare power of attorney and living will documents         the day of surgery if you haven't scanned them in before.              Please read over the following fact sheets you were given:  [facility] - Preparing for Surgery Before surgery, you can play an important role.  Because skin is not sterile, your skin needs to be as free of germs as possible.  You can reduce the number of germs on your skin by washing with CHG (chlorahexidine gluconate) soap before surgery.  CHG is an antiseptic cleaner which kills germs and bonds with the skin to continue killing germs even after washing. Please DO NOT use if you have an allergy to CHG or antibacterial soaps.  If your skin becomes reddened/irritated stop using the CHG and inform your nurse when you arrive at Short Stay. Do not shave (including legs and underarms) for at least 48 hours prior to the first CHG shower.  You may shave your face/neck.  Please follow these instructions carefully:  1.  Shower with CHG Soap the night before surgery and the  morning of surgery.  2.  If you choose to wash your hair, wash your hair first as usual with your normal  shampoo.  3.  After you shampoo, rinse your hair and body thoroughly to remove the shampoo.  4.  Use CHG as you would any other liquid soap.  You can apply chg directly to the skin and wash.  Gently with a scrungie or clean washcloth.  5.  Apply the CHG Soap to your body ONLY FROM THE NECK DOWN.   Do   not use on face/ open                           Wound or open  sores. Avoid contact with eyes, ears mouth and   genitals (private parts).                       Wash face,  Genitals (private parts) with your normal soap.             6.  Wash thoroughly, paying special attention to the area where your    surgery  will be performed.  7.  Thoroughly rinse your body with warm water from the neck down.  8.  DO NOT shower/wash with your normal soap after using and rinsing off the CHG Soap.                9.  Pat yourself dry with a clean towel.            10.  Wear clean pajamas.            11.  Place clean sheets on your bed the night of your first shower and do not  sleep with pets. Day of Surgery : Do not apply any lotions/deodorants the morning of surgery.  Please wear clean clothes to the hospital/surgery center.  FAILURE TO FOLLOW THESE INSTRUCTIONS MAY RESULT IN THE CANCELLATION OF YOUR SURGERY  PATIENT SIGNATURE_________________________________  NURSE SIGNATURE__________________________________  ________________________________________________________________________   Rebecca Russo  An incentive spirometer is a tool that can help keep your lungs clear and active. This tool measures how well you are filling your lungs with each breath. Taking long deep breaths may help reverse or decrease the chance of developing breathing (pulmonary) problems (especially infection) following:  A long period of time when you are unable to move or be active. BEFORE THE PROCEDURE   If the spirometer includes an indicator to show your best effort, your nurse or respiratory therapist will set it to a desired goal.  If possible, sit up straight or lean slightly forward. Try not to slouch.  Hold the incentive spirometer in an upright position. INSTRUCTIONS FOR USE  1. Sit on the edge of your bed if possible, or sit up as far as you can in bed or on a chair. 2. Hold the incentive spirometer in an upright position. 3. Breathe out normally. 4. Place the  mouthpiece in your mouth and seal your lips tightly around it. 5. Breathe in slowly and as deeply as possible, raising the piston or the ball toward the top of the column. 6. Hold your breath for 3-5 seconds or for as long as possible. Allow the piston or ball to fall to the bottom of the column. 7. Remove the mouthpiece from your mouth and breathe out normally. 8. Rest for a few seconds and repeat Steps 1 through 7 at least 10 times every 1-2 hours when you are awake. Take your time and take a few normal breaths between deep breaths. 9. The spirometer may include an indicator to show your best effort. Use the indicator as a goal to work toward during each  repetition. 10. After each set of 10 deep breaths, practice coughing to be sure your lungs are clear. If you have an incision (the cut made at the time of surgery), support your incision when coughing by placing a pillow or rolled up towels firmly against it. Once you are able to get out of bed, walk around indoors and cough well. You may stop using the incentive spirometer when instructed by your caregiver.  RISKS AND COMPLICATIONS  Take your time so you do not get dizzy or light-headed.  If you are in pain, you may need to take or ask for pain medication before doing incentive spirometry. It is harder to take a deep breath if you are having pain. AFTER USE  Rest and breathe slowly and easily.  It can be helpful to keep track of a log of your progress. Your caregiver can provide you with a simple table to help with this. If you are using the spirometer at home, follow these instructions: [place] IF:   You are having difficultly using the spirometer.  You have trouble using the spirometer as often as instructed.  Your pain medication is not giving enough relief while using the spirometer.  You develop fever of 100.5 F (38.1 C) or higher. SEEK IMMEDIATE MEDICAL CARE IF:   You cough up bloody sputum that had not been present  before.  You develop fever of 102 F (38.9 C) or greater.  You develop worsening pain at or near the incision site. MAKE SURE YOU:   Understand these instructions.  Will watch your condition.  Will get help right away if you are not doing well or get worse. Document Released: 09/11/2006 Document Revised: 07/24/2011 Document Reviewed: 11/12/2006 ExitCare Patient Information 2014 ExitCare, [place].   ________________________________________________________________________  WHAT IS A BLOOD TRANSFUSION? Blood Transfusion Information  A transfusion is the replacement of blood or some of its parts. Blood is made up of multiple cells which provide different functions.  Red blood cells carry oxygen and are used for blood loss replacement.  White blood cells fight against infection.  Platelets control bleeding.  Plasma helps clot blood.  Other blood products are available for specialized needs, such as hemophilia or other clotting disorders. BEFORE THE TRANSFUSION  Who gives blood for transfusions?   Healthy volunteers who are fully evaluated to make sure their blood is safe. This is blood bank blood. Transfusion therapy is the safest it has ever been in the practice of medicine. Before blood is taken from a donor, a complete history is taken to make sure that person has no history of diseases nor engages in risky social behavior (examples are intravenous drug use or sexual activity with multiple partners). The donor's travel history is screened to minimize risk of transmitting infections, such as malaria. The donated blood is tested for signs of infectious diseases, such as HIV and hepatitis. The blood is then tested to be sure it is compatible with you in order to minimize the chance of a transfusion reaction. If you or a relative donates blood, this is often done in anticipation of surgery and is not appropriate for emergency situations. It takes many days to process the donated  blood. RISKS AND COMPLICATIONS Although transfusion therapy is very safe and saves many lives, the main dangers of transfusion include:   Getting an infectious disease.  Developing a transfusion reaction. This is an allergic reaction to something in the blood you were given. Every precaution is taken to prevent this.  The decision to have a blood transfusion has been considered carefully by your caregiver before blood is given. Blood is not given unless the benefits outweigh the risks. AFTER THE TRANSFUSION  Right after receiving a blood transfusion, you will usually feel much better and more energetic. This is especially true if your red blood cells have gotten low (anemic). The transfusion raises the level of the red blood cells which carry oxygen, and this usually causes an energy increase.  The nurse administering the transfusion will monitor you carefully for complications. HOME CARE INSTRUCTIONS  No special instructions are needed after a transfusion. You may find your energy is better. Speak with your caregiver about any limitations on activity for underlying diseases you may have. SEEK MEDICAL CARE IF:   Your condition is not improving after your transfusion.  You develop redness or irritation at the intravenous (IV) site. SEEK IMMEDIATE MEDICAL CARE IF:  Any of the following symptoms occur over the next 12 hours:  Shaking chills.  You have a temperature by mouth above 102 F (38.9 C), not controlled by medicine.  Chest, back, or muscle pain.  People around you feel you are not acting correctly or are confused.  Shortness of breath or difficulty breathing.  Dizziness and fainting.  You get a rash or develop hives.  You have a decrease in urine output.  Your urine turns a dark color or changes to pink, red, or brown. Any of the following symptoms occur over the next 10 days:  You have a temperature by mouth above 102 F (38.9 C), not controlled by  medicine.  Shortness of breath.  Weakness after normal activity.  The white part of the eye turns yellow (jaundice).  You have a decrease in the amount of urine or are urinating less often.  Your urine turns a dark color or changes to pink, red, or brown. Document Released: 04/28/2000 Document Revised: 07/24/2011 Document Reviewed: 12/16/2007 [facility] Patient Information 2014 [place], [place].  _______________________________________________________________________

## 2018-07-22 NOTE — Pre-Procedure Instructions (Signed)
The following are in epic: Last office visit Dr. Madelaine Bhat 07/17/2018 EKG 01/08/2018 ECHO 04/05/2018 CXR 01/08/2018

## 2018-07-23 ENCOUNTER — Encounter (HOSPITAL_COMMUNITY): Payer: Self-pay

## 2018-07-23 ENCOUNTER — Encounter (HOSPITAL_COMMUNITY)
Admission: RE | Admit: 2018-07-23 | Discharge: 2018-07-23 | Disposition: A | Discharge status: Home / Self Care | Payer: 59 | Source: Ambulatory Visit | Attending: General Surgery | Admitting: General Surgery

## 2018-07-23 ENCOUNTER — Other Ambulatory Visit: Payer: Self-pay

## 2018-07-23 DIAGNOSIS — E785 Hyperlipidemia, unspecified: Secondary | ICD-10-CM | POA: Diagnosis not present

## 2018-07-23 DIAGNOSIS — Z8673 Personal history of transient ischemic attack (TIA), and cerebral infarction without residual deficits: Secondary | ICD-10-CM | POA: Diagnosis not present

## 2018-07-23 DIAGNOSIS — I5022 Chronic systolic (congestive) heart failure: Secondary | ICD-10-CM | POA: Diagnosis not present

## 2018-07-23 DIAGNOSIS — E639 Nutritional deficiency, unspecified: Secondary | ICD-10-CM | POA: Diagnosis not present

## 2018-07-23 DIAGNOSIS — Z01812 Encounter for preprocedural laboratory examination: Secondary | ICD-10-CM | POA: Insufficient documentation

## 2018-07-23 DIAGNOSIS — Z823 Family history of stroke: Secondary | ICD-10-CM | POA: Diagnosis not present

## 2018-07-23 DIAGNOSIS — Z6841 Body Mass Index (BMI) 40.0 and over, adult: Secondary | ICD-10-CM | POA: Diagnosis not present

## 2018-07-23 DIAGNOSIS — R4701 Aphasia: Secondary | ICD-10-CM | POA: Diagnosis not present

## 2018-07-23 DIAGNOSIS — Z811 Family history of alcohol abuse and dependence: Secondary | ICD-10-CM | POA: Diagnosis not present

## 2018-07-23 HISTORY — DX: Cardiomegaly: I51.7

## 2018-07-23 HISTORY — DX: Migraine, unspecified, not intractable, without status migrainosus: G43.909

## 2018-07-23 HISTORY — DX: Vitamin D deficiency, unspecified: E55.9

## 2018-07-23 HISTORY — DX: Morbid (severe) obesity due to excess calories: E66.01

## 2018-07-23 LAB — COMPREHENSIVE METABOLIC PANEL
ALK PHOS: 73 U/L (ref 38–126)
ALT: 15 U/L (ref 0–44)
AST: 17 U/L (ref 15–41)
Albumin: 3.6 g/dL (ref 3.5–5.0)
Anion gap: 5 (ref 5–15)
BUN: 9 mg/dL (ref 6–20)
CALCIUM: 9 mg/dL (ref 8.9–10.3)
CO2: 23 mmol/L (ref 22–32)
Chloride: 110 mmol/L (ref 98–111)
Creatinine, Ser: 0.87 mg/dL (ref 0.44–1.00)
GFR calc non Af Amer: >60 mL/min (ref >60)
Glucose, Bld: 111 mg/dL — ABNORMAL HIGH (ref 70–99)
Potassium: 4 mmol/L (ref 3.5–5.1)
Sodium: 138 mmol/L (ref 135–145)
Total Bilirubin: 0.6 mg/dL (ref 0.3–1.2)
Total Protein: 7.5 g/dL (ref 6.5–8.1)

## 2018-07-23 LAB — CBC WITH DIFFERENTIAL/PLATELET
Abs Immature Granulocytes: 0.04 10*3/uL (ref 0.00–0.07)
Basophils Absolute: 0.1 10*3/uL (ref 0.0–0.1)
Basophils Relative: 1 %
Eosinophils Absolute: 0.1 10*3/uL (ref 0.0–0.5)
Eosinophils Relative: 1 %
HCT: 37 % (ref 36.0–46.0)
Hemoglobin: 11.8 g/dL — ABNORMAL LOW (ref 12.0–15.0)
Immature Granulocytes: 0 %
Lymphocytes Relative: 33 %
Lymphs Abs: 3.7 10*3/uL (ref 0.7–4.0)
MCH: 29.9 pg (ref 26.0–34.0)
MCHC: 31.9 g/dL (ref 30.0–36.0)
MCV: 93.7 fL (ref 80.0–100.0)
Monocytes Absolute: 0.6 10*3/uL (ref 0.1–1.0)
Monocytes Relative: 5 %
Neutro Abs: 6.7 10*3/uL (ref 1.7–7.7)
Neutrophils Relative %: 60 %
Platelets: 400 10*3/uL (ref 150–400)
RBC: 3.95 MIL/uL (ref 3.87–5.11)
RDW: 13.5 % (ref 11.5–15.5)
WBC: 11.2 10*3/uL — ABNORMAL HIGH (ref 4.0–10.5)
nRBC: 0 % (ref 0.0–0.2)

## 2018-07-23 LAB — ABO/RH: ABO/RH(D): B POS

## 2018-07-23 NOTE — Progress Notes (Addendum)
07/01/2018- Cardiac Clearance- Dr Excell Seltzer in epic.  Last dose of Plavix on 07/22/2018 per patient  For Anesthesia Review.

## 2018-07-24 NOTE — Progress Notes (Signed)
Anesthesia Chart Review   Case:  [id number] Date/Time:  07/29/18 1130   Procedure:  LAPAROSCOPIC GASTRIC SLEEVE RESECTION, UPPER ENDO, ERAS Pathway (N/A )   Anesthesia type:  General   Pre-op diagnosis:  Morbid Obesity,  H/O Stroke, CVA, Chronic Systolic Heart Failure, Aphasia, Hyperlipidemia   Location:  [facility] ROOM 02 / [facility]   Surgeon:  Glenna Fellows, MD      DISCUSSION: 43 yo never smoker with h/o chronic systolic heart failure, PFO with atrial septal aneurysm (s/p PFO closure 2016), stoke with right sided weakness, migraines, morbid obesity scheduled for above procedure 07/29/18 with Dr. Glenna Fellows.   Cardiac clearance received 07/01/18.  Per Dr. Tonny Bollman, "I reviewed her chart and just saw her in November 2019. Echo at that time looked fine with normal LVEF. She should be at low cardiac risk of surgery and it is ok for her to hold plavix 5 days prior to the planned surgery. Let me know if any other questions or preoperative issues."  Pt reports last dose of Plavix 07/22/18.    Pt can proceed with planned procedure barring acute status change.  VS: BP (!) 148/86 (BP Location: Left Arm)   Pulse 82   Temp 37.1 C (Oral)   Resp 18   Ht 5' 10.5" (1.791 m)   Wt (!) 164.8 kg   SpO2 100%   BMI 51.38 kg/m   PROVIDERS: [name], PA is PCP   Shon Millet DO is neurologist   Tonny Bollman, MD is Cardiologist  LABS: Labs reviewed: Acceptable for surgery. (all labs ordered are listed, but only abnormal results are displayed)  Labs Reviewed  CBC WITH DIFFERENTIAL/PLATELET - Abnormal; Notable for the following components:      Result Value   WBC 11.2 (*)    Hemoglobin 11.8 (*)    All other components within normal limits  COMPREHENSIVE METABOLIC PANEL - Abnormal; Notable for the following components:   Glucose, Bld 111 (*)    All other components within normal limits  TYPE AND SCREEN     IMAGES:   EKG: 01/08/18 Rate 79 bpm Normal sinus rhythm  Cannot  rule out anterior infarct, age undetermined Abnormal ECG No significant change since last tracing   CV: Echo 04/05/18 Study Conclusions  - Left ventricle: The cavity size was normal. Wall thickness was   normal. Systolic function was normal. The estimated ejection   fraction was in the range of 50% to 55%. Wall motion was normal;   there were no regional wall motion abnormalities. Left   ventricular diastolic function parameters were normal. - Left atrium: The atrium was moderately dilated. - Atrial septum: There was an atrial septal aneurysm.  Impressions:  - Low normal LV systolic LV function; moderate LAE; atrial septal   aneurysm; PFO closure device not well visualized; no obvious   residual PFO; GLS -17.9%.  Past Medical History:  Diagnosis Date  . Aphasia complicating stroke   . Bilateral swelling of feet   . Chronic systolic heart failure (HCC) 06/09/2016   a - Prob nonischemic //  Echo 10/17: EF 40-45, diffuse HK, mild LAE, PFO closure device without residual shunt  //  Could not tol cMRI // Echo 4/18: EF 55, normal wall motion, normal diastolic function, trivial MR, PFO device in place without residual shunt, PASP 24  . Constipation   . Eczema   . H/O varicella   . Hemiplegia affecting dominant side (HCC)   . History of ischemic left MCA  stroke    weakness on right side  . Increased BMI 12/17/08  . LAE (left atrial enlargement) 04/05/2018   Mod, noted on ECHO  . Migraines   . Morbid obesity (HCC)   . PFO with atrial septal aneurysm 06/04/2014   a - s/p transcatheter closure in 2/16 with 25 mm Amplatzer Cribiform ASO device // b - Echo 10/17:  EF 40-45, diffuse HK, mild LAE, PFO closure device without residual shunt   . Stroke (cerebrum) (HCC)   . Vitamin D deficiency     Past Surgical History:  Procedure Laterality Date  . CESAREAN SECTION  1999  . COLON SURGERY  2004   partial colectomy  . LAPAROSCOPIC CHOLECYSTECTOMY  06/20/2012  . PATENT FORAMEN OVALE  CLOSURE  07/09/2014  . PATENT FORAMEN OVALE CLOSURE N/A 07/09/2014   Procedure: PATENT FORAMEN OVALE CLOSURE;  Surgeon: Micheline Chapman, MD;  [facility] C[facility] LAB;  Service: Cardiovascular;  Laterality: N/A;  . RADIOLOGY WITH ANESTHESIA N/A 05/29/2014   Procedure: RADIOLOGY WITH ANESTHESIA;  Surgeon: Oneal Grout, MD;  Location: [facility] OR;  Service: Radiology;  Laterality: N/A;  . TEE WITHOUT CARDIOVERSION N/A 06/03/2014   Procedure: TRANSESOPHAGEAL ECHOCARDIOGRAM (TEE);  Surgeon: Wendall Stade, MD;  Location: [facility] ENDOSCOPY;  Service: Cardiovascular;  Laterality: N/A;    MEDICATIONS: . citalopram (CELEXA) 20 MG tablet  . clopidogrel (PLAVIX) 75 MG tablet  . Erenumab-aooe (AIMOVIG) 70 MG/ML SOAJ  . furosemide (LASIX) 20 MG tablet  . levonorgestrel (MIRENA) 20 MCG/24HR IUD  . potassium chloride (K-DUR) 10 MEQ tablet  . rosuvastatin (CRESTOR) 10 MG tablet  . traMADol (ULTRAM) 50 MG tablet   No current facility-administered medications for this encounter.    Janey Genta [facility] Pre-Surgical Testing [phone] 07/24/18 3:06 PM

## 2018-07-24 NOTE — Anesthesia Preprocedure Evaluation (Addendum)
Anesthesia Evaluation  Patient identified by MRN, date of birth, ID band Patient awake    Reviewed: Allergy & Precautions, NPO status , Patient's Chart, lab work & pertinent test results  Airway Mallampati: II  TM Distance: >3 FB Neck ROM: Full    Dental no notable dental hx.    Pulmonary neg pulmonary ROS,    Pulmonary exam normal breath sounds clear to auscultation       Cardiovascular negative cardio ROS Normal cardiovascular exam Rhythm:Regular Rate:Normal     Neuro/Psych  Headaches, Depression CVA, Residual Symptoms negative psych ROS   GI/Hepatic negative GI ROS, Neg liver ROS,   Endo/Other  Morbid obesity  Renal/GU negative Renal ROS  negative genitourinary   Musculoskeletal negative musculoskeletal ROS (+)   Abdominal (+) + obese,   Peds negative pediatric ROS (+)  Hematology negative hematology ROS (+)   Anesthesia Other Findings   Reproductive/Obstetrics negative OB ROS                             Anesthesia Physical Anesthesia Plan  ASA: III  Anesthesia Plan: General   Post-op Pain Management:    Induction: Intravenous  PONV Risk Score and Plan: 3 and Ondansetron, Dexamethasone and Midazolam  Airway Management Planned: Oral ETT  Additional Equipment:   Intra-op Plan:   Post-operative Plan: Extubation in OR  Informed Consent: I have reviewed the patients History and Physical, chart, labs and discussed the procedure including the risks, benefits and alternatives for the proposed anesthesia with the patient or authorized representative who has indicated his/her understanding and acceptance.     Dental advisory given  Plan Discussed with: CRNA  Anesthesia Plan Comments: (See PAT note 07/23/18, Jodell Cipro, PA-C)       Anesthesia Quick Evaluation

## 2018-07-28 MED ORDER — BUPIVACAINE LIPOSOME 1.3 % IJ SUSP
20.0000 mL | Freq: Once | INTRAMUSCULAR | Status: DC
  Filled 2018-07-28: qty 20

## 2018-07-29 ENCOUNTER — Inpatient Hospital Stay (HOSPITAL_COMMUNITY): Payer: 59 | Admitting: Anesthesiology

## 2018-07-29 ENCOUNTER — Inpatient Hospital Stay (HOSPITAL_COMMUNITY): Payer: 59 | Admitting: Physician Assistant

## 2018-07-29 ENCOUNTER — Encounter (HOSPITAL_COMMUNITY): Payer: Self-pay | Admitting: General Practice

## 2018-07-29 ENCOUNTER — Encounter (HOSPITAL_COMMUNITY)
Admission: RE | Disposition: A | Discharge status: Home / Self Care | Payer: Self-pay | Source: Home / Self Care | Attending: General Surgery

## 2018-07-29 ENCOUNTER — Other Ambulatory Visit: Payer: Self-pay

## 2018-07-29 ENCOUNTER — Inpatient Hospital Stay (HOSPITAL_COMMUNITY)
Admission: RE | Admit: 2018-07-29 | Discharge: 2018-07-30 | DRG: 620 | Disposition: A | Discharge status: Home / Self Care | Payer: 59 | Attending: General Surgery | Admitting: General Surgery

## 2018-07-29 DIAGNOSIS — R4701 Aphasia: Secondary | ICD-10-CM | POA: Diagnosis present

## 2018-07-29 DIAGNOSIS — Z833 Family history of diabetes mellitus: Secondary | ICD-10-CM

## 2018-07-29 DIAGNOSIS — Q2112 Patent foramen ovale: Secondary | ICD-10-CM

## 2018-07-29 DIAGNOSIS — Z6841 Body Mass Index (BMI) 40.0 and over, adult: Secondary | ICD-10-CM

## 2018-07-29 DIAGNOSIS — E785 Hyperlipidemia, unspecified: Secondary | ICD-10-CM | POA: Diagnosis present

## 2018-07-29 DIAGNOSIS — Z823 Family history of stroke: Secondary | ICD-10-CM | POA: Diagnosis not present

## 2018-07-29 DIAGNOSIS — Z8673 Personal history of transient ischemic attack (TIA), and cerebral infarction without residual deficits: Secondary | ICD-10-CM

## 2018-07-29 DIAGNOSIS — Z9049 Acquired absence of other specified parts of digestive tract: Secondary | ICD-10-CM

## 2018-07-29 DIAGNOSIS — I5022 Chronic systolic (congestive) heart failure: Secondary | ICD-10-CM | POA: Diagnosis present

## 2018-07-29 DIAGNOSIS — Q211 Atrial septal defect: Secondary | ICD-10-CM

## 2018-07-29 DIAGNOSIS — I428 Other cardiomyopathies: Secondary | ICD-10-CM

## 2018-07-29 DIAGNOSIS — Z811 Family history of alcohol abuse and dependence: Secondary | ICD-10-CM | POA: Diagnosis not present

## 2018-07-29 DIAGNOSIS — E639 Nutritional deficiency, unspecified: Secondary | ICD-10-CM | POA: Diagnosis present

## 2018-07-29 HISTORY — PX: LAPAROSCOPIC GASTRIC SLEEVE RESECTION: SHX5895

## 2018-07-29 LAB — PREGNANCY, URINE: Preg Test, Ur: NEGATIVE

## 2018-07-29 LAB — HEMOGLOBIN AND HEMATOCRIT, BLOOD
HCT: 36.3 % (ref 36.0–46.0)
Hemoglobin: 11.3 g/dL — ABNORMAL LOW (ref 12.0–15.0)

## 2018-07-29 LAB — TYPE AND SCREEN
ABO/RH(D): B POS
Antibody Screen: NEGATIVE

## 2018-07-29 SURGERY — GASTRECTOMY, SLEEVE, LAPAROSCOPIC
Anesthesia: General | Site: Abdomen

## 2018-07-29 MED ORDER — ROCURONIUM BROMIDE 10 MG/ML (PF) SYRINGE
PREFILLED_SYRINGE | INTRAVENOUS | Status: DC | PRN
  Administered 2018-07-29: 60 mg via INTRAVENOUS

## 2018-07-29 MED ORDER — PANTOPRAZOLE SODIUM 40 MG IV SOLR
40.0000 mg | Freq: Every day | INTRAVENOUS | Status: DC
  Administered 2018-07-29: 40 mg via INTRAVENOUS
  Filled 2018-07-29: qty 40

## 2018-07-29 MED ORDER — SODIUM CHLORIDE (PF) 0.9 % IJ SOLN
INTRAMUSCULAR | Status: DC | PRN
  Administered 2018-07-29: 50 mL

## 2018-07-29 MED ORDER — ONDANSETRON HCL 4 MG/2ML IJ SOLN: 4.0000 mg | INTRAMUSCULAR | Status: DC | PRN

## 2018-07-29 MED ORDER — LIDOCAINE 2% (20 MG/ML) 5 ML SYRINGE
INTRAMUSCULAR | Status: DC | PRN
  Administered 2018-07-29: 100 mg via INTRAVENOUS

## 2018-07-29 MED ORDER — CELECOXIB 200 MG PO CAPS
400.0000 mg | ORAL_CAPSULE | ORAL | Status: AC
  Administered 2018-07-29: 400 mg via ORAL
  Filled 2018-07-29: qty 2

## 2018-07-29 MED ORDER — HYDROMORPHONE HCL 1 MG/ML IJ SOLN
0.2500 mg | INTRAMUSCULAR | Status: DC | PRN
  Administered 2018-07-29 (×4): 0.5 mg via INTRAVENOUS

## 2018-07-29 MED ORDER — LACTATED RINGERS IR SOLN
Status: DC | PRN
  Administered 2018-07-29: 1000 mL

## 2018-07-29 MED ORDER — KETAMINE HCL 100 MG/ML IJ SOLN
INTRAMUSCULAR | Status: DC | PRN
  Administered 2018-07-29: 40 mg via INTRAVENOUS

## 2018-07-29 MED ORDER — BUPIVACAINE-EPINEPHRINE 0.25% -1:200000 IJ SOLN
INTRAMUSCULAR | Status: DC | PRN
  Administered 2018-07-29: 30 mL

## 2018-07-29 MED ORDER — PROPOFOL 10 MG/ML IV BOLUS
INTRAVENOUS | Status: DC | PRN
  Administered 2018-07-29: 250 mg via INTRAVENOUS

## 2018-07-29 MED ORDER — LIDOCAINE 20MG/ML (2%) 15 ML SYRINGE OPTIME
INTRAMUSCULAR | Status: DC | PRN
  Administered 2018-07-29: 1.5 mg/kg/h via INTRAVENOUS

## 2018-07-29 MED ORDER — EPHEDRINE SULFATE-NACL 50-0.9 MG/10ML-% IV SOSY
PREFILLED_SYRINGE | INTRAVENOUS | Status: DC | PRN
  Administered 2018-07-29: 10 mg via INTRAVENOUS

## 2018-07-29 MED ORDER — SCOPOLAMINE 1 MG/3DAYS TD PT72
1.0000 | MEDICATED_PATCH | TRANSDERMAL | Status: DC
  Administered 2018-07-29: 1.5 mg via TRANSDERMAL
  Filled 2018-07-29: qty 1

## 2018-07-29 MED ORDER — OXYCODONE HCL 5 MG PO TABS: 5.0000 mg | ORAL_TABLET | Freq: Once | ORAL | Status: DC | PRN

## 2018-07-29 MED ORDER — HEPARIN SODIUM (PORCINE) 5000 UNIT/ML IJ SOLN
5000.0000 [IU] | INTRAMUSCULAR | Status: AC
  Administered 2018-07-29: 5000 [IU] via SUBCUTANEOUS
  Filled 2018-07-29: qty 1

## 2018-07-29 MED ORDER — GABAPENTIN 300 MG PO CAPS
300.0000 mg | ORAL_CAPSULE | ORAL | Status: AC
  Administered 2018-07-29: 300 mg via ORAL
  Filled 2018-07-29: qty 1

## 2018-07-29 MED ORDER — PROPOFOL 10 MG/ML IV BOLUS
INTRAVENOUS | Status: AC
  Filled 2018-07-29: qty 20

## 2018-07-29 MED ORDER — DEXAMETHASONE SODIUM PHOSPHATE 10 MG/ML IJ SOLN
INTRAMUSCULAR | Status: AC
  Filled 2018-07-29: qty 1

## 2018-07-29 MED ORDER — 0.9 % SODIUM CHLORIDE (POUR BTL) OPTIME
TOPICAL | Status: DC | PRN
  Administered 2018-07-29: 1000 mL

## 2018-07-29 MED ORDER — POTASSIUM CHLORIDE IN NACL 20-0.9 MEQ/L-% IV SOLN
INTRAVENOUS | Status: DC
  Administered 2018-07-29 – 2018-07-30 (×2): via INTRAVENOUS
  Filled 2018-07-29 (×3): qty 1000

## 2018-07-29 MED ORDER — MIDAZOLAM HCL 5 MG/5ML IJ SOLN
INTRAMUSCULAR | Status: DC | PRN
  Administered 2018-07-29: 2 mg via INTRAVENOUS

## 2018-07-29 MED ORDER — CHLORHEXIDINE GLUCONATE CLOTH 2 % EX PADS: 6.0000 | MEDICATED_PAD | Freq: Once | CUTANEOUS | Status: DC

## 2018-07-29 MED ORDER — EVICEL 5 ML EX KIT
PACK | Freq: Once | CUTANEOUS | Status: AC
  Administered 2018-07-29: 1
  Filled 2018-07-29: qty 1

## 2018-07-29 MED ORDER — SUGAMMADEX SODIUM 500 MG/5ML IV SOLN
INTRAVENOUS | Status: AC
  Filled 2018-07-29: qty 5

## 2018-07-29 MED ORDER — FENTANYL CITRATE (PF) 100 MCG/2ML IJ SOLN
INTRAMUSCULAR | Status: AC
  Filled 2018-07-29: qty 2

## 2018-07-29 MED ORDER — BUPIVACAINE LIPOSOME 1.3 % IJ SUSP
INTRAMUSCULAR | Status: DC | PRN
  Administered 2018-07-29: 20 mL

## 2018-07-29 MED ORDER — ROCURONIUM BROMIDE 10 MG/ML (PF) SYRINGE
PREFILLED_SYRINGE | INTRAVENOUS | Status: AC
  Filled 2018-07-29: qty 10

## 2018-07-29 MED ORDER — PROMETHAZINE HCL 25 MG/ML IJ SOLN
INTRAMUSCULAR | Status: AC
  Filled 2018-07-29: qty 1

## 2018-07-29 MED ORDER — HYDROMORPHONE HCL 1 MG/ML IJ SOLN
INTRAMUSCULAR | Status: AC
  Filled 2018-07-29: qty 2

## 2018-07-29 MED ORDER — FENTANYL CITRATE (PF) 100 MCG/2ML IJ SOLN
INTRAMUSCULAR | Status: DC | PRN
  Administered 2018-07-29 (×2): 50 ug via INTRAVENOUS

## 2018-07-29 MED ORDER — LIDOCAINE 20MG/ML (2%) 15 ML SYRINGE OPTIME: INTRAMUSCULAR | Status: DC | PRN

## 2018-07-29 MED ORDER — ACETAMINOPHEN 160 MG/5ML PO SOLN
650.0000 mg | Freq: Four times a day (QID) | ORAL | Status: DC
  Administered 2018-07-30 (×2): 650 mg via ORAL
  Filled 2018-07-29 (×2): qty 20.3

## 2018-07-29 MED ORDER — PHENYLEPHRINE 40 MCG/ML (10ML) SYRINGE FOR IV PUSH (FOR BLOOD PRESSURE SUPPORT)
PREFILLED_SYRINGE | INTRAVENOUS | Status: DC | PRN
  Administered 2018-07-29 (×2): 80 ug via INTRAVENOUS

## 2018-07-29 MED ORDER — OXYCODONE HCL 5 MG/5ML PO SOLN: 5.0000 mg | Freq: Once | ORAL | Status: DC | PRN

## 2018-07-29 MED ORDER — MEPERIDINE HCL 50 MG/ML IJ SOLN: 6.2500 mg | INTRAMUSCULAR | Status: DC | PRN

## 2018-07-29 MED ORDER — ONDANSETRON HCL 4 MG/2ML IJ SOLN
INTRAMUSCULAR | Status: DC | PRN
  Administered 2018-07-29: 4 mg via INTRAVENOUS

## 2018-07-29 MED ORDER — ONDANSETRON HCL 4 MG/2ML IJ SOLN
INTRAMUSCULAR | Status: AC
  Filled 2018-07-29: qty 2

## 2018-07-29 MED ORDER — ACETAMINOPHEN 500 MG PO TABS
1000.0000 mg | ORAL_TABLET | ORAL | Status: AC
  Administered 2018-07-29: 1000 mg via ORAL
  Filled 2018-07-29: qty 2

## 2018-07-29 MED ORDER — ROSUVASTATIN CALCIUM 10 MG PO TABS
10.0000 mg | ORAL_TABLET | Freq: Every day | ORAL | Status: DC
  Administered 2018-07-30: 10 mg via ORAL
  Filled 2018-07-29: qty 1

## 2018-07-29 MED ORDER — DEXAMETHASONE SODIUM PHOSPHATE 10 MG/ML IJ SOLN
INTRAMUSCULAR | Status: DC | PRN
  Administered 2018-07-29: 10 mg via INTRAVENOUS

## 2018-07-29 MED ORDER — APREPITANT 40 MG PO CAPS
40.0000 mg | ORAL_CAPSULE | ORAL | Status: AC
  Administered 2018-07-29: 40 mg via ORAL
  Filled 2018-07-29: qty 1

## 2018-07-29 MED ORDER — SODIUM CHLORIDE 0.9 % IV SOLN
2.0000 g | INTRAVENOUS | Status: AC
  Administered 2018-07-29: 2 g via INTRAVENOUS
  Filled 2018-07-29: qty 2

## 2018-07-29 MED ORDER — MORPHINE SULFATE (PF) 4 MG/ML IV SOLN
1.0000 mg | INTRAVENOUS | Status: DC | PRN
  Administered 2018-07-29: 2 mg via INTRAVENOUS
  Administered 2018-07-29: 3 mg via INTRAVENOUS
  Administered 2018-07-29: 2 mg via INTRAVENOUS
  Filled 2018-07-29 (×3): qty 1

## 2018-07-29 MED ORDER — OXYCODONE HCL 5 MG/5ML PO SOLN
5.0000 mg | ORAL | Status: DC | PRN
  Administered 2018-07-29: 5 mg via ORAL
  Filled 2018-07-29: qty 5

## 2018-07-29 MED ORDER — LACTATED RINGERS IV SOLN
INTRAVENOUS | Status: DC
  Administered 2018-07-29 (×2): via INTRAVENOUS

## 2018-07-29 MED ORDER — MIDAZOLAM HCL 2 MG/2ML IJ SOLN
INTRAMUSCULAR | Status: AC
  Filled 2018-07-29: qty 2

## 2018-07-29 MED ORDER — PROMETHAZINE HCL 25 MG/ML IJ SOLN
6.2500 mg | INTRAMUSCULAR | Status: DC | PRN
  Administered 2018-07-29: 12.5 mg via INTRAVENOUS

## 2018-07-29 MED ORDER — LIDOCAINE 2% (20 MG/ML) 5 ML SYRINGE
INTRAMUSCULAR | Status: AC
  Filled 2018-07-29: qty 5

## 2018-07-29 MED ORDER — ENSURE MAX PROTEIN PO LIQD
2.0000 [oz_av] | ORAL | Status: DC
  Administered 2018-07-30 (×2): 2 [oz_av] via ORAL

## 2018-07-29 MED ORDER — STERILE WATER FOR IRRIGATION IR SOLN
Status: DC | PRN
  Administered 2018-07-29: 1000 mL

## 2018-07-29 MED ORDER — SODIUM CHLORIDE (PF) 0.9 % IJ SOLN
INTRAMUSCULAR | Status: AC
  Filled 2018-07-29: qty 50

## 2018-07-29 MED ORDER — SUGAMMADEX SODIUM 500 MG/5ML IV SOLN
INTRAVENOUS | Status: DC | PRN
  Administered 2018-07-29: 400 mg via INTRAVENOUS

## 2018-07-29 MED ORDER — DEXAMETHASONE SODIUM PHOSPHATE 4 MG/ML IJ SOLN: 4.0000 mg | INTRAMUSCULAR | Status: DC

## 2018-07-29 MED ORDER — SUCCINYLCHOLINE CHLORIDE 200 MG/10ML IV SOSY
PREFILLED_SYRINGE | INTRAVENOUS | Status: AC
  Filled 2018-07-29: qty 10

## 2018-07-29 MED ORDER — ENOXAPARIN SODIUM 30 MG/0.3ML ~~LOC~~ SOLN
30.0000 mg | Freq: Two times a day (BID) | SUBCUTANEOUS | Status: DC
  Administered 2018-07-29 – 2018-07-30 (×2): 30 mg via SUBCUTANEOUS
  Filled 2018-07-29 (×2): qty 0.3

## 2018-07-29 MED ORDER — BUPIVACAINE-EPINEPHRINE (PF) 0.25% -1:200000 IJ SOLN
INTRAMUSCULAR | Status: AC
  Filled 2018-07-29: qty 30

## 2018-07-29 MED ORDER — CITALOPRAM HYDROBROMIDE 20 MG PO TABS
20.0000 mg | ORAL_TABLET | Freq: Every day | ORAL | Status: DC
  Administered 2018-07-30: 20 mg via ORAL
  Filled 2018-07-29: qty 1

## 2018-07-29 SURGICAL SUPPLY — 76 items
ADH SKN CLS APL DERMABOND .7 (GAUZE/BANDAGES/DRESSINGS) ×1
APL SWBSTK 6 STRL LF DISP (MISCELLANEOUS)
APPLICATOR COTTON TIP 6 STRL (MISCELLANEOUS) IMPLANT
APPLICATOR COTTON TIP 6IN STRL (MISCELLANEOUS)
APPLIER CLIP ROT 10 11.4 M/L (STAPLE)
APPLIER CLIP ROT 13.4 12 LRG (CLIP) ×2
APR CLP LRG 13.4X12 ROT 20 MLT (CLIP) ×1
APR CLP MED LRG 11.4X10 (STAPLE)
BLADE SURG SZ11 CARB STEEL (BLADE) ×2 IMPLANT
CABLE HIGH FREQUENCY MONO STRZ (ELECTRODE) ×1 IMPLANT
CHLORAPREP W/TINT 26ML (MISCELLANEOUS) ×3 IMPLANT
CLIP APPLIE ROT 10 11.4 M/L (STAPLE) IMPLANT
CLIP APPLIE ROT 13.4 12 LRG (CLIP) IMPLANT
COVER WAND RF STERILE (DRAPES) ×1 IMPLANT
DERMABOND ADVANCED (GAUZE/BANDAGES/DRESSINGS) ×1
DERMABOND ADVANCED .7 DNX12 (GAUZE/BANDAGES/DRESSINGS) ×1 IMPLANT
DEVICE SUT QUICK LOAD TK 5 (STAPLE) IMPLANT
DEVICE SUT TI-KNOT TK 5X26 (MISCELLANEOUS) IMPLANT
DEVICE SUTURE ENDOST 10MM (ENDOMECHANICALS) ×1 IMPLANT
DRAPE UTILITY XL STRL (DRAPES) ×4 IMPLANT
ELECT PENCIL ROCKER SW 15FT (MISCELLANEOUS) ×1 IMPLANT
ELECT REM PT RETURN 15FT ADLT (MISCELLANEOUS) ×2 IMPLANT
GAUZE SPONGE 4X4 12PLY STRL (GAUZE/BANDAGES/DRESSINGS) IMPLANT
GLOVE BIOGEL M 8.0 STRL (GLOVE) ×1 IMPLANT
GLOVE BIOGEL PI IND STRL 7.5 (GLOVE) ×1 IMPLANT
GLOVE BIOGEL PI IND STRL 8 (GLOVE) ×1 IMPLANT
GLOVE BIOGEL PI INDICATOR 7.5 (GLOVE) ×3
GLOVE BIOGEL PI INDICATOR 8 (GLOVE) ×1
GLOVE ECLIPSE 7.5 STRL STRAW (GLOVE) ×2 IMPLANT
GLOVE SURG SS PI 7.0 STRL IVOR (GLOVE) ×2 IMPLANT
GLOVE SURG SS PI 8.0 STRL IVOR (GLOVE) ×2 IMPLANT
GOWN STRL REUS W/TWL XL LVL3 (GOWN DISPOSABLE) ×8 IMPLANT
GRASPER SUT TROCAR 14GX15 (MISCELLANEOUS) IMPLANT
HOVERMATT SINGLE USE (MISCELLANEOUS) ×2 IMPLANT
KIT BASIN OR (CUSTOM PROCEDURE TRAY) ×2 IMPLANT
KIT TURNOVER KIT A (KITS) ×1 IMPLANT
MARKER SKIN DUAL TIP RULER LAB (MISCELLANEOUS) ×1 IMPLANT
NDL SPNL 22GX3.5 QUINCKE BK (NEEDLE) ×1 IMPLANT
NEEDLE SPNL 22GX3.5 QUINCKE BK (NEEDLE) ×2 IMPLANT
PACK UNIVERSAL I (CUSTOM PROCEDURE TRAY) ×2 IMPLANT
RELOAD ENDO STITCH 2.0 (ENDOMECHANICALS) ×4
RELOAD STAPLE 60 3.6 BLU REG (STAPLE) ×1 IMPLANT
RELOAD STAPLE 60 4.1 GRN THCK (STAPLE) ×1 IMPLANT
RELOAD STAPLER BLUE 60MM (STAPLE) ×4 IMPLANT
RELOAD STAPLER GOLD 60MM (STAPLE) ×1 IMPLANT
RELOAD STAPLER GREEN 60MM (STAPLE) ×1 IMPLANT
SCISSORS LAP 5X45 EPIX DISP (ENDOMECHANICALS) ×2 IMPLANT
SET IRRIG TUBING LAPAROSCOPIC (IRRIGATION / IRRIGATOR) ×2 IMPLANT
SET TUBE SMOKE EVAC HIGH FLOW (TUBING) ×2 IMPLANT
SHEARS HARMONIC ACE PLUS 45CM (MISCELLANEOUS) ×2 IMPLANT
SLEEVE ADV FIXATION 5X100MM (TROCAR) ×2 IMPLANT
SLEEVE GASTRECTOMY 36FR VISIGI (MISCELLANEOUS) ×2 IMPLANT
SOLUTION ANTI FOG 6CC (MISCELLANEOUS) ×2 IMPLANT
SPONGE LAP 18X18 RF (DISPOSABLE) ×2 IMPLANT
STAPLER ECHELON LONG 60 440 (INSTRUMENTS) ×2 IMPLANT
STAPLER RELOAD BLUE 60MM (STAPLE) ×8
STAPLER RELOAD GOLD 60MM (STAPLE) ×2
STAPLER RELOAD GREEN 60MM (STAPLE) ×2
SUT MNCRL AB 4-0 PS2 18 (SUTURE) ×2 IMPLANT
SUT RELOAD ENDO STITCH 2 48X1 (ENDOMECHANICALS) ×2
SUT SURGIDAC NAB ES-9 0 48 120 (SUTURE) IMPLANT
SUT VIC AB 0 BRD 54 (SUTURE) ×1 IMPLANT
SUTURE RELOAD END STTCH 2 48X1 (ENDOMECHANICALS) ×2 IMPLANT
SYR 10ML ECCENTRIC (SYRINGE) ×2 IMPLANT
SYR 20CC LL (SYRINGE) ×4 IMPLANT
SYSTEM WECK SHIELD CLOSURE (TROCAR) ×1 IMPLANT
TIP RIGID 35CM EVICEL (HEMOSTASIS) ×2 IMPLANT
TOWEL OR 17X26 10 PK STRL BLUE (TOWEL DISPOSABLE) ×2 IMPLANT
TOWEL OR NON WOVEN STRL DISP B (DISPOSABLE) ×2 IMPLANT
TROCAR ADV FIXATION 11X100MM (TROCAR) ×1 IMPLANT
TROCAR ADV FIXATION 12X100MM (TROCAR) ×1 IMPLANT
TROCAR ADV FIXATION 5X100MM (TROCAR) ×2 IMPLANT
TROCAR BLADELESS 15MM (ENDOMECHANICALS) ×2 IMPLANT
TROCAR BLADELESS OPT 5 100 (ENDOMECHANICALS) ×2 IMPLANT
TROCAR XCEL 12X100 BLDLESS (ENDOMECHANICALS) IMPLANT
TUBING CONNECTING 10 (TUBING) ×2 IMPLANT

## 2018-07-29 NOTE — Progress Notes (Signed)
Pt started drinking first 2 oz cup of water at 1830.

## 2018-07-29 NOTE — Op Note (Signed)
Rebecca Russo [id number] 1976/04/29 07/29/2018  Preoperative diagnosis: sleeve gastrectomy in progress  Postoperative diagnosis: Same   Procedure: Upper endoscopy   Surgeon: Susy Frizzle B. Daphine Deutscher  M.D., FACS   Anesthesia: Gen.   Indications for procedure: This patient was undergoing a sleeve gastrectomy by Dr. Raenette Rover.  Endo to look for bleeding and leaks.    Description of procedure: The endoscopy was placed in the mouth and into the oropharynx and under endoscopic vision it was advanced to the esophagogastric junction.  The pouch was insufflated and no bubbles were seen.   No bleeding or leaks were detected.  The scope was withdrawn without difficulty.     Matt B. Daphine Deutscher, MD, FACS General, Bariatric, & Minimally Invasive Surgery [facility], [place]

## 2018-07-29 NOTE — Transfer of Care (Signed)
Immediate Anesthesia Transfer of Care Note  Patient: Rebecca Russo  Procedure(s) Performed: LAPAROSCOPIC GASTRIC SLEEVE RESECTION, UPPER ENDO, ERAS Pathway (N/A Abdomen)  Patient Location: PACU  Anesthesia Type:General  Level of Consciousness: sedated  Airway & Oxygen Therapy: Patient Spontanous Breathing and Patient connected to face mask oxygen  Post-op Assessment: Report given to RN and Post -op Vital signs reviewed and stable  Post vital signs: Reviewed and stable  Last Vitals:  Vitals Value Taken Time  BP    Temp    Pulse 98 07/29/2018  1:05 PM  Resp 29 07/29/2018  1:05 PM  SpO2 99 % 07/29/2018  1:05 PM  Vitals shown include unvalidated device data.  Last Pain:  Vitals:   07/29/18 1006  TempSrc:   PainSc: 0-No pain         Complications: No apparent anesthesia complications

## 2018-07-29 NOTE — Anesthesia Postprocedure Evaluation (Signed)
Anesthesia Post Note  Patient: Rebecca Russo  Procedure(s) Performed: LAPAROSCOPIC GASTRIC SLEEVE RESECTION, UPPER ENDO, ERAS Pathway (N/A Abdomen)     Patient location during evaluation: PACU Anesthesia Type: General Level of consciousness: sedated and patient cooperative Pain management: pain level controlled Vital Signs Assessment: post-procedure vital signs reviewed and stable Respiratory status: spontaneous breathing Cardiovascular status: stable Anesthetic complications: no    Last Vitals:  Vitals:   07/29/18 1433 07/29/18 1447  BP:  139/79  Pulse: 77 76  Resp: 20 16  Temp:  36.8 C  SpO2: 97% 100%    Last Pain:  Vitals:   07/29/18 1447  TempSrc: Oral  PainSc:                  Lewie Loron

## 2018-07-29 NOTE — Discharge Instructions (Signed)
° ° ° °GASTRIC BYPASS/SLEEVE ° Home Care Instructions ° ° These instructions are to help you care for yourself when you go home. ° °Call: If you have any problems. °• Call [phone] and ask for the surgeon on call °• If you need immediate help, come to the ER at [facility].  °• Tell the ER staff that you are a new post-op gastric bypass or gastric sleeve patient °  °Signs and symptoms to report: • Severe vomiting or nausea °o If you cannot keep down clear liquids for longer than 1 day, call your surgeon  °• Abdominal pain that does not get better after taking your pain medication °• Fever over 100.4° F with chills °• Heart beating over 100 beats a minute °• Shortness of breath at rest °• Chest pain °•  Redness, swelling, drainage, or foul odor at incision (surgical) sites °•  If your incisions open or pull apart °• Swelling or pain in calf (lower leg) °• Diarrhea (Loose bowel movements that happen often), frequent watery, uncontrolled bowel movements °• Constipation, (no bowel movements for 3 days) if this happens: Pick one °o Milk of Magnesia, 2 tablespoons by mouth, 3 times a day for 2 days if needed °o Stop taking Milk of Magnesia once you have a bowel movement °o Call your doctor if constipation continues °Or °o Miralax  (instead of Milk of Magnesia) following the label instructions °o Stop taking Miralax once you have a bowel movement °o Call your doctor if constipation continues °• Anything you think is not normal °  °Normal side effects after surgery: • Unable to sleep at night or unable to focus °• Irritability or moody °• Being tearful (crying) or depressed °These are common complaints, possibly related to your anesthesia medications that put you to sleep, stress of surgery, and change in lifestyle.  This usually goes away a few weeks after surgery.  If these feelings continue, call your primary care doctor. °  °Wound Care: You may have surgical glue, steri-strips, or staples over your incisions after  surgery °• Surgical glue:  Looks like a clear film over your incisions and will wear off a little at a time °• Steri-strips: Strips of tape over your incisions. You may notice a yellowish color on the skin under the steri-strips. This is used to make the   steri-strips stick better. Do not pull the steri-strips off - let them fall off °• Staples: Staples may be removed before you leave the hospital °o If you go home with staples, call [facility], [phone] at for an appointment with your surgeon’s nurse to have staples removed 10 days after surgery. °• Showering: You may shower two (2) days after your surgery unless your surgeon tells you differently °o Wash gently around incisions with warm soapy water, rinse well, and gently pat dry  °o No tub baths until staples are removed, steri-strips fall off or glue is gone.  °  °Medications: • Medications should be liquid or crushed if larger than the size of a dime °• Extended release pills (medication that release a little bit at a time through the day) should NOT be crushed or cut. (examples include XL, ER, DR, SR) °• Depending on the size and number of medications you take, you may need to space (take a few throughout the day)/change the time you take your medications so that you do not over-fill your pouch (smaller stomach) °• Make sure you follow-up with your primary care doctor to   make medication changes needed during rapid weight loss and life-style changes °• If you have diabetes, follow up with the doctor that orders your diabetes medication(s) within one week after surgery and check your blood sugar regularly. °• Do not drive while taking prescription pain medication  °• It is ok to take Tylenol by the bottle instructions with your pain medicine or instead of your pain medicine as needed.  DO NOT TAKE NSAIDS (EXAMPLES OF NSAIDS:  IBUPROFREN/ NAPROXEN)  °Diet:                    First 2 Weeks ° You will see the dietician t about two (2) weeks  after your surgery. The dietician will increase the types of foods you can eat if you are handling liquids well: °• If you have severe vomiting or nausea and cannot keep down clear liquids lasting longer than 1 day, call your surgeon @ ([phone]) °Protein Shake °• Drink at least 2 ounces of shake 5-6 times per day °• Each serving of protein shakes (usually 8 - 12 ounces) should have: °o 15 grams of protein  °o And no more than 5 grams of carbohydrate  °• Goal for protein each day: °o Men = 80 grams per day °o Women = 60 grams per day °• Protein powder may be added to fluids such as non-fat milk or Lactaid milk or unsweetened Soy/Almond milk (limit to 35 grams added protein powder per serving) ° °Hydration °• Slowly increase the amount of water and other clear liquids as tolerated (See Acceptable Fluids) °• Slowly increase the amount of protein shake as tolerated  °•  Sip fluids slowly and throughout the day.  Do not use straws. °• May use sugar substitutes in small amounts (no more than 6 - 8 packets per day; i.e. Splenda) ° °Fluid Goal °• The first goal is to drink at least 8 ounces of protein shake/drink per day (or as directed by the nutritionist); some examples of protein shakes are Syntrax Nectar, Adkins Advantage, EAS Edge HP, and Unjury. See handout from pre-op Bariatric Education Class: °o Slowly increase the amount of protein shake you drink as tolerated °o You may find it easier to slowly sip shakes throughout the day °o It is important to get your proteins in first °• Your fluid goal is to drink 64 - 100 ounces of fluid daily °o It may take a few weeks to build up to this °• 32 oz (or more) should be clear liquids  °And  °• 32 oz (or more) should be full liquids (see below for examples) °• Liquids should not contain sugar, caffeine, or carbonation ° °Clear Liquids: °• Water or Sugar-free flavored water (i.e. Fruit H2O, Propel) °• Decaffeinated coffee or tea (sugar-free) °• Crystal Lite, Wyler’s Lite,  Minute Maid Lite °• Sugar-free Jell-O °• Bouillon or broth °• Sugar-free Popsicle:   *Less than 20 calories each; Limit 1 per day ° °Full Liquids: °Protein Shakes/Drinks + 2 choices per day of other full liquids °• Full liquids must be: °o No More Than 15 grams of Carbs per serving  °o No More Than 3 grams of Fat per serving °• Strained low-fat cream soup (except Cream of Potato or Tomato) °• Non-Fat milk °• Fat-free Lactaid Milk °• Unsweetened Soy Or Unsweetened Almond Milk °• Low Sugar yogurt (Dannon Lite & Fit, Greek yogurt; Oikos Triple Zero; Chobani Simply 100; Yoplait 100 calorie Greek - No Fruit on the Bottom) ° °  °Vitamins   and Minerals • Start 1 day after surgery unless otherwise directed by your surgeon °• 2 Chewable Bariatric Specific Multivitamin / Multimineral Supplement with iron (Example: Bariatric Advantage Multi EA) °• Chewable Calcium with Vitamin D-3 °(Example: 3 Chewable Calcium Plus 600 with Vitamin D-3) °o Take 500 mg three (3) times a day for a total of 1500 mg each day °o Do not take all 3 doses of calcium at one time as it may cause constipation, and you can only absorb 500 mg  at a time  °o Do not mix multivitamins containing iron with calcium supplements; take 2 hours apart °• Menstruating women and those with a history of anemia (a blood disease that causes weakness) may need extra iron °o Talk with your doctor to see if you need more iron °• Do not stop taking or change any vitamins or minerals until you talk to your dietitian or surgeon °• Your Dietitian and/or surgeon must approve all vitamin and mineral supplements °  °Activity and Exercise: Limit your physical activity as instructed by your doctor.  It is important to continue walking at home.  During this time, use these guidelines: °• Do not lift anything greater than ten (10) pounds for at least two (2) weeks °• Do not go back to work or drive until your surgeon says you can °• You may have sex when you feel comfortable  °o It is  VERY important for female patients to use a reliable birth control method; fertility often increases after surgery  °o All hormonal birth control will be ineffective for 30 days after surgery due to medications given during surgery a barrier method must be used. °o Do not get pregnant for at least 18 months °• Start exercising as soon as your doctor tells you that you can °o Make sure your doctor approves any physical activity °• Start with a simple walking program °• Walk 5-15 minutes each day, 7 days per week.  °• Slowly increase until you are walking 30-45 minutes per day °Consider joining our BELT program. [phone] or email [email or web address] °  °Special Instructions Things to remember: °• Use your CPAP when sleeping if this applies to you ° °• [facility] has two free Bariatric Surgery Support Groups that meet monthly °o The 3rd Thursday of each month, 6 pm, [facility]  °o The 2nd Friday of each month, 11:45 am in the private dining room in the basement of [facility] °• It is very important to keep all follow up appointments with your surgeon, dietitian, primary care physician, and behavioral health practitioner °• Routine follow up schedule with your surgeon include appointments at 2-3 weeks, 6-8 weeks, 6 months, and 1 year at a minimum.  Your surgeon may request to see you more often.   °o After the first year, please follow up with your bariatric surgeon and dietitian at least once a year in order to maintain best weight loss results °[facility]: [phone] °[facility]: [phone] °Bariatric Nurse Coordinator: [phone] °  °   Reviewed and Endorsed  °by [facility] Patient Education Committee, June, 2016 °Edits Approved: Aug, 2018 ° ° ° °

## 2018-07-29 NOTE — Progress Notes (Signed)
PHARMACY CONSULT FOR:  Risk Assessment for Post-Discharge VTE Following Bariatric Surgery  Post-Discharge VTE Risk Assessment: This patient's probability of 30-day post-discharge VTE is increased due to the factors marked:   Female    Age >/=60 years  x  BMI >/=50 kg/m2    CHF    Dyspnea at Rest    Paraplegia   x Non-gastric-band surgery    Operation Time >/=3 hr    Return to OR     Length of Stay >/= 3 d   Predicted probability of 30-day post-discharge VTE: 0.27%  Other patient-specific factors to consider:   Recommendation for Discharge: No pharmacologic prophylaxis post-discharge   Rebecca Russo is a 43 y.o. female who underwent  LAPAROSCOPIC GASTRIC SLEEVE RESECTION on 07/29/2018   Case start: 1100 Case end: 1256   No Known Allergies  Patient Measurements: Height: 5' 10.5" (179.1 cm) Weight: (!) 363 lb 4 oz (164.8 kg) IBW/kg (Calculated) : 69.65 Body mass index is 51.38 kg/m.  No results for input(s): WBC, HGB, HCT, PLT, APTT, CREATININE, LABCREA, CREATININE, CREAT24HRUR, MG, PHOS, ALBUMIN, PROT, ALBUMIN, AST, ALT, ALKPHOS, BILITOT, BILIDIR, IBILI in the last 72 hours. Estimated Creatinine Clearance: 141.8 mL/min (by C-G formula based on SCr of 0.87 mg/dL).    Past Medical History:  Diagnosis Date  . Aphasia complicating stroke   . Bilateral swelling of feet   . Chronic systolic heart failure (HCC) 06/09/2016   a - Prob nonischemic //  Echo 10/17: EF 40-45, diffuse HK, mild LAE, PFO closure device without residual shunt  //  Could not tol cMRI // Echo 4/18: EF 55, normal wall motion, normal diastolic function, trivial MR, PFO device in place without residual shunt, PASP 24  . Constipation   . Eczema   . H/O varicella   . Hemiplegia affecting dominant side (HCC)   . History of ischemic left MCA stroke    weakness on right side  . Increased BMI 12/17/08  . LAE (left atrial enlargement) 04/05/2018   Mod, noted on ECHO  . Migraines   . Morbid obesity (HCC)   .  PFO with atrial septal aneurysm 06/04/2014   a - s/p transcatheter closure in 2/16 with 25 mm Amplatzer Cribiform ASO device // b - Echo 10/17:  EF 40-45, diffuse HK, mild LAE, PFO closure device without residual shunt   . Stroke (cerebrum) (HCC)   . Vitamin D deficiency       Arley Phenix RPh 07/29/2018, 1:31 PM Pager [phone]

## 2018-07-29 NOTE — H&P (Signed)
History of Present Illness Sharlet Salina T. Cranston Koors MD; 07/18/2018 9:44 AM) The patient is a 43 year old female who presents with obesity. Patient presents for her preoperative visit prior to planned laparoscopic sleeve gastrectomy. 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 The patient gives a history of progressive obesity since early adulthood despite multiple attempts at medical management. She has been through multiple attempts at diet and exercise programs with minimal temporary results. This included Fen/Phen in years past. She has a significant history of a stroke in 2016 which was felt secondary to a patent foramen ovale that has been corrected. She had significant aphasia following this which has generally resolved but still has some mild aphasia. Also significant surgical history of partial colectomy for colonic inertia done laparoscopically at [facility]. Obesity has been affecting the patient in a number of ways including progressive difficulties with fatigue and struggling with routine activities of daily living. Other than her CVA which had a specific etiology as above she has been free of significant comorbidities. She is on a cholesterol-lowering agent empirically due to her history of CVA. She is very concerned about her long-term health going forward. After her initial visit and consultation we elected to proceed with sleeve gastrectomy. She successfully completed her preoperative workup. No concerns on psychologic or nutrition evaluation. Upper GI series was normal. Lab work unremarkable. She had a preoperative cardiac clearance. She will stop her Plavix 1 week prior to surgery.   Problem List/Past Medical Sharlet Salina T. Ciro Tashiro, MD; 07/18/2018 9:44 AM) NUTRITIONAL DEFICIENCY, UNSPECIFIED (E63.9)  MORBID OBESITY WITH BMI OF 50.0-59.9, ADULT (E66.01)  PRE-OP CHEST EXAM (G98.421)   Past Surgical History Sharlet Salina T. Saulo Anthis, MD; 07/18/2018 9:44 AM) Cesarean Section - 1   Colon Removal - Partial  Gallbladder Surgery - Laparoscopic   Diagnostic Studies History Sharlet Salina T. Rook Maue, MD; 07/18/2018 9:44 AM) Colonoscopy  >10 years ago Mammogram  within last year Pap Smear  1-5 years ago  Allergies Kristen Cardinal, CMA; 07/18/2018 9:33 AM) No Known Allergies [07/18/2018]: No Known Drug Allergies [12/19/2017]: Allergies Reconciled   Medication History Kristen Cardinal, CMA; 07/18/2018 9:34 AM) Vitamin D (Oral) Specific strength unknown - Active. Rosuvastatin Calcium (5MG  Tablet, Oral) Active. Clopidogrel Bisulfate (75MG  Tablet, Oral) Active. Symbicort (160-4.5MCG/ACT Aerosol, Inhalation) Active. Crestor (10MG  Tablet, Oral) Active. Mirena (52 MG) (20MCG/24HR IUD, Intrauterine) Active. Medications Reconciled  Social History Sharlet Salina T. Helaine Yackel, MD; 07/18/2018 9:44 AM) Alcohol use  Occasional alcohol use. Caffeine use  Tea. No drug use  Tobacco use  Never smoker.  Family History Sharlet Salina T. Ariauna Farabee, MD; 07/18/2018 9:44 AM) Alcohol Abuse  Father. Cerebrovascular Accident  Father. Diabetes Mellitus  Father, Mother.  Pregnancy / Birth History Sharlet Salina T. Doyle Tegethoff, MD; 07/18/2018 9:44 AM) Age at menarche  14 years. Contraceptive History  Oral contraceptives. Gravida  2 Maternal age  68-20 Para  2 Regular periods   Other Problems Sharlet Salina T. Bernardo Brayman, MD; 07/18/2018 9:44 AM) Cerebrovascular Accident  Cholelithiasis  Other disease, cancer, significant illness   Vitals (Sabrina Canty CMA; 07/18/2018 9:35 AM) 07/18/2018 9:34 AM Weight: 366.5 lb Height: 70in Body Surface Area: 2.7 m Body Mass Index: 52.59 kg/m  Temp.: 97.27F(Temporal)  Pulse: 131 (Regular)  P.OX: 96% (Room air) BP: 128/70 (Sitting, Left Arm, Standard)       Physical Exam Sharlet Salina T. Bracken Moffa MD; 07/18/2018 9:44 AM) The physical exam findings are as follows: Note:General: Alert, morbidly obese African-American female, in no distress Skin:  Warm and dry without rash or infection. HEENT: No palpable masses  or thyromegaly. Sclera nonicteric. Lungs: Breath sounds clear and equal. No wheezing or increased work of breathing. Cardiovascular: Regular rate and rhythm without murmer. No JVD or edema. Abdomen: Nondistended. Soft and nontender. No masses palpable. No organomegaly. No palpable hernias. Extremities: No edema or joint swelling or deformity. No chronic venous stasis changes. Neurologic: Alert and fully oriented. Gait normal. No focal weakness. Psychiatric: Normal mood and affect. Thought content appropriate with normal judgement and insight    Assessment & Plan Sharlet Salina T. Carmelina Balducci MD; 07/18/2018 9:45 AM) MORBID OBESITY WITH BMI OF 50.0-59.9, ADULT (E66.01) Impression: Patient with progressive morbid obesity unresponsive to multiple efforts at medical management who presents with a BMI of 51 and comorbidities of hyperlipidemia, history of CVA status post PFO correction. I believe there would be very significant medical benefit from surgical weight loss. After our discussion of surgical options currently available the patient has decided to proceed with laparoscopic sleeve gastrectomy due to the reasons above. We have discussed the nature of the problem and the risks of remaining morbidly obese. We discussed laparoscopic sleeve gastrectomy in detail including the nature of the procedure, expected hospitalization and recovery, and major risks of anesthetic complications, bleeding, blood clots and pulmonary emboli, leakage and infection and long-term risks of stricture, , bowel obstruction, reflux, nutritional deficiencies, and failure to lose weight or weight regain. We discussed these problems could lead to death. We discussed that the procedure is not reversible. We discussed possible need for conversion to gastric bypass or other procedure. The patient was given a complete consent form to review and all questions were answered.  Preoperative workup unremarkable with negative upper GI. She will stop her Plavix 1 week prior to surgery.

## 2018-07-29 NOTE — Op Note (Signed)
Preoperative Diagnosis: Morbid Obesity,  HO Stroke, CVA, Chronic Systolic Heart Failure, Aphasia, Hyperlipidemia  Postoprative Diagnosis: Morbid Obesity,  HO Stroke, CVA, Chronic Systolic Heart Failure, Aphasia, Hyperlipidemia  Procedure: Procedure(s): LAPAROSCOPIC GASTRIC SLEEVE RESECTION, UPPER ENDO, ERAS Pathway   Surgeon: Glenna Fellows T   Assistants: Luretha Murphy  Anesthesia:  General endotracheal anesthesia  Indications: Patient with progressive morbid obesity unresponsive to multiple efforts at medical management who presents with a BMI of 51 and comorbidities of hyperlipidemia, history of CVA status post PFO correction.  After extensive preoperative work-up and consultation detailed elsewhere we have elected to proceed with laparoscopic sleeve gastrectomy for treatment of her morbid obesity.    Procedure Detail: Patient was brought to the operating room, placed in supine position on the operating table, and general endotracheal anesthesia induced.  She had received preoperative IV antibiotics and subcutaneous heparin.  PAS were in place.  The abdomen was widely sterilely prepped and draped.  Patient timeout was performed and correct procedure verified.  Access was obtained with a 5 mm Optiview trocar in the left upper quadrant and pneumoperitoneum established.  There were noted to be some omental adhesions to the anterior abdominal wall from her previous laparoscopic colectomy.  A 5 mm trocar was placed more laterally in the left upper quadrant.  Through this site several isolated areas of omental adhesion around the falciform ligament and the umbilicus were taken down with the harmonic scalpel.  I then placed a 5 mm trocar laterally in the right upper quadrant, a 15 mm trocar near the base of the falciform ligament and a 5 mm trocar just above the left of the umbilicus for the camera port.  We examined the area of adhesio lysis and there was a small area of whitening from the  harmonic scalpel and possibly slight serosal defect of a loop of small bowel that had not been seen from the previous ankle taking down the adhesions.  There was clearly no full-thickness injury but I felt that this should be sutured.  I replaced the lateral 5 mm left upper quadrant trocar with a 12 mm trocar and the small area was sutured with 2 interrupted 2-0 Vicryl sutures inverting completely covering the area.  Following this the patient was placed in steep reverse Trendelenburg and through a 5 mm subxiphoid site the [facility] retractor was placed in the left lobe of the liver elevated with excellent exposure of the stomach and hiatus.  The dissection was begun along the mid greater curve dissecting short gastrics with harmonic scalpel and the lesser sac entered.  Dissection progressed proximally along the greater curve dividing short gastrics and separating the fundus from some minor attachments to the spleen.  The left crus was fully dissected and we assured the stomach was completely mobilized up to the hiatus.  No evidence of hiatal hernia.  We then continued the gastric dissection distally along the greater curve mobilizing the stomach noisier about 5 cm from the pylorus.  This point the stomach was completely freed along its lesser curve vasculature with no posterior attachments.  The VisiG 66 French tube was passed orally and positioned along the lesser curve liquids to the pylorus and placed on suction.  The sleeve was begun with an initial firing of the green load 60 mm stapler beginning about 5 cm from the pylorus and angling well away from the incisura.  On the next firing with the patient having a very short upper abdomen and with standing trocar placement I did  not have a good angle with a second firing.  I replaced the 5 mm camera port just above the left of the umbilicus with a 15 trocar for the stapler.  This allowed good angle for a second firing of the gold load stapler to carry the staple  line past the incisura leaving significant extra room around the VisiG at that point. The sleeve was continued then with for further firings of the 60 mm blue load stapler.  The staple line gradually and closer to the VISI time but not tight against it and the final firing completing the sleeve at right angles to the gastric wall just lateral to the esophageal fat pad.  The sleeve was insufflated tensely with air and appeared symmetrical with no twisting or narrowing and there was no evidence of leak.  The VISI G-tube was removed.  Dr. Daphine Deutscher performed upper endoscopy again showing no leak or narrowing or active bleeding.  There was some mild oozing along the distal staple line which was controlled with clips.  The staple line was coated with Eviseal as well as the small bowel sutured area.  Bilateral T AP block was performed with dilute Exparel.  The Nathanson retractor was removed under direct vision.  All CO2 was evacuated and trochars removed.  Skin incisions were closed with subcuticular 4-0 Monocryl with Dermabond.  Sponge needle and instrument counts were correct.    Findings: As above  Estimated Blood Loss:  less than 50 mL         Drains: None  Blood Given: none          Specimens: Greater curvature of stomach        Complications:  * No complications entered in OR log *         Disposition: PACU - hemodynamically stable.         Condition: stable

## 2018-07-29 NOTE — Interval H&P Note (Signed)
History and Physical Interval Note:  07/29/2018 9:49 AM  Rebecca Russo  has presented today for surgery, with the diagnosis of Morbid Obesity,  H/O Stroke, CVA, Chronic Systolic Heart Failure, Aphasia, Hyperlipidemia.  The various methods of treatment have been discussed with the patient and family. After consideration of risks, benefits and other options for treatment, the patient has consented to  Procedure(s): LAPAROSCOPIC GASTRIC SLEEVE RESECTION, UPPER ENDO, ERAS Pathway (N/A) as a surgical intervention.  The patient's history has been reviewed, patient examined, no change in status, stable for surgery.  I have reviewed the patient's chart and labs.  Questions were answered to the patient's satisfaction.     Lorne Skeens Diontae Route

## 2018-07-29 NOTE — Anesthesia Procedure Notes (Signed)
Procedure Name: Intubation Date/Time: 07/29/2018 10:43 AM Performed by: Lind Covert, CRNA Pre-anesthesia Checklist: Patient identified, Emergency Drugs available, Suction available, Patient being monitored and Timeout performed Patient Re-evaluated:Patient Re-evaluated prior to induction Oxygen Delivery Method: Circle system utilized Preoxygenation: Pre-oxygenation with 100% oxygen Induction Type: IV induction Ventilation: Mask ventilation without difficulty Laryngoscope Size: Mac and 4 Grade View: Grade I Tube type: Oral Tube size: 7.0 mm Number of attempts: 1 Airway Equipment and Method: Stylet Placement Confirmation: ETT inserted through vocal cords under direct vision,  positive ETCO2 and breath sounds checked- equal and bilateral Secured at: 22 cm Tube secured with: Tape Dental Injury: Teeth and Oropharynx as per pre-operative assessment

## 2018-07-30 ENCOUNTER — Encounter (HOSPITAL_COMMUNITY): Payer: Self-pay | Admitting: General Surgery

## 2018-07-30 LAB — CBC WITH DIFFERENTIAL/PLATELET
Abs Immature Granulocytes: 0.09 10*3/uL — ABNORMAL HIGH (ref 0.00–0.07)
Basophils Absolute: 0 10*3/uL (ref 0.0–0.1)
Basophils Relative: 0 %
Eosinophils Absolute: 0 10*3/uL (ref 0.0–0.5)
Eosinophils Relative: 0 %
HCT: 33.5 % — ABNORMAL LOW (ref 36.0–46.0)
Hemoglobin: 10.4 g/dL — ABNORMAL LOW (ref 12.0–15.0)
Immature Granulocytes: 1 %
Lymphocytes Relative: 11 %
Lymphs Abs: 1.7 10*3/uL (ref 0.7–4.0)
MCH: 29.2 pg (ref 26.0–34.0)
MCHC: 31 g/dL (ref 30.0–36.0)
MCV: 94.1 fL (ref 80.0–100.0)
Monocytes Absolute: 0.6 10*3/uL (ref 0.1–1.0)
Monocytes Relative: 4 %
NEUTROS ABS: 13.4 10*3/uL — AB (ref 1.7–7.7)
Neutrophils Relative %: 84 %
Platelets: 381 10*3/uL (ref 150–400)
RBC: 3.56 MIL/uL — AB (ref 3.87–5.11)
RDW: 13.5 % (ref 11.5–15.5)
WBC: 15.9 10*3/uL — ABNORMAL HIGH (ref 4.0–10.5)
nRBC: 0 % (ref 0.0–0.2)

## 2018-07-30 MED ORDER — OXYCODONE HCL 5 MG/5ML PO SOLN: 5.0000 mg | ORAL | 0 refills | Status: DC | PRN

## 2018-07-30 MED ORDER — CLOPIDOGREL BISULFATE 75 MG PO TABS: 75.0000 mg | ORAL_TABLET | Freq: Every day | ORAL | 0 refills | Status: DC

## 2018-07-30 MED FILL — CLOPIDOGREL 75 MG TABLET: 75 | 30 days supply | Qty: 30 | Fill #0

## 2018-07-30 MED FILL — AIMOVIG 70 MG/ML SOAJ: 70 | 30 days supply | Qty: 1 | Fill #0

## 2018-07-30 NOTE — Discharge Summary (Signed)
  Patient ID: Rebecca Russo [id number] y.o. 04/13/1976  07/29/2018  Discharge date and time: 07/30/2018   Admitting Physician: Mariella Saa  Discharge Physician: Mariella Saa  Admission Diagnoses: Morbid Obesity,  HO Stroke, CVA, Chronic Systolic Heart Failure, Aphasia, Hyperlipidemia  Discharge Diagnoses: Same  Operations: Procedure(s): LAPAROSCOPIC GASTRIC SLEEVE RESECTION, UPPER ENDO, ERAS Pathway  Admission Condition: good  Discharged Condition: good  Indication for Admission: Patient with progressive morbid obesity unresponsive to multiple efforts at medical management who presents with a BMI of 51 and comorbidities of hyperlipidemia, history of CVA status post PFO correction.  After extensive preoperative work-up and consultation detailed elsewhere we have elected to proceed with laparoscopic sleeve gastrectomy for treatment of her morbid obesity  Hospital Course: On the morning of admission the patient underwent an uneventful laparoscopic sleeve gastrectomy.  She tolerated the procedure well.  The following morning she denies pain or nausea.  She is ambulatory.  Has tolerated water well and is beginning protein shakes.  Abdomen is soft and nontender.  Incisions are clean and dry.  Plan is for discharge later today if tolerating fluids well.    Disposition: Home  Patient Instructions:  Allergies as of 07/30/2018   No Known Allergies     Medication List    TAKE these medications   citalopram 20 MG tablet Commonly known as:  CeleXA Take 1 tablet (20 mg total) by mouth daily.   clopidogrel 75 MG tablet Commonly known as:  PLAVIX Take 1 tablet (75 mg total) by mouth daily. Do not start until March 18 What changed:  additional instructions   Erenumab-aooe 70 MG/ML Soaj Commonly known as:  Aimovig Inject 70 mg into the skin every 30 (thirty) days.   furosemide 20 MG tablet Commonly known as:  LASIX Take 1 tablet (20 mg total) by mouth daily. Notes  to patient:  Monitor Blood Pressure Daily and keep a log for primary care physician.  Monitor for symptoms of dehydration.  You may need to make changes to your medications with rapid weight loss.     levonorgestrel 20 MCG/24HR IUD Commonly known as:  MIRENA 1 each by Intrauterine route once. IUD place by GYN 05/2018, needs to be removed in 5 years 05/2023.   oxyCODONE 5 MG/5ML solution Commonly known as:  ROXICODONE Take 5 mLs (5 mg total) by mouth every 4 (four) hours as needed for moderate pain or severe pain.   potassium chloride 10 MEQ tablet Commonly known as:  K-DUR Take 1 tablet (10 mEq total) by mouth daily.   rosuvastatin 10 MG tablet Commonly known as:  Crestor Take 1 tablet (10 mg total) by mouth daily.   traMADol 50 MG tablet Commonly known as:  ULTRAM Take 1-2 tablets (50-100 mg total) by mouth every 6 (six) hours as needed (headache).       Activity: activity as tolerated Diet: Bariatric protein shakes Wound Care: none needed  Follow-up:  With [facility] Burd in 3 weeks.  Signed: Mariella Saa MD, FACS  07/30/2018, 7:50 AM

## 2018-07-30 NOTE — Plan of Care (Signed)

## 2018-07-30 NOTE — Progress Notes (Signed)

## 2018-07-31 ENCOUNTER — Other Ambulatory Visit: Payer: Self-pay | Admitting: *Deleted

## 2018-07-31 NOTE — Patient Outreach (Signed)
[facility]) Care Management  07/31/2018  Rebecca Russo 1975/12/17 [id number]    Transition of care telephone call  Referral received:  07/30/2018 Initial outreach:  07/30/2018 Surgery/procedure date:   07/29/2018 Insurance:      Initial unsuccessful telephone call to patient's preferred number in order to complete transition of care assessment; no answer, left HIPAA compliant voicemail message requesting return call.   Objective: Per the electronic medical record, Rebecca Russo  was hospitalized at [facility] from 07/29/2018-07/30/2018 for Lap gastric sleeve resection. Comorbidities include: morbid obesity with body mass 59.9 in, non-ischemic cardiomyopathy, CVA and hyperlipidemia. She was discharged to home on 07/30/2018 without the need for home health services or durable medical equipment per the discharge summary.    Plan: If no return call from patient by the end of business day today, this RNCM will route unsuccessful outreach letter with [facility] Care Management pamphlet and 24 hour Nurse Advice Line Magnet to [facility] Care Management clinical pool to be mailed to patient's home address. This RNCM will attempt another outreach within 4 business days.  Elliot Cousin, RN Care Management Coordinator [facility] Main Office [phone]

## 2018-08-02 ENCOUNTER — Telehealth (HOSPITAL_COMMUNITY): Payer: Self-pay

## 2018-08-05 ENCOUNTER — Other Ambulatory Visit: Payer: Self-pay | Admitting: *Deleted

## 2018-08-05 NOTE — Patient Outreach (Signed)
[facility]) Care Management  08/05/2018  [name] 04-22-1976 [id number]    Transition of care telephone call  Referral received: 07/31/2018 Initial outreach: 07/31/2018 Surgery/procedure date:  Insurance: [employer]    Initial unsuccessful telephone call to patient's preferred number in order to complete transition of care assessment; no answer, left HIPAA compliant voicemail message requesting return call.   Objective: Per the electronic medical record, Rebecca Russo  was hospitalized at [facility] from 07/29/2018-07/30/2018 for lap gastric sleeve resection Comorbidities include:  Morbid obesity,w/ody mass 59.9 in, non-ischemic cardiomyopathy. She was discharged to home on 07/30/18 with the need for home health services per the discharge summary.    Plan: If no return call from patient by the end of business day today, this RNCM will route unsuccessful outreach letter with [facility] Care Management pamphlet and 24 hour Nurse Advice Line Magnet to [facility] Care [facility] clinical pool to be mailed to patient's home address. This RNCM will attempt another outreach within 4 business days.  Elliot Cousin, RN Care Management Coordinator [facility] Main Office [phone]

## 2018-08-05 NOTE — Telephone Encounter (Signed)
Patient called to discuss post bariatric surgery follow up questions.  See below:   1.  Tell me about your pain and pain management?has taken all prescribed pain medication from discharge, pain is described as mild crampy pain when drinking.  Discussed not drinking too fast, too much, or hot/cold temps.  2.  Let's talk about fluid intake.  How much total fluid are you taking in?50-60 ounces  3.  How much protein have you taken in the last 2 days?60 grams  4.  Have you had nausea?  Tell me about when have experienced nausea and what you did to help?denies nausea  5.  Has the frequency or color changed with your urine?urine light in color  6.  Tell me what your incisions look like?no problems  7.  Have you been passing gas? BM?passing gas had bm  8.  If a problem or question were to arise who would you call?  Do you know contact numbers for [facility], [facility], and [facility]?aware of how to contact all services  9.  How has the walking going?walking regularly  10.  How are your vitamins and calcium going?  How are you taking them?MVI without difficulty calcium 3 times per day

## 2018-08-08 ENCOUNTER — Other Ambulatory Visit: Payer: Self-pay | Admitting: *Deleted

## 2018-08-08 NOTE — Patient Outreach (Signed)
[facility]) Care Management  08/08/2018  Rebecca Russo Apr 19, 1976 [id number]    Transition of care telephone call  Referral received: 07/31/2018 Initial outreach:  07/31/2018 Insurance: [place]    Initial unsuccessful telephone call to patient's preferred number in order to complete transition of care assessment; no answer, left HIPAA compliant voicemail message requesting return call.   Objective: Per the electronic medical record, Rebecca Russo  was hospitalized at [facility] from 07/29/2018-07/30/2018 for Lap gastric sleeve resection . Comorbidities include: Morbid obesity, non-ischemic cardiomyopathy. She was discharged to home on 07/30/2018 without the need for home health services or durable medical equipment per the discharge summary.    Plan: If no return call from patient by 08/13/2018 will close this case. Note outreach letter sent with no response at this time.  Elliot Cousin, RN Care Management Coordinator [facility] Main Office [phone]

## 2018-08-13 ENCOUNTER — Other Ambulatory Visit: Payer: Self-pay | Admitting: *Deleted

## 2018-08-13 ENCOUNTER — Other Ambulatory Visit: Payer: Self-pay

## 2018-08-13 ENCOUNTER — Encounter: Payer: 59 | Attending: General Surgery | Admitting: Dietician

## 2018-08-13 ENCOUNTER — Encounter: Payer: Self-pay | Admitting: Dietician

## 2018-08-13 VITALS — Wt 348.0 lb

## 2018-08-13 DIAGNOSIS — E669 Obesity, unspecified: Secondary | ICD-10-CM | POA: Insufficient documentation

## 2018-08-13 NOTE — Progress Notes (Signed)
Bariatric Follow-Up Start time: 11:15am End Time: 11:45am  2 Week Post-Operative Nutrition Visit  Patient was seen on 08/13/2018 for Post-[facility]ative [facility]. Today was a 1 on 1 appointment vs the typical 2 week Post-Op Class.   Surgery date: 07/29/2018 Surgery type: Sleeve Gastrectomy Start weight at [facility]: 355.2 lbs (02/06/2018) Weight today: 348.1 lbs Weight change: -11.6 lbs (since previous visit 03/25/2018)  TANITA  BODY COMP RESULTS  08/13/2018   BMI (kg/m^2) 50.1   Fat Mass (lbs) 170.7   Fat Free Mass (lbs) 175.3   Total Body Water (lbs) 137.8   Body Composition Scale Total Body Fat:  49.3% Visceral Fat: 19 Fat-Free Mass:  50.6%  Total Body Water:  39.8%  Muscle-Mass: 37.4 lbs  Food & Nutrition Related Hx Dietary Hx: Pt drinks water, protein shakes (Premier Protein 20g), Powerade zero, and chicken broth  Supplements: Celebrate MVI, calcium  Estimated Daily Fluid Intake: 50-60 oz Estimated Daily Protein Intake: 50+ g  Physical Activity  Current average weekly physical activity: walking daily    Post-Op Goals Changes in vision: no Changes to mood/headaches: no Hair loss/changes to skin/nails: no Difficulty focusing/concentrating: no Sweating: no Dizziness/lightheadedness: no Palpitations: no  Carbonated/caffeinated beverages: no N/V/D/C/Gas: no Abdominal pain: no  The following the learning objectives were met by the patient during this visit:   Identifies Phase 3 (Soft, High Protein Foods) Dietary Goals and will begin from 2 weeks post-operatively to 2 months post-operatively  Identifies appropriate sources of fluids and proteins   States protein recommendations and appropriate sources post-operatively  Identifies the need for appropriate texture modifications, mastication, and bite sizes when consuming solids  Identifies appropriate multivitamin and calcium sources post-operatively  Describes the  need for physical activity post-operatively and will follow MD recommendations  States when to call healthcare provider regarding medication questions or post-operative complications  Handouts given include:  Phase 3: Soft, High Protein Diet  Phase 3 High Protein Meal Ideas  Follow-Up Plan: Patient will follow-up at [facility] in 6 weeks for 2 month post-op nutrition visit for diet advancement per MD.  

## 2018-08-13 NOTE — Patient Outreach (Signed)
[facility]) Care Management  08/13/2018  Rebecca Russo Jun 09, 1975 [id number]    Case will be closed with no response from pt via outreach letter or telephonically.   Elliot Cousin, RN Care Management Coordinator [facility] Main Office [phone]

## 2018-09-09 IMAGING — RF DG UGI W/ KUB
10 of 13 series · 12 of 24 positions shown · non-contrast
Comparison: None.

CLINICAL DATA: Pre bariatric workup, morbid obesity.

EXAM:
UPPER GI SERIES WITH KUB
TECHNIQUE: After obtaining a scout radiograph a routine upper GI series was
performed using thin barium
FLUOROSCOPY TIME:  Fluoroscopy Time:  2 minutes, 31 seconds
Radiation Exposure Index (if provided by the fluoroscopic device):
90.2 mGy
Number of Acquired Spot Images: 5

[Series 3: cp_standard · 0.34mm/px · 1 of 37 frames shown (1 of 9)]
[frame 6/37]
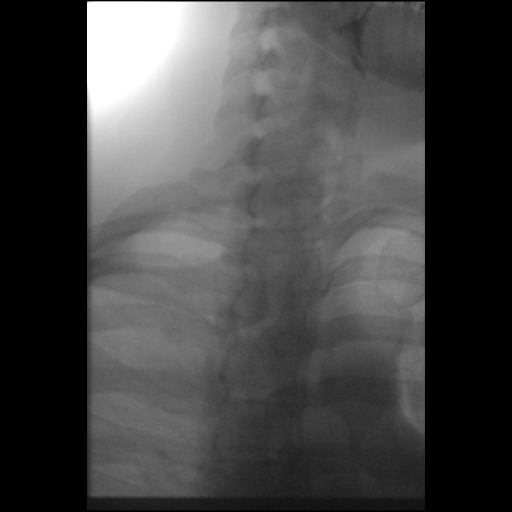

[Series 4: cp_standard · 0.35mm/px · 1 of 36 frames shown (2 of 9)]
[frame 6/36]
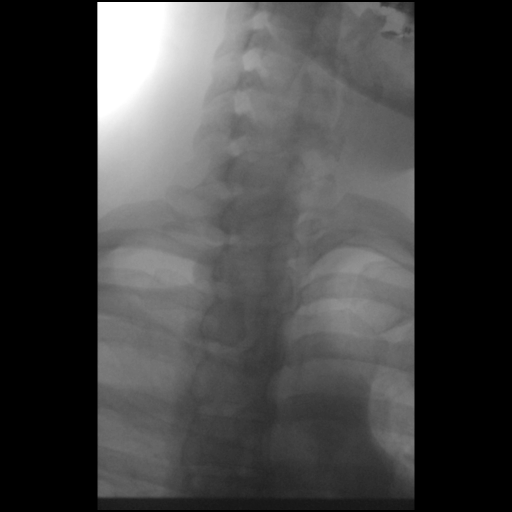

[Series 5: cp_standard · 0.35mm/px · 2 of 36 frames shown (3 of 9)]
[frame 6/36]
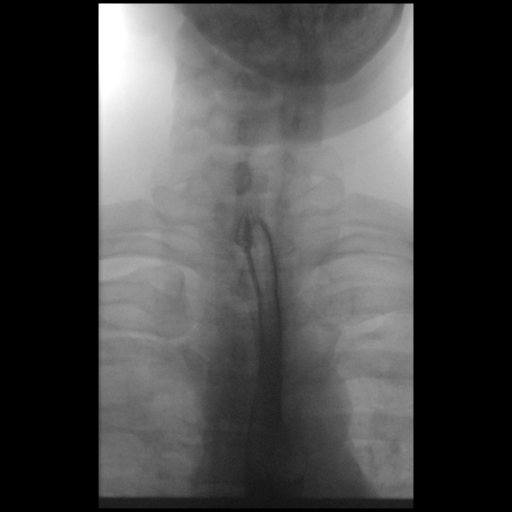
[frame 34/36]
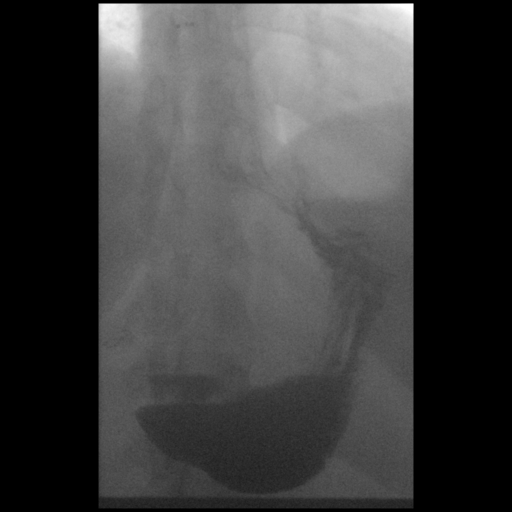

[Series 6: cp_standard · 0.35mm/px · 1 of 47 frames shown (4 of 9)]
[frame 40/47]
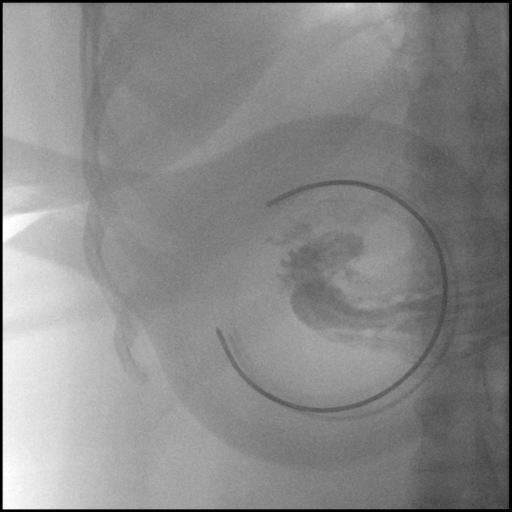

[Series 10: fluoro_barium 2fps_bw · 0.17mm/px · 1 of 1 slices shown]
[im 1/1]
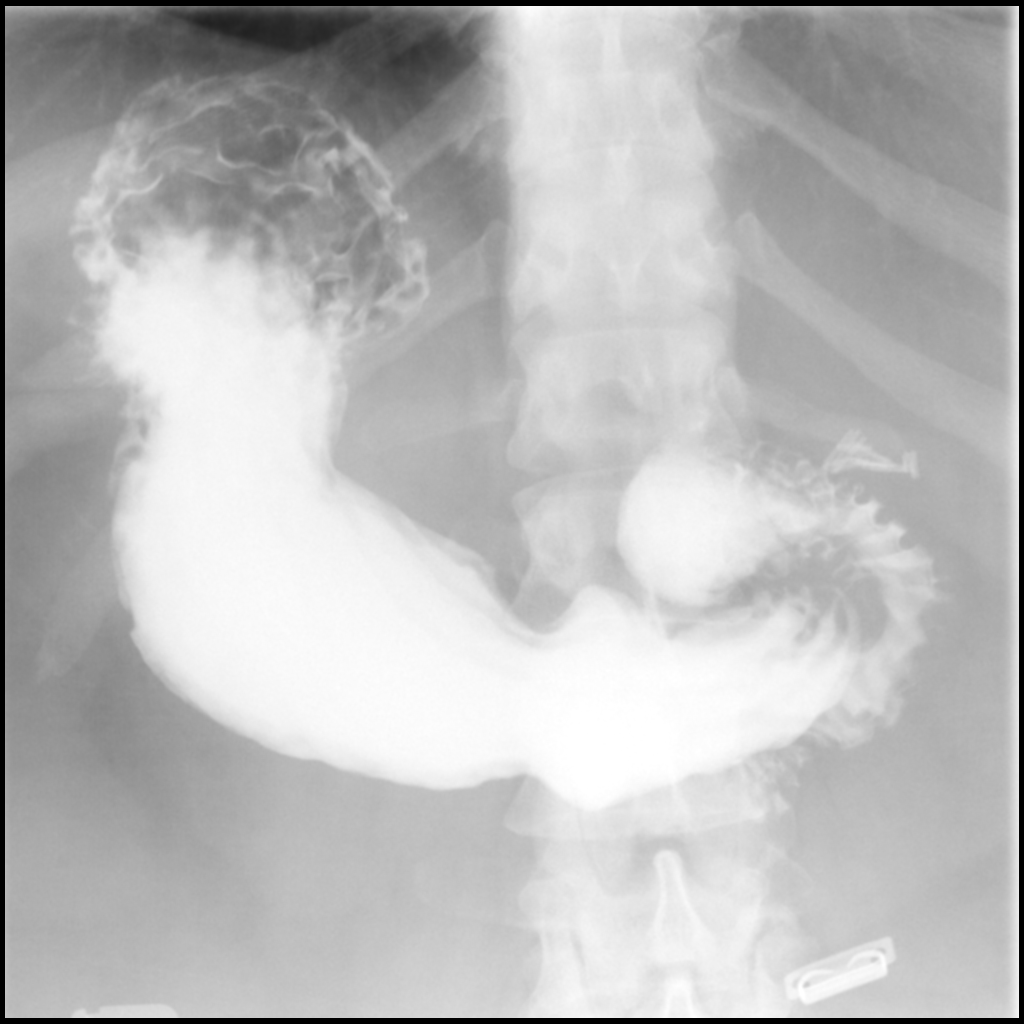

[Series 12: cp_standard · 0.34mm/px · 1 of 34 frames shown (5 of 9)]
[frame 29/34]
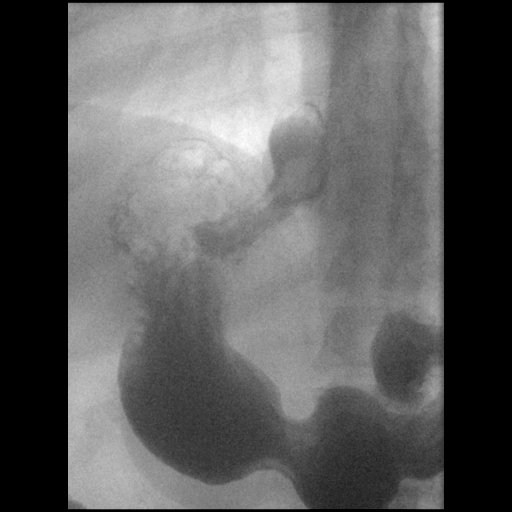

[Series 13: cp_standard · 0.35mm/px · 1 of 37 frames shown (6 of 9)]
[frame 32/37]
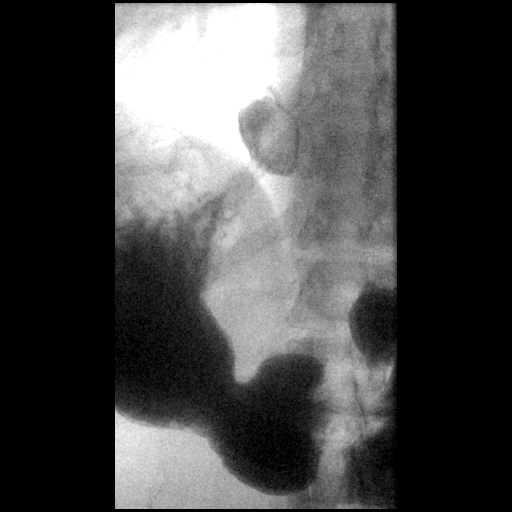

[Series 15: cp_standard · 0.35mm/px · 2 of 27 frames shown (7 of 9)]
[frame 3/27]
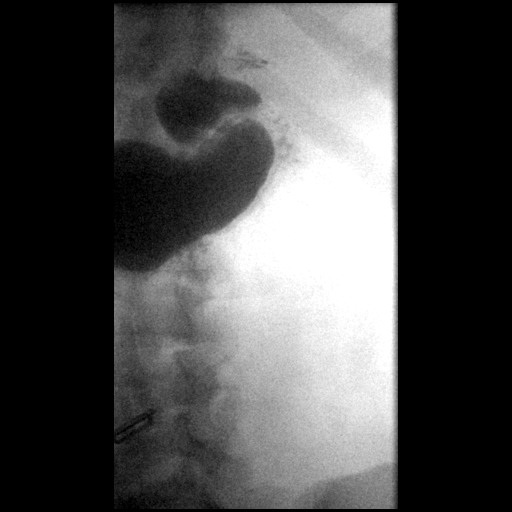
[frame 23/27]
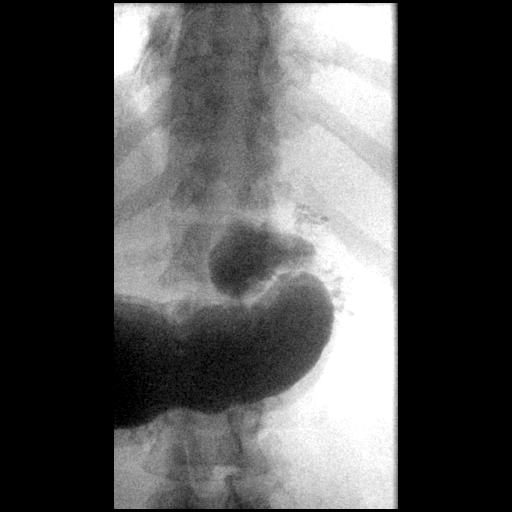

[Series 16: cp_standard · 0.34mm/px · 1 of 19 frames shown (8 of 9)]
[frame 17/19]
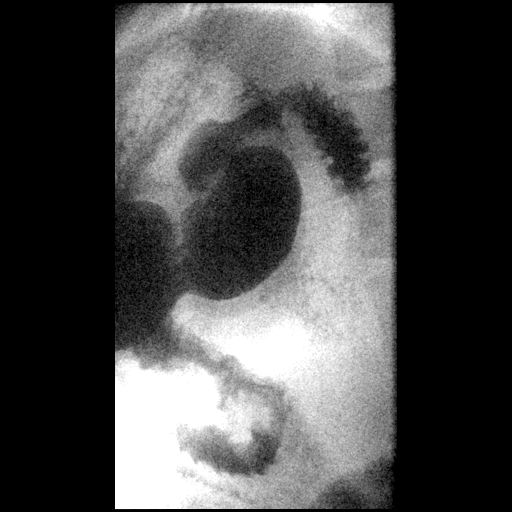

[Series 17: cp_standard · 0.34mm/px · 1 of 20 frames shown (9 of 9)]
[frame 18/20]
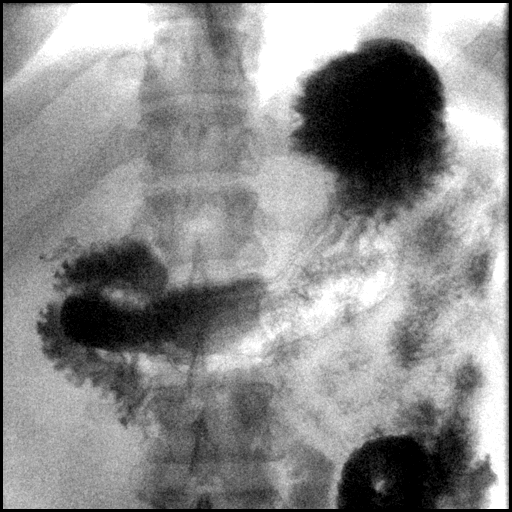

[12 of 24 positions shown; findings below may reference images not displayed]

FINDINGS: Initial KUB demonstrates cholecystectomy clips in the right upper
quadrant a relatively gasless abdomen. No significant abnormal
calcifications observed.

Primary peristaltic waves in the esophagus were normal. No
esophageal wall irregularity or stricture.

Normal gastric morphology. Compression fluoroscopy of the mucosal
relief of the stomach was normal.

There is an episode of mild gastroesophageal reflux during the exam.

The duodenal bulb and duodenal morphology appear normal.
IMPRESSION: 1. Single episode of mild gastroesophageal reflux during the course
of the exam. Otherwise unremarkable.

## 2018-09-25 ENCOUNTER — Encounter: Payer: 59 | Attending: General Surgery | Admitting: Skilled Nursing Facility1

## 2018-09-25 ENCOUNTER — Encounter: Payer: Self-pay | Admitting: Skilled Nursing Facility1

## 2018-09-25 ENCOUNTER — Other Ambulatory Visit: Payer: Self-pay

## 2018-09-25 DIAGNOSIS — E669 Obesity, unspecified: Secondary | ICD-10-CM | POA: Diagnosis not present

## 2018-09-25 NOTE — Progress Notes (Signed)
Bariatric Follow-Up Start time: 11:15am End Time: 11:45am  Post-Operative Nutrition Visit  Surgery date: 07/29/2018 Surgery type: Sleeve Gastrectomy Start weight at [facility]: 355.2 lbs (02/06/2018) Weight today: 332 lbs   Weight change: -16.1 lbs (since previous visit 09/25/2018)  Supplements: inappropriate, calcium    Pt states [place] burger and hamburger will not go down and salmon. Pt has been inadvertanlty drinking suagry beverages: pt was educated on stopping these drinks. Pt states when she is not hungry she does not eat: Dietitian educated the pt on still needing to eat to meet her needs even though she may not be feeling hunger at this time. Pt really wanted to add in vegetables. Pt states she was not tolerating her multivitamin so she is taking a chewy form the store.   Body Composition Scale Date 09/25/2018  Total Body Fat % 49.3 48  Visceral Fat 19 17  Fat-Free Mass % 50.6 51.9   Total Body Water % 39.8 40.4   Muscle-Mass lbs 37.4 37.9  Body Fat Displacement           Torso  lbs  99.2         Left Leg  lbs  19.8         Right Leg  lbs  19.8         Left Arm  lbs  9.9         Right Arm   lbs  9.9    24 recall: First meal: premier protein with coffee (sipping on it for a couple hours)  Second meal: 4 ounces salmon  Third meal: soup: broth-chicken noodle without the noodle   Beverages: apple juice, body armour, water   Estimated Daily Fluid Intake: 50-60 oz Estimated Daily Protein Intake: 50+ g  Physical Activity  Current average weekly physical activity: walking 2-3 miles 5 days a Week   Post-Op Goals Changes in vision: no Changes to mood/headaches: no Hair loss/changes to skin/nails: no Difficulty focusing/concentrating: no Sweating: no Dizziness/lightheadedness: no Palpitations: no  Carbonated/caffeinated beverages: no N/V/D/C/Gas: no Abdominal pain: no   Intervention:  Do not add in vegetables until you have reached your protein and fluid  goals  Dietitian will call you next week to check in on your progression   Pt was educated on the addition of non starchy vegetables for when she is ready Goals: -Avoid juice and body armour -Track your bottles of water: Aiming for 70 ounces of fluid -Add In 2 days a week of resistance -Eat non-starchy vegetables 2 times a day 7 days a week -Start out with soft cooked vegetables today and tomorrow; if tolerated begin to eat raw vegetables or cooked including salads -Eat your 3 ounces of protein first then start in on your non-starchy vegetables; once you understand how much of your meal leads to satisfaction and not full while still eating 3 ounces of protein and non-starchy vegetables you can eat them in any order  -Continue to aim for 30 minutes of activity at least 5 times a week -Do NOT cook with/add to your food: alfredo sauce, cheese sauce, barbeque sauce, ketchup, fat back, butter, bacon grease, grease, Crisco  -Switch to bari multi capsule: have food on your stomach first and take it later in the day  Handouts given include:  Non starchy vegetables + protein   Follow-Up Plan: Patient will follow-up at [facility] in 6 weeks for 2 month post-op nutrition visit for diet advancement per MD.

## 2018-09-25 NOTE — Patient Instructions (Addendum)
-  Avoid juice and body armour  -Track your bottles of water: Aiming for 70 ounces of fluid  -Add In 2 days a week of resistance   -Eat non-starchy vegetables 2 times a day 7 days a week  -Start out with soft cooked vegetables today and tomorrow; if tolerated begin to eat raw vegetables or cooked including salads  -Eat your 3 ounces of protein first then start in on your non-starchy vegetables; once you understand how much of your meal leads to satisfaction and not full while still eating 3 ounces of protein and non-starchy vegetables you can eat them in any order   -Continue to aim for 30 minutes of activity at least 5 times a week  -Do NOT cook with/add to your food: alfredo sauce, cheese sauce, barbeque sauce, ketchup, fat back, butter, bacon grease, grease, Crisco   -Switch to bari multi capsule: have food on your stomach first and take it later in the day

## 2018-11-13 NOTE — Progress Notes (Signed)
Medimpact fax was rcvd 07/24/18, was never documented. Aimovig approved 07/17/18 - 01/16/19 for 6 fills.  Renewal requires that Pt has reduction in frequency of at least 2 days per month OR a reduction in severity OR duration with therapy.

## 2018-11-20 ENCOUNTER — Other Ambulatory Visit: Payer: Self-pay

## 2018-11-20 ENCOUNTER — Encounter: Payer: 59 | Attending: General Surgery | Admitting: Skilled Nursing Facility1

## 2018-11-20 DIAGNOSIS — E669 Obesity, unspecified: Secondary | ICD-10-CM | POA: Diagnosis not present

## 2018-11-20 NOTE — Patient Instructions (Addendum)
-  Work on becoming slightly out of breath for your walks  -Continue to aim for 70 ounces of fluid  -Continue to aim for eating non starchy vegetables 2 times a day 7 days a week  -Try the soy options   -Add a piece of fruit to your breakfast

## 2018-11-20 NOTE — Progress Notes (Signed)
Bariatric Follow-Up Start time: 11:15am End Time: 11:45am  Post-Operative Nutrition Visit  Surgery date: 07/29/2018 Surgery type: Sleeve Gastrectomy Start weight at [facility]: 355.2 lbs (02/06/2018) Weight today: 316.2 lbs   Weight change: -15.8 lbs (since previous visit 09/25/2018)  Supplements: celebrate capsule, calcium   Pt states her son is currently vegetarian so she will try some different protein options. Pt state she does no tolerate meat so she stays away from it.    Body Composition Scale Date 09/25/2018 11/20/2018  Total Body Fat % 49.3 48 47.3  Visceral Fat 19 17 17   Fat-Free Mass % 50.6 51.9 52.6   Total Body Water % 39.8 40.4 40.8   Muscle-Mass lbs 37.4 37.9 37.1  Body Fat Displacement            Torso  lbs  99.2 92.9         Left Leg  lbs  19.8 18.5         Right Leg  lbs  19.8 18.5         Left Arm  lbs  9.9 9.2         Right Arm   lbs  9.9 9.2    24 recall: First meal: premier protein with coffee (sipping on it for a couple hours)  Second meal: 4 ounces salmon and broccoli Snack: cucumber Third meal: fish or shrimp and mixed vegetables   Beverages: water, alkaline body armour water  Estimated Daily Fluid Intake: 70 oz Estimated Daily Protein Intake: 50+ g  Physical Activity  Current average weekly physical activity: walking 15 minutes using 10 pound weights and 2-3 miles walking 5 days a week  Post-Op Goals Changes in vision: no Changes to mood/headaches: no Hair loss/changes to skin/nails: no Difficulty focusing/concentrating: no Sweating: no Dizziness/lightheadedness: no Palpitations: no  Carbonated/caffeinated beverages: no N/V/D/C/Gas: no Abdominal pain: no  Goals: -Work on becoming slightly out of breath for your walks -Continue to aim for 70 ounces of fluid -Continue to aim for eating non starchy vegetables 2 times a day 7 days a week -Try the soy options -Add a piece of fruit to your breakfast     Follow-Up Plan: Patient will  follow-up at [facility] in 6 weeks in 3 months

## 2018-11-27 ENCOUNTER — Ambulatory Visit: Payer: Self-pay | Admitting: Neurology

## 2018-12-16 DIAGNOSIS — L700 Acne vulgaris: Secondary | ICD-10-CM | POA: Diagnosis not present

## 2018-12-16 DIAGNOSIS — L680 Hirsutism: Secondary | ICD-10-CM | POA: Diagnosis not present

## 2018-12-16 MED FILL — VANIQA 13.9% CREAM: 13.9 | 30 days supply | Qty: 45 | Fill #0

## 2018-12-16 MED FILL — CLIND PH-BENZOYL PEROX 1.2-: 1.2-5 | 30 days supply | Qty: 45 | Fill #0

## 2018-12-24 MED FILL — AIMOVIG 70 MG/ML SOAJ: 70 | 30 days supply | Qty: 1 | Fill #1

## 2019-01-08 ENCOUNTER — Telehealth: Payer: Self-pay | Admitting: *Deleted

## 2019-01-08 NOTE — Telephone Encounter (Signed)
CAlled to ask if HA inproved, etc to fill out the renewal form on covermymeds. Rebecca Russo (Key: [id number]) Aimovig 70MG /ML auto-injectors   Form Med[employer]act ePA Form Created 3 days ago Sent to Plan 5 minutes ago Plan Response 5 minutes ago Submit Clinical Questions Expires 10 days from now Determination Clinical Questions Are Ready Fill out the questions below and click "Send to Plan." The plan requires answers to the clinical questions for this electronic prior authorization.

## 2019-01-10 ENCOUNTER — Encounter: Payer: Self-pay | Admitting: *Deleted

## 2019-01-10 NOTE — Progress Notes (Addendum)
Norleen Silbernagel (Key: [id number]) Aimovig 70MG /ML auto-injectors   Form MedImpact ePA Form Created 8 days ago Sent to Plan 5 days ago Plan Response 5 days ago Submit Clinical Questions 3 days ago Determination Favorable 1 day ago Message from Plan The request has been approved. The authorization is effective for a maximum of 12 fills from 01/17/2019 to 01/16/2020, as long as the member is enrolled in their current health plan. The request was approved as submitted. This request is approved for 38mL ( 70mg /mL autoinjector) per 30 days. A written notification letter will follow with additional details.

## 2019-01-10 NOTE — Telephone Encounter (Signed)
Called patient to answer 3 questions for prior authorization to continue Aimovig.  She said the duration had decreased, the frequency has been reduced by more than 2days/ month and the severity has improved. So I will continue to submit the PA.

## 2019-02-18 ENCOUNTER — Ambulatory Visit: Payer: 59 | Admitting: Skilled Nursing Facility1

## 2019-03-21 ENCOUNTER — Other Ambulatory Visit: Payer: Self-pay

## 2019-03-21 ENCOUNTER — Ambulatory Visit (INDEPENDENT_AMBULATORY_CARE_PROVIDER_SITE_OTHER): Payer: 59 | Admitting: Cardiovascular Disease

## 2019-03-21 ENCOUNTER — Encounter: Payer: Self-pay | Admitting: Cardiovascular Disease

## 2019-03-21 VITALS — BP 118/68 | HR 70 | Ht 70.5 in | Wt 296.2 lb

## 2019-03-21 DIAGNOSIS — I428 Other cardiomyopathies: Secondary | ICD-10-CM

## 2019-03-21 DIAGNOSIS — M7989 Other specified soft tissue disorders: Secondary | ICD-10-CM | POA: Diagnosis not present

## 2019-03-21 DIAGNOSIS — Q2112 Patent foramen ovale: Secondary | ICD-10-CM

## 2019-03-21 DIAGNOSIS — M79604 Pain in right leg: Secondary | ICD-10-CM

## 2019-03-21 DIAGNOSIS — Q211 Atrial septal defect: Secondary | ICD-10-CM

## 2019-03-21 NOTE — Progress Notes (Signed)
Cardiology Office Note:    Date:  03/21/2019   ID:  Rebecca Russo, DOB [birth year], MRN [id number]  PCP:  Inda Coke, [place]  Cardiologist:  Sherren Mocha, MD  Electrophysiologist:  None   Referring MD: Inda Coke, PA   Chief Complaint  Patient presents with  . Leg Pain    History of Present Illness:    Rebecca Russo is a 43 y.o. female with a hx of PFO and cryptogenic stroke, presenting for follow-up evaluation.  The patient initially presented in 2016 with a left MCA infarct treated with mechanical thrombectomy.  She was found to have a large PFO with atrial septal aneurysm and she underwent transcatheter closure with a 25 mm cribriform device.  At follow-up she was incidentally noted to have mild to moderate global LV systolic dysfunction with LVEF 40 to 45%.  She was not able to tolerate medical therapy because of symptomatic hypotension, even with low doses of losartan and carvedilol.  An echocardiogram performed in 2019 showed improvement of LV function into the low normal range with LVEF 50 to 55%.  The patient is doing well.  She denies any symptoms of chest pain or shortness of breath.  She has no orthopnea, PND, or heart palpitations.  She complains of pain in her right calf and some swelling in her right ankle that is also painful.  There is no typical symptom of calf claudication.  Past Medical History:  Diagnosis Date  . Aphasia complicating stroke   . Bilateral swelling of feet   . Chronic systolic heart failure ([place]) 06/09/2016   a - Prob nonischemic //  Echo 10/17: EF 40-45, diffuse HK, mild LAE, PFO closure device without residual shunt  //  Could not tol cMRI // Echo 4/18: EF 55, normal wall motion, normal diastolic function, trivial MR, PFO device in place without residual shunt, PASP 24  . Constipation   . Eczema   . H/O varicella   . Hemiplegia affecting dominant side ([place])   . History of ischemic left MCA stroke    weakness on right side  . Increased  BMI 12/17/08  . LAE (left atrial enlargement) 04/05/2018   Mod, noted on ECHO  . Migraines   . Morbid obesity ([place])   . PFO with atrial septal aneurysm 06/04/2014   a - s/p transcatheter closure in 2/16 with 25 mm Amplatzer Cribiform ASO device // b - Echo 10/17:  EF 40-45, diffuse HK, mild LAE, PFO closure device without residual shunt   . Stroke (cerebrum) ([place])   . Vitamin D deficiency     Past Surgical History:  Procedure Laterality Date  . CESAREAN SECTION  1999  . COLON SURGERY  2004   partial colectomy  . LAPAROSCOPIC CHOLECYSTECTOMY  06/20/2012  . LAPAROSCOPIC GASTRIC SLEEVE RESECTION N/A 07/29/2018   Procedure: LAPAROSCOPIC GASTRIC SLEEVE RESECTION, UPPER ENDO, ERAS Pathway;  Surgeon: Excell Seltzer, MD;  Location: [facility];  Service: General;  Laterality: N/A;  . PATENT FORAMEN OVALE CLOSURE  07/09/2014  . PATENT FORAMEN OVALE CLOSURE N/A 07/09/2014   Procedure: PATENT FORAMEN OVALE CLOSURE;  Surgeon: Blane Ohara, MD;  Location: [facility] CATH LAB;  Service: Cardiovascular;  Laterality: N/A;  . RADIOLOGY WITH ANESTHESIA N/A 05/29/2014   Procedure: RADIOLOGY WITH ANESTHESIA;  Surgeon: Rob Hickman, MD;  Location: [place];  Service: Radiology;  Laterality: N/A;  . TEE WITHOUT CARDIOVERSION N/A 06/03/2014   Procedure: TRANSESOPHAGEAL ECHOCARDIOGRAM (TEE);  Surgeon: Josue Hector, MD;  Location: [facility]  ENDOSCOPY;  Service: Cardiovascular;  Laterality: N/A;    Current Medications: Current Meds  Medication Sig  . citalopram (CELEXA) 20 MG tablet Take 1 tablet (20 mg total) by mouth daily.  . Clindamycin-Benzoyl Per, Refr, gel   . clopidogrel (PLAVIX) 75 MG tablet Take 1 tablet (75 mg total) by mouth daily. Do not start until March 18  . Erenumab-aooe (AIMOVIG) 70 MG/ML SOAJ Inject 70 mg into the skin every 30 (thirty) days.  Marland Kitchen levonorgestrel (MIRENA) 20 MCG/24HR IUD 1 each by Intrauterine route once. IUD place by GYN 05/2018, needs to be removed in 5 years 05/2023.  . rosuvastatin  (CRESTOR) 10 MG tablet Take 1 tablet (10 mg total) by mouth daily.  Marland Kitchen VANIQA 13.9 % cream      Allergies:   Patient has no known allergies.   Social History   Socioeconomic History  . Marital status: Single    Spouse name: Not on file  . Number of children: 2  . Years of education: Associates  . Highest education level: Associate degree: academic program  Occupational History  . Occupation: Radiology Scheduler    Comment: [employer]  Social Needs  . Financial resource strain: Not on file  . Food insecurity    Worry: Not on file    Inability: Not on file  . Transportation needs    Medical: Not on file    Non-medical: Not on file  Tobacco Use  . Smoking status: Never Smoker  . Smokeless tobacco: Never Used  Substance and Sexual Activity  . Alcohol use: No    Alcohol/week: 0.0 standard drinks  . Drug use: No  . Sexual activity: Not Currently    Birth control/protection: Abstinence, Pill    Comment: [name]  Lifestyle  . Physical activity    Days per week: Not on file    Minutes per session: Not on file  . Stress: Not on file  Relationships  . Social Musician on phone: Not on file    Gets together: Not on file    Attends religious service: Not on file    Active member of club or organization: Not on file    Attends meetings of clubs or organizations: Not on file    Relationship status: Not on file  Other Topics Concern  . Not on file  Social History Narrative   Patient is single with 2 children --> 25 and 20, both in college. Daughter is at [employer] studying nursing. Son at [employer].   Patient is right handed.   Patient has her Associates degree.   Patient drinks 1 soda a month.   Worked in radiology scheduling     Family History: The patient's family history includes Cancer in an other family member; Diabetes in her father and mother; Heart disease in her father and mother; Hypertension in her father and mother; Sleep apnea in her mother; Stroke in her  father, maternal grandfather, and paternal grandfather. There is no history of Breast cancer or Colon cancer.  ROS:   Please see the history of present illness.    All other systems reviewed and are negative.  EKGs/Labs/Other Studies Reviewed:    The following studies were reviewed today: Echo 04-05-2018: Study Conclusions  - Left ventricle: The cavity size was normal. Wall thickness was   normal. Systolic function was normal. The estimated ejection   fraction was in the range of 50% to 55%. Wall motion was normal;   there were no regional wall motion  abnormalities. Left   ventricular diastolic function parameters were normal. - Left atrium: The atrium was moderately dilated. - Atrial septum: There was an atrial septal aneurysm.  Impressions:  - Low normal LV systolic LV function; moderate LAE; atrial septal   aneurysm; PFO closure device not well visualized; no obvious   residual PFO; GLS -17.9%.   EKG:  EKG is  ordered today.  The ekg ordered today demonstrates normal sinus rhythm 62 bpm, nonspecific T wave abnormality.  Recent Labs: 04/03/2018: NT-Pro BNP 7 07/23/2018: ALT 15; BUN 9; Creatinine, Ser 0.87; Potassium 4.0; Sodium 138 07/30/2018: Hemoglobin 10.4; Platelets 381  Recent Lipid Panel    Component Value Date/Time   CHOL 170 06/13/2018 0835   CHOL 152 08/29/2017 1500   TRIG 175.0 (H) 06/13/2018 0835   HDL 33.90 (L) 06/13/2018 0835   HDL 48 08/29/2017 1500   CHOLHDL 5 06/13/2018 0835   VLDL 35.0 06/13/2018 0835   LDLCALC 101 (H) 06/13/2018 0835   LDLCALC 79 08/29/2017 1500    Physical Exam:    VS:  BP 118/68   Pulse 70   Ht 5' 10.5" (1.791 m)   Wt 296 lb 3.2 oz (134.4 kg)   SpO2 99%   BMI 41.90 kg/m     Wt Readings from Last 3 Encounters:  03/21/19 296 lb 3.2 oz (134.4 kg)  11/20/18 (!) 316 lb 3.2 oz (143.4 kg)  09/25/18 (!) 332 lb 8 oz (150.8 kg)     GEN:  Well nourished, well developed pleasant obese woman in no acute distress HEENT: Normal  NECK: No JVD; No carotid bruits LYMPHATICS: No lymphadenopathy CARDIAC: RRR, no murmurs, rubs, gallops RESPIRATORY:  Clear to auscultation without rales, wheezing or rhonchi  ABDOMEN: Soft, non-tender, non-distended MUSCULOSKELETAL:  1+ right ankle edema, mild right calf tenderness with no palpable cord; No deformity  SKIN: Warm and dry NEUROLOGIC:  Alert and oriented x 3 PSYCHIATRIC:  Normal affect   ASSESSMENT:    1. Leg swelling   2. Pain of right lower extremity   3. PFO (patent foramen ovale)   4. Morbid obesity (HCC)   5. Non-ischemic cardiomyopathy (HCC)    PLAN:    In order of problems listed above:  1. Mild right ankle swelling noted today.  We will check a venous duplex.  Also noted to have some tenderness in the right calf area. 2. As above 3. Status post transcatheter closure 4. The patient has undergone weight loss surgery earlier this year.  She has lost about 70 pounds.  She is encouraged about her progress and she has a goal weight of about 240 pounds. 5. LV function was improved at the time of her last echo.  She currently is asymptomatic.  Continue observation.   Medication Adjustments/Labs and Tests Ordered: Current medicines are reviewed at length with the patient today.  Concerns regarding medicines are outlined above.  Orders Placed This Encounter  Procedures  . EKG 12-Lead  . LE VENOUS   No orders of the defined types were placed in this encounter.   Patient Instructions  Medication Instructions:  Your provider recommends that you continue on your current medications as directed. Please refer to the Current Medication list given to you today.   *If you need a refill on your cardiac medications before your next appointment, please call your pharmacy*  Testing/Procedures: Your physician has requested that you have a lower extremity venous duplex. This test is an ultrasound of the veins in the legs. It looks  at venous blood flow that carries blood  from the heart to the legs. Allow one hour for a Lower Venous exam. There are no restrictions or special instructions.  Follow-Up: At [facility], you and your health needs are our priority.  As part of our continuing mission to provide you with exceptional heart care, we have created designated Provider Care Teams.  These Care Teams include your primary Cardiologist (physician) and Advanced Practice Providers (APPs -  Physician Assistants and Nurse Practitioners) who all work together to provide you with the care you need, when you need it. Your next appointment:   12 months The format for your next appointment:   In Person Provider:   You may see Tonny Bollman, MD or one of the following Advanced Practice Providers on your designated Care Team:    Tereso Newcomer, PA-C  Vin Enon, PA-C  Berton Bon, [place]     Signed, Tonny Bollman, MD  03/21/2019 5:32 PM    [facility]

## 2019-03-21 NOTE — Patient Instructions (Signed)
Medication Instructions:  Your provider recommends that you continue on your current medications as directed. Please refer to the Current Medication list given to you today.   *If you need a refill on your cardiac medications before your next appointment, please call your pharmacy*  Testing/Procedures: Your physician has requested that you have a lower extremity venous duplex. This test is an ultrasound of the veins in the legs. It looks at venous blood flow that carries blood from the heart to the legs. Allow one hour for a Lower Venous exam. There are no restrictions or special instructions.  Follow-Up: At [facility], you and your health needs are our priority.  As part of our continuing mission to provide you with exceptional heart care, we have created designated Provider Care Teams.  These Care Teams include your primary Cardiologist (physician) and Advanced Practice Providers (APPs -  Physician Assistants and Nurse Practitioners) who all work together to provide you with the care you need, when you need it. Your next appointment:   12 months The format for your next appointment:   In Person Provider:   You may see Sherren Mocha, MD or one of the following Advanced Practice Providers on your designated Care Team:    Richardson Dopp, PA-C  Vin Pocahontas, [place]  Daune Perch, [place]

## 2019-03-24 ENCOUNTER — Other Ambulatory Visit: Payer: Self-pay

## 2019-03-24 ENCOUNTER — Ambulatory Visit (HOSPITAL_COMMUNITY)
Admission: RE | Admit: 2019-03-24 | Discharge: 2019-03-24 | Disposition: A | Discharge status: Home / Self Care | Payer: 59 | Source: Ambulatory Visit | Attending: Cardiology | Admitting: Cardiology

## 2019-03-24 DIAGNOSIS — M79604 Pain in right leg: Secondary | ICD-10-CM | POA: Insufficient documentation

## 2019-03-24 DIAGNOSIS — M7989 Other specified soft tissue disorders: Secondary | ICD-10-CM | POA: Diagnosis not present

## 2019-04-02 MED FILL — AIMOVIG 70 MG/ML SOAJ: 70 | 30 days supply | Qty: 1 | Fill #2

## 2019-04-02 MED FILL — CLIND PH-BENZOYL PEROX 1.2-: 1.2-5 | 30 days supply | Qty: 45 | Fill #1

## 2019-04-02 MED FILL — VANIQA 13.9% CREAM: 13.9 | 30 days supply | Qty: 45 | Fill #1

## 2019-04-07 ENCOUNTER — Ambulatory Visit: Payer: 59 | Admitting: Physician Assistant

## 2019-06-16 ENCOUNTER — Other Ambulatory Visit: Payer: Self-pay | Admitting: Physician Assistant

## 2019-06-16 DIAGNOSIS — Z1231 Encounter for screening mammogram for malignant neoplasm of breast: Secondary | ICD-10-CM

## 2019-06-17 ENCOUNTER — Telehealth: Payer: Self-pay | Admitting: Obstetrics and Gynecology

## 2019-06-17 DIAGNOSIS — Z30432 Encounter for removal of intrauterine contraceptive device: Secondary | ICD-10-CM

## 2019-06-17 NOTE — Telephone Encounter (Signed)
Spoke with patient. Patient is requesting to schedule Mirena IUD removal. IUD was placed 06/05/18. Patient reports she does not feel "right" with IUD.  Has a monthly menses and BTB between menses. Did not experience this with previous Mirena IUD. Denies pelvic pain, vaginal odor, abnormal d/c, urinary symptoms. IUD removal scheduled for 06/23/19 at 10:30am with Dr. Edward Jolly. Patient will discuss with Dr. Edward Jolly prior to removal.   Order placed for precert.   Last AEX 05/24/18 Next AEX 07/01/19  Routing to provider for final review. Patient is agreeable to disposition. Will close encounter.  Cc: Soundra Pilon, [address] [place]

## 2019-06-17 NOTE — Telephone Encounter (Signed)
Patient would like to schedule iud removal.

## 2019-06-18 ENCOUNTER — Telehealth: Payer: Self-pay | Admitting: Obstetrics and Gynecology

## 2019-06-18 NOTE — Telephone Encounter (Signed)
Call placed to convey benefits for iud removal. Spoke with the patient and conveyed the benefits. Patient understands/agreeable with the benefits. Patient is aware of the cancellation policy. Appointment scheduled 06/23/19.

## 2019-06-20 ENCOUNTER — Other Ambulatory Visit: Payer: Self-pay

## 2019-06-23 ENCOUNTER — Encounter: Payer: Self-pay | Admitting: Obstetrics and Gynecology

## 2019-06-23 ENCOUNTER — Ambulatory Visit (INDEPENDENT_AMBULATORY_CARE_PROVIDER_SITE_OTHER): Payer: 59 | Admitting: Obstetrics and Gynecology

## 2019-06-23 ENCOUNTER — Other Ambulatory Visit: Payer: Self-pay

## 2019-06-23 VITALS — BP 110/76 | HR 66 | Temp 97.2°F | Ht 70.0 in | Wt 272.0 lb

## 2019-06-23 DIAGNOSIS — N926 Irregular menstruation, unspecified: Secondary | ICD-10-CM | POA: Diagnosis not present

## 2019-06-23 DIAGNOSIS — Z3009 Encounter for other general counseling and advice on contraception: Secondary | ICD-10-CM | POA: Diagnosis not present

## 2019-06-23 DIAGNOSIS — Z30432 Encounter for removal of intrauterine contraceptive device: Secondary | ICD-10-CM

## 2019-06-23 MED ORDER — NORETHINDRONE 0.35 MG PO TABS: 1.0000 | ORAL_TABLET | Freq: Every day | ORAL | 0 refills | Status: DC

## 2019-06-23 NOTE — Progress Notes (Signed)
GYNECOLOGY  VISIT   HPI: 44 y.o.   Single  African American  female   [phone] with Patient's last menstrual period was 06/15/2019 (approximate).   here for Mirena IUD removal and discuss alternate birth control methods.    She is having continuous bleeding.  Bleeding 5 - 6 days in a row per month.  This occurs once a month at the end of the month.  Changes a pad or tampon 10 times during the day for freshness.  Probably need to change 4 times a day.  Having a lot of cramping. She is feeling something in her lower mid abdomen even when she is not on her cycle. Not taking pain medication.   No change in sexual partner.   She wants to remove her IUD and wants to go on birth control.  She wants to stop her menses or have flow for less days.  She does have some bleeding outside of her cycle time.  This occurs for an additional 4 -5 days per month.   Really does not like her menstruation.  She did not have her cycle since age 67 yo.   She had Nexplanon and Depo Provera in the past.  She developed hair under her chin.   Has gastric sleeve in March, 2020.  Lost from 368 pounds down to 272 pounds.   She had recent swelling of her right leg and she had a negative Doppler.   GYNECOLOGIC HISTORY: Patient's last menstrual period was 06/15/2019 (approximate). Contraception:  Mirena IUD 06-05-18 Menopausal hormone therapy:  n/a Last mammogram:  06/26/18 BIRADS 1 negative/density b Last pap smear: 09-19-17 Neg:Neg HR HPV        OB History    Gravida  2   Para  2   Term  2   Preterm      AB      Living  2     SAB      TAB      Ectopic      Multiple      Live Births  2              Patient Active Problem List   Diagnosis Date Noted  . Morbid obesity with body mass index (BMI) of 50.0 to 59.9 in adult [facility]) 07/29/2018  . Left knee pain 06/20/2018  . Chronic systolic heart failure (HCC) 06/09/2016  . Depression 02/02/2015  . Obesity 06/04/2014  . Dysphagia,  pharyngoesophageal phase 06/04/2014  . PFO with atrial septal aneurysm 06/04/2014  . Hyperlipidemia LDL goal <70 06/04/2014  . Ischemic brain damage   . Acute respiratory failure with hypoxia (HCC)   . Cerebrovascular accident (CVA) due to embolism of left middle cerebral artery (HCC) 05/29/2014  . Aphasia complicating stroke 05/29/2014  . Hemiplegia affecting dominant side (HCC) 05/29/2014  . CONSTIPATION 03/09/2008  . IRRITABLE BOWEL SYNDROME 03/09/2008    Past Medical History:  Diagnosis Date  . Aphasia complicating stroke   . Bilateral swelling of feet   . Chronic systolic heart failure (HCC) 06/09/2016   a - Prob nonischemic //  Echo 10/17: EF 40-45, diffuse HK, mild LAE, PFO closure device without residual shunt  //  Could not tol cMRI // Echo 4/18: EF 55, normal wall motion, normal diastolic function, trivial MR, PFO device in place without residual shunt, PASP 24  . Constipation   . Eczema   . H/O varicella   . Hemiplegia affecting dominant side (HCC)   . History of  ischemic left MCA stroke    weakness on right side  . Increased BMI 12/17/08  . LAE (left atrial enlargement) 04/05/2018   Mod, noted on ECHO  . Migraines   . Morbid obesity (HCC)   . PFO with atrial septal aneurysm 06/04/2014   a - s/p transcatheter closure in 2/16 with 25 mm Amplatzer Cribiform ASO device // b - Echo 10/17:  EF 40-45, diffuse HK, mild LAE, PFO closure device without residual shunt   . Stroke (cerebrum) (HCC)   . Vitamin D deficiency     Past Surgical History:  Procedure Laterality Date  . CESAREAN SECTION  1999  . COLON SURGERY  2004   partial colectomy  . LAPAROSCOPIC CHOLECYSTECTOMY  06/20/2012  . LAPAROSCOPIC GASTRIC SLEEVE RESECTION N/A 07/29/2018   Procedure: LAPAROSCOPIC GASTRIC SLEEVE RESECTION, UPPER ENDO, ERAS Pathway;  Surgeon: Glenna Fellows, MD;  Location: [facility];  Service: General;  Laterality: N/A;  . PATENT FORAMEN OVALE CLOSURE  07/09/2014  . PATENT FORAMEN OVALE CLOSURE  N/A 07/09/2014   Procedure: PATENT FORAMEN OVALE CLOSURE;  Surgeon: Micheline Chapman, MD;  Location: [facility] CATH LAB;  Service: Cardiovascular;  Laterality: N/A;  . RADIOLOGY WITH ANESTHESIA N/A 05/29/2014   Procedure: RADIOLOGY WITH ANESTHESIA;  Surgeon: Oneal Grout, MD;  Location: [facility] OR;  Service: Radiology;  Laterality: N/A;  . TEE WITHOUT CARDIOVERSION N/A 06/03/2014   Procedure: TRANSESOPHAGEAL ECHOCARDIOGRAM (TEE);  Surgeon: Wendall Stade, MD;  Location: [facility] ENDOSCOPY;  Service: Cardiovascular;  Laterality: N/A;    Current Outpatient Medications  Medication Sig Dispense Refill  . citalopram (CELEXA) 20 MG tablet Take 1 tablet (20 mg total) by mouth daily. 90 tablet 1  . clopidogrel (PLAVIX) 75 MG tablet Take 1 tablet (75 mg total) by mouth daily. Do not start until March 18 30 tablet 0  . Erenumab-aooe (AIMOVIG) 70 MG/ML SOAJ Inject 70 mg into the skin every 30 (thirty) days. 1 pen 11  . levonorgestrel (MIRENA) 20 MCG/24HR IUD 1 each by Intrauterine route once. IUD place by GYN 05/2018, needs to be removed in 5 years 05/2023.    . rosuvastatin (CRESTOR) 10 MG tablet Take 1 tablet (10 mg total) by mouth daily. 90 tablet 1  . VANIQA 13.9 % cream      No current facility-administered medications for this visit.     ALLERGIES: Patient has no known allergies.  Family History  Problem Relation Age of Onset  . Hypertension Mother   . Diabetes Mother   . Heart disease Mother   . Sleep apnea Mother   . Hypertension Father   . Stroke Father   . Diabetes Father   . Heart disease Father   . Stroke Paternal Grandfather   . Stroke Maternal Grandfather   . Cancer Other   . Breast cancer Neg Hx   . Colon cancer Neg Hx     Social History   Socioeconomic History  . Marital status: Single    Spouse name: Not on file  . Number of children: 2  . Years of education: Associates  . Highest education level: Associate degree: academic program  Occupational History  . Occupation: Radiology  Scheduler    Comment: [employer]  Tobacco Use  . Smoking status: Never Smoker  . Smokeless tobacco: Never Used  Substance and Sexual Activity  . Alcohol use: No    Alcohol/week: 0.0 standard drinks  . Drug use: No  . Sexual activity: Not Currently    Birth control/protection: Abstinence, Pill  Comment: [name]  Other Topics Concern  . Not on file  Social History Narrative   Patient is single with 2 children --> 25 and 20, both in college. Daughter is at [employer] studying nursing. Son at [employer].   Patient is right handed.   Patient has her Associates degree.   Patient drinks 1 soda a month.   Worked in radiology scheduling   Social Determinants of Health   Financial Resource Strain:   . Difficulty of Paying Living Expenses: Not on file  Food Insecurity:   . Worried About Programme researcher, broadcasting/film/video in the Last Year: Not on file  . Ran Out of Food in the Last Year: Not on file  Transportation Needs:   . Lack of Transportation (Medical): Not on file  . Lack of Transportation (Non-Medical): Not on file  Physical Activity:   . Days of Exercise per Week: Not on file  . Minutes of Exercise per Session: Not on file  Stress:   . Feeling of Stress : Not on file  Social Connections:   . Frequency of Communication with Friends and Family: Not on file  . Frequency of Social Gatherings with Friends and Family: Not on file  . Attends Religious Services: Not on file  . Active Member of Clubs or Organizations: Not on file  . Attends Banker Meetings: Not on file  . Marital Status: Not on file  Intimate Partner Violence:   . Fear of Current or Ex-Partner: Not on file  . Emotionally Abused: Not on file  . Physically Abused: Not on file  . Sexually Abused: Not on file    Review of Systems  All other systems reviewed and are negative.   PHYSICAL EXAMINATION:    BP 110/76 (Cuff Size: Large)   Pulse 66   Temp (!) 97.2 F (36.2 C) (Temporal)   Ht 5\' 10"  (1.778 m)   Wt 272 lb  (123.4 kg)   LMP 06/15/2019 (Approximate)   BMI 39.03 kg/m     General appearance: alert, cooperative and appears stated age   Pelvic: External genitalia:  no lesions              Urethra:  normal appearing urethra with no masses, tenderness or lesions              Bartholins and Skenes: normal                 Vagina: normal appearing vagina with normal color and discharge, no lesions              Cervix: no lesions.  IUD strings noted.   IUD removed with ring forceps after verbal permission. IUD intact, shown to patient, and discarded.                Bimanual Exam:  Uterus:  normal size, contour, position, consistency, mobility, non-tender              Adnexa: no mass, fullness, tenderness              Chaperone was present for exam.  ASSESSMENT   Hx stroke due to PFO which was closed.  On Plavix.  Desire for Mirena IUD removal.  Resumption of menses with successful weight loss.  Irregular menses.   PLAN  We discussed resumption of menses expected with weight loss.  Will need to avoid estrogen containing contraception.  We reviewed progesterone only options for contraception - Nexplanon, Depo Provera, Micronor, and progesterone IUDs.  She opts for Micronor name brand.  3 packs given.  Instructed in use and importance of being on time with dosing.  She has an appointment for a mammogram. FU for annual exam next week.    An After Visit Summary was printed and given to the patient.  ___25___ minutes face to face time of which over 50% was spent in counseling.

## 2019-06-24 ENCOUNTER — Telehealth: Payer: Self-pay | Admitting: Obstetrics and Gynecology

## 2019-06-24 MED FILL — NORLYDA 0.35 MG TABS: 0.35 | 84 days supply | Qty: 84 | Fill #0

## 2019-06-24 NOTE — Telephone Encounter (Signed)
Per review of 06/23/19 OV notes, patient requested Brand Micronor.   Spoke with Rosann Auerbach at [facility] pharmacy. Advised ok to dispense Micronor as brand or generic, patient choice.   Call returned to patient, advised pharmacy has been notified. F/u with pharmacy for filling, return call to office if any additional questions.   Routing to provider for final review. Patient is agreeable to disposition. Will close encounter.

## 2019-06-24 NOTE — Telephone Encounter (Signed)
Patient states OCP called in yesterday is not covered by her insurance. She would like a prescription called in for the generic. Pharmacy on file.

## 2019-06-30 ENCOUNTER — Other Ambulatory Visit: Payer: Self-pay

## 2019-06-30 NOTE — Progress Notes (Signed)
44 y.o. [id number] Single African American female here for annual exam.    Mirena IUD removed on 06/23/19 and Micronor prescribed.  She started with a period the day after the IUD removal.  Using Celexa to treat anxiety, depression, stress.   PCP:  [name], PA   Patient's last menstrual period was 06/24/2019 (exact date).           Sexually active: Yes.    The current method of family planning is oral progesterone-only contraceptive.    Exercising: Yes.    Gym/ health club routine includes cardio, treadmill and eliptical. Smoker:  no  Health Maintenance: Pap: 09-19-17 Neg:Neg HR HPV History of abnormal Pap:  no MMG: 06-26-18 3D/Neg/density B/Birads1.  Has an appointment 07/23/19.  Colonoscopy: 20 years ago--due to constipation/abd.pain--had partial colectomy BMD:   n/a  Result  n/a TDaP:  2019 Gardasil:   no HIV: 05-24-18 NR Hep C: 05-24-18 Neg Screening Labs: PCP   reports that she has never smoked. She has never used smokeless tobacco. She reports that she does not drink alcohol or use drugs.  Past Medical History:  Diagnosis Date  . Aphasia complicating stroke   . Bilateral swelling of feet   . Chronic systolic heart failure (HCC) 06/09/2016   a - Prob nonischemic //  Echo 10/17: EF 40-45, diffuse HK, mild LAE, PFO closure device without residual shunt  //  Could not tol cMRI // Echo 4/18: EF 55, normal wall motion, normal diastolic function, trivial MR, PFO device in place without residual shunt, PASP 24  . Constipation   . Eczema   . H/O varicella   . Hemiplegia affecting dominant side (HCC)   . History of ischemic left MCA stroke    weakness on right side  . Increased BMI 12/17/08  . LAE (left atrial enlargement) 04/05/2018   Mod, noted on ECHO  . Migraines   . Morbid obesity (HCC)   . PFO with atrial septal aneurysm 06/04/2014   a - s/p transcatheter closure in 2/16 with 25 mm Amplatzer Cribiform ASO device // b - Echo 10/17:  EF 40-45, diffuse HK, mild LAE, PFO  closure device without residual shunt   . Stroke (cerebrum) (HCC)   . Vitamin D deficiency     Past Surgical History:  Procedure Laterality Date  . CESAREAN SECTION  1999  . COLON SURGERY  2004   partial colectomy  . LAPAROSCOPIC CHOLECYSTECTOMY  06/20/2012  . LAPAROSCOPIC GASTRIC SLEEVE RESECTION N/A 07/29/2018   Procedure: LAPAROSCOPIC GASTRIC SLEEVE RESECTION, UPPER ENDO, ERAS Pathway;  Surgeon: Glenna Fellows, MD;  Location: [facility];  Service: General;  Laterality: N/A;  . PATENT FORAMEN OVALE CLOSURE  07/09/2014  . PATENT FORAMEN OVALE CLOSURE N/A 07/09/2014   Procedure: PATENT FORAMEN OVALE CLOSURE;  Surgeon: Micheline Chapman, MD;  Location: [facility] CATH LAB;  Service: Cardiovascular;  Laterality: N/A;  . RADIOLOGY WITH ANESTHESIA N/A 05/29/2014   Procedure: RADIOLOGY WITH ANESTHESIA;  Surgeon: Oneal Grout, MD;  Location: [facility] OR;  Service: Radiology;  Laterality: N/A;  . TEE WITHOUT CARDIOVERSION N/A 06/03/2014   Procedure: TRANSESOPHAGEAL ECHOCARDIOGRAM (TEE);  Surgeon: Wendall Stade, MD;  Location: [facility] ENDOSCOPY;  Service: Cardiovascular;  Laterality: N/A;    Current Outpatient Medications  Medication Sig Dispense Refill  . citalopram (CELEXA) 20 MG tablet Take 1 tablet (20 mg total) by mouth daily. 90 tablet 1  . clopidogrel (PLAVIX) 75 MG tablet Take 1 tablet (75 mg total) by mouth daily. Do not start until  March 18 30 tablet 0  . Erenumab-aooe (AIMOVIG) 70 MG/ML SOAJ Inject 70 mg into the skin every 30 (thirty) days. 1 pen 11  . norethindrone (MICRONOR) 0.35 MG tablet Take 1 tablet (0.35 mg total) by mouth daily. 3 Package 0  . rosuvastatin (CRESTOR) 10 MG tablet Take 1 tablet (10 mg total) by mouth daily. 90 tablet 1  . VANIQA 13.9 % cream      No current facility-administered medications for this visit.    Family History  Problem Relation Age of Onset  . Hypertension Mother   . Diabetes Mother   . Heart disease Mother   . Sleep apnea Mother   . Hypertension Father    . Stroke Father   . Diabetes Father   . Heart disease Father   . Stroke Paternal Grandfather   . Stroke Maternal Grandfather   . Cancer Other   . Breast cancer Neg Hx   . Colon cancer Neg Hx     Review of Systems  All other systems reviewed and are negative.   Exam:   BP 120/76 (Cuff Size: Large)   Pulse 60   Temp (!) 96.9 F (36.1 C) (Temporal)   Resp 18   Ht 5\' 10"  (1.778 m)   Wt 271 lb 6.4 oz (123.1 kg)   LMP 06/24/2019 (Exact Date)   BMI 38.94 kg/m     General appearance: alert, cooperative and appears stated age Head: normocephalic, without obvious abnormality, atraumatic Neck: no adenopathy, supple, symmetrical, trachea midline and thyroid normal to inspection and palpation Lungs: clear to auscultation bilaterally Breasts: normal appearance, no masses or tenderness, No nipple retraction or dimpling, No nipple discharge or bleeding, No axillary adenopathy Heart: regular rate and rhythm Abdomen: soft, non-tender; no masses, no organomegaly Extremities: extremities normal, atraumatic, no cyanosis or edema Skin: skin color, texture, turgor normal. No rashes or lesions Lymph nodes: cervical, supraclavicular, and axillary nodes normal. Neurologic: grossly normal  Pelvic: External genitalia:  no lesions              No abnormal inguinal nodes palpated.              Urethra:  normal appearing urethra with no masses, tenderness or lesions              Bartholins and Skenes: normal                 Vagina: normal appearing vagina with normal color and discharge, no lesions              Cervix: no lesions              Pap taken: No. Bimanual Exam:  Uterus:  normal size, contour, position, consistency, mobility, non-tender              Adnexa: no mass, fullness, tenderness              Rectal exam: Yes.  .  Confirms.              Anus:  normal sphincter tone, no lesions  Chaperone was present for exam.  Assessment:   Well woman visit with normal exam. Status post CVA  with hemiplegia and aphasia.  Status post Foramen ovale closure. 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 Status post gastric sleeve with successful weight loss.  On Plavix.   Plan: Mammogram screening discussed. Self breast awareness reviewed. Pap and HR HPV as above. Guidelines for Calcium, Vitamin D, regular  exercise program including cardiovascular and weight bearing exercise. STD screening.  HPV vaccine - start today.   Refill on Micronor to complete one year's prescription.  She is still considering Depo Provera.  We did discuss potential weight gain with this medication.  Will send her to GI when she is 45 to discuss colon cancer screening.  Follow up annually and prn.   After visit summary provided.

## 2019-07-01 ENCOUNTER — Ambulatory Visit (INDEPENDENT_AMBULATORY_CARE_PROVIDER_SITE_OTHER): Payer: 59 | Admitting: Obstetrics and Gynecology

## 2019-07-01 ENCOUNTER — Encounter: Payer: Self-pay | Admitting: Obstetrics and Gynecology

## 2019-07-01 ENCOUNTER — Other Ambulatory Visit (HOSPITAL_COMMUNITY)
Admission: RE | Admit: 2019-07-01 | Discharge: 2019-07-01 | Disposition: A | Discharge status: Home / Self Care | Payer: 59 | Source: Ambulatory Visit | Attending: Obstetrics and Gynecology | Admitting: Obstetrics and Gynecology

## 2019-07-01 VITALS — BP 120/76 | HR 60 | Temp 96.9°F | Resp 18 | Ht 70.0 in | Wt 271.4 lb

## 2019-07-01 DIAGNOSIS — Z23 Encounter for immunization: Secondary | ICD-10-CM | POA: Diagnosis not present

## 2019-07-01 DIAGNOSIS — Z113 Encounter for screening for infections with a predominantly sexual mode of transmission: Secondary | ICD-10-CM | POA: Diagnosis not present

## 2019-07-01 DIAGNOSIS — Z01419 Encounter for gynecological examination (general) (routine) without abnormal findings: Secondary | ICD-10-CM

## 2019-07-01 MED ORDER — NORETHINDRONE 0.35 MG PO TABS: 1.0000 | ORAL_TABLET | Freq: Every day | ORAL | 2 refills | Status: DC

## 2019-07-01 NOTE — Patient Instructions (Signed)

## 2019-07-02 LAB — CERVICOVAGINAL ANCILLARY ONLY
Chlamydia: NEGATIVE
Comment: NEGATIVE
Comment: NEGATIVE
Comment: NORMAL
Neisseria Gonorrhea: NEGATIVE
Trichomonas: NEGATIVE

## 2019-07-02 LAB — HEP, RPR, HIV PANEL
HIV Screen 4th Generation wRfx: NONREACTIVE
Hepatitis B Surface Ag: NEGATIVE
RPR Ser Ql: NONREACTIVE

## 2019-07-02 LAB — HEPATITIS C ANTIBODY: Hep C Virus Ab: <0.1 s/co ratio (ref 0.0–0.9)

## 2019-07-23 ENCOUNTER — Other Ambulatory Visit: Payer: Self-pay

## 2019-07-23 ENCOUNTER — Other Ambulatory Visit: Payer: Self-pay | Admitting: Obstetrics and Gynecology

## 2019-07-23 ENCOUNTER — Ambulatory Visit
Admission: RE | Admit: 2019-07-23 | Discharge: 2019-07-23 | Disposition: A | Discharge status: Home / Self Care | Payer: 59 | Source: Ambulatory Visit | Attending: Physician Assistant | Admitting: Physician Assistant

## 2019-07-23 DIAGNOSIS — Z1231 Encounter for screening mammogram for malignant neoplasm of breast: Secondary | ICD-10-CM | POA: Diagnosis not present

## 2019-08-14 ENCOUNTER — Telehealth: Payer: Self-pay | Admitting: Obstetrics and Gynecology

## 2019-08-14 NOTE — Telephone Encounter (Signed)
Patient can start Depo Provera 150 mg IM with her next menstruation.  She can do this every 12 weeks for one year, until her annual exam is due.

## 2019-08-14 NOTE — Telephone Encounter (Signed)
Spoke back with pt. Pt given recommendations per Dr Edward Jolly as seen below. Pt agreeable and will start with next cycle. Pt states cycle due on 4/16 and has day off and has 2nd Gardasil scheduled for 4/16 at 2:45pm and would like to do same day. Pt nurse visit on 4/16 will have 2nd Gardasil and start depo on same day. Pt verbalized understanding.   Routing to Dr Edward Jolly for review and any additional recommendations.

## 2019-08-14 NOTE — Telephone Encounter (Signed)
Patient would like to change her birth control to depo provera.

## 2019-08-14 NOTE — Telephone Encounter (Signed)
Spoke to pt. Pt requesting to start Depo Provera injections instead of Micronor. Pt states not remembering to take POPs and does not want to take a pill every day. Pt states having no other problems at this tiime. Wanting to know when can start Depo. Will discuss with Dr Edward Jolly and return call to pt. Pt agreeable. Pt was last seen for AEX on 07/01/2019 with Dr Edward Jolly and per reviewed notes, Depo was mentioned as possible plan.   Routing to Dr Edward Jolly for review and recommendations.

## 2019-08-28 ENCOUNTER — Other Ambulatory Visit: Payer: Self-pay

## 2019-08-29 ENCOUNTER — Ambulatory Visit (INDEPENDENT_AMBULATORY_CARE_PROVIDER_SITE_OTHER): Payer: 59

## 2019-08-29 ENCOUNTER — Ambulatory Visit: Payer: 59

## 2019-08-29 VITALS — BP 112/70 | HR 64 | Temp 96.3°F | Resp 10 | Ht 70.0 in | Wt 269.0 lb

## 2019-08-29 DIAGNOSIS — Z3042 Encounter for surveillance of injectable contraceptive: Secondary | ICD-10-CM

## 2019-08-29 DIAGNOSIS — Z23 Encounter for immunization: Secondary | ICD-10-CM | POA: Diagnosis not present

## 2019-08-29 MED ORDER — MEDROXYPROGESTERONE ACETATE 150 MG/ML IM SUSP
150.0000 mg | Freq: Once | INTRAMUSCULAR | Status: AC
  Administered 2019-08-29: 150 mg via INTRAMUSCULAR

## 2019-08-29 NOTE — Progress Notes (Signed)
Patient in today for 2nd Gardasil injection.   Contraception: Depo-Provera LMP: 08/28/19 Last AEX: 07/01/19 with Dr. Edward Jolly  Injection given in LD. Patient tolerated shot well.   Patient informed next injection due in about 4 months.  Advised patient, if not on birth control, to return for next injection with cycle.    Patient is here for Depo Provera Injection Patient is within Depo Provera Calender Limits yes Next Depo Due between: 7/6-7/16 Last AEX: 07/01/19  AEX Scheduled: 08/04/20  Patient is aware when next depo is due  Pt tolerated Injection well in RUOQ.  Routed to provider for review, encounter closed.

## 2019-11-14 ENCOUNTER — Ambulatory Visit: Payer: 59

## 2019-11-18 ENCOUNTER — Ambulatory Visit: Payer: 59

## 2019-11-20 ENCOUNTER — Other Ambulatory Visit: Payer: Self-pay

## 2019-11-20 ENCOUNTER — Ambulatory Visit (INDEPENDENT_AMBULATORY_CARE_PROVIDER_SITE_OTHER): Payer: 59 | Admitting: *Deleted

## 2019-11-20 VITALS — BP 100/58 | HR 60 | Resp 14 | Ht 70.0 in | Wt 269.1 lb

## 2019-11-20 DIAGNOSIS — Z3042 Encounter for surveillance of injectable contraceptive: Secondary | ICD-10-CM | POA: Diagnosis not present

## 2019-11-20 MED ORDER — MEDROXYPROGESTERONE ACETATE 150 MG/ML IM SUSP
150.0000 mg | Freq: Once | INTRAMUSCULAR | Status: AC
  Administered 2019-11-20: 150 mg via INTRAMUSCULAR

## 2019-11-20 NOTE — Progress Notes (Signed)
Patient is here for Depo Provera Injection Patient is within Depo Provera Calender Limits yes, last given 08-29-19 Next Depo Due between: 9-23 to 10-7  Last AEX: 07-01-19 BS AEX Scheduled: 08-04-20  Patient is aware when next depo is due  Pt tolerated Injection well in LUOQ.   Patient states she would like to discuss alternatives to depo-provera due to acne. Patient requests to get injection today and will call to set up an appointment to discuss alternatives with Dr. Edward Jolly. Declines to schedule today.   Routed to provider for review, encounter closed.

## 2019-12-03 ENCOUNTER — Telehealth: Payer: Self-pay

## 2019-12-03 NOTE — Telephone Encounter (Signed)
Patient is calling in regards to get IUD placed.

## 2019-12-03 NOTE — Telephone Encounter (Signed)
Left message to call Gill Delrossi, RN at [facility] [phone].   

## 2019-12-03 NOTE — Telephone Encounter (Signed)
Left message to call Demorris Choyce, RN at [facility] [phone].   Last AEX 07/01/19 

## 2019-12-03 NOTE — Telephone Encounter (Signed)
Spoke with patient. Patient reports increased acne with Depo-provera, is requesting to change to an IUD. Last Depo-provera injection received on 11/20/19. Mirena IUD removed 06/23/19, witched to POP then depo. Patient denies any other GYN concerns. Advised patient she will need an OV for consult, patient is agreeable to MyChart visit. Advised can schedule as MyChart visit, will review with Dr. Edward Jolly, if visit needs to be an in person OV our office will return call, patient agreeable.   MyChart visit scheduled for 12/18/19 at 4:30pm.   Routing to provider for final review. Patient is agreeable to disposition. Will close encounter.

## 2019-12-03 NOTE — Telephone Encounter (Signed)
Patient returned a call to Jill.   

## 2019-12-04 ENCOUNTER — Telehealth: Payer: Self-pay | Admitting: Obstetrics and Gynecology

## 2019-12-04 DIAGNOSIS — Z3009 Encounter for other general counseling and advice on contraception: Secondary | ICD-10-CM

## 2019-12-04 NOTE — Telephone Encounter (Signed)
Please contact patient back about her request for a Mirena IUD.   I am ok with just scheduling an appointment for her Mirena IUD insertion prior to her next Depo Provera being due. She does not need a video visit unless she has a lot of questions.    She has had a Mirena in the past.   My question is regarding her Plavix.  If she is still taking it, she will need to come off of this prior to the IUD insertion. Her neurologist will likely need to give a recommendation about when to stop and restart this with her procedure.  Please reach out to her neurologist after confirming her status.

## 2019-12-04 NOTE — Telephone Encounter (Signed)
Spoke with patient, advised per Dr. Edward Jolly.  Patient request to proceed with Mirena IUD insertion.  Patient reports she is no longer taking Plavix. Medication list updated.  Order placed for Mirena IUD insertion.  Scheduled for 01/15/20 at 4pm w/ Dr. Edward Jolly.  MyChart visit cancelled for 12/18/19 Patient verbalizes understanding and is agreeable.  Routing to provider for final review. Patient is agreeable to disposition. Will close encounter.   Cc: Webb Silversmith, Sound[place] Pilon

## 2019-12-10 ENCOUNTER — Telehealth: Payer: Self-pay | Admitting: Obstetrics and Gynecology

## 2019-12-10 NOTE — Telephone Encounter (Signed)
Spoke with patient regarding benefits for scheduled IUD insertion. Patient acknowledges understanding of information presented. Encounter closed. 

## 2019-12-18 ENCOUNTER — Telehealth: Payer: Self-pay | Admitting: Obstetrics and Gynecology

## 2019-12-22 ENCOUNTER — Telehealth: Payer: Self-pay | Admitting: Obstetrics and Gynecology

## 2019-12-22 NOTE — Telephone Encounter (Signed)
Call placed to pt. Pt needs to be rescheduled for Mirena IUD Insertion due to office schedule. Pt rescheduled for 9/16 at 830 am with Dr Edward Jolly and will also get 3rd Gardasil injection on same day. Pt agreeable and verbalized understanding of date and time of appt.   Encounter closed.

## 2019-12-22 NOTE — Telephone Encounter (Signed)
Patient is scheduled for procedure on 01/15/20 at 4pm that will need rescheduling because of CPR class. Sending to triage to reschedule.

## 2019-12-29 ENCOUNTER — Ambulatory Visit: Payer: 59

## 2020-01-05 ENCOUNTER — Encounter: Payer: Self-pay | Admitting: Neurology

## 2020-01-05 NOTE — Progress Notes (Addendum)
Rebecca Russo (Key: [id number]) Aimovig 70MG /ML auto-injectors   Form MedImpact ePA Form 2017 NCPDP Created 1 day ago Sent to Plan 8 minutes ago Plan Response 8 minutes ago Submit Clinical Questions 6 minutes ago Determination Favorable 5 minutes ago Message from Plan The request has been approved. The authorization is effective for a maximum of 12 fills from 01/05/2020 to 01/03/2021, as long as the member is enrolled in their current health plan. This has been approved for a quantity limit of 1.0 with a day supply limit of 30.0. A written notification letter will follow with additional details.

## 2020-01-15 ENCOUNTER — Ambulatory Visit: Payer: Self-pay | Admitting: Obstetrics and Gynecology

## 2020-01-21 NOTE — Progress Notes (Signed)
GYNECOLOGY  VISIT   HPI: 44 y.o.   Single  African American  female   [phone] with No LMP recorded. Patient has had an injection.   here for Mirena IUD insertion.   Patient would like STD screening. Here also for Gardasil injection.   She had a prior Mirena, removed on 06/23/19.  Her last Depo Provera on 11/20/19.  UPT:Neg  GYNECOLOGIC HISTORY: No LMP recorded. Patient has had an injection. Contraception:  Depo Provera Menopausal hormone therapy:  n/a Last mammogram: 07-23-19 3D/Neg/density BBiRads1 Last pap smear:  09-19-17 Neg:Neg HR HPV        OB History    Gravida  2   Para  2   Term  2   Preterm      AB      Living  2     SAB      TAB      Ectopic      Multiple      Live Births  2              Patient Active Problem List   Diagnosis Date Noted  . Morbid obesity with body mass index (BMI) of 50.0 to 59.9 in adult [facility]) 07/29/2018  . Left knee pain 06/20/2018  . Chronic systolic heart failure (HCC) 06/09/2016  . Depression 02/02/2015  . Obesity 06/04/2014  . Dysphagia, pharyngoesophageal phase 06/04/2014  . PFO with atrial septal aneurysm 06/04/2014  . Hyperlipidemia LDL goal <70 06/04/2014  . Ischemic brain damage   . Acute respiratory failure with hypoxia (HCC)   . Cerebrovascular accident (CVA) due to embolism of left middle cerebral artery (HCC) 05/29/2014  . Aphasia complicating stroke 05/29/2014  . Hemiplegia affecting dominant side (HCC) 05/29/2014  . CONSTIPATION 03/09/2008  . IRRITABLE BOWEL SYNDROME 03/09/2008    Past Medical History:  Diagnosis Date  . Aphasia complicating stroke   . Bilateral swelling of feet   . Chronic systolic heart failure (HCC) 06/09/2016   a - Prob nonischemic //  Echo 10/17: EF 40-45, diffuse HK, mild LAE, PFO closure device without residual shunt  //  Could not tol cMRI // Echo 4/18: EF 55, normal wall motion, normal diastolic function, trivial MR, PFO device in place without residual shunt, PASP 24  .  Constipation   . Eczema   . H/O varicella   . Hemiplegia affecting dominant side (HCC)   . History of ischemic left MCA stroke    weakness on right side  . Increased BMI 12/17/08  . LAE (left atrial enlargement) 04/05/2018   Mod, noted on ECHO  . Migraines   . Morbid obesity (HCC)   . PFO with atrial septal aneurysm 06/04/2014   a - s/p transcatheter closure in 2/16 with 25 mm Amplatzer Cribiform ASO device // b - Echo 10/17:  EF 40-45, diffuse HK, mild LAE, PFO closure device without residual shunt   . Stroke (cerebrum) (HCC)   . Vitamin D deficiency     Past Surgical History:  Procedure Laterality Date  . CESAREAN SECTION  1999  . COLON SURGERY  2004   partial colectomy  . LAPAROSCOPIC CHOLECYSTECTOMY  06/20/2012  . LAPAROSCOPIC GASTRIC SLEEVE RESECTION N/A 07/29/2018   Procedure: LAPAROSCOPIC GASTRIC SLEEVE RESECTION, UPPER ENDO, ERAS Pathway;  Surgeon: Glenna Fellows, MD;  Location: [facility] ORS;  Service: General;  Laterality: N/A;  . PATENT FORAMEN OVALE CLOSURE  07/09/2014  . PATENT FORAMEN OVALE CLOSURE N/A 07/09/2014   Procedure: PATENT FORAMEN OVALE CLOSURE;  Surgeon: Micheline Chapman, MD;  Location[facility] C[facility] LAB;  Service: Cardiovascular;  Laterality: N/A;  . RADIOLOGY WITH ANESTHESIA N/A 05/29/2014   Procedure: RADIOLOGY WITH ANESTHESIA;  Surgeon: Oneal Grout, MD;  Location: [facility] OR;  Service: Radiology;  Laterality: N/A;  . TEE WITHOUT CARDIOVERSION N/A 06/03/2014   Procedure: TRANSESOPHAGEAL ECHOCARDIOGRAM (TEE);  Surgeon: Wendall Stade, MD;  Location: [facility] ENDOSCOPY;  Service: Cardiovascular;  Laterality: N/A;    Current Outpatient Medications  Medication Sig Dispense Refill  . citalopram (CELEXA) 20 MG tablet Take 1 tablet (20 mg total) by mouth daily. 90 tablet 1  . Erenumab-aooe (AIMOVIG) 70 MG/ML SOAJ Inject 70 mg into the skin every 30 (thirty) days. 1 pen 11  . rosuvastatin (CRESTOR) 10 MG tablet Take 1 tablet (10 mg total) by mouth daily. 90 tablet 1  . VANIQA  13.9 % cream      No current facility-administered medications for this visit.     ALLERGIES: Patient has no known allergies.  Family History  Problem Relation Age of Onset  . Hypertension Mother   . Diabetes Mother   . Heart disease Mother   . Sleep apnea Mother   . Hypertension Father   . Stroke Father   . Diabetes Father   . Heart disease Father   . Stroke Paternal Grandfather   . Stroke Maternal Grandfather   . Cancer Other   . Breast cancer Neg Hx   . Colon cancer Neg Hx     Social History   Socioeconomic History  . Marital status: Single    Spouse name: Not on file  . Number of children: 2  . Years of education: Associates  . Highest education level: Associate degree: academic program  Occupational History  . Occupation: Radiology Scheduler    Comment: [employer]  Tobacco Use  . Smoking status: Never Smoker  . Smokeless tobacco: Never Used  Vaping Use  . Vaping Use: Never used  Substance and Sexual Activity  . Alcohol use: No    Alcohol/week: 0.0 standard drinks  . Drug use: No  . Sexual activity: Yes    Birth control/protection: Abstinence, Pill    Comment: [name]  Other Topics Concern  . Not on file  Social History Narrative   Patient is single with 2 children --> 25 and 20, both in college. Daughter is at [employer] studying nursing. Son at [employer].   Patient is right handed.   Patient has her Associates degree.   Patient drinks 1 soda a month.   Worked in radiology scheduling   Social Determinants of Health   Financial Resource Strain:   . Difficulty of Paying Living Expenses: Not on file  Food Insecurity:   . Worried About Programme researcher, broadcasting/film/video in the Last Year: Not on file  . Ran Out of Food in the Last Year: Not on file  Transportation Needs:   . Lack of Transportation (Medical): Not on file  . Lack of Transportation (Non-Medical): Not on file  Physical Activity:   . Days of Exercise per Week: Not on file  . Minutes of Exercise per Session: Not on  file  Stress:   . Feeling of Stress : Not on file  Social Connections:   . Frequency of Communication with Friends and Family: Not on file  . Frequency of Social Gatherings with Friends and Family: Not on file  . Attends Religious Services: Not on file  . Active Member of Clubs or Organizations: Not on file  .  Attends Banker Meetings: Not on file  . Marital Status: Not on file  Intimate Partner Violence:   . Fear of Current or Ex-Partner: Not on file  . Emotionally Abused: Not on file  . Physically Abused: Not on file  . Sexually Abused: Not on file    Review of Systems  All other systems reviewed and are negative.   PHYSICAL EXAMINATION:    BP 118/70 (Cuff Size: Large)   Pulse 80   Ht 5\' 10"  (1.778 m)   Wt 261 lb (118.4 kg)   BMI 37.45 kg/m     General appearance: alert, cooperative and appears stated age   Pelvic: External genitalia:  no lesions              Urethra:  normal appearing urethra with no masses, tenderness or lesions              Bartholins and Skenes: normal                 Vagina: normal appearing vagina with normal color and discharge, no lesions              Cervix: no lesions                Bimanual Exam:  Uterus:  normal size, contour, position, consistency, mobility, non-tender              Adnexa: no mass, fullness, tenderness            Mirena IUD insertion - lot , exp Oct 2023 Consent for procedure.  Sterile prep with Hibiclens.  Tenaculum to anterior cervical lip.  Uterus sounded to 8.5 cm.  Mirena placed without difficulty.  Strings trimmed.  Minimal EBL.  No complications.   Chaperone was present for exam.  ASSESSMENT  Mirena IUD insertion.  STD screening.  Gardasil injection.  PLAN  Mirena instructions and precautions given.  Back up protection for one week.  IUD card to patient.  STD screening done today.  Gardasil injection.  FU in 4 weeks for IUD check up.

## 2020-01-29 ENCOUNTER — Other Ambulatory Visit: Payer: Self-pay

## 2020-01-29 ENCOUNTER — Ambulatory Visit (INDEPENDENT_AMBULATORY_CARE_PROVIDER_SITE_OTHER): Payer: 59 | Admitting: Obstetrics and Gynecology

## 2020-01-29 ENCOUNTER — Encounter: Payer: Self-pay | Admitting: Obstetrics and Gynecology

## 2020-01-29 ENCOUNTER — Other Ambulatory Visit (HOSPITAL_COMMUNITY)
Admission: RE | Admit: 2020-01-29 | Discharge: 2020-01-29 | Disposition: A | Discharge status: Home / Self Care | Payer: 59 | Source: Ambulatory Visit | Attending: Obstetrics and Gynecology | Admitting: Obstetrics and Gynecology

## 2020-01-29 VITALS — BP 118/70 | HR 80 | Ht 70.0 in | Wt 261.0 lb

## 2020-01-29 DIAGNOSIS — Z01812 Encounter for preprocedural laboratory examination: Secondary | ICD-10-CM | POA: Diagnosis not present

## 2020-01-29 DIAGNOSIS — Z113 Encounter for screening for infections with a predominantly sexual mode of transmission: Secondary | ICD-10-CM | POA: Diagnosis not present

## 2020-01-29 DIAGNOSIS — Z30430 Encounter for insertion of intrauterine contraceptive device: Secondary | ICD-10-CM

## 2020-01-29 DIAGNOSIS — Z23 Encounter for immunization: Secondary | ICD-10-CM

## 2020-01-29 DIAGNOSIS — Z3009 Encounter for other general counseling and advice on contraception: Secondary | ICD-10-CM | POA: Diagnosis not present

## 2020-01-29 LAB — POCT URINE PREGNANCY: Preg Test, Ur: NEGATIVE

## 2020-01-29 NOTE — Patient Instructions (Signed)
Intrauterine Device Insertion, Care After  This sheet gives you information about how to care for yourself after your procedure. Your health care provider may also give you more specific instructions. If you have problems or questions, contact your health care provider. What can I expect after the procedure? After the procedure, it is common to have:  Cramps and pain in the abdomen.  Light bleeding (spotting) or heavier bleeding that is like your menstrual period. This may last for up to a few days.  Lower back pain.  Dizziness.  Headaches.  Nausea. Follow these instructions at home:  Before resuming sexual activity, check to make sure that you can feel the IUD string(s). You should be able to feel the end of the string(s) below the opening of your cervix. If your IUD string is in place, you may resume sexual activity. ? If you had a hormonal IUD inserted more than 7 days after your most recent period started, you will need to use a backup method of birth control for 7 days after IUD insertion. Ask your health care provider whether this applies to you.  Continue to check that the IUD is still in place by feeling for the string(s) after every menstrual period, or once a month.  Take over-the-counter and prescription medicines only as told by your health care provider.  Do not drive or use heavy machinery while taking prescription pain medicine.  Keep all follow-up visits as told by your health care provider. This is important. Contact a health care provider if:  You have bleeding that is heavier or lasts longer than a normal menstrual cycle.  You have a fever.  You have cramps or abdominal pain that get worse or do not get better with medicine.  You develop abdominal pain that is new or is not in the same area of earlier cramping and pain.  You feel lightheaded or weak.  You have abnormal or bad-smelling discharge from your vagina.  You have pain during sexual  activity.  You have any of the following problems with your IUD string(s): ? The string bothers or hurts you or your sexual partner. ? You cannot feel the string. ? The string has gotten longer.  You can feel the IUD in your vagina.  You think you may be pregnant, or you miss your menstrual period.  You think you may have an STI (sexually transmitted infection). Get help right away if:  You have flu-like symptoms.  You have a fever and chills.  You can feel that your IUD has slipped out of place. Summary  After the procedure, it is common to have cramps and pain in the abdomen. It is also common to have light bleeding (spotting) or heavier bleeding that is like your menstrual period.  Continue to check that the IUD is still in place by feeling for the string(s) after every menstrual period, or once a month.  Keep all follow-up visits as told by your health care provider. This is important.  Contact your health care provider if you have problems with your IUD string(s), such as the string getting longer or bothering you or your sexual partner. This information is not intended to replace advice given to you by your health care provider. Make sure you discuss any questions you have with your health care provider. Document Revised: 04/13/2017 Document Reviewed: 03/22/2016 Elsevier Patient Education  2020 [employer]  

## 2020-01-30 LAB — CERVICOVAGINAL ANCILLARY ONLY
Chlamydia: NEGATIVE
Comment: NEGATIVE
Comment: NEGATIVE
Comment: NORMAL
Neisseria Gonorrhea: NEGATIVE
Trichomonas: NEGATIVE

## 2020-01-30 LAB — HEPATITIS C ANTIBODY: Hep C Virus Ab: <0.1 s/co ratio (ref 0.0–0.9)

## 2020-01-30 LAB — HEP, RPR, HIV PANEL
HIV Screen 4th Generation wRfx: NONREACTIVE
Hepatitis B Surface Ag: NEGATIVE
RPR Ser Ql: NONREACTIVE

## 2020-02-25 ENCOUNTER — Encounter (HOSPITAL_COMMUNITY): Payer: Self-pay

## 2020-02-27 ENCOUNTER — Ambulatory Visit (INDEPENDENT_AMBULATORY_CARE_PROVIDER_SITE_OTHER): Payer: 59 | Admitting: Obstetrics and Gynecology

## 2020-02-27 ENCOUNTER — Other Ambulatory Visit: Payer: Self-pay

## 2020-02-27 ENCOUNTER — Encounter: Payer: Self-pay | Admitting: Obstetrics and Gynecology

## 2020-02-27 VITALS — BP 100/60 | HR 72 | Resp 14 | Ht 70.0 in | Wt 264.0 lb

## 2020-02-27 DIAGNOSIS — Z30431 Encounter for routine checking of intrauterine contraceptive device: Secondary | ICD-10-CM

## 2020-02-27 NOTE — Progress Notes (Signed)
GYNECOLOGY  VISIT   HPI: 44 y.o.   Single  African American  female   [phone] with No LMP recorded. (Menstrual status: IUD).   here for IUD recheck.  Did well after insertion.  No bleeding.  No pain.   Sexually active. No pain for her self or her partner.   GYNECOLOGIC HISTORY: No LMP recorded. (Menstrual status: IUD). Contraception:  Mirena IUD inserted 01/29/20 Menopausal hormone therapy:  none Last mammogram:  07-23-19 3D/Neg/density [id number] Last pap smear:   09/19/17 Neg:Neg HR HPV        OB History    Gravida  2   Para  2   Term  2   Preterm      AB      Living  2     SAB      TAB      Ectopic      Multiple      Live Births  2              Patient Active Problem List   Diagnosis Date Noted  . Morbid obesity with body mass index (BMI) of 50.0 to 59.9 in adult [facility]) 07/29/2018  . Left knee pain 06/20/2018  . Chronic systolic heart failure (HCC) 06/09/2016  . Depression 02/02/2015  . Obesity 06/04/2014  . Dysphagia, pharyngoesophageal phase 06/04/2014  . PFO with atrial septal aneurysm 06/04/2014  . Hyperlipidemia LDL goal <70 06/04/2014  . Ischemic brain damage   . Acute respiratory failure with hypoxia (HCC)   . Cerebrovascular accident (CVA) due to embolism of left middle cerebral artery (HCC) 05/29/2014  . Aphasia complicating stroke 05/29/2014  . Hemiplegia affecting dominant side (HCC) 05/29/2014  . CONSTIPATION 03/09/2008  . IRRITABLE BOWEL SYNDROME 03/09/2008    Past Medical History:  Diagnosis Date  . Aphasia complicating stroke   . Bilateral swelling of feet   . Chronic systolic heart failure (HCC) 06/09/2016   a - Prob nonischemic //  Echo 10/17: EF 40-45, diffuse HK, mild LAE, PFO closure device without residual shunt  //  Could not tol cMRI // Echo 4/18: EF 55, normal wall motion, normal diastolic function, trivial MR, PFO device in place without residual shunt, PASP 24  . Constipation   . Eczema   . H/O varicella   .  Hemiplegia affecting dominant side (HCC)   . History of ischemic left MCA stroke    weakness on right side  . Increased BMI 12/17/08  . LAE (left atrial enlargement) 04/05/2018   Mod, noted on ECHO  . Migraines   . Morbid obesity (HCC)   . PFO with atrial septal aneurysm 06/04/2014   a - s/p transcatheter closure in 2/16 with 25 mm Amplatzer Cribiform ASO device // b - Echo 10/17:  EF 40-45, diffuse HK, mild LAE, PFO closure device without residual shunt   . Stroke (cerebrum) (HCC)   . Vitamin D deficiency     Past Surgical History:  Procedure Laterality Date  . CESAREAN SECTION  1999  . COLON SURGERY  2004   partial colectomy  . LAPAROSCOPIC CHOLECYSTECTOMY  06/20/2012  . LAPAROSCOPIC GASTRIC SLEEVE RESECTION N/A 07/29/2018   Procedure: LAPAROSCOPIC GASTRIC SLEEVE RESECTION, UPPER ENDO, ERAS Pathway;  Surgeon: Glenna Fellows, MD;  Location: [facility];  Service: General;  Laterality: N/A;  . PATENT FORAMEN OVALE CLOSURE  07/09/2014  . PATENT FORAMEN OVALE CLOSURE N/A 07/09/2014   Procedure: PATENT FORAMEN OVALE CLOSURE;  Surgeon: Micheline Chapman, MD;  Location: [facility]  CATH LAB;  Service: Cardiovascular;  Laterality: N/A;  . RADIOLOGY WITH ANESTHESIA N/A 05/29/2014   Procedure: RADIOLOGY WITH ANESTHESIA;  Surgeon: Oneal Grout, MD;  Location: [facility] OR;  Service: Radiology;  Laterality: N/A;  . TEE WITHOUT CARDIOVERSION N/A 06/03/2014   Procedure: TRANSESOPHAGEAL ECHOCARDIOGRAM (TEE);  Surgeon: Wendall Stade, MD;  Location: [facility] ENDOSCOPY;  Service: Cardiovascular;  Laterality: N/A;    Current Outpatient Medications  Medication Sig Dispense Refill  . citalopram (CELEXA) 20 MG tablet Take 1 tablet (20 mg total) by mouth daily. 90 tablet 1  . Erenumab-aooe (AIMOVIG) 70 MG/ML SOAJ Inject 70 mg into the skin every 30 (thirty) days. 1 pen 11  . rosuvastatin (CRESTOR) 10 MG tablet Take 1 tablet (10 mg total) by mouth daily. 90 tablet 1  . VANIQA 13.9 % cream      No current facility-administered  medications for this visit.     ALLERGIES: Patient has no known allergies.  Family History  Problem Relation Age of Onset  . Hypertension Mother   . Diabetes Mother   . Heart disease Mother   . Sleep apnea Mother   . Hypertension Father   . Stroke Father   . Diabetes Father   . Heart disease Father   . Stroke Paternal Grandfather   . Stroke Maternal Grandfather   . Cancer Other   . Breast cancer Neg Hx   . Colon cancer Neg Hx     Social History   Socioeconomic History  . Marital status: Single    Spouse name: Not on file  . Number of children: 2  . Years of education: Associates  . Highest education level: Associate degree: academic program  Occupational History  . Occupation: Radiology Scheduler    Comment: [employer]  Tobacco Use  . Smoking status: Never Smoker  . Smokeless tobacco: Never Used  Vaping Use  . Vaping Use: Never used  Substance and Sexual Activity  . Alcohol use: No    Alcohol/week: 0.0 standard drinks  . Drug use: No  . Sexual activity: Yes    Birth control/protection: Abstinence, Pill    Comment: [name]  Other Topics Concern  . Not on file  Social History Narrative   Patient is single with 2 children --> 25 and 20, both in college. Daughter is at [employer] studying nursing. Son at [place].   Patient is right handed.   Patient has her Associates degree.   Patient drinks 1 soda a month.   Worked in radiology scheduling   Social Determinants of Health   Financial Resource Strain:   . Difficulty of Paying Living Expenses: Not on file  Food Insecurity:   . Worried About Programme researcher, broadcasting/film/video in the Last Year: Not on file  . Ran Out of Food in the Last Year: Not on file  Transportation Needs:   . Lack of Transportation (Medical): Not on file  . Lack of Transportation (Non-Medical): Not on file  Physical Activity:   . Days of Exercise per Week: Not on file  . Minutes of Exercise per Session: Not on file  Stress:   . Feeling of Stress : Not on file   Social Connections:   . Frequency of Communication with Friends and Family: Not on file  . Frequency of Social Gatherings with Friends and Family: Not on file  . Attends Religious Services: Not on file  . Active Member of Clubs or Organizations: Not on file  . Attends Banker Meetings: Not on  file  . Marital Status: Not on file  Intimate Partner Violence:   . Fear of Current or Ex-Partner: Not on file  . Emotionally Abused: Not on file  . Physically Abused: Not on file  . Sexually Abused: Not on file    Review of Systems  Constitutional: Negative.   HENT: Negative.   Eyes: Negative.   Respiratory: Negative.   Cardiovascular: Negative.   Gastrointestinal: Negative.   Endocrine: Negative.   Genitourinary: Negative.   Musculoskeletal: Negative.   Skin: Negative.   Allergic/Immunologic: Negative.   Neurological: Negative.   Hematological: Negative.   Psychiatric/Behavioral: Negative.     PHYSICAL EXAMINATION:    BP 100/60 (BP Location: Right Arm, Patient Position: Sitting, Cuff Size: Large)   Pulse 72   Resp 14   Ht 5\' 10"  (1.778 m)   Wt 264 lb (119.7 kg)   BMI 37.88 kg/m     General appearance: alert, cooperative and appears stated age   Pelvic: External genitalia:  no lesions              Urethra:  normal appearing urethra with no masses, tenderness or lesions              Bartholins and Skenes: normal                 Vagina: normal appearing vagina with normal color and discharge, no lesions              Cervix: no lesions.  IUD strings noted and cut to 2 cm length.                 Bimanual Exam:  Uterus:  normal size, contour, position, consistency, mobility, non-tender              Adnexa: no mass, fullness, tenderness               Chaperone was present for exam.  ASSESSMENT  Mirena IUD check up.   Doing well.   PLAN  Reassurance regarding IUD position and effectiveness for pregnancy prevention.  Mirena IUD approved for 7 years.  FU for  annual exam and prn.

## 2020-05-10 ENCOUNTER — Encounter (HOSPITAL_COMMUNITY): Payer: Self-pay | Admitting: Emergency Medicine

## 2020-05-10 ENCOUNTER — Other Ambulatory Visit: Payer: Self-pay

## 2020-05-10 ENCOUNTER — Ambulatory Visit (HOSPITAL_COMMUNITY)
Admission: EM | Admit: 2020-05-10 | Discharge: 2020-05-10 | Disposition: A | Discharge status: Home / Self Care | Payer: 59 | Attending: Physician Assistant | Admitting: Physician Assistant

## 2020-05-10 DIAGNOSIS — R0981 Nasal congestion: Secondary | ICD-10-CM | POA: Insufficient documentation

## 2020-05-10 DIAGNOSIS — Z20822 Contact with and (suspected) exposure to covid-19: Secondary | ICD-10-CM | POA: Insufficient documentation

## 2020-05-10 DIAGNOSIS — R059 Cough, unspecified: Secondary | ICD-10-CM | POA: Insufficient documentation

## 2020-05-10 DIAGNOSIS — R6883 Chills (without fever): Secondary | ICD-10-CM | POA: Diagnosis not present

## 2020-05-10 NOTE — Discharge Instructions (Addendum)
Remain in quarantine per CDC guidelines.

## 2020-05-10 NOTE — ED Provider Notes (Signed)
[facility]    CSN: [id number] Arrival date & time: 05/10/20  1805      History   Chief Complaint No chief complaint on file.   HPI Rebecca Russo is a 44 y.o. female.   Patient here c/w nasal congestion x 2 days. Admits chills, sore throat, cough.  Denies fever, otalgia, n/v/d, abdominal pain, wheezing, SOB.  She was exposed to cOVID all weekend long, her cousin had URI like sx, did not get tested until yesterday and tested positive.  No advil or tylenol today.  She has had COVID vaccine.     Past Medical History:  Diagnosis Date  . Aphasia complicating stroke   . Bilateral swelling of feet   . Chronic systolic heart failure (HCC) 06/09/2016   a - Prob nonischemic //  Echo 10/17: EF 40-45, diffuse HK, mild LAE, PFO closure device without residual shunt  //  Could not tol cMRI // Echo 4/18: EF 55, normal wall motion, normal diastolic function, trivial MR, PFO device in place without residual shunt, PASP 24  . Constipation   . Eczema   . H/O varicella   . Hemiplegia affecting dominant side (HCC)   . History of ischemic left MCA stroke    weakness on right side  . Increased BMI 12/17/08  . LAE (left atrial enlargement) 04/05/2018   Mod, noted on ECHO  . Migraines   . Morbid obesity (HCC)   . PFO with atrial septal aneurysm 06/04/2014   a - s/p transcatheter closure in 2/16 with 25 mm Amplatzer Cribiform ASO device // b - Echo 10/17:  EF 40-45, diffuse HK, mild LAE, PFO closure device without residual shunt   . Stroke (cerebrum) (HCC)   . Vitamin D deficiency     Patient Active Problem List   Diagnosis Date Noted  . Morbid obesity with body mass index (BMI) of 50.0 to 59.9 in adult [facility]) 07/29/2018  . Left knee pain 06/20/2018  . Chronic systolic heart failure (HCC) 06/09/2016  . Depression 02/02/2015  . Obesity 06/04/2014  . Dysphagia, pharyngoesophageal phase 06/04/2014  . PFO with atrial septal aneurysm 06/04/2014  . Hyperlipidemia LDL goal <70  06/04/2014  . Ischemic brain damage   . Acute respiratory failure with hypoxia (HCC)   . Cerebrovascular accident (CVA) due to embolism of left middle cerebral artery (HCC) 05/29/2014  . Aphasia complicating stroke 05/29/2014  . Hemiplegia affecting dominant side (HCC) 05/29/2014  . CONSTIPATION 03/09/2008  . IRRITABLE BOWEL SYNDROME 03/09/2008    Past Surgical History:  Procedure Laterality Date  . CESAREAN SECTION  1999  . COLON SURGERY  2004   partial colectomy  . LAPAROSCOPIC CHOLECYSTECTOMY  06/20/2012  . LAPAROSCOPIC GASTRIC SLEEVE RESECTION N/A 07/29/2018   Procedure: LAPAROSCOPIC GASTRIC SLEEVE RESECTION, UPPER ENDO, ERAS Pathway;  Surgeon: Glenna Fellows, MD;  Location: [facility];  Service: General;  Laterality: N/A;  . PATENT FORAMEN OVALE CLOSURE  07/09/2014  . PATENT FORAMEN OVALE CLOSURE N/A 07/09/2014   Procedure: PATENT FORAMEN OVALE CLOSURE;  Surgeon: Micheline Chapman, MD;  Location: [facility] CATH LAB;  Service: Cardiovascular;  Laterality: N/A;  . RADIOLOGY WITH ANESTHESIA N/A 05/29/2014   Procedure: RADIOLOGY WITH ANESTHESIA;  Surgeon: Oneal Grout, MD;  Location: [facility] OR;  Service: Radiology;  Laterality: N/A;  . TEE WITHOUT CARDIOVERSION N/A 06/03/2014   Procedure: TRANSESOPHAGEAL ECHOCARDIOGRAM (TEE);  Surgeon: Wendall Stade, MD;  Location: [facility] ENDOSCOPY;  Service: Cardiovascular;  Laterality: N/A;    OB History  Gravida  2   Para  2   Term  2   Preterm      AB      Living  2     SAB      IAB      Ectopic      Multiple      Live Births  2            Home Medications    Prior to Admission medications   Medication Sig Start Date End Date Taking? Authorizing Provider  citalopram (CELEXA) 20 MG tablet Take 1 tablet (20 mg total) by mouth daily. 05/17/18   [name], PA  Erenumab-aooe (AIMOVIG) 70 MG/ML SOAJ Inject 70 mg into the skin every 30 (thirty) days. 07/17/18   Everlena Cooper, Adam R, DO  rosuvastatin (CRESTOR) 10 MG tablet Take 1 tablet (10  mg total) by mouth daily. 06/14/18   [name], PA  VANIQA 13.9 % cream  12/16/18   [provider]    Family History Family History  Problem Relation Age of Onset  . Hypertension Mother   . Diabetes Mother   . Heart disease Mother   . Sleep apnea Mother   . Hypertension Father   . Stroke Father   . Diabetes Father   . Heart disease Father   . Stroke Paternal Grandfather   . Stroke Maternal Grandfather   . Cancer Other   . Breast cancer Neg Hx   . Colon cancer Neg Hx     Social History Social History   Tobacco Use  . Smoking status: Never Smoker  . Smokeless tobacco: Never Used  Vaping Use  . Vaping Use: Never used  Substance Use Topics  . Alcohol use: No    Alcohol/week: 0.0 standard drinks  . Drug use: No     Allergies   Patient has no known allergies.   Review of Systems Review of Systems  Constitutional: Positive for chills and fatigue. Negative for fever.  HENT: Positive for congestion, rhinorrhea and sore throat. Negative for ear pain, nosebleeds, postnasal drip, sinus pressure and sinus pain.   Eyes: Negative for pain and redness.  Respiratory: Positive for cough. Negative for shortness of breath and wheezing.   Gastrointestinal: Negative for abdominal pain, diarrhea, nausea and vomiting.  Musculoskeletal: Negative for arthralgias and myalgias.  Skin: Negative for rash.  Neurological: Negative for light-headedness and headaches.  Hematological: Negative for adenopathy. Does not bruise/bleed easily.  Psychiatric/Behavioral: Negative for confusion and sleep disturbance.     Physical Exam Triage Vital Signs ED Triage Vitals  Enc Vitals Group     BP 05/10/20 2031 119/79     Pulse Rate 05/10/20 2031 63     Resp 05/10/20 2031 20     Temp 05/10/20 2031 98.5 F (36.9 C)     Temp Source 05/10/20 2031 Oral     SpO2 05/10/20 2031 100 %     Weight --      Height --      Head Circumference --      Peak Flow --      Pain Score 05/10/20 2027  4     Pain Loc --      Pain Edu? --      Excl. in GC? --    No data found.  Updated Vital Signs BP 119/79 (BP Location: Left Arm)   Pulse 63   Temp 98.5 F (36.9 C) (Oral)   Resp 20   LMP 08/28/2019  SpO2 100%   Visual Acuity Right Eye Distance:   Left Eye Distance:   Bilateral Distance:    Right Eye Near:   Left Eye Near:    Bilateral Near:     Physical Exam Vitals and nursing note reviewed.  Constitutional:      General: She is not in acute distress.    Appearance: Normal appearance. She is not ill-appearing.  HENT:     Head: Normocephalic and atraumatic.  Eyes:     General: No scleral icterus.    Extraocular Movements: Extraocular movements intact.     Conjunctiva/sclera: Conjunctivae normal.  Cardiovascular:     Rate and Rhythm: Normal rate and regular rhythm.     Heart sounds: No murmur heard.   Pulmonary:     Effort: Pulmonary effort is normal. No respiratory distress.     Breath sounds: Normal breath sounds. No wheezing or rales.  Musculoskeletal:     Cervical back: Normal range of motion. No rigidity.  Skin:    Capillary Refill: Capillary refill takes less than 2 seconds.     Coloration: Skin is not jaundiced.     Findings: No rash.  Neurological:     General: No focal deficit present.     Mental Status: She is alert and oriented to person, place, and time.     Motor: No weakness.     Gait: Gait normal.  Psychiatric:        Mood and Affect: Mood normal.        Behavior: Behavior normal.      UC Treatments / Results  Labs (all labs ordered are listed, but only abnormal results are displayed) Labs Reviewed - No data to display  EKG   Radiology No results found.  Procedures Procedures (including critical care time)  Medications Ordered in UC Medications - No data to display  Initial Impression / Assessment and Plan / UC Course  I have reviewed the triage vital signs and the nursing notes.  Pertinent labs & imaging results that  were available during my care of the patient were reviewed by me and considered in my medical decision making (see chart for details).     Remain in quarantine per CDC guidelines. Presumed positive. Final Clinical Impressions(s) / UC Diagnoses   Final diagnoses:  Cough  Nasal congestion  Chills  Contact with and (suspected) exposure to covid-19     Discharge Instructions     Remain in quarantine per CDC guidelines.      ED Prescriptions    None     PDMP not reviewed this encounter.   Evern Core, PA-C 05/10/20 2043

## 2020-05-10 NOTE — ED Triage Notes (Signed)
Pt c/o headache, sore throat, upper chest pain, left ear pain x 2 days.

## 2020-05-11 LAB — SARS CORONAVIRUS 2 (TAT 6-24 HRS): SARS Coronavirus 2: NEGATIVE

## 2020-05-13 ENCOUNTER — Other Ambulatory Visit: Payer: 59

## 2020-05-24 ENCOUNTER — Other Ambulatory Visit: Payer: Self-pay

## 2020-05-24 ENCOUNTER — Encounter: Payer: Self-pay | Admitting: Cardiovascular Disease

## 2020-05-24 ENCOUNTER — Ambulatory Visit (INDEPENDENT_AMBULATORY_CARE_PROVIDER_SITE_OTHER): Payer: 59 | Admitting: Cardiovascular Disease

## 2020-05-24 VITALS — BP 110/78 | HR 61 | Ht 70.0 in | Wt 269.2 lb

## 2020-05-24 DIAGNOSIS — Q2112 Patent foramen ovale: Secondary | ICD-10-CM

## 2020-05-24 DIAGNOSIS — I428 Other cardiomyopathies: Secondary | ICD-10-CM

## 2020-05-24 DIAGNOSIS — Q211 Atrial septal defect: Secondary | ICD-10-CM

## 2020-05-24 NOTE — Progress Notes (Signed)
Cardiology Office Note:    Date:  05/24/2020   ID:  Rebecca Russo, DOB 02/17/1976, MRN [id number]  PCP:  [name], PA  [facility] Cardiologist:  Tonny Bollman, MD  [facility] Electrophysiologist:  None   Referring MD: [name], PA   Chief Complaint  Patient presents with  . Follow-up    PFO     History of Present Illness:    Rebecca Russo is a 45 y.o. female with a hx of PFO and cryptogenic stroke, presenting for follow-up evaluation.  The patient suffered a left MCA infarct in 2016 and was treated with mechanical thrombectomy.  She was found to have a large PFO with atrial septal aneurysm and she underwent transcatheter closure with a 25 mm cribriform device at that time.  LV function was mildly depressed with an LVEF of 40 to 45%.  Follow-up echo showed improvement in LV function into the low normal range at an EF of 50 to 55%. She has not been on medical therapy with low normal blood pressure.  The patient is here alone today. She has been doing well from a cardiac perspective. She denies chest pain, chest pressure, shortness of breath, orthopnea, PND, or heart palpitations. She has had no lightheadedness or syncope. 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   Past Medical History:  Diagnosis Date  . Aphasia complicating stroke   . Bilateral swelling of feet   . Chronic systolic heart failure (HCC) 06/09/2016   a - Prob nonischemic //  Echo 10/17: EF 40-45, diffuse HK, mild LAE, PFO closure device without residual shunt  //  Could not tol cMRI // Echo 4/18: EF 55, normal wall motion, normal diastolic function, trivial MR, PFO device in place without residual shunt, PASP 24  . Constipation   . Eczema   . H/O varicella   . Hemiplegia affecting dominant side (HCC)   . History of ischemic left MCA stroke    weakness on right side  . Increased BMI 12/17/08  . LAE (left atrial enlargement) 04/05/2018   Mod, noted on ECHO  .  Migraines   . Morbid obesity (HCC)   . PFO with atrial septal aneurysm 06/04/2014   a - s/p transcatheter closure in 2/16 with 25 mm Amplatzer Cribiform ASO device // b - Echo 10/17:  EF 40-45, diffuse HK, mild LAE, PFO closure device without residual shunt   . Stroke (cerebrum) (HCC)   . Vitamin D deficiency     Past Surgical History:  Procedure Laterality Date  . CESAREAN SECTION  1999  . COLON SURGERY  2004   partial colectomy  . LAPAROSCOPIC CHOLECYSTECTOMY  06/20/2012  . LAPAROSCOPIC GASTRIC SLEEVE RESECTION N/A 07/29/2018   Procedure: LAPAROSCOPIC GASTRIC SLEEVE RESECTION, UPPER ENDO, ERAS Pathway;  Surgeon: Glenna Fellows, MD;  Location: [facility];  Service: General;  Laterality: N/A;  . PATENT FORAMEN OVALE CLOSURE  07/09/2014  . PATENT FORAMEN OVALE CLOSURE N/A 07/09/2014   Procedure: PATENT FORAMEN OVALE CLOSURE;  Surgeon: Micheline Chapman, MD;  Location: [facility] CATH LAB;  Service: Cardiovascular;  Laterality: N/A;  . RADIOLOGY WITH ANESTHESIA N/A 05/29/2014   Procedure: RADIOLOGY WITH ANESTHESIA;  Surgeon: Oneal Grout, MD;  [facility]: [facility] OR;  Service: Radiology;  Laterality: N/A;  . TEE WITHOUT CARDIOVERSION N/A 06/03/2014   Procedure: TRANSESOPHAGEAL ECHOCARDIOGRAM (TEE);  Surgeon: Wendall Stade, MD;  Location: [facility] ENDOSCOPY;  Service: Cardiovascular;  Laterality: N/A;    Current Medications: No outpatient medications  have been marked as taking for the 05/24/20 encounter (Office Visit) with Tonny Bollman, MD.     Allergies:   Patient has no known allergies.   Social History   Socioeconomic History  . Marital status: Single    Spouse name: Not on file  . Number of children: 2  . Years of education: Associates  . Highest education level: Associate degree: academic program  Occupational History  . Occupation: Radiology Scheduler    Comment: [employer]  Tobacco Use  . Smoking status: Never Smoker  . Smokeless tobacco: Never Used  Vaping Use  . Vaping Use: Never used   Substance and Sexual Activity  . Alcohol use: No    Alcohol/week: 0.0 standard drinks  . Drug use: No  . Sexual activity: Yes    Birth control/protection: Abstinence, Pill    Comment: [name]  Other Topics Concern  . Not on file  Social History Narrative   Patient is single with 2 children --> 25 and 20, both in college. Daughter is at [employer] studying nursing. Son at [employer].   Patient is right handed.   Patient has her Associates degree.   Patient drinks 1 soda a month.   Worked in radiology scheduling   Social Determinants of Health   Financial Resource Strain: Not on file  Food Insecurity: Not on file  Transportation Needs: Not on file  Physical Activity: Not on file  Stress: Not on file  Social Connections: Not on file     Family History: The patient's family history includes Cancer in an other family member; Diabetes in her father and mother; Heart disease in her father and mother; Hypertension in her father and mother; Sleep apnea in her mother; Stroke in her father, maternal grandfather, and paternal grandfather. There is no history of Breast cancer or Colon cancer.  ROS:   Please see the history of present illness.    All other systems reviewed and are negative.  EKGs/Labs/Other Studies Reviewed:    The following studies were reviewed today: Echocardiogram 04/05/2018: Study Conclusions   - Left ventricle: The cavity size was normal. Wall thickness was  normal. Systolic function was normal. The estimated ejection  fraction was in the range of 50% to 55%. Wall motion was normal;  there were no regional wall motion abnormalities. Left  ventricular diastolic function parameters were normal.  - Left atrium: The atrium was moderately dilated.  - Atrial septum: There was an atrial septal aneurysm.   Impressions:   - Low normal LV systolic LV function; moderate LAE; atrial septal  aneurysm; PFO closure device not well visualized; no obvious  residual  PFO; GLS -17.9%.   EKG:  EKG is ordered today.  The ekg ordered today demonstrates normal sinus rhythm 61 bpm, within normal limits.  Recent Labs: No results found for requested labs within last 8760 hours.  Recent Lipid Panel    Component Value Date/Time   CHOL 170 06/13/2018 0835   CHOL 152 08/29/2017 1500   TRIG 175.0 (H) 06/13/2018 0835   HDL 33.90 (L) 06/13/2018 0835   HDL 48 08/29/2017 1500   CHOLHDL 5 06/13/2018 0835   VLDL 35.0 06/13/2018 0835   LDLCALC 101 (H) 06/13/2018 0835   LDLCALC 79 08/29/2017 1500     Risk Assessment/Calculations:       Physical Exam:    VS:  LMP 08/28/2019     Wt Readings from Last 3 Encounters:  02/27/20 264 lb (119.7 kg)  01/29/20 261 lb (118.4 kg)  11/20/19 269 lb 1.6 oz (122.1 kg)     GEN: Well nourished, well developed in no acute distress HEENT: Normal NECK: No JVD; No carotid bruits LYMPHATICS: No lymphadenopathy CARDIAC: RRR, no murmurs, rubs, gallops RESPIRATORY:  Clear to auscultation without rales, wheezing or rhonchi  ABDOMEN: Soft, non-tender, non-distended MUSCULOSKELETAL:  No edema; No deformity  SKIN: Warm and dry NEUROLOGIC:  Alert and oriented x 3 PSYCHIATRIC:  Normal affect   ASSESSMENT:    1. PFO (patent foramen ovale)   2. Non-ischemic cardiomyopathy (HCC)   3. Morbid obesity (HCC)    PLAN:    In order of problems listed above:  1. No recurrent stroke/TIA events. Postprocedural imaging demonstrated appropriate device position with no residual shunt. 2. LVEF has been mildly reduced, most recently the LVEF was 50 to 55%. The patient has not been on any medical therapy. She has no symptoms at this time. It has been over 2 years since her last echo study. I have recommended an updated echocardiogram to assess whether she might need any medical therapy. Blood pressure will likely be a limitation. 3. Patient holding her own with her weight. She continues on her exercise program as outlined above.  Medication  Adjustments/Labs and Tests Ordered: Current medicines are reviewed at length with the patient today.  Concerns regarding medicines are outlined above.  No orders of the defined types were placed in this encounter.  No orders of the defined types were placed in this encounter.   There are no Patient Instructions on file for this visit.   Signed, Tonny Bollman, MD  05/24/2020 9:50 AM    [facility]

## 2020-05-24 NOTE — Patient Instructions (Signed)
Medication Instructions:  Your provider recommends that you continue on your current medications as directed. Please refer to the Current Medication list given to you today.   *If you need a refill on your cardiac medications before your next appointment, please call your pharmacy*  Testing/Procedures: Your physician has requested that you have an echocardiogram. Echocardiography is a painless test that uses sound waves to create images of your heart. It provides your doctor with information about the size and shape of your heart and how well your heart's chambers and valves are working. This procedure takes approximately one hour. There are no restrictions for this procedure.    Follow-Up: Your provider recommends that you schedule a follow-up appointment AS NEEDED pending study results.

## 2020-06-03 ENCOUNTER — Ambulatory Visit: Payer: 59 | Admitting: Cardiovascular Disease

## 2020-06-11 ENCOUNTER — Other Ambulatory Visit: Payer: Self-pay

## 2020-06-11 ENCOUNTER — Ambulatory Visit (HOSPITAL_COMMUNITY): Payer: 59 | Attending: Cardiology

## 2020-06-11 DIAGNOSIS — Q2112 Patent foramen ovale: Secondary | ICD-10-CM

## 2020-06-11 DIAGNOSIS — I428 Other cardiomyopathies: Secondary | ICD-10-CM | POA: Diagnosis not present

## 2020-06-11 DIAGNOSIS — Q211 Atrial septal defect: Secondary | ICD-10-CM | POA: Diagnosis not present

## 2020-06-11 LAB — ECHOCARDIOGRAM COMPLETE
Area-P 1/2: 4.6 cm2
S' Lateral: 3.8 cm

## 2020-06-15 ENCOUNTER — Telehealth: Payer: Self-pay | Admitting: Cardiovascular Disease

## 2020-06-15 NOTE — Telephone Encounter (Signed)
Rebecca Bollman, MD  06/13/2020 1:21 PM EST      Normal echo study noted. Hx of low-normal LVEF, now completely normalized. No indication for medical therapy at this point. PFO occluder in normal position with no residual shunt.     Reviewed results with patient who verbalized understanding.

## 2020-06-15 NOTE — Telephone Encounter (Signed)
Patient is returning Kathryn's call. Please advise.

## 2020-06-25 ENCOUNTER — Other Ambulatory Visit: Payer: Self-pay | Admitting: Obstetrics and Gynecology

## 2020-06-25 DIAGNOSIS — Z1231 Encounter for screening mammogram for malignant neoplasm of breast: Secondary | ICD-10-CM

## 2020-06-28 ENCOUNTER — Ambulatory Visit (INDEPENDENT_AMBULATORY_CARE_PROVIDER_SITE_OTHER): Payer: 59 | Admitting: Family Medicine

## 2020-06-28 ENCOUNTER — Encounter: Payer: Self-pay | Admitting: Family Medicine

## 2020-06-28 ENCOUNTER — Other Ambulatory Visit: Payer: Self-pay

## 2020-06-28 VITALS — BP 118/76 | HR 52 | Wt 271.8 lb

## 2020-06-28 DIAGNOSIS — Z7689 Persons encountering health services in other specified circumstances: Secondary | ICD-10-CM

## 2020-06-28 NOTE — Progress Notes (Signed)
   Patient presented to our clinic today to establish care.  Discussed that within our residency clinic she could expect a new provider every several years.  Patient did not realize what a residency clinic entailed and would like to establish with a provider that can continue with long-term continuity instead.  Discussed alternative family medicine practices within the area for her to seek care.  She reports she has no concerns today and does not need any medications refilled.  Allayne Stack, DO [facility]

## 2020-07-23 ENCOUNTER — Telehealth: Payer: Self-pay | Admitting: Cardiovascular Disease

## 2020-07-23 NOTE — Telephone Encounter (Signed)
The patient reports dry heaving and vomiting at night ever since around the time of her echo in January. She had gastric surgery a couple years ago and her surgeon is now retired. Her PCP has retired as well and she cannot reestablish until mid April. Reiterated to her that this is likely not because of her echo. She will call her old PCP's office on Monday (she was last seen about 1 year ago) and ask to be seen. She will call her old surgeon's office as well and ask for recommendations. She was grateful for time spent.

## 2020-07-23 NOTE — Telephone Encounter (Signed)
New message:    Patient calling stating that she had a ultrasound on heart. Patient states she is throwing up a lot and it makes her her throat hurts and feels different.

## 2020-08-03 NOTE — Progress Notes (Signed)
45 y.o. [id number] Single African American female here for annual exam.    Has spotting with her Mirena IUD.  It comes and goes.  Not bothersome.  Not associated with pain.   Having dry heaves for a while and happening every day.  Had gastric sleeve 2 years ago.  Did have pain last night.   Wants STD testing.  Did her Covid booster.   UPT:  Negative.  PCP: Has new provider    Patient's last menstrual period was 08/28/2019.     Period Cycle (Days):  (Has Mirena IUD)     Sexually active: Yes.    The current method of family planning is IUD--Mirena 01/29/20.    Exercising: Yes.    works out at gym Smoker:  no  Health Maintenance: Pap: 09-19-17 Neg:Neg HR HPV  History of abnormal Pap:  no MMG: 07-23-19 Neg/BiRads1--appt. 08/13/20 Colonoscopy: ~2000 due to constipation/abd.pain--had partial colectomy BMD:   n/a  Result  n/a TDaP:  2019 Gardasil:   no HIV:01-29-20 NR Hep C: 01-29-20 Neg Screening Labs:  PCP.   reports that she has never smoked. She has never used smokeless tobacco. She reports that she does not drink alcohol and does not use drugs.  Past Medical History:  Diagnosis Date  . Aphasia complicating stroke   . Bilateral swelling of feet   . Chronic systolic heart failure (HCC) 06/09/2016   a - Prob nonischemic //  Echo 10/17: EF 40-45, diffuse HK, mild LAE, PFO closure device without residual shunt  //  Could not tol cMRI // Echo 4/18: EF 55, normal wall motion, normal diastolic function, trivial MR, PFO device in place without residual shunt, PASP 24  . Constipation   . Eczema   . H/O varicella   . Hemiplegia affecting dominant side (HCC)   . History of ischemic left MCA stroke    weakness on right side  . Increased BMI 12/17/08  . LAE (left atrial enlargement) 04/05/2018   Mod, noted on ECHO  . Migraines   . Morbid obesity (HCC)   . PFO with atrial septal aneurysm 06/04/2014   a - s/p transcatheter closure in 2/16 with 25 mm Amplatzer Cribiform ASO device // b -  Echo 10/17:  EF 40-45, diffuse HK, mild LAE, PFO closure device without residual shunt   . Stroke (cerebrum) (HCC)   . Vitamin D deficiency     Past Surgical History:  Procedure Laterality Date  . CESAREAN SECTION  1999  . COLON SURGERY  2004   partial colectomy  . LAPAROSCOPIC CHOLECYSTECTOMY  06/20/2012  . LAPAROSCOPIC GASTRIC SLEEVE RESECTION N/A 07/29/2018   Procedure: LAPAROSCOPIC GASTRIC SLEEVE RESECTION, UPPER ENDO, ERAS Pathway;  Surgeon: Glenna Fellows, MD;  Location: [facility];  Service: General;  Laterality: N/A;  . PATENT FORAMEN OVALE CLOSURE  07/09/2014  . PATENT FORAMEN OVALE CLOSURE N/A 07/09/2014   Procedure: PATENT FORAMEN OVALE CLOSURE;  Surgeon: Micheline Chapman, MD;  Location: [facility] CATH LAB;  Service: Cardiovascular;  Laterality: N/A;  . RADIOLOGY WITH ANESTHESIA N/A 05/29/2014   Procedure: RADIOLOGY WITH ANESTHESIA;  Surgeon: Oneal Grout, MD;  Location: [facility] OR;  Service: Radiology;  Laterality: N/A;  . TEE WITHOUT CARDIOVERSION N/A 06/03/2014   Procedure: TRANSESOPHAGEAL ECHOCARDIOGRAM (TEE);  Surgeon: Wendall Stade, MD;  Location: [facility] ENDOSCOPY;  Service: Cardiovascular;  Laterality: N/A;    Current Outpatient Medications  Medication Sig Dispense Refill  . levonorgestrel (MIRENA, 52 MG,) 20 MCG/24HR IUD Mirena 20 mcg/24 hours (7 yrs) 53  mg intrauterine device  Take 1 device by intrauterine route.     No current facility-administered medications for this visit.    Family History  Problem Relation Age of Onset  . Hypertension Mother   . Diabetes Mother   . Heart disease Mother   . Sleep apnea Mother   . Hypertension Father   . Stroke Father   . Diabetes Father   . Heart disease Father   . Stroke Paternal Grandfather   . Stroke Maternal Grandfather   . Cancer Other   . Breast cancer Neg Hx   . Colon cancer Neg Hx     Review of Systems  Gastrointestinal: Positive for nausea (dry heaves--?related to gastric sleeve).  All other systems reviewed and are  negative.   Exam:   BP 114/62 (Cuff Size: Large)   Pulse 82   Ht 5\' 9"  (1.753 m)   Wt 268 lb (121.6 kg)   LMP 08/28/2019   SpO2 99%   BMI 39.58 kg/m     General appearance: alert, cooperative and appears stated age Head: normocephalic, without obvious abnormality, atraumatic Neck: no adenopathy, supple, symmetrical, trachea midline and thyroid normal to inspection and palpation Lungs: clear to auscultation bilaterally Breasts: normal appearance, no masses or tenderness, No nipple retraction or dimpling, No nipple discharge or bleeding, No axillary adenopathy Heart: regular rate and rhythm Abdomen: soft, non-tender; no masses, no organomegaly Extremities: extremities normal, atraumatic, no cyanosis or edema Skin: skin color, texture, turgor normal. No rashes or lesions Lymph nodes: cervical, supraclavicular, and axillary nodes normal. Neurologic: grossly normal  Pelvic: External genitalia:  no lesions              No abnormal inguinal nodes palpated.              Urethra:  normal appearing urethra with no masses, tenderness or lesions              Bartholins and Skenes: normal                 Vagina: normal appearing vagina with normal color and discharge, no lesions              Cervix: no lesions.  IUD strings not seen.               Pap taken: No. Bimanual Exam:  Uterus:  normal size, contour, position, consistency, mobility, non-tender              Adnexa: no mass, fullness, tenderness              Rectal exam: Yes.  .  Confirms.              Anus:  normal sphincter tone, no lesions  Chaperone was present for exam.  Assessment:   Well woman visit with normal exam. Mirena IUD. Lost IUD threads.  Status post CVA with hemiplegia and aphasia.  Status post Foramen ovale closure. 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 Status post gastric sleeve with successful weight loss.  Off Plavix.   Plan: Mammogram screening discussed. Self breast  awareness reviewed. Pap and HR HPV as above. Guidelines for Calcium, Vitamin D, regular exercise program including cardiovascular and weight bearing exercise. STD screening.  Return for pelvic 08/30/2019.   Use back up pregnancy protection until [place] confirms IUD position.  Follow up annually and prn.

## 2020-08-04 ENCOUNTER — Other Ambulatory Visit: Payer: Self-pay

## 2020-08-04 ENCOUNTER — Other Ambulatory Visit (HOSPITAL_COMMUNITY)
Admission: RE | Admit: 2020-08-04 | Discharge: 2020-08-04 | Disposition: A | Discharge status: Home / Self Care | Payer: 59 | Source: Ambulatory Visit | Attending: Obstetrics and Gynecology | Admitting: Obstetrics and Gynecology

## 2020-08-04 ENCOUNTER — Ambulatory Visit (INDEPENDENT_AMBULATORY_CARE_PROVIDER_SITE_OTHER): Payer: 59 | Admitting: Obstetrics and Gynecology

## 2020-08-04 ENCOUNTER — Encounter: Payer: Self-pay | Admitting: Obstetrics and Gynecology

## 2020-08-04 VITALS — BP 114/62 | HR 82 | Ht 69.0 in | Wt 268.0 lb

## 2020-08-04 DIAGNOSIS — T8332XA Displacement of intrauterine contraceptive device, initial encounter: Secondary | ICD-10-CM | POA: Diagnosis not present

## 2020-08-04 DIAGNOSIS — Z113 Encounter for screening for infections with a predominantly sexual mode of transmission: Secondary | ICD-10-CM

## 2020-08-04 DIAGNOSIS — R110 Nausea: Secondary | ICD-10-CM | POA: Diagnosis not present

## 2020-08-04 DIAGNOSIS — Z01419 Encounter for gynecological examination (general) (routine) without abnormal findings: Secondary | ICD-10-CM | POA: Diagnosis not present

## 2020-08-04 LAB — PREGNANCY, URINE: Preg Test, Ur: NEGATIVE

## 2020-08-04 NOTE — Patient Instructions (Signed)

## 2020-08-05 LAB — RPR: RPR Ser Ql: NONREACTIVE

## 2020-08-05 LAB — CERVICOVAGINAL ANCILLARY ONLY
Chlamydia: NEGATIVE
Comment: NEGATIVE
Comment: NEGATIVE
Comment: NORMAL
Neisseria Gonorrhea: NEGATIVE
Trichomonas: NEGATIVE

## 2020-08-05 LAB — HEPATITIS B SURFACE ANTIGEN: Hepatitis B Surface Ag: NONREACTIVE

## 2020-08-05 LAB — HEPATITIS C ANTIBODY
Hepatitis C Ab: NONREACTIVE
SIGNAL TO CUT-OFF: 0.01 (ref <1.00)

## 2020-08-05 LAB — HIV ANTIBODY (ROUTINE TESTING W REFLEX): HIV 1&2 Ab, 4th Generation: NONREACTIVE

## 2020-08-10 ENCOUNTER — Ambulatory Visit (INDEPENDENT_AMBULATORY_CARE_PROVIDER_SITE_OTHER): Payer: 59 | Admitting: Obstetrics and Gynecology

## 2020-08-10 ENCOUNTER — Other Ambulatory Visit (HOSPITAL_COMMUNITY): Payer: Self-pay | Admitting: Student

## 2020-08-10 ENCOUNTER — Encounter: Payer: Self-pay | Admitting: Obstetrics and Gynecology

## 2020-08-10 ENCOUNTER — Ambulatory Visit (INDEPENDENT_AMBULATORY_CARE_PROVIDER_SITE_OTHER): Payer: 59

## 2020-08-10 ENCOUNTER — Other Ambulatory Visit: Payer: Self-pay

## 2020-08-10 VITALS — BP 110/72 | HR 59 | Ht 69.0 in | Wt 268.0 lb

## 2020-08-10 DIAGNOSIS — Z30431 Encounter for routine checking of intrauterine contraceptive device: Secondary | ICD-10-CM | POA: Diagnosis not present

## 2020-08-10 DIAGNOSIS — Z09 Encounter for follow-up examination after completed treatment for conditions other than malignant neoplasm: Secondary | ICD-10-CM | POA: Diagnosis not present

## 2020-08-10 DIAGNOSIS — T8332XA Displacement of intrauterine contraceptive device, initial encounter: Secondary | ICD-10-CM

## 2020-08-10 MED FILL — ONDANSETRON HCL 4 MG TABLET: 4 | 5 days supply | Qty: 30 | Fill #0

## 2020-08-10 NOTE — Progress Notes (Signed)
GYNECOLOGY  VISIT   HPI: 45 y.o.   Single  African American  female   [phone] with Patient's last menstrual period was 08/28/2019.   here for pelvic ultrasound to check IUD position.   IUD strings were not seen on routine pelvic exam 08/04/20.  Has appointment with general surgeon today regarding her nausea and prior gastric sleeve surgery.   GYNECOLOGIC HISTORY: Patient's last menstrual period was 08/28/2019. Contraception:  Mirena IUD 01-29-20. Menopausal hormone therapy:  n/a Last mammogram: 07-23-19 Neg/BiRads1--appt. 08/13/20 Last pap smear: 09-19-17 Neg:Neg HR HPV         OB History    Gravida  2   Para  2   Term  2   Preterm      AB      Living  2     SAB      IAB      Ectopic      Multiple      Live Births  2              Patient Active Problem List   Diagnosis Date Noted  . Morbid obesity with body mass index (BMI) of 50.0 to 59.9 in adult [facility]) 07/29/2018  . Left knee pain 06/20/2018  . Chronic systolic heart failure (HCC) 06/09/2016  . Depression 02/02/2015  . Obesity 06/04/2014  . Dysphagia, pharyngoesophageal phase 06/04/2014  . PFO with atrial septal aneurysm 06/04/2014  . Hyperlipidemia LDL goal <70 06/04/2014  . Ischemic brain damage   . Acute respiratory failure with hypoxia (HCC)   . Cerebrovascular accident (CVA) due to embolism of left middle cerebral artery (HCC) 05/29/2014  . Aphasia complicating stroke 05/29/2014  . Hemiplegia affecting dominant side (HCC) 05/29/2014  . CONSTIPATION 03/09/2008  . IRRITABLE BOWEL SYNDROME 03/09/2008    Past Medical History:  Diagnosis Date  . Aphasia complicating stroke   . Bilateral swelling of feet   . Chronic systolic heart failure (HCC) 06/09/2016   a - Prob nonischemic //  Echo 10/17: EF 40-45, diffuse HK, mild LAE, PFO closure device without residual shunt  //  Could not tol cMRI // Echo 4/18: EF 55, normal wall motion, normal diastolic function, trivial MR, PFO device in place without  residual shunt, PASP 24  . Constipation   . Eczema   . H/O varicella   . Hemiplegia affecting dominant side (HCC)   . History of ischemic left MCA stroke    weakness on right side  . Increased BMI 12/17/08  . LAE (left atrial enlargement) 04/05/2018   Mod, noted on ECHO  . Migraines   . Morbid obesity (HCC)   . PFO with atrial septal aneurysm 06/04/2014   a - s/p transcatheter closure in 2/16 with 25 mm Amplatzer Cribiform ASO device // b - Echo 10/17:  EF 40-45, diffuse HK, mild LAE, PFO closure device without residual shunt   . Stroke (cerebrum) (HCC)   . Vitamin D deficiency     Past Surgical History:  Procedure Laterality Date  . CESAREAN SECTION  1999  . COLON SURGERY  2004   partial colectomy  . LAPAROSCOPIC CHOLECYSTECTOMY  06/20/2012  . LAPAROSCOPIC GASTRIC SLEEVE RESECTION N/A 07/29/2018   Procedure: LAPAROSCOPIC GASTRIC SLEEVE RESECTION, UPPER ENDO, ERAS Pathway;  Surgeon: Glenna Fellows, MD;  Location: [facility];  Service: General;  Laterality: N/A;  . PATENT FORAMEN OVALE CLOSURE  07/09/2014  . PATENT FORAMEN OVALE CLOSURE N/A 07/09/2014   Procedure: PATENT FORAMEN OVALE CLOSURE;  Surgeon: Veverly Fells  Excell Seltzer, MD;  Location: [facility] CATH LAB;  Service: Cardiovascular;  Laterality: N/A;  . RADIOLOGY WITH ANESTHESIA N/A 05/29/2014   Procedure: RADIOLOGY WITH ANESTHESIA;  Surgeon: Oneal Grout, MD;  Location: [facility] OR;  Service: Radiology;  Laterality: N/A;  . TEE WITHOUT CARDIOVERSION N/A 06/03/2014   Procedure: TRANSESOPHAGEAL ECHOCARDIOGRAM (TEE);  Surgeon: Wendall Stade, MD;  Location: [facility] ENDOSCOPY;  Service: Cardiovascular;  Laterality: N/A;    Current Outpatient Medications  Medication Sig Dispense Refill  . levonorgestrel (MIRENA, 52 MG,) 20 MCG/24HR IUD Mirena 20 mcg/24 hours (7 yrs) 52 mg intrauterine device  Take 1 device by intrauterine route.     No current facility-administered medications for this visit.     ALLERGIES: Patient has no known allergies.  Family  History  Problem Relation Age of Onset  . Hypertension Mother   . Diabetes Mother   . Heart disease Mother   . Sleep apnea Mother   . Hypertension Father   . Stroke Father   . Diabetes Father   . Heart disease Father   . Stroke Paternal Grandfather   . Stroke Maternal Grandfather   . Cancer Other   . Breast cancer Neg Hx   . Colon cancer Neg Hx     Social History   Socioeconomic History  . Marital status: Single    Spouse name: Not on file  . Number of children: 2  . Years of education: Associates  . Highest education level: Associate degree: academic program  Occupational History  . Occupation: Radiology Scheduler    Comment: [employer]  Tobacco Use  . Smoking status: Never Smoker  . Smokeless tobacco: Never Used  Vaping Use  . Vaping Use: Never used  Substance and Sexual Activity  . Alcohol use: No    Alcohol/week: 0.0 standard drinks  . Drug use: No  . Sexual activity: Yes    Birth control/protection: Abstinence  Other Topics Concern  . Not on file  Social History Narrative   Patient is single with 2 children --> 25 and 20, both in college. Daughter is at [employer] studying nursing. Son at [employer].   Patient is right handed.   Patient has her Associates degree.   Patient drinks 1 soda a month.   Worked in radiology scheduling   Social Determinants of Health   Financial Resource Strain: Not on file  Food Insecurity: Not on file  Transportation Needs: Not on file  Physical Activity: Not on file  Stress: Not on file  Social Connections: Not on file  Intimate Partner Violence: Not on file    Review of Systems  All other systems reviewed and are negative.   PHYSICAL EXAMINATION:    BP 110/72 (Cuff Size: Large)   Pulse (!) 59   Ht 5\' 9"  (1.753 m)   Wt 268 lb (121.6 kg)   LMP 08/28/2019   SpO2 98%   BMI 39.58 kg/m     General appearance: alert, cooperative and appears stated age  Pelvic 08/30/2019  Uterus with no myometrial masses.  IUD in endometrial  canal. Right ovary with 2.4 cm simple cyst.  Left ovary with 1.8 cm simple paraovarian cyst.  Normal blood flow bilaterally.  No adnexal masses.  No free fluid.  ASSESSMENT  IUD check up.    PLAN  Pelvic US images and report reviewed with patient.  Reassurance regarding IUD position and ovarian and paraovarian cysts.  No follow up US needed. Fu prn.   13 min  total time was spent  for this patient encounter, including preparation, face-to-face counseling with the patient, coordination of care, and documentation of the encounter.

## 2020-08-11 ENCOUNTER — Other Ambulatory Visit: Payer: Self-pay | Admitting: Student

## 2020-08-11 DIAGNOSIS — K912 Postsurgical malabsorption, not elsewhere classified: Secondary | ICD-10-CM

## 2020-08-13 ENCOUNTER — Ambulatory Visit
Admission: RE | Admit: 2020-08-13 | Discharge: 2020-08-13 | Disposition: A | Discharge status: Home / Self Care | Payer: 59 | Source: Ambulatory Visit | Attending: Obstetrics and Gynecology | Admitting: Obstetrics and Gynecology

## 2020-08-13 ENCOUNTER — Other Ambulatory Visit: Payer: Self-pay

## 2020-08-13 DIAGNOSIS — Z1231 Encounter for screening mammogram for malignant neoplasm of breast: Secondary | ICD-10-CM

## 2020-08-13 DIAGNOSIS — K912 Postsurgical malabsorption, not elsewhere classified: Secondary | ICD-10-CM | POA: Diagnosis not present

## 2020-08-17 ENCOUNTER — Other Ambulatory Visit: Payer: Self-pay | Admitting: Obstetrics and Gynecology

## 2020-08-17 DIAGNOSIS — R928 Other abnormal and inconclusive findings on diagnostic imaging of breast: Secondary | ICD-10-CM

## 2020-08-19 ENCOUNTER — Ambulatory Visit
Admission: RE | Admit: 2020-08-19 | Discharge: 2020-08-19 | Disposition: A | Discharge status: Home / Self Care | Payer: 59 | Source: Ambulatory Visit | Attending: Student | Admitting: Student

## 2020-08-19 ENCOUNTER — Other Ambulatory Visit: Payer: Self-pay | Admitting: Student

## 2020-08-19 DIAGNOSIS — K912 Postsurgical malabsorption, not elsewhere classified: Secondary | ICD-10-CM

## 2020-08-19 DIAGNOSIS — K219 Gastro-esophageal reflux disease without esophagitis: Secondary | ICD-10-CM | POA: Diagnosis not present

## 2020-08-23 ENCOUNTER — Ambulatory Visit (INDEPENDENT_AMBULATORY_CARE_PROVIDER_SITE_OTHER): Payer: 59 | Admitting: Internal Medicine

## 2020-08-23 ENCOUNTER — Encounter: Payer: Self-pay | Admitting: Internal Medicine

## 2020-08-23 ENCOUNTER — Other Ambulatory Visit: Payer: Self-pay

## 2020-08-23 ENCOUNTER — Other Ambulatory Visit (HOSPITAL_COMMUNITY): Payer: Self-pay

## 2020-08-23 VITALS — BP 114/66 | HR 70 | Temp 98.2°F | Resp 16 | Ht 69.0 in | Wt 266.6 lb

## 2020-08-23 DIAGNOSIS — J309 Allergic rhinitis, unspecified: Secondary | ICD-10-CM | POA: Diagnosis not present

## 2020-08-23 DIAGNOSIS — Z9884 Bariatric surgery status: Secondary | ICD-10-CM | POA: Insufficient documentation

## 2020-08-23 DIAGNOSIS — R739 Hyperglycemia, unspecified: Secondary | ICD-10-CM

## 2020-08-23 DIAGNOSIS — D539 Nutritional anemia, unspecified: Secondary | ICD-10-CM | POA: Insufficient documentation

## 2020-08-23 DIAGNOSIS — E559 Vitamin D deficiency, unspecified: Secondary | ICD-10-CM

## 2020-08-23 DIAGNOSIS — H1010 Acute atopic conjunctivitis, unspecified eye: Secondary | ICD-10-CM | POA: Diagnosis not present

## 2020-08-23 DIAGNOSIS — Z0000 Encounter for general adult medical examination without abnormal findings: Secondary | ICD-10-CM

## 2020-08-23 DIAGNOSIS — Z1211 Encounter for screening for malignant neoplasm of colon: Secondary | ICD-10-CM

## 2020-08-23 DIAGNOSIS — E785 Hyperlipidemia, unspecified: Secondary | ICD-10-CM | POA: Diagnosis not present

## 2020-08-23 DIAGNOSIS — Z0001 Encounter for general adult medical examination with abnormal findings: Secondary | ICD-10-CM | POA: Insufficient documentation

## 2020-08-23 HISTORY — DX: Acute atopic conjunctivitis, unspecified eye: H10.10

## 2020-08-23 HISTORY — DX: Allergic rhinitis, unspecified: J30.9

## 2020-08-23 LAB — CBC WITH DIFFERENTIAL/PLATELET
Basophils Absolute: 0 10*3/uL (ref 0.0–0.1)
Basophils Relative: 0.5 % (ref 0.0–3.0)
Eosinophils Absolute: 0.1 10*3/uL (ref 0.0–0.7)
Eosinophils Relative: 1.7 % (ref 0.0–5.0)
HCT: 38.3 % (ref 36.0–46.0)
Hemoglobin: 12.9 g/dL (ref 12.0–15.0)
Lymphocytes Relative: 36.3 % (ref 12.0–46.0)
Lymphs Abs: 2.9 10*3/uL (ref 0.7–4.0)
MCHC: 33.5 g/dL (ref 30.0–36.0)
MCV: 93.3 fl (ref 78.0–100.0)
Monocytes Absolute: 0.4 10*3/uL (ref 0.1–1.0)
Monocytes Relative: 5.4 % (ref 3.0–12.0)
Neutro Abs: 4.5 10*3/uL (ref 1.4–7.7)
Neutrophils Relative %: 56.1 % (ref 43.0–77.0)
Platelets: 343 10*3/uL (ref 150.0–400.0)
RBC: 4.11 Mil/uL (ref 3.87–5.11)
RDW: 13.7 % (ref 11.5–15.5)
WBC: 8 10*3/uL (ref 4.0–10.5)

## 2020-08-23 LAB — VITAMIN B12: Vitamin B-12: 597 pg/mL (ref 211–911)

## 2020-08-23 LAB — FOLATE: Folate: 6.2 ng/mL (ref >5.9)

## 2020-08-23 LAB — RETICULOCYTES
ABS Retic: 50160 cells/uL (ref 20000–8000)
Retic Ct Pct: 1.2 %

## 2020-08-23 LAB — VITAMIN D 25 HYDROXY (VIT D DEFICIENCY, FRACTURES): VITD: 21.99 ng/mL — ABNORMAL LOW (ref 30.00–100.00)

## 2020-08-23 LAB — FERRITIN: Ferritin: 25.4 ng/mL (ref 10.0–291.0)

## 2020-08-23 LAB — IRON: Iron: 89 ug/dL (ref 42–145)

## 2020-08-23 MED ORDER — METHYLPREDNISOLONE 4 MG PO TBPK
ORAL_TABLET | ORAL | 0 refills | Status: AC
  Filled 2020-08-23: qty 21, 6d supply, fill #0

## 2020-08-23 NOTE — Progress Notes (Signed)
Subjective:  Patient ID: Rebecca Russo, female    DOB: 1975/12/09  Age: 45 y.o. MRN: [id number]  CC: New Patient (Initial Visit) (Establish care/discuss allergies) and Anemia  This visit occurred during the SARS-CoV-2 public health emergency.  Safety protocols were in place, including screening questions prior to the visit, additional usage of staff PPE, and extensive cleaning of exam room while observing appropriate contact time as indicated for disinfecting solutions.    HPI Rebecca Russo presents for a CPX and to establish.  She complains of a several week history of sneezing, nasal congestion, postnasal drip with nonproductive cough, and runny nose.  She is not getting much symptom relief with Allegra and a steroid nasal spray.  She struggles with chronic, recurrent episodes of nausea and vomiting.  She is 2 years status post sleeve gastrectomy.  She had an upper GI series done about a week ago at the direction of her surgeon.  She has also had a partial colectomy 15 years ago.  She is status post cholecystectomy and C-section.  She previously had a CVA due to a patent foramen ovale which has been repaired.  She continues to have word finding difficulties.  History Rebecca Russo has a past medical history of Allergic rhinoconjunctivitis (08/23/2020), Aphasia complicating stroke, Bilateral swelling of feet, Chronic systolic heart failure (HCC) (09/62/8366), Constipation, Eczema, H/O varicella, Hemiplegia affecting dominant side (HCC), History of ischemic left MCA stroke, Increased BMI (12/17/08), LAE (left atrial enlargement) (04/05/2018), Migraines, Morbid obesity (HCC), PFO with atrial septal aneurysm (06/04/2014), Stroke (cerebrum) (HCC), and Vitamin D deficiency.   She has a past surgical history that includes Laparoscopic cholecystectomy (06/20/2012); Radiology with anesthesia (N/A, 05/29/2014); TEE without cardioversion (N/A, 06/03/2014); Patent foramen ovale closure (07/09/2014); patent foramen ovale  closure (N/A, 07/09/2014); Cesarean section (1999); Colon surgery (2004); and Laparoscopic gastric sleeve resection (N/A, 07/29/2018).   Her family history includes Cancer in an other family member; Diabetes in her father and mother; Heart disease in her father and mother; Hypertension in her father and mother; Sleep apnea in her mother; Stroke in her father, maternal grandfather, and paternal grandfather.She reports that she has never smoked. She has never used smokeless tobacco. She reports that she does not drink alcohol and does not use drugs.  Outpatient Medications Prior to Visit  Medication Sig Dispense Refill  . levonorgestrel (MIRENA, 52 MG,) 20 MCG/24HR IUD Mirena 20 mcg/24 hours (7 yrs) 52 mg intrauterine device  Take 1 device by intrauterine route.    . ondansetron (ZOFRAN) 4 MG tablet TAKE 1 TABLET BY MOUTH EVERY 4-6 HOURS AS NEEDED 30 tablet 2   No facility-administered medications prior to visit.    ROS Review of Systems  Constitutional: Negative for appetite change, chills, diaphoresis, fatigue and fever.  HENT: Positive for congestion, postnasal drip, rhinorrhea and sneezing. Negative for nosebleeds, sinus pressure, sinus pain, sore throat, trouble swallowing and voice change.   Respiratory: Positive for cough. Negative for chest tightness, shortness of breath and wheezing.   Gastrointestinal: Positive for nausea and vomiting. Negative for abdominal pain and constipation.  Endocrine: Negative.   Genitourinary: Negative.  Negative for difficulty urinating, dysuria and hematuria.  Musculoskeletal: Negative.   Skin: Negative.   Neurological: Negative.  Negative for dizziness, weakness, light-headedness and numbness.  Hematological: Negative for adenopathy. Does not bruise/bleed easily.  Psychiatric/Behavioral: Negative.     Objective:  BP 114/66 (BP Location: Left Arm, Patient Position: Sitting, Cuff Size: Large)   Pulse 70   Temp 98.2 F (36.8  C) (Oral)   Resp 16   Ht  5\' 9"  (1.753 m)   Wt 266 lb 9.6 oz (120.9 kg)   LMP 07/23/2020   SpO2 97%   BMI 39.37 kg/m   Physical Exam HENT:     Nose: Mucosal edema, congestion and rhinorrhea present. Rhinorrhea is clear.     Right Nostril: No epistaxis.     Left Nostril: No epistaxis.     Right Sinus: No maxillary sinus tenderness or frontal sinus tenderness.     Left Sinus: No maxillary sinus tenderness or frontal sinus tenderness.     Mouth/Throat:     Mouth: Mucous membranes are moist.  Eyes:     General: No scleral icterus.    Conjunctiva/sclera: Conjunctivae normal.  Cardiovascular:     Rate and Rhythm: Normal rate and regular rhythm.     Heart sounds: No murmur heard.   Pulmonary:     Effort: Pulmonary effort is normal.     Breath sounds: No stridor. No wheezing, rhonchi or rales.  Abdominal:     General: Abdomen is flat.     Palpations: There is no mass.     Tenderness: There is no abdominal tenderness. There is no guarding.     Hernia: No hernia is present.  Musculoskeletal:        General: Normal range of motion.     Cervical back: Neck supple.     Right lower leg: No edema.     Left lower leg: No edema.  Lymphadenopathy:     Cervical: No cervical adenopathy.  Skin:    General: Skin is warm and dry.     Coloration: Skin is not pale.  Neurological:     Mental Status: She is alert and oriented to person, place, and time. Mental status is at baseline.  Psychiatric:        Mood and Affect: Mood normal.        Behavior: Behavior normal.     Lab Results  Component Value Date   WBC 8.0 08/23/2020   HGB 12.9 08/23/2020   HCT 38.3 08/23/2020   PLT 343.0 08/23/2020   GLUCOSE 111 (H) 07/23/2018   CHOL 161 08/24/2020   TRIG 96.0 08/24/2020   HDL 55.00 08/24/2020   LDLCALC 87 08/24/2020   ALT 15 07/23/2018   AST 17 07/23/2018   NA 138 07/23/2018   K 4.0 07/23/2018   CL 110 07/23/2018   CREATININE 0.87 07/23/2018   BUN 9 07/23/2018   CO2 23 07/23/2018   TSH 2.24 08/24/2020   INR  1.1 (H) 07/06/2014   HGBA1C 5.4 08/24/2020    Assessment & Plan:   Rebecca Russo was seen today for new patient (initial visit) and anemia.  Diagnoses and all orders for this visit:  S/P laparoscopic sleeve gastrectomy-she has a history of anemia.  I will screen her for vitamin deficiencies. -     CBC with Differential/Platelet; Future -     Iron; Future -     Vitamin B12; Future -     Zinc; Future -     Vitamin B1; Future -     VITAMIN D 25 Hydroxy (Vit-D Deficiency, Fractures); Future -     Ferritin; Future -     Folate; Future -     Reticulocytes; Future -     Reticulocytes -     Folate -     Ferritin -     VITAMIN D 25 Hydroxy (Vit-D Deficiency, Fractures) -  Vitamin B1 -     Zinc -     Vitamin B12 -     Iron -     CBC with Differential/Platelet -     Cholecalciferol 125 MCG (5000 UT) capsule; Take 1 capsule (5,000 Units total) by mouth daily.  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 -     CBC with Differential/Platelet; Future -     Iron; Future -     Vitamin B12; Future -     Zinc; Future -     Vitamin B1; Future -     Ferritin; Future -     Folate; Future -     Reticulocytes; Future -     Reticulocytes -     Folate -     Ferritin -     Vitamin B1 -     Zinc -     Vitamin B12 -     Iron -     CBC with Differential/Platelet  Encounter for general adult medical examination with abnormal findings-exam completed, labs reviewed, vaccines reviewed and updated, cancer screenings addressed, patient education was given. -     Lipid panel; Future  Allergic rhinoconjunctivitis -     methylPREDNISolone (MEDROL DOSEPAK) 4 MG TBPK tablet; TAKE AS DIRECTED  Hyperlipidemia LDL goal <70-She has a very low ASCVD risk score so I did not recommend a statin for CV risk reduction. -     TSH; Future  Hyperglycemia- Her A1c is normal. -     Hemoglobin A1c; Future  Colon cancer screening -     Cologuard  Vitamin D deficiency -     Cholecalciferol 125 MCG (5000 UT) capsule;  Take 1 capsule (5,000 Units total) by mouth daily.   I am having Rebecca Russo start on methylPREDNISolone and Cholecalciferol. I am also having her maintain her Mirena (52 MG) and ondansetron.  Meds ordered this encounter  Medications  . methylPREDNISolone (MEDROL DOSEPAK) 4 MG TBPK tablet    Sig: TAKE AS DIRECTED    Dispense:  21 tablet    Refill:  0  . Cholecalciferol 125 MCG (5000 UT) capsule    Sig: Take 1 capsule (5,000 Units total) by mouth daily.    Dispense:  90 capsule    Refill:  1     Follow-up: Return in about 3 months (around 11/22/2020).  Sanda Linger, MD

## 2020-08-23 NOTE — Patient Instructions (Signed)
[employer] medicine (25th ed., pp. [phone]). [place], [place]: Elsevier.">  Anemia  Anemia is a condition in which there is not enough red blood cells or hemoglobin in the blood. Hemoglobin is a substance in red blood cells that carries oxygen. When you do not have enough red blood cells or hemoglobin (are anemic), your body cannot get enough oxygen and your organs may not work properly. As a result, you may feel very tired or have other problems. What are the causes? Common causes of anemia include:  Excessive bleeding. Anemia can be caused by excessive bleeding inside or outside the body, including bleeding from the intestines or from heavy menstrual periods in females.  Poor nutrition.  Long-lasting (chronic) kidney, thyroid, and liver disease.  Bone marrow disorders, spleen problems, and blood disorders.  Cancer and treatments for cancer.  HIV (human immunodeficiency virus) and AIDS (acquired immunodeficiency syndrome).  Infections, medicines, and autoimmune disorders that destroy red blood cells. What are the signs or symptoms? Symptoms of this condition include:  Minor weakness.  Dizziness.  Headache, or difficulties concentrating and sleeping.  Heartbeats that feel irregular or faster than normal (palpitations).  Shortness of breath, especially with exercise.  Pale skin, lips, and nails, or cold hands and feet.  Indigestion and nausea. Symptoms may occur suddenly or develop slowly. If your anemia is mild, you may not have symptoms. How is this diagnosed? This condition is diagnosed based on blood tests, your medical history, and a physical exam. In some cases, a test may be needed in which cells are removed from the soft tissue inside of a bone and looked at under a microscope (bone marrow biopsy). Your health care provider may also check your stool (feces) for blood and may do additional testing to look for the cause of your bleeding. Other tests may  include:  Imaging tests, such as a CT scan or MRI.  A procedure to see inside your esophagus and stomach (endoscopy).  A procedure to see inside your colon and rectum (colonoscopy). How is this treated? Treatment for this condition depends on the cause. If you continue to lose a lot of blood, you may need to be treated at a hospital. Treatment may include:  Taking supplements of iron, vitamin Q68, or folic acid.  Taking a hormone medicine (erythropoietin) that can help to stimulate red blood cell growth.  Having a blood transfusion. This may be needed if you lose a lot of blood.  Making changes to your diet.  Having surgery to remove your spleen. Follow these instructions at home:  Take over-the-counter and prescription medicines only as told by your health care provider.  Take supplements only as told by your health care provider.  Follow any diet instructions that you were given by your health care provider.  Keep all follow-up visits as told by your health care provider. This is important. Contact a health care provider if:  You develop new bleeding anywhere in the body. Get help right away if:  You are very weak.  You are short of breath.  You have pain in your abdomen or chest.  You are dizzy or feel faint.  You have trouble concentrating.  You have bloody stools, black stools, or tarry stools.  You vomit repeatedly or you vomit up blood. These symptoms may represent a serious problem that is an emergency. Do not wait to see if the symptoms will go away. Get medical help right away. Call your local emergency services (911 in the [place] Do not  drive yourself to the hospital. Summary  Anemia is a condition in which you do not have enough red blood cells or enough of a substance in your red blood cells that carries oxygen (hemoglobin).  Symptoms may occur suddenly or develop slowly.  If your anemia is mild, you may not have symptoms.  This condition is  diagnosed with blood tests, a medical history, and a physical exam. Other tests may be needed.  Treatment for this condition depends on the cause of the anemia. This information is not intended to replace advice given to you by your health care provider. Make sure you discuss any questions you have with your health care provider. Document Revised: 04/08/2019 Document Reviewed: 04/08/2019 Elsevier Patient Education  2021 [employer]vier Inc.  

## 2020-08-24 ENCOUNTER — Other Ambulatory Visit: Payer: 59

## 2020-08-24 ENCOUNTER — Other Ambulatory Visit (INDEPENDENT_AMBULATORY_CARE_PROVIDER_SITE_OTHER): Payer: 59

## 2020-08-24 ENCOUNTER — Other Ambulatory Visit (HOSPITAL_COMMUNITY): Payer: Self-pay

## 2020-08-24 DIAGNOSIS — Z0001 Encounter for general adult medical examination with abnormal findings: Secondary | ICD-10-CM

## 2020-08-24 DIAGNOSIS — E785 Hyperlipidemia, unspecified: Secondary | ICD-10-CM | POA: Diagnosis not present

## 2020-08-24 DIAGNOSIS — Z1211 Encounter for screening for malignant neoplasm of colon: Secondary | ICD-10-CM | POA: Insufficient documentation

## 2020-08-24 DIAGNOSIS — R739 Hyperglycemia, unspecified: Secondary | ICD-10-CM | POA: Insufficient documentation

## 2020-08-24 DIAGNOSIS — Z7689 Persons encountering health services in other specified circumstances: Secondary | ICD-10-CM | POA: Insufficient documentation

## 2020-08-24 DIAGNOSIS — E559 Vitamin D deficiency, unspecified: Secondary | ICD-10-CM | POA: Insufficient documentation

## 2020-08-24 LAB — TSH: TSH: 2.24 u[IU]/mL (ref 0.35–4.50)

## 2020-08-24 LAB — LIPID PANEL
Cholesterol: 161 mg/dL (ref 0–200)
HDL: 55 mg/dL (ref >39.00)
LDL Cholesterol: 87 mg/dL (ref 0–99)
NonHDL: 106.11
Total CHOL/HDL Ratio: 3
Triglycerides: 96 mg/dL (ref 0.0–149.0)
VLDL: 19.2 mg/dL (ref 0.0–40.0)

## 2020-08-24 LAB — HEMOGLOBIN A1C: Hgb A1c MFr Bld: 5.4 % (ref 4.6–6.5)

## 2020-08-24 MED ORDER — CHOLECALCIFEROL 125 MCG (5000 UT) PO CAPS
5000.0000 [IU] | ORAL_CAPSULE | Freq: Every day | ORAL | 1 refills | Status: DC
  Filled 2020-08-24: qty 90, 90d supply, fill #0

## 2020-08-25 ENCOUNTER — Other Ambulatory Visit (HOSPITAL_COMMUNITY): Payer: Self-pay

## 2020-08-25 DIAGNOSIS — K449 Diaphragmatic hernia without obstruction or gangrene: Secondary | ICD-10-CM | POA: Diagnosis not present

## 2020-08-25 DIAGNOSIS — Z9884 Bariatric surgery status: Secondary | ICD-10-CM | POA: Diagnosis not present

## 2020-08-27 ENCOUNTER — Other Ambulatory Visit: Payer: Self-pay | Admitting: Internal Medicine

## 2020-08-27 ENCOUNTER — Other Ambulatory Visit (HOSPITAL_COMMUNITY): Payer: Self-pay

## 2020-08-27 DIAGNOSIS — E519 Thiamine deficiency, unspecified: Secondary | ICD-10-CM

## 2020-08-27 LAB — ZINC: Zinc: 78 ug/dL (ref 60–130)

## 2020-08-27 LAB — VITAMIN B1: Vitamin B1 (Thiamine): <6 nmol/L — ABNORMAL LOW (ref 8–30)

## 2020-08-27 MED ORDER — VITAMIN B-1 50 MG PO TABS
50.0000 mg | ORAL_TABLET | Freq: Every day | ORAL | 1 refills | Status: DC
  Filled 2020-08-27: qty 90, 90d supply, fill #0

## 2020-08-28 ENCOUNTER — Other Ambulatory Visit: Payer: Self-pay | Admitting: Internal Medicine

## 2020-09-03 ENCOUNTER — Other Ambulatory Visit: Payer: Self-pay | Admitting: Obstetrics and Gynecology

## 2020-09-03 ENCOUNTER — Ambulatory Visit
Admission: RE | Admit: 2020-09-03 | Discharge: 2020-09-03 | Disposition: A | Discharge status: Home / Self Care | Payer: 59 | Source: Ambulatory Visit | Attending: Obstetrics and Gynecology | Admitting: Obstetrics and Gynecology

## 2020-09-03 ENCOUNTER — Other Ambulatory Visit: Payer: Self-pay

## 2020-09-03 DIAGNOSIS — R928 Other abnormal and inconclusive findings on diagnostic imaging of breast: Secondary | ICD-10-CM

## 2020-09-03 DIAGNOSIS — N6489 Other specified disorders of breast: Secondary | ICD-10-CM

## 2020-10-21 ENCOUNTER — Telehealth: Payer: Self-pay | Admitting: Cardiovascular Disease

## 2020-10-21 NOTE — Telephone Encounter (Signed)
Patient is requesting a referral to a neurologist. She states she does have a specific neurologist she would like to be referred to, but the call dropped before I could get the name. Unable to contact patient. Please return call to patient to discuss referral when able.

## 2020-10-21 NOTE — Telephone Encounter (Signed)
Patient called back. She would like to see Dr. Patrcia Dolly at [facility]

## 2020-10-22 ENCOUNTER — Telehealth: Payer: Self-pay | Admitting: Internal Medicine

## 2020-10-22 ENCOUNTER — Other Ambulatory Visit: Payer: Self-pay | Admitting: Internal Medicine

## 2020-10-22 DIAGNOSIS — G8190 Hemiplegia, unspecified affecting unspecified side: Secondary | ICD-10-CM

## 2020-10-22 DIAGNOSIS — I63412 Cerebral infarction due to embolism of left middle cerebral artery: Secondary | ICD-10-CM

## 2020-10-22 NOTE — Telephone Encounter (Signed)
The patient was a patient of Dr. [name] who is no longer practicing outpatient. Informed her she can call Dr. Warren Danes office and likely get established with another provider or she can call her PCP for a referral for insurance purposes.  She understands to call back if she has any issues.

## 2020-10-22 NOTE — Telephone Encounter (Signed)
Patient is requesting a referral to neurologist Dr. Patrcia Dolly at [facility]. She was last seen 08-23-20. Please advise

## 2020-10-28 ENCOUNTER — Encounter: Payer: 59 | Attending: Surgery | Admitting: Skilled Nursing Facility1

## 2020-10-28 ENCOUNTER — Other Ambulatory Visit: Payer: Self-pay

## 2020-10-28 ENCOUNTER — Encounter: Payer: Self-pay | Admitting: Skilled Nursing Facility1

## 2020-10-28 DIAGNOSIS — E6609 Other obesity due to excess calories: Secondary | ICD-10-CM | POA: Diagnosis not present

## 2020-10-28 NOTE — Progress Notes (Signed)
Nutrition Assessment for Bariatric Surgery Medical Nutrition Therapy Appt Start Time: 8:10  End Time: 9:05  Patient was seen on 10/28/2020 for Pre-Operative Nutrition Assessment. Letter of approval faxed to [facility] bariatric surgery program coordinator on 10/28/2020.   Referral stated Supervised Weight Loss (SWL) visits needed: 0  Pt completed visits.   Pt has cleared nutrition requirements.   Planned surgery: sleeve - RYGB Pt expectation of surgery: to correct reflux Pt expectation of dietitian: none stated    NUTRITION ASSESSMENT   Anthropometrics  Start weight at [facility]: 273 lbs (date: 10/28/2020)  Height: 70 in BMI: 39.17 kg/m2     Clinical  Medical hx: Chronic systolic heart failure, CVA, IBS, hyperlipidemia, anemia Medications: see list  Labs: B1 6, B12 597, A1C 5.4, zinc 78, Vitamin D 21.99, Iron 89 Notable signs/symptoms: dysphagia, regurgitation, low energy  Any previous deficiencies? No  Micronutrient Nutrition Focused Physical Exam: Hair: No issues observed Eyes: No issues observed Mouth: No issues observed Neck: No issues observed Nails: No issues observed Skin: No issues observed  Lifestyle & Dietary Hx  Pt arrives with pain and dysphagia.   Pt states she juices but not everyday.   24-Hr Dietary Recall First Meal: premier protein + sugar decaf coffee Snack: applesauce  Second Meal: salmon + broccoli or asparagus Snack: strawberry or pineapple Third Meal: salmon + broccoli Snack:  Beverages: alkaline water sometimes with flavoring   Estimated Energy Needs Calories: 1300   NUTRITION DIAGNOSIS  Overweight/obesity ([place]-3.3) related to past poor dietary habits and physical inactivity as evidenced by patient w/ planned sleeve to RYGB surgery following dietary guidelines for continued weight loss.    NUTRITION INTERVENTION  Nutrition counseling (C-1) and education (E-2) to facilitate bariatric surgery goals.  Educated pt on  micronutrient deficiencies post surgery and strategies to mitigate that risk   Pre-Op Goals Reviewed with the Patient Track food and beverage intake (pen and paper, MyFitness Pal, Baritastic app, etc.) Make healthy food choices while monitoring portion sizes Consume 3 meals per day or try to eat every 3-5 hours Avoid concentrated sugars and fried foods Keep sugar & fat in the single digits per serving on food labels Practice CHEWING your food (aim for applesauce consistency) Practice not drinking 15 minutes before, during, and 30 minutes after each meal and snack Avoid all carbonated beverages (ex: soda, sparkling beverages)  Limit caffeinated beverages (ex: coffee, tea, energy drinks) Avoid all sugar-sweetened beverages (ex: regular soda, sports drinks)  Avoid alcohol  Aim for 64-100 ounces of FLUID daily (with at least half of fluid intake being plain water)  Aim for at least 60-80 grams of PROTEIN daily Look for a liquid protein source that contains ?15 g protein and ?5 g carbohydrate (ex: shakes, drinks, shots) Make a list of non-food related activities Physical activity is an important part of a healthy lifestyle so keep it moving! The goal is to reach 150 minutes of exercise per week, including cardiovascular and weight baring activity.  *Goals that are bolded indicate the pt would like to start working towards these  Handouts Provided Include  Bariatric Surgery handouts (Nutrition Visits, Pre-Op Goals, Protein Shakes, Vitamins & Minerals)  Learning Style & Readiness for Change Teaching method utilized: Visual & Auditory  Demonstrated degree of understanding via: Teach Back  Readiness Level: Action Barriers to learning/adherence to lifestyle change: dysphagia      MONITORING & EVALUATION Dietary intake, weekly physical activity, body weight, and pre-op goals reached at next nutrition visit.  Next Steps  Patient is to follow up at [facility] for Pre-Op Class >2 weeks before  surgery for further nutrition education.  Pt has completed visits. No further supervised visits required/recomended

## 2020-11-30 ENCOUNTER — Ambulatory Visit: Payer: Self-pay | Admitting: Surgery

## 2020-11-30 NOTE — Progress Notes (Signed)
Sent message, via epic in basket, requesting orders in epic from surgeon.  

## 2020-12-06 ENCOUNTER — Encounter: Payer: 59 | Attending: Surgery | Admitting: Skilled Nursing Facility1

## 2020-12-06 ENCOUNTER — Other Ambulatory Visit: Payer: Self-pay

## 2020-12-06 DIAGNOSIS — Z6841 Body Mass Index (BMI) 40.0 and over, adult: Secondary | ICD-10-CM | POA: Diagnosis not present

## 2020-12-06 NOTE — Progress Notes (Signed)
Pre-Operative Nutrition Class:    Patient was seen on 07/ 25/2022for Pre-Operative Bariatric Surgery Education at [facility].    Surgery date: 12/21/2020 Surgery type: sleeve to RYGB Start weight at [facility]: 273 pounds Weight today: 273.9 pounds  Samples given per MNT protocol. Patient educated on appropriate usage: Protien 20 shake Lot: [id number] Exp: 09/24/21   Bariatric Advantage Calcium Lot # [id number] Exp: 02/23     procare Vitamins Calcium Lot # [id number] Exp: 01/23   Celebrate Protein Powder   Lot # [id number] Exp: 04/23  The following the learning objectives were met by the patient during this course: Identify Pre-Op Dietary Goals and will begin 2 weeks pre-operatively Identify appropriate sources of fluids and proteins  State protein recommendations and appropriate sources pre and post-operatively Identify Post-Operative Dietary Goals and will follow for 2 weeks post-operatively Identify appropriate multivitamin and calcium sources Describe the need for physical activity post-operatively and will follow MD recommendations State when to call healthcare provider regarding medication questions or post-operative complications When having a diagnosis of diabetes understanding hypoglycemia symptoms and the inclusion of 1 complex carbohydrate per meal  Handouts given during class include: Pre-Op Bariatric Surgery Diet Handout Protein Shake Handout Post-Op Bariatric Surgery Nutrition Handout BELT Program Information Flyer Support Group Information Flyer [facility] Bariatric Supplements Price List  Follow-Up Plan: Patient will follow-up at [facility] 2 weeks post operatively for diet advancement per MD.

## 2020-12-09 NOTE — Patient Instructions (Addendum)
DUE TO COVID-19 ONLY ONE VISITOR IS ALLOWED TO COME WITH YOU AND STAY IN THE WAITING ROOM ONLY DURING PRE OP AND PROCEDURE.   **NO VISITORS ARE ALLOWED IN THE SHORT STAY AREA OR RECOVERY ROOM!!**  IF YOU WILL BE ADMITTED INTO THE HOSPITAL YOU ARE ALLOWED ONLY TWO SUPPORT PEOPLE DURING VISITATION HOURS ONLY (10AM -8PM)   The support person(s) may change daily. The support person(s) must pass our screening, gel in and out, and wear a mask at all times, including in the patient's room. Patients must also wear a mask when staff or their support person are in the room.  No visitors under the age of 6. Any visitor under the age of 22 must be accompanied by an adult.    COVID SWAB TESTING MUST BE COMPLETED ON:  12/17/20   [address] [place] (backside of the building) open 8am-3pm. No appointment needed. You are not required to quarantine, however you are required to wear a well-fitted mask when you are out and around people not in your household.  Hand Hygiene often Do NOT share personal items Notify your provider if you are in close contact with someone who has COVID or you develop fever 100.4 or greater, new onset of sneezing, cough, sore throat, shortness of breath or body aches.       Your procedure is scheduled on: 12/21/20   Report to [facility] Main  Entrance   Report to Short Stay at 5:15 AM   [facility])   Call this number if you have problems the morning of surgery [phone]   Do not eat food  after 6pm day before surgery   May have liquids until  4:15 AM  day of surgery  CLEAR LIQUID DIET  Foods Allowed                                                                     Foods Excluded  Water, Black Coffee and tea, regular and decaf               liquids that you cannot  Plain Jell-O in any flavor  (No red)                                    see through such as: Fruit ices (not with fruit pulp)                                            milk, soups,  orange juice              Iced Popsicles (No red)                                                All solid food  Apple juices Sports drinks like Gatorade (No red) Lightly seasoned clear broth or consume(fat free) Sugar, honey syrup  Oral Hygiene is also important to reduce your risk of infection.                                    Remember - BRUSH YOUR TEETH THE MORNING OF SURGERY WITH YOUR REGULAR TOOTHPASTE   Take these medicines the morning of surgery with A SIP OF WATER: None             You may not have any metal on your body including hair pins, jewelry, and body piercing             Do not wear make-up, lotions, powders, perfumes, or deodorant  Do not wear nail polish including gel and S&S, artificial/acrylic nails, or any other type of covering on natural nails including finger and toenails. If you have artificial nails, gel coating, etc. that needs to be removed by a nail salon please have this removed prior to surgery or surgery may need to be canceled/ delayed if the surgeon/ anesthesia feels like they are unable to be safely monitored.   Do not shave  48 hours prior to surgery.    Do not bring valuables to the hospital. [facility] IS NOT             RESPONSIBLE   FOR VALUABLES.   Contacts, dentures or bridgework may not be worn into surgery.   Bring small overnight bag day of surgery.  Please read over the following fact sheets you were given: IF YOU HAVE QUESTIONS ABOUT YOUR PRE OP INSTRUCTIONS PLEASE CALL [phone]- [facility] - Preparing for Surgery Before surgery, you can play an important role.  Because skin is not sterile, your skin needs to be as free of germs as possible.  You can reduce the number of germs on your skin by washing with CHG (chlorahexidine gluconate) soap before surgery.  CHG is an antiseptic cleaner which kills germs and bonds with the skin to continue killing germs even after washing. Please DO NOT use  if you have an allergy to CHG or antibacterial soaps.  If your skin becomes reddened/irritated stop using the CHG and inform your nurse when you arrive at Short Stay. Do not shave (including legs and underarms) for at least 48 hours prior to the first CHG shower.  You may shave your face/neck.  Please follow these instructions carefully:  1.  Shower with CHG Soap the night before surgery and the  morning of surgery.  2.  If you choose to wash your hair, wash your hair first as usual with your normal  shampoo.  3.  After you shampoo, rinse your hair and body thoroughly to remove the shampoo.                             4.  Use CHG as you would any other liquid soap.  You can apply chg directly to the skin and wash.  Gently with a scrungie or clean washcloth.  5.  Apply the CHG Soap to your body ONLY FROM THE NECK DOWN.   Do   not use on face/ open  Wound or open sores. Avoid contact with eyes, ears mouth and   genitals (private parts).                       Wash face,  Genitals (private parts) with your normal soap.             6.  Wash thoroughly, paying special attention to the area where your    surgery  will be performed.  7.  Thoroughly rinse your body with warm water from the neck down.  8.  DO NOT shower/wash with your normal soap after using and rinsing off the CHG Soap.                9.  Pat yourself dry with a clean towel.            10.  Wear clean pajamas.            11.  Place clean sheets on your bed the night of your first shower and do not  sleep with pets. Day of Surgery : Do not apply any lotions/deodorants the morning of surgery.  Please wear clean clothes to the hospital/surgery center.  FAILURE TO FOLLOW THESE INSTRUCTIONS MAY RESULT IN THE CANCELLATION OF YOUR SURGERY  PATIENT SIGNATURE_________________________________  NURSE SIGNATURE__________________________________  ________________________________________________________________________   WHAT IS A BLOOD TRANSFUSION? Blood Transfusion Information  A transfusion is the replacement of blood or some of its parts. Blood is made up of multiple cells which provide different functions. Red blood cells carry oxygen and are used for blood loss replacement. White blood cells fight against infection. Platelets control bleeding. Plasma helps clot blood. Other blood products are available for specialized needs, such as hemophilia or other clotting disorders. BEFORE THE TRANSFUSION  Who gives blood for transfusions?  Healthy volunteers who are fully evaluated to make sure their blood is safe. This is blood bank blood. Transfusion therapy is the safest it has ever been in the practice of medicine. Before blood is taken from a donor, a complete history is taken to make sure that person has no history of diseases nor engages in risky social behavior (examples are intravenous drug use or sexual activity with multiple partners). The donor's travel history is screened to minimize risk of transmitting infections, such as malaria. The donated blood is tested for signs of infectious diseases, such as HIV and hepatitis. The blood is then tested to be sure it is compatible with you in order to minimize the chance of a transfusion reaction. If you or a relative donates blood, this is often done in anticipation of surgery and is not appropriate for emergency situations. It takes many days to process the donated blood. RISKS AND COMPLICATIONS Although transfusion therapy is very safe and saves many lives, the main dangers of transfusion include:  Getting an infectious disease. Developing a transfusion reaction. This is an allergic reaction to something in the blood you were given. Every precaution is taken to prevent this. The decision to have a blood transfusion has been considered carefully by your caregiver before blood is given. Blood is not given unless the benefits outweigh the risks. AFTER THE  TRANSFUSION Right after receiving a blood transfusion, you will usually feel much better and more energetic. This is especially true if your red blood cells have gotten low (anemic). The transfusion raises the level of the red blood cells which carry oxygen, and this usually causes an energy increase. The nurse administering the transfusion  will monitor you carefully for complications. HOME CARE INSTRUCTIONS  No special instructions are needed after a transfusion. You may find your energy is better. Speak with your caregiver about any limitations on activity for underlying diseases you may have. SEEK MEDICAL CARE IF:  Your condition is not improving after your transfusion. You develop redness or irritation at the intravenous (IV) site. SEEK IMMEDIATE MEDICAL CARE IF:  Any of the following symptoms occur over the next 12 hours: Shaking chills. You have a temperature by mouth above 102 F (38.9 C), not controlled by medicine. Chest, back, or muscle pain. People around you feel you are not acting correctly or are confused. Shortness of breath or difficulty breathing. Dizziness and fainting. You get a rash or develop hives. You have a decrease in urine output. Your urine turns a dark color or changes to pink, red, or brown. Any of the following symptoms occur over the next 10 days: You have a temperature by mouth above 102 F (38.9 C), not controlled by medicine. Shortness of breath. Weakness after normal activity. The white part of the eye turns yellow (jaundice). You have a decrease in the amount of urine or are urinating less often. Your urine turns a dark color or changes to pink, red, or brown. Document Released: 04/28/2000 Document Revised: 07/24/2011 Document Reviewed: 12/16/2007 [facility] Patient Information 2014 [place], [place].  _______________________________________________________________________

## 2020-12-09 NOTE — Progress Notes (Addendum)
COVID Vaccine Completed: yes x3 Date COVID Vaccine completed: 07/24/19, 08/15/19 Has received booster: 03/12/20 COVID vaccine manufacturer: Pfizer     Date of COVID positive in last 90 days: No  PCP - Sanda Linger, MD Cardiologist - Tonny Bollman, MD  Chest x-ray - N/a EKG - 05/24/20 Epic  Stress Test - N/a ECHO - 06/11/20 Epic Cardiac Cath - N/a Pacemaker/ICD device last checked: N/a Spinal Cord Stimulator:N/a  Sleep Study - N/a CPAP -   Fasting Blood Sugar - N/a Checks Blood Sugar _____ times a day  Blood Thinner Instructions: N/a Aspirin Instructions: Last Dose:  Activity level: Can go up a flight of stairs and perform activities of daily living without stopping and without symptoms of chest pain or shortness of breath.    Anesthesia review: patent foramen ovale, CVA with hemiplegia, Dysphagia, HF  Patient denies shortness of breath, fever, cough and chest pain at PAT appointment   Patient verbalized understanding of instructions that were given to them at the PAT appointment. Patient was also instructed that they will need to review over the PAT instructions again at home before surgery.

## 2020-12-10 ENCOUNTER — Other Ambulatory Visit (HOSPITAL_COMMUNITY): Payer: Self-pay

## 2020-12-10 ENCOUNTER — Encounter (HOSPITAL_COMMUNITY): Payer: Self-pay

## 2020-12-10 ENCOUNTER — Encounter (HOSPITAL_COMMUNITY)
Admission: RE | Admit: 2020-12-10 | Discharge: 2020-12-10 | Disposition: A | Discharge status: Home / Self Care | Payer: 59 | Source: Ambulatory Visit | Attending: Surgery | Admitting: Surgery

## 2020-12-10 ENCOUNTER — Other Ambulatory Visit: Payer: Self-pay

## 2020-12-10 DIAGNOSIS — Z01812 Encounter for preprocedural laboratory examination: Secondary | ICD-10-CM | POA: Insufficient documentation

## 2020-12-10 HISTORY — DX: Depression, unspecified: F32.A

## 2020-12-10 HISTORY — DX: Anemia, unspecified: D64.9

## 2020-12-10 HISTORY — DX: Gastro-esophageal reflux disease without esophagitis: K21.9

## 2020-12-10 LAB — COMPREHENSIVE METABOLIC PANEL
ALT: 13 U/L (ref 0–44)
AST: 20 U/L (ref 15–41)
Albumin: 3.9 g/dL (ref 3.5–5.0)
Alkaline Phosphatase: 59 U/L (ref 38–126)
Anion gap: 7 (ref 5–15)
BUN: 13 mg/dL (ref 6–20)
CO2: 24 mmol/L (ref 22–32)
Calcium: 9.1 mg/dL (ref 8.9–10.3)
Chloride: 107 mmol/L (ref 98–111)
Creatinine, Ser: 0.65 mg/dL (ref 0.44–1.00)
GFR, Estimated: >60 mL/min (ref >60)
Glucose, Bld: 75 mg/dL (ref 70–99)
Potassium: 4 mmol/L (ref 3.5–5.1)
Sodium: 138 mmol/L (ref 135–145)
Total Bilirubin: 0.5 mg/dL (ref 0.3–1.2)
Total Protein: 7.4 g/dL (ref 6.5–8.1)

## 2020-12-10 LAB — CBC WITH DIFFERENTIAL/PLATELET
Abs Immature Granulocytes: 0.04 10*3/uL (ref 0.00–0.07)
Basophils Absolute: 0.1 10*3/uL (ref 0.0–0.1)
Basophils Relative: 1 %
Eosinophils Absolute: 0.1 10*3/uL (ref 0.0–0.5)
Eosinophils Relative: 1 %
HCT: 37.8 % (ref 36.0–46.0)
Hemoglobin: 12.5 g/dL (ref 12.0–15.0)
Immature Granulocytes: 0 %
Lymphocytes Relative: 35 %
Lymphs Abs: 3.6 10*3/uL (ref 0.7–4.0)
MCH: 32 pg (ref 26.0–34.0)
MCHC: 33.1 g/dL (ref 30.0–36.0)
MCV: 96.7 fL (ref 80.0–100.0)
Monocytes Absolute: 0.5 10*3/uL (ref 0.1–1.0)
Monocytes Relative: 5 %
Neutro Abs: 6 10*3/uL (ref 1.7–7.7)
Neutrophils Relative %: 58 %
Platelets: 381 10*3/uL (ref 150–400)
RBC: 3.91 MIL/uL (ref 3.87–5.11)
RDW: 12.6 % (ref 11.5–15.5)
WBC: 10.4 10*3/uL (ref 4.0–10.5)
nRBC: 0 % (ref 0.0–0.2)

## 2020-12-10 LAB — TYPE AND SCREEN
ABO/RH(D): B POS
Antibody Screen: NEGATIVE

## 2020-12-10 NOTE — Anesthesia Preprocedure Evaluation (Addendum)
Anesthesia Evaluation  Patient identified by MRN, date of birth, ID band Patient awake    Reviewed: Allergy & Precautions, NPO status , Patient's Chart, lab work & pertinent test results  Airway Mallampati: III  TM Distance: >3 FB Neck ROM: Full    Dental  (+) Dental Advisory Given   Pulmonary neg pulmonary ROS,    breath sounds clear to auscultation       Cardiovascular +CHF   Rhythm:Regular Rate:Normal     Neuro/Psych  Headaches, CVA    GI/Hepatic Neg liver ROS, GERD  ,  Endo/Other  Morbid obesity  Renal/GU negative Renal ROS     Musculoskeletal   Abdominal   Peds  Hematology  (+) anemia ,   Anesthesia Other Findings   Reproductive/Obstetrics                            Lab Results  Component Value Date   WBC 10.4 12/10/2020   HGB 12.5 12/10/2020   HCT 37.8 12/10/2020   MCV 96.7 12/10/2020   PLT 381 12/10/2020   Lab Results  Component Value Date   CREATININE 0.65 12/10/2020   BUN 13 12/10/2020   NA 138 12/10/2020   K 4.0 12/10/2020   CL 107 12/10/2020   CO2 24 12/10/2020   Echo 06/11/2020 1. Left ventricular ejection fraction, by estimation, is 60 to 65%. The left ventricle has normal function. The left ventricle has no regional wall motion abnormalities. Left ventricular diastolic parameters were normal.  2. Right ventricular systolic function is normal. The right ventricular size is normal. There is normal pulmonary artery systolic pressure.  3. PFO s/p 82mm Amplatzer cribriform closure device 06/04/14. No residual shunt.  4. The mitral valve is normal in structure. Trivial mitral valve regurgitation. No evidence of mitral stenosis.  5. The aortic valve is normal in structure. Aortic valve regurgitation is not visualized. No aortic stenosis is present.  6. The inferior vena cava is normal in size with greater than 50% respiratory variability, suggesting right atrial pressure  of 3 mmHg.  Anesthesia Physical Anesthesia Plan  ASA: 3  Anesthesia Plan: General   Post-op Pain Management:    Induction: Intravenous  PONV Risk Score and Plan: 4 or greater and Dexamethasone, Ondansetron, Scopolamine patch - Pre-op, Midazolam, Treatment may vary due to age or medical condition and Aprepitant  Airway Management Planned: Oral ETT  Additional Equipment:   Intra-op Plan:   Post-operative Plan: Extubation in OR  Informed Consent: I have reviewed the patients History and Physical, chart, labs and discussed the procedure including the risks, benefits and alternatives for the proposed anesthesia with the patient or authorized representative who has indicated his/her understanding and acceptance.     Dental advisory given  Plan Discussed with: CRNA  Anesthesia Plan Comments:       Anesthesia Quick Evaluation

## 2020-12-10 NOTE — Progress Notes (Signed)
Anesthesia Chart Review   Case: [id number] Date/Time: 12/21/20 0700   Procedures:      ROBOTIC SLEEVE GASTRECTOMY CONVESION TO ROBOTIC GASTRIC BYPASS RNY WITH UPPER GASTROINTESTINAL EDNOSCOPY     HERNIA REPAIR HIATAL   Anesthesia type: General   Pre-op diagnosis: MORBID OBESITY   Location: [facility] ROOM 02 / [facility]   Surgeons: Quentin Ore, MD       DISCUSSION:45 y.o. 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 Gwenevere Ghazi.   Pt last seen by cardiology 05/24/2020. Per OV note stable at this visit.  Repeat echo ordered, completed 06/11/2020 with normal LVEF, PFO occluder in normal position.   VS: BP 129/79   Pulse (!) 57   Temp 36.9 C (Oral)   Resp 14   Ht 5\' 10"  (1.778 m)   Wt 123.9 kg   SpO2 100%   BMI 39.20 kg/m   PROVIDERS: , MD is PCP   Etta Grandchild, MD is Cardiologist  LABS: Labs reviewed: Acceptable for surgery. (all labs ordered are listed, but only abnormal results are displayed)  Labs Reviewed  CBC WITH DIFFERENTIAL/PLATELET  COMPREHENSIVE METABOLIC PANEL  TYPE AND SCREEN     IMAGES:   EKG: 05/24/2020 Rate 61 bpm    CV: Echo 06/11/2020 1. Left ventricular ejection fraction, by estimation, is 60 to 65%. The  left ventricle has normal function. The left ventricle has no regional  wall motion abnormalities. Left ventricular diastolic parameters were  normal.   2. Right ventricular systolic function is normal. The right ventricular  size is normal. There is normal pulmonary artery systolic pressure.   3. PFO s/p 56mm Amplatzer cribriform closure device 06/04/14. No residual  shunt.   4. The mitral valve is normal in structure. Trivial mitral valve  regurgitation. No evidence of mitral stenosis.   5. The aortic valve is normal in structure. Aortic valve regurgitation is  not visualized. No aortic stenosis is present.   6. The  inferior vena cava is normal in size with greater than 50%  respiratory variability, suggesting right atrial pressure of 3 mmHg.  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   Anemia    Aphasia complicating stroke    Bilateral swelling of feet    Chronic systolic heart failure (HCC) 06/09/2016   a - Prob nonischemic //  Echo 10/17: EF 40-45, diffuse HK, mild LAE, PFO closure device without residual shunt  //  Could not tol cMRI // Echo 4/18: EF 55, normal wall motion, normal diastolic function, trivial MR, PFO device in place without residual shunt, PASP 24   Constipation    Depression    Eczema    GERD (gastroesophageal reflux disease)    H/O varicella    Hemiplegia affecting dominant side (HCC)    History of ischemic left MCA stroke    weakness on right side   Increased BMI 12/17/2008   LAE (left atrial enlargement) 04/05/2018   Mod, noted on ECHO   Migraines    Morbid obesity (HCC)    PFO with atrial septal aneurysm 06/04/2014   a - s/p transcatheter closure in 2/16 with 25 mm Amplatzer Cribiform ASO device // b - Echo 10/17:  EF 40-45, diffuse HK, mild LAE, PFO closure device without residual shunt    Stroke (cerebrum) (HCC)    Vitamin D deficiency     Past Surgical  History:  Procedure Laterality Date   CESAREAN SECTION  1999   COLON SURGERY  2004   partial colectomy   LAPAROSCOPIC CHOLECYSTECTOMY  06/20/2012   LAPAROSCOPIC GASTRIC SLEEVE RESECTION N/A 07/29/2018   Procedure: LAPAROSCOPIC GASTRIC SLEEVE RESECTION, UPPER ENDO, ERAS Pathway;  Surgeon: Glenna Fellows, MD;  Location: [facility];  Service: General;  Laterality: N/A;   PATENT FORAMEN OVALE CLOSURE  07/09/2014   PATENT FORAMEN OVALE CLOSURE N/A 07/09/2014   Procedure: PATENT FORAMEN OVALE CLOSURE;  Surgeon: Micheline Chapman, MD;  Location: [facility] CATH LAB;  Service: Cardiovascular;  Laterality: N/A;   RADIOLOGY WITH ANESTHESIA N/A 05/29/2014   Procedure: RADIOLOGY WITH ANESTHESIA;   Surgeon: Oneal Grout, MD;  Location: [facility] OR;  Service: Radiology;  Laterality: N/A;   TEE WITHOUT CARDIOVERSION N/A 06/03/2014   Procedure: TRANSESOPHAGEAL ECHOCARDIOGRAM (TEE);  Surgeon: Wendall Stade, MD;  Location: [facility] ENDOSCOPY;  Service: Cardiovascular;  Laterality: N/A;    MEDICATIONS:  Cholecalciferol 125 MCG (5000 UT) capsule   levonorgestrel (MIRENA, 52 MG,) 20 MCG/24HR IUD   Multiple Vitamins-Minerals (MULTIVITAMIN WITH MINERALS) tablet   ondansetron (ZOFRAN) 4 MG tablet   thiamine (VITAMIN B-1) 50 MG tablet   No current facility-administered medications for this encounter.    Jodell Cipro, PA-C [facility] Pre-Surgical Testing [phone]

## 2020-12-17 ENCOUNTER — Other Ambulatory Visit: Payer: Self-pay

## 2020-12-20 LAB — SARS CORONAVIRUS 2 (TAT 6-24 HRS): SARS Coronavirus 2: NEGATIVE

## 2020-12-20 MED ORDER — BUPIVACAINE LIPOSOME 1.3 % IJ SUSP
20.0000 mL | Freq: Once | INTRAMUSCULAR | Status: DC
  Filled 2020-12-20: qty 20

## 2020-12-21 ENCOUNTER — Inpatient Hospital Stay (HOSPITAL_COMMUNITY)
Admission: RE | Admit: 2020-12-21 | Discharge: 2020-12-23 | DRG: 327 | Disposition: A | Discharge status: Home / Self Care | Payer: 59 | Attending: Surgery | Admitting: Surgery

## 2020-12-21 ENCOUNTER — Encounter (HOSPITAL_COMMUNITY)
Admission: RE | Disposition: A | Discharge status: Home / Self Care | Payer: Self-pay | Source: Home / Self Care | Attending: Surgery

## 2020-12-21 ENCOUNTER — Other Ambulatory Visit: Payer: Self-pay

## 2020-12-21 ENCOUNTER — Inpatient Hospital Stay (HOSPITAL_COMMUNITY): Payer: 59 | Admitting: Physician Assistant

## 2020-12-21 ENCOUNTER — Inpatient Hospital Stay (HOSPITAL_COMMUNITY): Payer: 59 | Admitting: Certified Registered Nurse Anesthetist

## 2020-12-21 ENCOUNTER — Encounter (HOSPITAL_COMMUNITY): Payer: Self-pay | Admitting: Surgery

## 2020-12-21 DIAGNOSIS — K219 Gastro-esophageal reflux disease without esophagitis: Principal | ICD-10-CM | POA: Diagnosis present

## 2020-12-21 DIAGNOSIS — G43909 Migraine, unspecified, not intractable, without status migrainosus: Secondary | ICD-10-CM | POA: Diagnosis present

## 2020-12-21 DIAGNOSIS — E785 Hyperlipidemia, unspecified: Secondary | ICD-10-CM

## 2020-12-21 DIAGNOSIS — Z9049 Acquired absence of other specified parts of digestive tract: Secondary | ICD-10-CM

## 2020-12-21 DIAGNOSIS — I6782 Cerebral ischemia: Secondary | ICD-10-CM

## 2020-12-21 DIAGNOSIS — R1310 Dysphagia, unspecified: Secondary | ICD-10-CM | POA: Diagnosis not present

## 2020-12-21 DIAGNOSIS — E559 Vitamin D deficiency, unspecified: Secondary | ICD-10-CM | POA: Diagnosis not present

## 2020-12-21 DIAGNOSIS — Z8249 Family history of ischemic heart disease and other diseases of the circulatory system: Secondary | ICD-10-CM | POA: Diagnosis not present

## 2020-12-21 DIAGNOSIS — G8190 Hemiplegia, unspecified affecting unspecified side: Secondary | ICD-10-CM

## 2020-12-21 DIAGNOSIS — Z6839 Body mass index (BMI) 39.0-39.9, adult: Secondary | ICD-10-CM | POA: Diagnosis not present

## 2020-12-21 DIAGNOSIS — I63412 Cerebral infarction due to embolism of left middle cerebral artery: Secondary | ICD-10-CM

## 2020-12-21 DIAGNOSIS — K429 Umbilical hernia without obstruction or gangrene: Secondary | ICD-10-CM | POA: Diagnosis not present

## 2020-12-21 DIAGNOSIS — K449 Diaphragmatic hernia without obstruction or gangrene: Secondary | ICD-10-CM | POA: Diagnosis present

## 2020-12-21 DIAGNOSIS — H1010 Acute atopic conjunctivitis, unspecified eye: Secondary | ICD-10-CM

## 2020-12-21 DIAGNOSIS — J309 Allergic rhinitis, unspecified: Secondary | ICD-10-CM

## 2020-12-21 DIAGNOSIS — R1314 Dysphagia, pharyngoesophageal phase: Secondary | ICD-10-CM

## 2020-12-21 DIAGNOSIS — Z79899 Other long term (current) drug therapy: Secondary | ICD-10-CM

## 2020-12-21 DIAGNOSIS — Z8673 Personal history of transient ischemic attack (TIA), and cerebral infarction without residual deficits: Secondary | ICD-10-CM

## 2020-12-21 DIAGNOSIS — R739 Hyperglycemia, unspecified: Secondary | ICD-10-CM

## 2020-12-21 DIAGNOSIS — Z0001 Encounter for general adult medical examination with abnormal findings: Secondary | ICD-10-CM

## 2020-12-21 DIAGNOSIS — Z975 Presence of (intrauterine) contraceptive device: Secondary | ICD-10-CM

## 2020-12-21 DIAGNOSIS — E519 Thiamine deficiency, unspecified: Secondary | ICD-10-CM

## 2020-12-21 DIAGNOSIS — Q2112 Patent foramen ovale: Secondary | ICD-10-CM

## 2020-12-21 DIAGNOSIS — Z793 Long term (current) use of hormonal contraceptives: Secondary | ICD-10-CM

## 2020-12-21 DIAGNOSIS — I5022 Chronic systolic (congestive) heart failure: Secondary | ICD-10-CM | POA: Diagnosis not present

## 2020-12-21 DIAGNOSIS — Z8774 Personal history of (corrected) congenital malformations of heart and circulatory system: Secondary | ICD-10-CM

## 2020-12-21 DIAGNOSIS — D539 Nutritional anemia, unspecified: Secondary | ICD-10-CM

## 2020-12-21 DIAGNOSIS — Q211 Atrial septal defect: Secondary | ICD-10-CM

## 2020-12-21 DIAGNOSIS — Z1211 Encounter for screening for malignant neoplasm of colon: Secondary | ICD-10-CM

## 2020-12-21 DIAGNOSIS — K2100 Gastro-esophageal reflux disease with esophagitis, without bleeding: Secondary | ICD-10-CM

## 2020-12-21 DIAGNOSIS — Z9884 Bariatric surgery status: Secondary | ICD-10-CM

## 2020-12-21 DIAGNOSIS — I253 Aneurysm of heart: Secondary | ICD-10-CM

## 2020-12-21 HISTORY — PX: HIATAL HERNIA REPAIR: SHX195

## 2020-12-21 LAB — CBC
HCT: 37.2 % (ref 36.0–46.0)
Hemoglobin: 11.8 g/dL — ABNORMAL LOW (ref 12.0–15.0)
MCH: 31.8 pg (ref 26.0–34.0)
MCHC: 31.7 g/dL (ref 30.0–36.0)
MCV: 100.3 fL — ABNORMAL HIGH (ref 80.0–100.0)
Platelets: 327 10*3/uL (ref 150–400)
RBC: 3.71 MIL/uL — ABNORMAL LOW (ref 3.87–5.11)
RDW: 12.7 % (ref 11.5–15.5)
WBC: 14.6 10*3/uL — ABNORMAL HIGH (ref 4.0–10.5)
nRBC: 0 % (ref 0.0–0.2)

## 2020-12-21 LAB — PREGNANCY, URINE: Preg Test, Ur: NEGATIVE

## 2020-12-21 LAB — CREATININE, SERUM
Creatinine, Ser: 0.95 mg/dL (ref 0.44–1.00)
GFR, Estimated: >60 mL/min (ref >60)

## 2020-12-21 LAB — HEMOGLOBIN AND HEMATOCRIT, BLOOD
HCT: 37.1 % (ref 36.0–46.0)
Hemoglobin: 12.2 g/dL (ref 12.0–15.0)

## 2020-12-21 SURGERY — CREATION, GASTRIC BYPASS, ROUX-EN-Y, ROBOT-ASSISTED
Anesthesia: General | Site: Abdomen

## 2020-12-21 MED ORDER — ENSURE MAX PROTEIN PO LIQD
2.0000 [oz_av] | ORAL | Status: DC
  Administered 2020-12-22 (×7): 2 [oz_av] via ORAL

## 2020-12-21 MED ORDER — KETAMINE HCL 10 MG/ML IJ SOLN
INTRAMUSCULAR | Status: DC | PRN
  Administered 2020-12-21: 30 mg via INTRAVENOUS

## 2020-12-21 MED ORDER — SCOPOLAMINE 1 MG/3DAYS TD PT72
MEDICATED_PATCH | TRANSDERMAL | Status: AC
  Filled 2020-12-21: qty 1

## 2020-12-21 MED ORDER — MIDAZOLAM HCL 2 MG/2ML IJ SOLN
INTRAMUSCULAR | Status: DC | PRN
  Administered 2020-12-21: 2 mg via INTRAVENOUS

## 2020-12-21 MED ORDER — PROPOFOL 10 MG/ML IV BOLUS
INTRAVENOUS | Status: AC
  Filled 2020-12-21: qty 40

## 2020-12-21 MED ORDER — ACETAMINOPHEN 500 MG PO TABS
1000.0000 mg | ORAL_TABLET | Freq: Three times a day (TID) | ORAL | Status: DC
  Administered 2020-12-21 – 2020-12-23 (×4): 1000 mg via ORAL
  Filled 2020-12-21 (×6): qty 2

## 2020-12-21 MED ORDER — LACTATED RINGERS IV SOLN: INTRAVENOUS | Status: DC

## 2020-12-21 MED ORDER — CHLORHEXIDINE GLUCONATE 0.12 % MT SOLN
15.0000 mL | Freq: Once | OROMUCOSAL | Status: AC
  Administered 2020-12-21: 15 mL via OROMUCOSAL

## 2020-12-21 MED ORDER — FENTANYL CITRATE (PF) 100 MCG/2ML IJ SOLN
INTRAMUSCULAR | Status: AC
  Filled 2020-12-21: qty 2

## 2020-12-21 MED ORDER — HYDROMORPHONE HCL 1 MG/ML IJ SOLN
0.5000 mg | INTRAMUSCULAR | Status: DC | PRN
Start: 2020-12-21 — End: 2020-12-21
  Administered 2020-12-21: 0.5 mg via INTRAVENOUS

## 2020-12-21 MED ORDER — HYDROMORPHONE HCL 1 MG/ML IJ SOLN
1.0000 mg | Freq: Once | INTRAMUSCULAR | Status: AC
Start: 2020-12-21 — End: 2020-12-21
  Administered 2020-12-21: 1 mg via INTRAVENOUS
  Filled 2020-12-21: qty 1

## 2020-12-21 MED ORDER — PHENYLEPHRINE 40 MCG/ML (10ML) SYRINGE FOR IV PUSH (FOR BLOOD PRESSURE SUPPORT)
PREFILLED_SYRINGE | INTRAVENOUS | Status: AC
  Filled 2020-12-21: qty 10

## 2020-12-21 MED ORDER — HYDROMORPHONE HCL 1 MG/ML IJ SOLN
INTRAMUSCULAR | Status: AC
  Filled 2020-12-21: qty 1

## 2020-12-21 MED ORDER — HYDROMORPHONE HCL 1 MG/ML IJ SOLN
1.0000 mg | INTRAMUSCULAR | Status: DC | PRN
  Administered 2020-12-21 – 2020-12-22 (×2): 1 mg via INTRAVENOUS
  Filled 2020-12-21 (×2): qty 1

## 2020-12-21 MED ORDER — FENTANYL CITRATE (PF) 250 MCG/5ML IJ SOLN
INTRAMUSCULAR | Status: DC | PRN
  Administered 2020-12-21: 100 ug via INTRAVENOUS
  Administered 2020-12-21 (×2): 25 ug via INTRAVENOUS
  Administered 2020-12-21 (×2): 50 ug via INTRAVENOUS

## 2020-12-21 MED ORDER — ONDANSETRON HCL 4 MG/2ML IJ SOLN: 4.0000 mg | INTRAMUSCULAR | Status: DC | PRN

## 2020-12-21 MED ORDER — PHENYLEPHRINE HCL-NACL 20-0.9 MG/250ML-% IV SOLN
INTRAVENOUS | Status: DC | PRN
  Administered 2020-12-21: 20 ug/min via INTRAVENOUS

## 2020-12-21 MED ORDER — DEXAMETHASONE SODIUM PHOSPHATE 10 MG/ML IJ SOLN
INTRAMUSCULAR | Status: DC | PRN
  Administered 2020-12-21: 8 mg via INTRAVENOUS

## 2020-12-21 MED ORDER — ACETAMINOPHEN 500 MG PO TABS
1000.0000 mg | ORAL_TABLET | ORAL | Status: AC
Start: 2020-12-21 — End: 2020-12-21
  Administered 2020-12-21: 1000 mg via ORAL
  Filled 2020-12-21: qty 2

## 2020-12-21 MED ORDER — PHENYLEPHRINE HCL (PRESSORS) 10 MG/ML IV SOLN
INTRAVENOUS | Status: AC
  Filled 2020-12-21: qty 2

## 2020-12-21 MED ORDER — CHLORHEXIDINE GLUCONATE CLOTH 2 % EX PADS: 6.0000 | MEDICATED_PAD | Freq: Once | CUTANEOUS | Status: DC

## 2020-12-21 MED ORDER — LIDOCAINE 2% (20 MG/ML) 5 ML SYRINGE
INTRAMUSCULAR | Status: DC | PRN
  Administered 2020-12-21: 1.5 mg/kg/h via INTRAVENOUS

## 2020-12-21 MED ORDER — KETAMINE HCL 10 MG/ML IJ SOLN
INTRAMUSCULAR | Status: AC
  Filled 2020-12-21: qty 1

## 2020-12-21 MED ORDER — LIDOCAINE 2% (20 MG/ML) 5 ML SYRINGE
INTRAMUSCULAR | Status: AC
  Filled 2020-12-21: qty 5

## 2020-12-21 MED ORDER — ONDANSETRON HCL 4 MG/2ML IJ SOLN
INTRAMUSCULAR | Status: AC
  Filled 2020-12-21: qty 2

## 2020-12-21 MED ORDER — STERILE WATER FOR IRRIGATION IR SOLN
Status: DC | PRN
  Administered 2020-12-21: 1000 mL

## 2020-12-21 MED ORDER — APREPITANT 40 MG PO CAPS
40.0000 mg | ORAL_CAPSULE | ORAL | Status: AC
  Administered 2020-12-21: 40 mg via ORAL
  Filled 2020-12-21: qty 1

## 2020-12-21 MED ORDER — LACTATED RINGERS IR SOLN
Status: DC | PRN
  Administered 2020-12-21: 1000 mL

## 2020-12-21 MED ORDER — ONDANSETRON HCL 4 MG/2ML IJ SOLN
INTRAMUSCULAR | Status: DC | PRN
  Administered 2020-12-21: 4 mg via INTRAVENOUS

## 2020-12-21 MED ORDER — GABAPENTIN 300 MG PO CAPS
300.0000 mg | ORAL_CAPSULE | ORAL | Status: AC
  Administered 2020-12-21: 300 mg via ORAL
  Filled 2020-12-21: qty 1

## 2020-12-21 MED ORDER — ROCURONIUM BROMIDE 10 MG/ML (PF) SYRINGE
PREFILLED_SYRINGE | INTRAVENOUS | Status: AC
  Filled 2020-12-21: qty 10

## 2020-12-21 MED ORDER — SCOPOLAMINE 1 MG/3DAYS TD PT72
MEDICATED_PATCH | TRANSDERMAL | Status: DC | PRN
  Administered 2020-12-21: 1 via TRANSDERMAL

## 2020-12-21 MED ORDER — OXYCODONE HCL 5 MG/5ML PO SOLN
5.0000 mg | Freq: Four times a day (QID) | ORAL | Status: DC | PRN
  Administered 2020-12-21 – 2020-12-22 (×2): 5 mg via ORAL
  Filled 2020-12-21 (×2): qty 5

## 2020-12-21 MED ORDER — LIDOCAINE 2% (20 MG/ML) 5 ML SYRINGE
INTRAMUSCULAR | Status: AC
  Filled 2020-12-21: qty 15

## 2020-12-21 MED ORDER — MIDAZOLAM HCL 2 MG/2ML IJ SOLN
INTRAMUSCULAR | Status: AC
  Filled 2020-12-21: qty 2

## 2020-12-21 MED ORDER — FENTANYL CITRATE (PF) 100 MCG/2ML IJ SOLN
25.0000 ug | INTRAMUSCULAR | Status: DC | PRN
  Administered 2020-12-21 (×3): 50 ug via INTRAVENOUS

## 2020-12-21 MED ORDER — FENTANYL CITRATE (PF) 250 MCG/5ML IJ SOLN
INTRAMUSCULAR | Status: AC
  Filled 2020-12-21: qty 5

## 2020-12-21 MED ORDER — DEXAMETHASONE SODIUM PHOSPHATE 10 MG/ML IJ SOLN
INTRAMUSCULAR | Status: AC
  Filled 2020-12-21: qty 1

## 2020-12-21 MED ORDER — AMISULPRIDE (ANTIEMETIC) 5 MG/2ML IV SOLN: 10.0000 mg | Freq: Once | INTRAVENOUS | Status: DC | PRN

## 2020-12-21 MED ORDER — PHENYLEPHRINE 40 MCG/ML (10ML) SYRINGE FOR IV PUSH (FOR BLOOD PRESSURE SUPPORT)
PREFILLED_SYRINGE | INTRAVENOUS | Status: DC | PRN
  Administered 2020-12-21: 80 ug via INTRAVENOUS
  Administered 2020-12-21: 40 ug via INTRAVENOUS
  Administered 2020-12-21 (×2): 80 ug via INTRAVENOUS

## 2020-12-21 MED ORDER — LIDOCAINE HCL (CARDIAC) PF 100 MG/5ML IV SOSY
PREFILLED_SYRINGE | INTRAVENOUS | Status: DC | PRN
  Administered 2020-12-21: 60 mg via INTRAVENOUS

## 2020-12-21 MED ORDER — BUPIVACAINE-EPINEPHRINE (PF) 0.25% -1:200000 IJ SOLN
INTRAMUSCULAR | Status: AC
  Filled 2020-12-21: qty 30

## 2020-12-21 MED ORDER — ACETAMINOPHEN 160 MG/5ML PO SOLN: 1000.0000 mg | Freq: Three times a day (TID) | ORAL | Status: DC

## 2020-12-21 MED ORDER — 0.9 % SODIUM CHLORIDE (POUR BTL) OPTIME
TOPICAL | Status: DC | PRN
  Administered 2020-12-21: 1000 mL

## 2020-12-21 MED ORDER — ORAL CARE MOUTH RINSE: 15.0000 mL | Freq: Once | OROMUCOSAL | Status: AC

## 2020-12-21 MED ORDER — PROPOFOL 10 MG/ML IV BOLUS
INTRAVENOUS | Status: DC | PRN
  Administered 2020-12-21: 200 mg via INTRAVENOUS

## 2020-12-21 MED ORDER — SUGAMMADEX SODIUM 500 MG/5ML IV SOLN
INTRAVENOUS | Status: DC | PRN
  Administered 2020-12-21: 250 mg via INTRAVENOUS

## 2020-12-21 MED ORDER — ROCURONIUM BROMIDE 10 MG/ML (PF) SYRINGE
PREFILLED_SYRINGE | INTRAVENOUS | Status: DC | PRN
  Administered 2020-12-21: 60 mg via INTRAVENOUS
  Administered 2020-12-21: 10 mg via INTRAVENOUS
  Administered 2020-12-21: 20 mg via INTRAVENOUS
  Administered 2020-12-21: 40 mg via INTRAVENOUS

## 2020-12-21 MED ORDER — ENOXAPARIN SODIUM 30 MG/0.3ML IJ SOSY
30.0000 mg | PREFILLED_SYRINGE | Freq: Two times a day (BID) | INTRAMUSCULAR | Status: DC
  Administered 2020-12-21 – 2020-12-23 (×4): 30 mg via SUBCUTANEOUS
  Filled 2020-12-21 (×4): qty 0.3

## 2020-12-21 MED ORDER — ENOXAPARIN SODIUM 40 MG/0.4ML IJ SOSY
40.0000 mg | PREFILLED_SYRINGE | INTRAMUSCULAR | Status: AC
  Administered 2020-12-21: 40 mg via SUBCUTANEOUS
  Filled 2020-12-21: qty 0.4

## 2020-12-21 MED ORDER — PANTOPRAZOLE SODIUM 40 MG IV SOLR
40.0000 mg | Freq: Every day | INTRAVENOUS | Status: DC
  Administered 2020-12-21 – 2020-12-22 (×2): 40 mg via INTRAVENOUS
  Filled 2020-12-21 (×2): qty 40

## 2020-12-21 MED ORDER — BUPIVACAINE-EPINEPHRINE 0.25% -1:200000 IJ SOLN
INTRAMUSCULAR | Status: DC | PRN
  Administered 2020-12-21: 30 mL

## 2020-12-21 MED ORDER — SODIUM CHLORIDE 0.9 % IV SOLN
2.0000 g | INTRAVENOUS | Status: AC
  Administered 2020-12-21: 2 g via INTRAVENOUS
  Filled 2020-12-21: qty 2

## 2020-12-21 MED ORDER — BUPIVACAINE LIPOSOME 1.3 % IJ SUSP
INTRAMUSCULAR | Status: DC | PRN
  Administered 2020-12-21: 20 mL

## 2020-12-21 MED ORDER — HYDROMORPHONE HCL 1 MG/ML IJ SOLN
0.2500 mg | INTRAMUSCULAR | Status: DC | PRN
  Administered 2020-12-21 (×2): 0.5 mg via INTRAVENOUS

## 2020-12-21 SURGICAL SUPPLY — 87 items
ADH SKN CLS APL DERMABOND .7 (GAUZE/BANDAGES/DRESSINGS) ×2
APL PRP STRL LF DISP 70% ISPRP (MISCELLANEOUS) ×2
APPLIER CLIP 5 13 M/L LIGAMAX5 (MISCELLANEOUS)
APPLIER CLIP ROT 10 11.4 M/L (STAPLE)
APR CLP MED LRG 11.4X10 (STAPLE)
APR CLP MED LRG 5 ANG JAW (MISCELLANEOUS)
BAG COUNTER SPONGE SURGICOUNT (BAG) ×1 IMPLANT
BAG SPNG CNTER NS LX DISP (BAG)
BLADE SURG SZ11 CARB STEEL (BLADE) ×3 IMPLANT
CANNULA REDUC XI 12-8 STAPL (CANNULA) ×3
CANNULA REDUCER 12-8 DVNC XI (CANNULA) ×2 IMPLANT
CHLORAPREP W/TINT 26 (MISCELLANEOUS) ×3 IMPLANT
CLIP APPLIE 5 13 M/L LIGAMAX5 (MISCELLANEOUS) IMPLANT
CLIP APPLIE ROT 10 11.4 M/L (STAPLE) IMPLANT
COVER SURGICAL LIGHT HANDLE (MISCELLANEOUS) ×3 IMPLANT
COVER TIP SHEARS 8 DVNC (MISCELLANEOUS) ×2 IMPLANT
COVER TIP SHEARS 8MM DA VINCI (MISCELLANEOUS) ×3
DECANTER SPIKE VIAL GLASS SM (MISCELLANEOUS) ×3 IMPLANT
DERMABOND ADVANCED (GAUZE/BANDAGES/DRESSINGS) ×1
DERMABOND ADVANCED .7 DNX12 (GAUZE/BANDAGES/DRESSINGS) ×2 IMPLANT
DRAPE ARM DVNC X/XI (DISPOSABLE) ×8 IMPLANT
DRAPE COLUMN DVNC XI (DISPOSABLE) ×2 IMPLANT
DRAPE DA VINCI XI ARM (DISPOSABLE) ×12
DRAPE DA VINCI XI COLUMN (DISPOSABLE) ×3
ELECT PENCIL ROCKER SW 15FT (MISCELLANEOUS) ×1 IMPLANT
ELECT REM PT RETURN 15FT ADLT (MISCELLANEOUS) ×3 IMPLANT
ENDOLOOP SUT PDS II  0 18 (SUTURE)
ENDOLOOP SUT PDS II 0 18 (SUTURE) IMPLANT
GAUZE 4X4 16PLY ~~LOC~~+RFID DBL (SPONGE) ×3 IMPLANT
GLOVE SURG ENC MOIS LTX SZ7.5 (GLOVE) ×9 IMPLANT
GLOVE SURG UNDER LTX SZ8 (GLOVE) ×9 IMPLANT
GOWN STRL REUS W/TWL XL LVL3 (GOWN DISPOSABLE) ×9 IMPLANT
GRASPER SUT TROCAR 14GX15 (MISCELLANEOUS) ×2 IMPLANT
IRRIG SUCT STRYKERFLOW 2 WTIP (MISCELLANEOUS)
IRRIGATION SUCT STRKRFLW 2 WTP (MISCELLANEOUS) IMPLANT
KIT BASIN OR (CUSTOM PROCEDURE TRAY) ×3 IMPLANT
KIT GASTRIC LAVAGE 34FR ADT (SET/KITS/TRAYS/PACK) ×3 IMPLANT
LUBRICANT JELLY K Y 4OZ (MISCELLANEOUS) IMPLANT
MARKER SKIN DUAL TIP RULER LAB (MISCELLANEOUS) ×3 IMPLANT
MAT PREVALON FULL STRYKER (MISCELLANEOUS) ×3 IMPLANT
NDL SPNL 18GX3.5 QUINCKE PK (NEEDLE) ×1 IMPLANT
NDL SPNL 22GX3.5 QUINCKE BK (NEEDLE) ×2 IMPLANT
NEEDLE SPNL 18GX3.5 QUINCKE PK (NEEDLE) ×3 IMPLANT
NEEDLE SPNL 22GX3.5 QUINCKE BK (NEEDLE) IMPLANT
OBTURATOR OPTICAL STANDARD 8MM (TROCAR) ×3
OBTURATOR OPTICAL STND 8 DVNC (TROCAR) ×2
OBTURATOR OPTICALSTD 8 DVNC (TROCAR) ×2 IMPLANT
PACK CARDIOVASCULAR III (CUSTOM PROCEDURE TRAY) ×3 IMPLANT
RELOAD STAPLE 60 2.5 WHT DVNC (STAPLE) IMPLANT
RELOAD STAPLE 60 3.5 BLU DVNC (STAPLE) IMPLANT
RELOAD STAPLER 2.5X60 WHT DVNC (STAPLE) ×8 IMPLANT
RELOAD STAPLER 3.5X60 BLU DVNC (STAPLE) IMPLANT
SCISSORS LAP 5X45 EPIX DISP (ENDOMECHANICALS) IMPLANT
SEAL CANN UNIV 5-8 DVNC XI (MISCELLANEOUS) ×7 IMPLANT
SEAL XI 5MM-8MM UNIVERSAL (MISCELLANEOUS) ×12
SEALER SYNCHRO 8 IS4000 DV (MISCELLANEOUS) ×3
SEALER SYNCHRO 8 IS4000 DVNC (MISCELLANEOUS) ×2 IMPLANT
SEALER VESSEL DA VINCI XI (MISCELLANEOUS)
SEALER VESSEL EXT DVNC XI (MISCELLANEOUS) IMPLANT
SOL ANTI FOG 6CC (MISCELLANEOUS) ×2 IMPLANT
SOLUTION ANTI FOG 6CC (MISCELLANEOUS) ×1
SOLUTION ELECTROLUBE (MISCELLANEOUS) ×3 IMPLANT
STAPLER 60 DA VINCI SURE FORM (STAPLE) ×3
STAPLER 60 SUREFORM DVNC (STAPLE) ×2 IMPLANT
STAPLER CANNULA SEAL DVNC XI (STAPLE) ×2 IMPLANT
STAPLER CANNULA SEAL XI (STAPLE) ×3
STAPLER RELOAD 2.5X60 WHITE (STAPLE) ×12
STAPLER RELOAD 2.5X60 WHT DVNC (STAPLE) ×8
STAPLER RELOAD 3.5X60 BLU DVNC (STAPLE)
STAPLER RELOAD 3.5X60 BLUE (STAPLE)
SUT ETHIBOND 0 (SUTURE) ×10 IMPLANT
SUT MNCRL AB 4-0 PS2 18 (SUTURE) ×3 IMPLANT
SUT PDS AB 2-0 CT2 27 (SUTURE) ×4 IMPLANT
SUT V-LOC BARB 180 2/0GR6 GS22 (SUTURE) ×9
SUT VIC AB 2-0 SH 27 (SUTURE) ×3
SUT VIC AB 2-0 SH 27XBRD (SUTURE) ×2 IMPLANT
SUT VICRYL 0 TIES 12 18 (SUTURE) ×2 IMPLANT
SUT VLOC BARB 180 ABS3/0GR12 (SUTURE) ×9
SUTURE V-LC BRB 180 2/0GR6GS22 (SUTURE) IMPLANT
SUTURE VLOC BRB 180 ABS3/0GR12 (SUTURE) ×3 IMPLANT
SYR 20ML LL LF (SYRINGE) ×3 IMPLANT
TOWEL OR 17X26 10 PK STRL BLUE (TOWEL DISPOSABLE) ×3 IMPLANT
TRAY FOLEY MTR SLVR 14FR STAT (SET/KITS/TRAYS/PACK) ×1 IMPLANT
TROCAR ADV FIXATION 12X100MM (TROCAR) ×3 IMPLANT
TROCAR Z-THREAD FIOS 5X100MM (TROCAR) ×3 IMPLANT
TUBE CALIBRATION LAPBAND (TUBING) IMPLANT
TUBING INSUFFLATION 10FT LAP (TUBING) ×3 IMPLANT

## 2020-12-21 NOTE — H&P (Addendum)
Admitting Physician: Hyman Hopes Rebecca Russo  Service: Bariatric surgery  CC: Severe reflux  Subjective   HPI: Rebecca Russo is an 45 y.o. female who is here for severe reflux - daily regurgitation - after sleeve gastrectomy and hiatal hernia repair, now with hiatal hernia. Here for hiatal hernia repair and conversion of sleeve gastrectomy to gastric bypass.  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   Anemia    Aphasia complicating stroke    Bilateral swelling of feet    Chronic systolic heart failure (HCC) 06/09/2016   a - Prob nonischemic //  Echo 10/17: EF 40-45, diffuse HK, mild LAE, PFO closure device without residual shunt  //  Could not tol cMRI // Echo 4/18: EF 55, normal wall motion, normal diastolic function, trivial MR, PFO device in place without residual shunt, PASP 24   Constipation    Depression    Eczema    GERD (gastroesophageal reflux disease)    H/O varicella    Hemiplegia affecting dominant side (HCC)    History of ischemic left MCA stroke    weakness on right side   Increased BMI 12/17/2008   LAE (left atrial enlargement) 04/05/2018   Mod, noted on ECHO   Migraines    Morbid obesity (HCC)    PFO with atrial septal aneurysm 06/04/2014   a - s/p transcatheter closure in 2/16 with 25 mm Amplatzer Cribiform ASO device // b - Echo 10/17:  EF 40-45, diffuse HK, mild LAE, PFO closure device without residual shunt    Stroke (cerebrum) (HCC)    Vitamin D deficiency     Past Surgical History:  Procedure Laterality Date   CESAREAN SECTION  1999   COLON SURGERY  2004   partial colectomy   LAPAROSCOPIC CHOLECYSTECTOMY  06/20/2012   LAPAROSCOPIC GASTRIC SLEEVE RESECTION N/A 07/29/2018   Procedure: LAPAROSCOPIC GASTRIC SLEEVE RESECTION, UPPER ENDO, ERAS Pathway;  Surgeon: Glenna Fellows, MD;  Location: [facility] ORS;  Service: General;  Laterality: N/A;   PATENT FORAMEN OVALE CLOSURE  07/09/2014   PATENT FORAMEN OVALE CLOSURE N/A  07/09/2014   Procedure: PATENT FORAMEN OVALE CLOSURE;  Surgeon: Micheline Chapman, MD;  Location: [facility] CATH LAB;  Service: Cardiovascular;  Laterality: N/A;   RADIOLOGY WITH ANESTHESIA N/A 05/29/2014   Procedure: RADIOLOGY WITH ANESTHESIA;  Surgeon: Oneal Grout, MD;  Location: [facility] OR;  Service: Radiology;  Laterality: N/A;   TEE WITHOUT CARDIOVERSION N/A 06/03/2014   Procedure: TRANSESOPHAGEAL ECHOCARDIOGRAM (TEE);  Surgeon: Wendall Stade, MD;  Location: [facility] ENDOSCOPY;  Service: Cardiovascular;  Laterality: N/A;    Family History  Problem Relation Age of Onset   Hypertension Mother    Diabetes Mother    Heart disease Mother    Sleep apnea Mother    Hypertension Father    Stroke Father    Diabetes Father    Heart disease Father    Stroke Paternal Grandfather    Stroke Maternal Grandfather    Cancer Other    Breast cancer Neg Hx    Colon cancer Neg Hx     Social:  reports that she has never smoked. She has never used smokeless tobacco. She reports that she does not drink alcohol and does not use drugs.  Allergies: No Known Allergies  Medications: Current Outpatient Medications  Medication Instructions   Cholecalciferol 5,000 Units, Oral, Daily   levonorgestrel (MIRENA, 52 MG,) 20 MCG/24HR IUD 1 each, Intrauterine,  Once   Multiple Vitamins-Minerals (MULTIVITAMIN WITH MINERALS)  tablet 1 tablet, Oral, Daily   ondansetron (ZOFRAN) 4 MG tablet TAKE 1 TABLET BY MOUTH EVERY 4-6 HOURS AS NEEDED   thiamine (VITAMIN B-1) 50 mg, Oral, Daily    ROS - all of the below systems have been reviewed with the patient and positives are indicated with bold text General: chills, fever or night sweats Eyes: blurry vision or double vision ENT: epistaxis or sore throat Allergy/Immunology: itchy/watery eyes or nasal congestion Hematologic/Lymphatic: bleeding problems, blood clots or swollen lymph nodes Endocrine: temperature intolerance or unexpected weight changes Breast: new or changing breast  lumps or nipple discharge Resp: cough, shortness of breath, or wheezing CV: chest pain or dyspnea on exertion GI: as per HPI GU: dysuria, trouble voiding, or hematuria MSK: joint pain or joint stiffness Neuro: TIA or stroke symptoms Derm: pruritus and skin lesion changes Psych: anxiety and depression  Objective   PE Blood pressure 122/76, pulse 77, temperature 99.1 F (37.3 C), temperature source Oral, resp. rate 16, weight 125.5 kg, SpO2 96 %. Constitutional: NAD; conversant; no deformities Eyes: Moist conjunctiva; no lid lag; anicteric; PERRL Neck: Trachea midline; no thyromegaly Lungs: Normal respiratory effort; no tactile fremitus CV: RRR; no palpable thrills; no pitting edema GI: Abd soft, non-tender; no palpable hepatosplenomegaly MSK: Normal range of motion of extremities; no clubbing/cyanosis Psychiatric: Appropriate affect; alert and oriented x3 Lymphatic: No palpable cervical or axillary lymphadenopathy  Results for orders placed or performed during the hospital encounter of 12/21/20 (from the past 24 hour(s))  Pregnancy, urine STAT morning of surgery     Status: None   Collection Time: 12/21/20  5:25 AM  Result Value Ref Range   Preg Test, Ur NEGATIVE NEGATIVE    Imaging Orders  No imaging studies ordered today     Assessment and Plan  [name] is a 45 year old female with severe dysphagia and regurgitation from a hiatal hernia after sleeve gastrectomy with hiatal hernia repair. 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  On multiple occasions in the office, with her brother present for some of the visits, we discussed the pathophysiology of hiatal hernias. I discussed the difficulty in providing a lasting repair due to movement of the diaphragm, pressure difference between the chest and abdomen, and inability to provide longterm reinforcement of the muscular closure  with permanent mesh. I explained how her sleeve gastrectomy anatomy also increases her risk of recurrent hernia in the future. There is also the concern for severe reflux after hiatal hernia repair with a sleeve anatomy. At the age of 73, it would be ideal to do one surgery to give her the best chance at never needing foregut surgery again. We discussed the surgical options for repair including simple repair of the hernia alone, or repair of the hernia with conversion from sleeve gastrectomy to gastric bypass. I am concerned about conversion to a bypass due to her subtotal colectomy and possible lower abdominal adhesions, however I won't know how dense the adhesions are until the day of surgery. After a full discussion of the surgeries themselves, as well as their risks, benefits and alternatives, using shared decision making with the patient and the patient's brother present, we decided to proceed with robotic hiatal hernia repair with conversion of sleeve gastrectomy to gastric bypass. The conversion to gastric bypass is necessary to reduce her risk of hiatal hernia recurrence and prevent acid reflux issues related to the sleeve gastrectomy. She  has completed the gastric bypass surgery preoperative pathway and presents to discuss her upcoming scheduled surgery. All questions answered, patient ready to proceed.    No diagnosis found.   Quentin Ore, MD  [facility] Use [email or web address] to contact on call provider

## 2020-12-21 NOTE — Progress Notes (Signed)
Started water and incentive spirometer.   

## 2020-12-21 NOTE — Progress Notes (Signed)
PHARMACY CONSULT FOR:  Risk Assessment for Post-Discharge VTE Following Bariatric Surgery  Post-Discharge VTE Risk Assessment: This patient's probability of 30-day post-discharge VTE is increased due to the factors marked:   Female    Age >/=60 years    BMI >/=50 kg/m2    CHF    Dyspnea at Rest    Paraplegia  X  Non-gastric-band surgery  X  Operation Time >/=3 hr    Return to OR     Length of Stay >/= 3 d   Hx of VTE   Hypercoagulable condition   Significant venous stasis    Predicted probability of 30-day post-discharge VTE: 0.25% (mild)  Other patient-specific factors to consider: N/A  Recommendation for Discharge: No pharmacologic prophylaxis post-discharge   Rebecca Russo is a 45 y.o. female who underwent  robotic sleeve gastrectomy conversion to robotic gastric bypass, hiatal hernia repair, and primary umbilical hernia repair on 12/21/20   Case start: 0800 Case end: 1216   No Known Allergies  Patient Measurements: Weight: 125.5 kg (276 lb 9.6 oz) Body mass index is 39.69 kg/m.  Recent Labs    12/21/20 1258  WBC 14.6*  HGB 11.8*  HCT 37.2  PLT 327   Estimated Creatinine Clearance: 128 mL/min (by C-G formula based on SCr of 0.65 mg/dL).    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   Anemia    Aphasia complicating stroke    Bilateral swelling of feet    Chronic systolic heart failure (HCC) 06/09/2016   a - Prob nonischemic //  Echo 10/17: EF 40-45, diffuse HK, mild LAE, PFO closure device without residual shunt  //  Could not tol cMRI // Echo 4/18: EF 55, normal wall motion, normal diastolic function, trivial MR, PFO device in place without residual shunt, PASP 24   Constipation    Depression    Eczema    GERD (gastroesophageal reflux disease)    H/O varicella    Hemiplegia affecting dominant side (HCC)    History of ischemic left MCA stroke    weakness on right side   Increased BMI 12/17/2008   LAE (left atrial  enlargement) 04/05/2018   Mod, noted on ECHO   Migraines    Morbid obesity (HCC)    PFO with atrial septal aneurysm 06/04/2014   a - s/p transcatheter closure in 2/16 with 25 mm Amplatzer Cribiform ASO device // b - Echo 10/17:  EF 40-45, diffuse HK, mild LAE, PFO closure device without residual shunt    Stroke (cerebrum) (HCC)    Vitamin D deficiency      Medications Prior to Admission  Medication Sig Dispense Refill Last Dose   levonorgestrel (MIRENA, 52 MG,) 20 MCG/24HR IUD 1 each by Intrauterine route once.      Multiple Vitamins-Minerals (MULTIVITAMIN WITH MINERALS) tablet Take 1 tablet by mouth daily.   Past Week   Cholecalciferol 125 MCG (5000 UT) capsule Take 1 capsule (5,000 Units total) by mouth daily. (Patient not taking: Reported on 12/07/2020) 90 capsule 1 Not Taking   ondansetron (ZOFRAN) 4 MG tablet TAKE 1 TABLET BY MOUTH EVERY 4-6 HOURS AS NEEDED (Patient not taking: Reported on 12/07/2020) 30 tablet 2 Not Taking   thiamine (VITAMIN B-1) 50 MG tablet Take 1 tablet (50 mg total) by mouth daily. (Patient not taking: Reported on 12/07/2020) 90 tablet 1 Not Taking     Rebecca Russo 12/21/2020,3:22 PM

## 2020-12-21 NOTE — Progress Notes (Signed)
Patient alert and oriented, Post operative day.  Provided support and encouragement.  Encouraged pulmonary toilet, ambulation and small sips of liquids.  Pain management sheet reviewed with pt.  Needs reinforcement.  All questions answered.  Will continue to monitor.

## 2020-12-21 NOTE — Op Note (Signed)
Patient: Rebecca Russo ([birth year], [id number])  Date of Surgery: 12/21/2020   Preoperative Diagnosis: Severe gastroesophageal reflux disease with daily regurgitation, hiatal hernia  Postoperative Diagnosis: Severe gastroesophageal reflux disease with daily regurgitation, hiatal hernia  Surgical Procedure:  Robotic conversion of sleeve gastrectomy to gastric bypass  Robotic hiatal hernia repair  Open umbilical hernia repair with primary closure  Upper endoscopy  Operative Team Members:  Surgeon(s) and Role:    * Oluwadara Gorman, Hyman Hopes, MD - Primary    * Luretha Murphy, MD - Assisting   Anesthesiologist: Marcene Duos, MD CRNA: Ezekiel Ina, CRNA; Yolonda Kida, CRNA; Sindy Guadeloupe, CRNA   Anesthesia: General   Fluids:  Total I/O In: 2400 [I.V.:2300; IV Piggyback:100] Out: 150 [Urine:100; Blood:50]  Complications: None  Drains: None  Specimen: None  Disposition: PACU  Plan of Care: Admit inpatient    Indications for Procedure:   Rebecca Russo is a 45 year old female with severe GERD and regurgitation from a hiatal hernia after sleeve gastrectomy. 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  On multiple occasions in the office, with her brother present for some of the visits, we discussed the pathophysiology of hiatal hernias. I discussed the difficulty in providing a lasting repair due to movement of the diaphragm, pressure difference between the chest and abdomen, and inability to provide longterm reinforcement of the muscular closure with permanent mesh. I explained how her sleeve gastrectomy anatomy also increases her risk of recurrent hernia in the future. There is also the concern for severe reflux after hiatal hernia repair with a sleeve anatomy. At the age of 77, it would be ideal to do one surgery to give her the best chance at never needing foregut surgery  again. We discussed the surgical options for repair including simple repair of the hernia alone, or repair of the hernia with conversion from sleeve gastrectomy to gastric bypass. I am concerned about conversion to a bypass due to her subtotal colectomy and possible lower abdominal adhesions, however I won't know how dense the adhesions are until the day of surgery. After a full discussion of the surgeries themselves, as well as their risks, benefits and alternatives, using shared decision making with the patient and the patient's brother present, we decided to proceed with robotic hiatal hernia repair with conversion of sleeve gastrectomy to gastric bypass. The conversion to gastric bypass is necessary to reduce her risk of hiatal hernia recurrence and prevent acid reflux issues related to the sleeve gastrectomy. She has completed the gastric bypass surgery preoperative pathway and presents to discuss her upcoming scheduled surgery. All questions answered, patient ready to proceed.    Findings: Severe scar tissue between the superior aspect of the sleeve gastrectomy staple line and the left crus of the diaphragm creating a stricture and kinking at the proximal sleeve.  Infection status: Patient: Private Patient Elective Case Case: Elective Infection Present At Time Of Surgery (PATOS): Some spillage of foregut  and jejunal contents while creating anastomoses   Description of Procedure:   On the date stated above, the patient was taken to the operating room suite and placed in supine positioning.  General endotracheal anesthesia was induced.  A timeout was completed verifying the correct patient, procedure, positioning and equipment needed for the case.  The patient's abdomen was prepped and draped in the usual sterile fashion.  A 5 mm trocar was inserted 22 cm down  from the xiphoid and 4-6 cm to the left of midline using the optical technique.  There was no trauma to the underlying viscera with initial  trocar placement.  Four robotic trocars were placed across the abdomen at this level.  The robotic stapling trocar was placed in the right most position.  The 5 mm trocar was replaced at this level with a 8 mm robotic trocar.  A 12 mm balloon assistant trocar was placed in the left upper quadrant tunneling through the rectus muscle.  The [facility] liver retractor was placed through the subxiphoid region and under the left lobe of the liver and was connected to the rail of the bed.  A TAP block was placed using marcaine and Exparel under direct vision of the laparoscope.  The [employer] XI robotic platform was docked and we transitioned to robotic surgery.  Using the tip up grasper, fenestrated bipolar, 30 degree camera and Synchroseal from the patient's right to left, we began by dissecting at the hiatus.  Pars flaccida was opened and the right crus of the diaphragm was identified.  The phrenoesophageal ligament was divided and we entered the mediastinum.  The right crus was cleared of attachments to the esophagus and this dissection was carried anteriorly and over to the left side of the esophagus.  Dissection of the left side of the esophagus was difficult due to the dense adhesions between the sleeve staple line in the left crus.  These were divided using the Synchro seal.  It appeared the scar tissue between the staple line and crus had created a twist on the very proximal sleeve creating her obstructive issues.  Once these adhesions were divided and the staple line, stomach and GE junction were fully mobilized off the left crus of the diaphragm, the stricture and the sleeve straightened out.  An upper endoscopy was performed at this point to ensure the stricture of the proximal sleeve had been released.  The endoscope was inserted as far as the pylorus and the lumen of the sleeve appeared patent though there was a slight narrowing near where the skin had been.  I continued to mobilize the GE junction out of the  mediastinum dividing the mediastinal attachments to the esophagus.  This mobilized the GE junction about 3 to 5 cm into the abdomen.  It lay there without any retraction up into the chest.  Two 0 Ethibond sutures were placed to reapproximate the right and left crus posteriorly behind the esophagus.  This completed the hiatal hernia closure.  I then ran the small intestine and marked a point 50 cm distal to the ligament of Treitz as well as 75 more centimeters distal for the GJ anastomosis and JJ anastomosis locations respectively.  The small bowel was freely mobile so we proceeded with conversion of her sleeve to gastric bypass.  The adhesions between the stomach, spleen and diaphragm were divided using the Synchroseal to define the angle of His.  I then started 4-6 cm down on the lesser curve of the stomach and created a defect in the gastrohepatic ligament tracking behind the lesser curve of the stomach to enter the lesser sac.  I then used a white loads of the robotic 60 mm Sureform linear stapler to divide the sleeve and form the gastric pouch.  I then directed my attention to the lower abdomen.  As the patient did not have a transverse colon, the anatomy of this bypass looked more like a retro-colic.  The remnant stomach was  freed from adhesions to the spleen and left abdominal wall to allow the roux limb to be brought comfortably up to the pouch.  The jejunum was run to a point 50 cm from the ligament of Treitz.  This loop of bowel was then brought into the left upper quadrant.  A 2-0 v-loc suture was used to create the posterior outer row of the gastrojejunal anastomosis.  A window was made in the jejunal mesentery and the jejunum was divided just proximal to the gastrojejunal anastomosis using a white load of the robotic 60 mm Sureform linear stapler to divide the roux limb from the hepatobiliary limb.  I then continued to create the gastrojejunal anastomosis.  An approximately 2 cm gastrotomy was made in  the pouch and a matching 2 cm enterotomy was created in the roux limb.  Then, 3-0 v-loc was used to create a posterior, inner, full thickness layer of the anastomosis.  A second 3-0 v-loc was used to create an anterior, inner, full thickness layer of the anastomosis.  While sewing the anterior inner layer, the Ewald tube was passed through the anastomosis to ensure patency.  The outer, anterior layer was then created using 2-0 v-loc suture.  At this point the gastrojejunal anastomosis was complete and the Ewald tube was removed.  The roux limb was then run 75 cm from the gastrojejunal anastomosis.  This loop of bowel was brought into the left upper quadrant for anastomosis to the hepatobiliary limb.  A vicryl stay suture was placed from the antimesenteric border of the hepatobiliary limb to the roux limb.  Enterotomies were created in both limbs using the monopolar scissors.  A robotic 60 mm white load on the Sureform linear stapler was introduced into both limbs and fired to create the jejunojejunostomy. A second 60 mm white load of the Sureform linear stapler was inserted into the distal roux limb and proximal common channel  and fired to create a W conformation anastomosis to avoid anastomotic stricture. The anastomosis was inspected, then the common enterotomy was closed using a white load of the Sureform stapler and running 2-0 vicryl suture.  The jejunojejunostomy mesenteric defect was closed using running 0-Ethibond suture.  The retro-roux defect was closed, closing the roux limb mesentery to the retroperitoneum using a running 0-Ethibond suture.   I ran the roux limb from the gastrojejunal anastomosis to the jejunojejunostomy and found the anatomy as expected without any twisting of the mesentery.  The tip-up grasper was used to occlude the proximal roux limb.  I then transitioned to endoscopic portion of the procedure.  The adult upper endoscope was inserted into the pouch.  The pouch appeared  appropriately sized.  There was good intra-luminal hemostasis.  The endoscope was inserted through the anastomosis into the roux limb.  The anastomosis appeared well formed without any stricture.  The anastomosis was submerged in irrigation in the left upper quadrant and there was no leakage of air bubbles with endoscopic insufflation suggesting a negative leak test and an air tight anastomosis.    The foregut was decompressed with the endoscope and the endoscope was removed.  The small bowel was released by the tip-up grasper and then the robotic instruments were removed and the robot was undocked.  The stapler port in the right abdomen was closed at the fascial level using 0-vicryl on a PMI suture passer.  The 12 mm assistant port was not closed as it was tunneled through the abdominal wall.  The liver retractor was removed  under direct vision.  The pneumoperitoneum was evacuated.    There was an umbilical hernia.  I was concerned this could create a issue if the Roux limb were to become incarcerated in this hernia postoperatively.  Decision was made to close this primarily using PDS suture.  Incision was made over the umbilicus where previous incision had been made and we dissected down to the hernia defect.  The edges were cleaned up using electrocautery and the hernia was run closed using two 2-0 PDS suture.  The skin was closed using 4-0 Monocryl and Dermabond.  All sponge and needle counts were correct at the end of the case.    Ivar Drape, MD General, Bariatric, & Minimally Invasive Surgery [facility], [place]

## 2020-12-21 NOTE — Transfer of Care (Signed)
Immediate Anesthesia Transfer of Care Note  Patient: Harjit L Gledhill  Procedure(s) Performed: ROBOTIC SLEEVE GASTRECTOMY CONVESION TO ROBOTIC GASTRIC BYPASS, HIATAL HERNIA REPAIR WITH UPPER GASTROINTESTINAL ENDOSCOPY (Abdomen) HERNIA REPAIR HIATAL  Patient Location: PACU  Anesthesia Type:General  Level of Consciousness: drowsy  Airway & Oxygen Therapy: Patient Spontanous Breathing and Patient connected to face mask oxygen  Post-op Assessment: Report given to RN and Post -op Vital signs reviewed and stable  Post vital signs: Reviewed and stable  Last Vitals:  Vitals Value Taken Time  BP 109/67 12/21/20 1220  Temp    Pulse 71 12/21/20 1222  Resp 13 12/21/20 1222  SpO2 100 % 12/21/20 1222  Vitals shown include unvalidated device data.  Last Pain:  Vitals:   12/21/20 0625  TempSrc: Oral  PainSc:          Complications: No notable events documented.

## 2020-12-21 NOTE — Anesthesia Procedure Notes (Signed)
Procedure Name: Intubation Date/Time: 12/21/2020 7:32 AM Performed by: Raenette Rover, CRNA Pre-anesthesia Checklist: Patient identified, Emergency Drugs available, Suction available and Patient being monitored Patient Re-evaluated:Patient Re-evaluated prior to induction Oxygen Delivery Method: Circle system utilized Preoxygenation: Pre-oxygenation with 100% oxygen Induction Type: IV induction Ventilation: Mask ventilation without difficulty Laryngoscope Size: Mac and 3 Grade View: Grade I Tube type: Oral Tube size: 7.0 mm Number of attempts: 1 Airway Equipment and Method: Stylet Placement Confirmation: ETT inserted through vocal cords under direct vision, positive ETCO2 and breath sounds checked- equal and bilateral Secured at: 21 cm Tube secured with: Tape Dental Injury: Teeth and Oropharynx as per pre-operative assessment

## 2020-12-21 NOTE — Anesthesia Postprocedure Evaluation (Signed)
Anesthesia Post Note  Patient: Rebecca Russo  Procedure(s) Performed: ROBOTIC SLEEVE GASTRECTOMY CONVESION TO ROBOTIC GASTRIC BYPASS, HIATAL HERNIA REPAIR, AND PRIMARY UMBILICAL HERNIA REPAIR WITH UPPER GASTROINTESTINAL ENDOSCOPY (Abdomen) HERNIA REPAIR HIATAL     Patient location during evaluation: PACU Anesthesia Type: General Level of consciousness: awake and alert Pain management: pain level controlled Vital Signs Assessment: post-procedure vital signs reviewed and stable Respiratory status: spontaneous breathing, nonlabored ventilation, respiratory function stable and patient connected to nasal cannula oxygen Cardiovascular status: blood pressure returned to baseline and stable Postop Assessment: no apparent nausea or vomiting Anesthetic complications: no   No notable events documented.  Last Vitals:  Vitals:   12/21/20 1707 12/21/20 1806  BP: 100/85 112/73  Pulse: 67 (!) 53  Resp: 18   Temp: 36.6 C 36.4 C  SpO2: 100% 100%    Last Pain:  Vitals:   12/21/20 1833  TempSrc:   PainSc: 10-Worst pain ever                 Kennieth Rad

## 2020-12-22 ENCOUNTER — Encounter (HOSPITAL_COMMUNITY): Payer: Self-pay | Admitting: Surgery

## 2020-12-22 LAB — CBC WITH DIFFERENTIAL/PLATELET
Abs Immature Granulocytes: 0.1 10*3/uL — ABNORMAL HIGH (ref 0.00–0.07)
Basophils Absolute: 0 10*3/uL (ref 0.0–0.1)
Basophils Relative: 0 %
Eosinophils Absolute: 0 10*3/uL (ref 0.0–0.5)
Eosinophils Relative: 0 %
HCT: 30.3 % — ABNORMAL LOW (ref 36.0–46.0)
Hemoglobin: 10.1 g/dL — ABNORMAL LOW (ref 12.0–15.0)
Immature Granulocytes: 1 %
Lymphocytes Relative: 14 %
Lymphs Abs: 2 10*3/uL (ref 0.7–4.0)
MCH: 32.2 pg (ref 26.0–34.0)
MCHC: 33.3 g/dL (ref 30.0–36.0)
MCV: 96.5 fL (ref 80.0–100.0)
Monocytes Absolute: 1 10*3/uL (ref 0.1–1.0)
Monocytes Relative: 7 %
Neutro Abs: 10.9 10*3/uL — ABNORMAL HIGH (ref 1.7–7.7)
Neutrophils Relative %: 78 %
Platelets: 285 10*3/uL (ref 150–400)
RBC: 3.14 MIL/uL — ABNORMAL LOW (ref 3.87–5.11)
RDW: 12.7 % (ref 11.5–15.5)
WBC: 14 10*3/uL — ABNORMAL HIGH (ref 4.0–10.5)
nRBC: 0 % (ref 0.0–0.2)

## 2020-12-22 MED ORDER — OXYCODONE HCL 5 MG/5ML PO SOLN
5.0000 mg | ORAL | Status: DC | PRN
  Administered 2020-12-22 – 2020-12-23 (×3): 10 mg via ORAL
  Filled 2020-12-22 (×3): qty 10

## 2020-12-22 NOTE — Progress Notes (Signed)
Nutrition Education Note  Received consult for diet education for patient s/p bariatric surgery. POD #1 conversion of sleeve gastrectomy to roux-en-y gastric bypass, hiatal hernia repair, and umbilical hernia repair.   Patient underwent sleeve gastrectomy on 07/29/18. She reports that in the first year post-op she was vomiting with nearly all PO intakes and tried changing up what she was consuming as she thought what she was consuming was simply not agreeing with her. Over the past 1 year this experience worsened. More recently, she was only consuming liquids.   She has continued to consume protein shakes and to take her multivitamins and minerals.   Discussed 2 week post op diet with pt. Emphasized that liquids must be non-carbonated, non-caffeinated, and sugar free. Fluid goals discussed. Pt to follow up with outpatient bariatric RD for further diet progression after 2 weeks. Multivitamins and minerals also reviewed. Teach back method used, pt expressed understanding, expect good compliance.  If nutrition issues arise, please re-consult RD.     Trenton Gammon, MS, RD, LDN, CNSC Inpatient Clinical Dietitian RD pager # available in AMION  After hours/weekend pager # available in [facility]

## 2020-12-22 NOTE — Discharge Instructions (Signed)
   GASTRIC BYPASS/SLEEVE  Home Care Instructions   These instructions are to help you care for yourself when you go home.  Call: If you have any problems. Call [phone] and ask for the surgeon on call If you need immediate help, come to the ER at [facility].  Tell the ER staff that you are a new post-op gastric bypass or gastric sleeve patient   Signs and symptoms to report: Severe vomiting or nausea If you cannot keep down clear liquids for longer than 1 day, call your surgeon  Abdominal pain that does not get better after taking your pain medication Fever over 100.4 F with chills Heart beating over 100 beats a minute Shortness of breath at rest Chest pain  Redness, swelling, drainage, or foul odor at incision (surgical) sites  If your incisions open or pull apart Swelling or pain in calf (lower leg) Diarrhea (Loose bowel movements that happen often), frequent watery, uncontrolled bowel movements Constipation, (no bowel movements for 3 days) if this happens: Pick one Milk of Magnesia, 2 tablespoons by mouth, 3 times a day for 2 days if needed Stop taking Milk of Magnesia once you have a bowel movement Call your doctor if constipation continues Or Miralax  (instead of Milk of Magnesia) following the label instructions Stop taking Miralax once you have a bowel movement Call your doctor if constipation continues Anything you think is not normal   Normal side effects after surgery: Unable to sleep at night or unable to focus Irritability or moody Being tearful (crying) or depressed These are common complaints, possibly related to your anesthesia medications that put you to sleep, stress of surgery, and change in lifestyle.  This usually goes away a few weeks after surgery.  If these feelings continue, call your primary care doctor.   Wound Care: You may have surgical glue, steri-strips, or staples over your incisions after surgery Surgical glue:  Looks like a clear film  over your incisions and will wear off a little at a time Steri-strips: Strips of tape over your incisions. You may notice a yellowish color on the skin under the steri-strips. This is used to make the   steri-strips stick better. Do not pull the steri-strips off - let them fall off Staples: Staples may be removed before you leave the hospital If you go home with staples, call [facility], [phone] at for an appointment with your surgeon's nurse to have staples removed 10 days after surgery. Showering: You may shower two (2) days after your surgery unless your surgeon tells you differently Wash gently around incisions with warm soapy water, rinse well, and gently pat dry  No tub baths until staples are removed, steri-strips fall off or glue is gone.    Medications: Medications should be liquid or crushed if larger than the size of a dime Extended release pills (medication that release a little bit at a time through the day) should NOT be crushed or cut. (examples include XL, ER, DR, SR) Depending on the size and number of medications you take, you may need to space (take a few throughout the day)/change the time you take your medications so that you do not over-fill your pouch (smaller stomach) Make sure you follow-up with your primary care doctor to make medication changes needed during rapid weight loss and life-style changes If you have diabetes, follow up with the doctor that orders your diabetes medication(s) within one week after surgery and check your blood sugar regularly.   Do not drive while taking prescription pain medication  It is ok to take Tylenol by the bottle instructions with your pain medicine or instead of your pain medicine as needed.  DO NOT TAKE NSAIDS (EXAMPLES OF NSAIDS:  IBUPROFREN/ NAPROXEN)  Diet:                    First 2 Weeks  You will see the dietician t about two (2) weeks after your surgery. The dietician will increase the types of foods you can eat  if you are handling liquids well: If you have severe vomiting or nausea and cannot keep down clear liquids lasting longer than 1 day, call your surgeon @ ([phone]) Protein Shake Drink at least 2 ounces of shake 5-6 times per day Each serving of protein shakes (usually 8 - 12 ounces) should have: 15 grams of protein  And no more than 5 grams of carbohydrate  Goal for protein each day: Men = 80 grams per day Women = 60 grams per day Protein powder may be added to fluids such as non-fat milk or Lactaid milk or unsweetened Soy/Almond milk (limit to 35 grams added protein powder per serving)  Hydration Slowly increase the amount of water and other clear liquids as tolerated (See Acceptable Fluids) Slowly increase the amount of protein shake as tolerated   Sip fluids slowly and throughout the day.  Do not use straws. May use sugar substitutes in small amounts (no more than 6 - 8 packets per day; i.e. Splenda)  Fluid Goal The first goal is to drink at least 8 ounces of protein shake/drink per day (or as directed by the nutritionist); some examples of protein shakes are Syntrax Nectar, Adkins Advantage, EAS Edge HP, and Unjury. See handout from pre-op Bariatric Education Class: Slowly increase the amount of protein shake you drink as tolerated You may find it easier to slowly sip shakes throughout the day It is important to get your proteins in first Your fluid goal is to drink 64 - 100 ounces of fluid daily It may take a few weeks to build up to this 32 oz (or more) should be clear liquids  And  32 oz (or more) should be full liquids (see below for examples) Liquids should not contain sugar, caffeine, or carbonation  Clear Liquids: Water or Sugar-free flavored water (i.e. Fruit H2O, Propel) Decaffeinated coffee or tea (sugar-free) Crystal Lite, Wyler's Lite, Minute Maid Lite Sugar-free Jell-O Bouillon or broth Sugar-free Popsicle:   *Less than 20 calories each; Limit 1 per  day  Full Liquids: Protein Shakes/Drinks + 2 choices per day of other full liquids Full liquids must be: No More Than 15 grams of Carbs per serving  No More Than 3 grams of Fat per serving Strained low-fat cream soup (except Cream of Potato or Tomato) Non-Fat milk Fat-free Lactaid Milk Unsweetened Soy Or Unsweetened Almond Milk Low Sugar yogurt (Dannon Lite & Fit, Greek yogurt; Oikos Triple Zero; Chobani Simply 100; Yoplait 100 calorie Greek - No Fruit on the Bottom)    Vitamins and Minerals Start 1 day after surgery unless otherwise directed by your surgeon 2 Chewable Bariatric Specific Multivitamin / Multimineral Supplement with iron (Example: Bariatric Advantage Multi EA) Chewable Calcium with Vitamin D-3 (Example: 3 Chewable Calcium Plus 600 with Vitamin D-3) Take 500 mg three (3) times a day for a total of 1500 mg each day Do not take all 3 doses of calcium at one time as it may cause constipation, and   you can only absorb 500 mg  at a time  Do not mix multivitamins containing iron with calcium supplements; take 2 hours apart Menstruating women and those with a history of anemia (a blood disease that causes weakness) may need extra iron Talk with your doctor to see if you need more iron Do not stop taking or change any vitamins or minerals until you talk to your dietitian or surgeon Your Dietitian and/or surgeon must approve all vitamin and mineral supplements   Activity and Exercise: Limit your physical activity as instructed by your doctor.  It is important to continue walking at home.  During this time, use these guidelines: Do not lift anything greater than ten (10) pounds for at least two (2) weeks Do not go back to work or drive until your surgeon says you can You may have sex when you feel comfortable  It is VERY important for female patients to use a reliable birth control method; fertility often increases after surgery  All hormonal birth control will be ineffective for 30  days after surgery due to medications given during surgery a barrier method must be used. Do not get pregnant for at least 18 months Start exercising as soon as your doctor tells you that you can Make sure your doctor approves any physical activity Start with a simple walking program Walk 5-15 minutes each day, 7 days per week.  Slowly increase until you are walking 30-45 minutes per day Consider joining our BELT program. [phone] or email belt@[email or web address]   Special Instructions Things to remember: Use your CPAP when sleeping if this applies to you  [facility] has two free Bariatric Surgery Support Groups that meet monthly The 3rd Thursday of each month, 6 pm, [facility] Class[facility]  The 2nd Friday of each month, 11:45 am in the private dining room in the basement of [place] It is very important to keep all follow up appointments with your surgeon, dietitian, primary care physician, and behavioral health practitioner Routine follow up schedule with your surgeon include appointments at 2-3 weeks, 6-8 weeks, 6 months, and 1 year at a minimum.  Your surgeon may request to see you more often.   After the first year, please follow up with your bariatric surgeon and dietitian at least once a year in order to maintain best weight loss results [facility]: [phone] [facility]: [phone] Bariatric Nurse Coordinator: [phone]      Reviewed and Endorsed  by [facility] Patient Education Committee, June, 2016 Edits Approved: Aug, 2018    

## 2020-12-22 NOTE — Progress Notes (Signed)
Progress Note: General Surgery Service   Chief Complaint/Subjective: Doing okay postop day 1.  Main regurgitation swallowing issue already feels better.  Objective: Vital signs in last 24 hours: Temp:  [97.6 F (36.4 C)-99.2 F (37.3 C)] 98.3 F (36.8 C) (08/10 1728) Pulse Rate:  [52-65] 65 (08/10 1728) Resp:  [15-18] 18 (08/10 1728) BP: (108-128)/(65-76) 128/72 (08/10 1728) SpO2:  [96 %-100 %] 96 % (08/10 1728) Last BM Date: 12/21/20  Intake/Output from previous day: 08/09 0701 - 08/10 0700 In: 4108 [P.O.:690; I.V.:3318; IV Piggyback:100] Out: 800 [Urine:750; Blood:50] Intake/Output this shift: Total I/O In: 854.7 [P.O.:180; I.V.:674.7] Out: 250 [Urine:250]  Constitutional: NAD; conversant; no deformities Eyes: Moist conjunctiva; no lid lag; anicteric; PERRL Neck: Trachea midline; no thyromegaly Lungs: Normal respiratory effort; no tactile fremitus CV: RRR; no palpable thrills; no pitting edema GI: Abd Soft, tender around incisions, incisions c/d/I w/ glue; no palpable hepatosplenomegaly MSK: Normal range of motion of extremities; no clubbing/cyanosis Psychiatric: Appropriate affect; alert and oriented x3 Lymphatic: No palpable cervical or axillary lymphadenopathy  Lab Results: CBC  Recent Labs    12/21/20 1258 12/21/20 1529 12/22/20 0426  WBC 14.6*  --  14.0*  HGB 11.8* 12.2 10.1*  HCT 37.2 37.1 30.3*  PLT 327  --  285   BMET Recent Labs    12/21/20 1602  CREATININE 0.95   PT/INR No results for input(s): LABPROT, INR in the last 72 hours. ABG No results for input(s): PHART, HCO3 in the last 72 hours.  Invalid input(s): PCO2, PO2  Anti-infectives: Anti-infectives (From admission, onward)    Start     Dose/Rate Route Frequency Ordered Stop   12/21/20 0600  cefoTEtan (CEFOTAN) 2 g in sodium chloride 0.9 % 100 mL IVPB        2 g 200 mL/hr over 30 Minutes Intravenous On call to O.R. 12/21/20 0524 12/21/20 0806       Medications: Scheduled Meds:   acetaminophen  1,000 mg Oral Q8H   Or   acetaminophen (TYLENOL) oral liquid 160 mg/5 mL  1,000 mg Oral Q8H   enoxaparin (LOVENOX) injection  30 mg Subcutaneous Q12H   pantoprazole (PROTONIX) IV  40 mg Intravenous QHS   Ensure Max Protein  2 oz Oral Q2H   Continuous Infusions:  lactated ringers 75 mL/hr at 12/21/20 1620   PRN Meds:.HYDROmorphone (DILAUDID) injection, ondansetron (ZOFRAN) IV, oxyCODONE  Assessment/Plan: s/p Procedure(s): ROBOTIC SLEEVE GASTRECTOMY CONVESION TO ROBOTIC GASTRIC BYPASS, HIATAL HERNIA REPAIR, AND PRIMARY UMBILICAL HERNIA REPAIR WITH UPPER GASTROINTESTINAL ENDOSCOPY HERNIA REPAIR HIATAL 12/21/2020  Doing well post op day 1 Pain control issues, still needing some IV pain medication Plan for discharge first thing in the morning.   LOS: 1 day     Quentin Ore, MD  [facility] Use [email or web address] to contact on call provider

## 2020-12-22 NOTE — Progress Notes (Signed)
Patient alert and oriented, pain is controlled. Patient is tolerating fluids, advanced to protein shake today, patient is tolerating well. Reviewed Gastric Bypass discharge instructions with patient and patient is able to articulate understanding. Provided information on BELT program, Support Group and [facility] outpatient pharmacy. All questions answered, will continue to monitor.    

## 2020-12-23 ENCOUNTER — Other Ambulatory Visit (HOSPITAL_COMMUNITY): Payer: Self-pay

## 2020-12-23 LAB — CBC WITH DIFFERENTIAL/PLATELET
Abs Immature Granulocytes: 0.09 10*3/uL — ABNORMAL HIGH (ref 0.00–0.07)
Basophils Absolute: 0.1 10*3/uL (ref 0.0–0.1)
Basophils Relative: 1 %
Eosinophils Absolute: 0.1 10*3/uL (ref 0.0–0.5)
Eosinophils Relative: 1 %
HCT: 30 % — ABNORMAL LOW (ref 36.0–46.0)
Hemoglobin: 9.8 g/dL — ABNORMAL LOW (ref 12.0–15.0)
Immature Granulocytes: 1 %
Lymphocytes Relative: 28 %
Lymphs Abs: 3 10*3/uL (ref 0.7–4.0)
MCH: 32 pg (ref 26.0–34.0)
MCHC: 32.7 g/dL (ref 30.0–36.0)
MCV: 98 fL (ref 80.0–100.0)
Monocytes Absolute: 0.8 10*3/uL (ref 0.1–1.0)
Monocytes Relative: 7 %
Neutro Abs: 6.8 10*3/uL (ref 1.7–7.7)
Neutrophils Relative %: 62 %
Platelets: 248 10*3/uL (ref 150–400)
RBC: 3.06 MIL/uL — ABNORMAL LOW (ref 3.87–5.11)
RDW: 13 % (ref 11.5–15.5)
WBC: 10.8 10*3/uL — ABNORMAL HIGH (ref 4.0–10.5)
nRBC: 0 % (ref 0.0–0.2)

## 2020-12-23 MED ORDER — OXYCODONE HCL 5 MG PO TABS
5.0000 mg | ORAL_TABLET | Freq: Four times a day (QID) | ORAL | 0 refills | Status: DC | PRN
  Filled 2020-12-23: qty 20, 5d supply, fill #0

## 2020-12-23 MED ORDER — ONDANSETRON 4 MG PO TBDP
4.0000 mg | ORAL_TABLET | Freq: Four times a day (QID) | ORAL | 0 refills | Status: DC | PRN
  Filled 2020-12-23: qty 20, 5d supply, fill #0

## 2020-12-23 MED ORDER — ACETAMINOPHEN 500 MG PO TABS: 1000.0000 mg | ORAL_TABLET | Freq: Three times a day (TID) | ORAL | 0 refills | Status: AC

## 2020-12-23 MED ORDER — PANTOPRAZOLE SODIUM 40 MG PO TBEC
40.0000 mg | DELAYED_RELEASE_TABLET | Freq: Every day | ORAL | 0 refills | Status: DC
  Filled 2020-12-23: qty 90, 90d supply, fill #0

## 2020-12-23 MED ORDER — GABAPENTIN 100 MG PO CAPS
200.0000 mg | ORAL_CAPSULE | Freq: Two times a day (BID) | ORAL | 0 refills | Status: DC
  Filled 2020-12-23: qty 20, 5d supply, fill #0

## 2020-12-23 NOTE — Discharge Summary (Signed)
Patient ID: Rebecca Russo [id number] y.o. 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   Deficiency anemia 08/23/2020   Encounter for general adult medical examination with abnormal findings 08/23/2020   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   Morbid obesity with body mass index (BMI) of 50.0 to 59.9 in adult [facility]) 07/29/2018   Chronic systolic heart failure (HCC) 06/09/2016   Depression 02/02/2015   Obesity 06/04/2014   Dysphagia, pharyngoesophageal phase 06/04/2014   PFO with atrial septal aneurysm 06/04/2014   Hyperlipidemia LDL goal <70 06/04/2014   Ischemic brain damage    Cerebrovascular accident (CVA) due to embolism of left middle cerebral artery (HCC) 05/29/2014   Aphasia complicating stroke 05/29/2014   Hemiplegia affecting dominant side (HCC) 05/29/2014   CONSTIPATION 03/09/2008   IRRITABLE BOWEL SYNDROME 03/09/2008     Discharge Diagnoses: GERD Patient Active Problem List   Diagnosis Date Noted   GERD (gastroesophageal reflux disease) 12/21/2020   Thiamine deficiency 08/27/2020   Hyperglycemia 08/24/2020   Colon cancer screening 08/24/2020   Vitamin D deficiency 08/24/2020   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   Deficiency anemia 08/23/2020   Encounter for general adult medical examination with abnormal findings 08/23/2020   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   Morbid obesity with body mass index (BMI) of 50.0 to 59.9 in adult [facility]) 07/29/2018   Chronic systolic heart failure (HCC)  76/16/0737   Depression 02/02/2015   Obesity 06/04/2014   Dysphagia, pharyngoesophageal phase 06/04/2014   PFO with atrial septal aneurysm 06/04/2014   Hyperlipidemia LDL goal <70 06/04/2014   Ischemic brain damage    Cerebrovascular accident (CVA) 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 Pryce underwent elective robotic conversion of sleeve gastrectomy to gastric bypass with hiatal hernia repair on 12/21/20.  She recovered well and was discharged.  Consults: None  Significant Diagnostic Studies: none  Treatments: Surgery  Disposition: Home  Patient Instructions:  Allergies as of 12/23/2020   No Known Allergies      Medication List     TAKE these medications    acetaminophen 500 MG tablet Commonly known as: TYLENOL Take 2 tablets (1,000 mg total) by mouth every 8 (eight) hours for 5 days.   gabapentin 100 MG capsule Commonly known as: NEURONTIN Take 2 capsules (200 mg total) by mouth every 12 (twelve) hours.   Mirena (52 MG) 20 MCG/DAY Iud Generic drug: levonorgestrel 1 each by Intrauterine route once.   multivitamin with minerals tablet Take 1 tablet by mouth daily.   ondansetron 4 MG disintegrating tablet Commonly known as: ZOFRAN-ODT Take 1 tablet (4 mg total) by mouth every 6 (six) hours as needed for nausea or vomiting.   oxyCODONE 5 MG immediate release tablet Commonly  known as: Oxy IR/ROXICODONE Take 1 tablet (5 mg total) by mouth every 6 (six) hours as  needed for severe pain.   pantoprazole 40 MG tablet Commonly known as: PROTONIX Take 1 tablet (40 mg total) by mouth daily.       ASK your doctor about these medications    Cholecalciferol 125 MCG (5000 UT) capsule Take 1 capsule (5,000 Units total) by mouth daily.   ondansetron 4 MG tablet Commonly known as: ZOFRAN TAKE 1 TABLET BY MOUTH EVERY 4-6 HOURS AS NEEDED   thiamine 50 MG tablet Commonly known as: VITAMIN B-1 Take 1 tablet (50 mg total) by mouth daily.        Activity: no heavy lifting for 4 weeks Diet:  bariatric diet protocol Wound Care: keep wound clean and dry  Follow-up:  With Dr. Dossie Der per bariatric follow up protocol  Signed: Hyman Hopes Kippy Gohman [facility]imally Invasive [facility]   12/23/2020, 6:38 AM

## 2020-12-23 NOTE — Progress Notes (Signed)
Discharge instructions given to patient and all questions were answered.  

## 2020-12-24 ENCOUNTER — Other Ambulatory Visit: Payer: Self-pay | Admitting: *Deleted

## 2020-12-24 ENCOUNTER — Other Ambulatory Visit (HOSPITAL_COMMUNITY): Payer: Self-pay

## 2020-12-24 NOTE — Patient Outreach (Signed)
[facility]) Care Management  12/24/2020  Rebecca Russo 07-31-1975 [id number]   Transition of care telephone call  Referral received:12/21/20 Initial outreach:12/24/20 Insurance: [facility]   Initial unsuccessful telephone call to patient's preferred number in order to complete transition of care assessment; no answer, left HIPAA compliant voicemail message requesting return call.   Objective: Per the electronic medical record, Rebecca Russo was hospitalized at [facility] 8/9-8/11/22 for  Comorbidities include: GERD,CVA, laparoscopic sleeve gastrectomy . She was discharged to home on 12/23/20 without the need for home health services or durable medical equipment per the discharge summary.   Plan: This RNCM will route unsuccessful outreach letter with [facility] Care Management pamphlet and 24 hour Nurse Advice Line Magnet to [employer] Care Management clinical pool to be mailed to patient's home address. This RNCM will attempt another outreach within 4 business days.   Egbert Garibaldi, RN, BSN  [facility] Care Management,Care Management Coordinator  [phone]- Mobile [phone]- Toll Free Main Office

## 2020-12-27 ENCOUNTER — Telehealth: Payer: Self-pay

## 2020-12-27 NOTE — Telephone Encounter (Signed)
New message   Patient contact to be seen by her PCP.   Upcoming appt on  05/11/21

## 2020-12-28 ENCOUNTER — Telehealth (HOSPITAL_COMMUNITY): Payer: Self-pay | Admitting: *Deleted

## 2020-12-28 NOTE — Telephone Encounter (Signed)
Patient called to discuss post bariatric surgery follow up questions.  See below:   1.  Tell me about your pain and pain management? It's been hard to manage (states severe at times).  She called Dr. Christain [place] today, He advised her to call tomorrow if not better.  It's been hard to sleep.  Pain is all over belly  2.  Let's talk about fluid intake.  How much total fluid are you taking in? Only got in 1 1/2 shakes yesterday.  Drank broth and water.  Stressed importance of protein first and working towards 64oz daily.  Thinks she's gotten in about 35-40 oz. She'll update Dr. Kathie Rhodes when she calls tomorrow  3.  How much protein have you taken in the last 2 days? About 45 oz daily.  See note above  4.  Have you had nausea?  Tell me about when have experienced nausea and what you did to help? No nausea  5.  Has the frequency or color changed with your urine? Mostly light yellow  6.  Tell me what your incisions look like? No redness, drng or swelling  7.  Have you been passing gas? BM? Had several bms and passing gas  8.  If a problem or question were to arise who would you call?  Do you know contact numbers for [facility], [facility], and [facility]? yes  9.  How has the walking going? [place] every couple of hours  10.  How are your vitamins and calcium going?  How are you taking them? Taking as directed

## 2020-12-29 ENCOUNTER — Other Ambulatory Visit: Payer: Self-pay | Admitting: *Deleted

## 2020-12-29 NOTE — Patient Outreach (Signed)
[facility]) Care Management  12/29/2020  Rebecca Russo 1976-03-30 [id number]   Transition of care call Referral received: 12/21/20 Initial outreach attempt: 12/24/20 Insurance: [employer]  #2 Outreach Attempt  2nd unsuccessful telephone call to patient's preferred contact number in order to complete post hospital discharge transition of care assessment , no answer left HIPAA compliant message requesting return call.    Objective: Per the electronic medical record, Rebecca Russo was hospitalized at [facility] 8/9-8/11/22 for  Robotic sleeve gastrectomy conversion to Robotic gastric bypass, hiatal hernia repair and primary umbilical hernia repair. Comorbidities include: GERD,CVA, laparoscopic sleeve gastrectomy . She was discharged to home on 12/23/20 without the need for home health services or durable medical equipment per the discharge summary.    Plan If no return call from patient will attempt 3rd outreach in the next 4 business days.   Egbert Garibaldi, RN, BSN  [facility] Care Management,Care Management Coordinator  [phone]- Mobile [phone]- Toll Free Main Office

## 2020-12-30 ENCOUNTER — Other Ambulatory Visit (HOSPITAL_COMMUNITY): Payer: Self-pay

## 2020-12-30 MED ORDER — METHOCARBAMOL 500 MG PO TABS
500.0000 mg | ORAL_TABLET | Freq: Four times a day (QID) | ORAL | 0 refills | Status: AC
  Filled 2020-12-30: qty 40, 10d supply, fill #0

## 2020-12-30 MED ORDER — OXYCODONE-ACETAMINOPHEN 5-325 MG PO TABS
1.0000 | ORAL_TABLET | Freq: Four times a day (QID) | ORAL | 0 refills | Status: DC | PRN
  Filled 2020-12-30: qty 15, 4d supply, fill #0

## 2021-01-03 ENCOUNTER — Other Ambulatory Visit: Payer: Self-pay | Admitting: *Deleted

## 2021-01-03 NOTE — Patient Outreach (Signed)
[facility]) Care Management  01/03/2021  Rebecca Russo 1976-01-18 [id number]   Transition of care call Referral received: 12/21/20 Initial outreach attempt:12/24/20 Insurance: [place] [employer]   Third unsuccessful telephone call to patient's preferred contact number in order to complete post hospital discharge transition of care assessment; no answer,unable to leave a message    Objective: Per the electronic medical record, Rebecca Russo was hospitalized at [facility] 8/9-8/11/22 for  Robotic sleeve gastrectomy conversion to Robotic gastric bypass, hiatal hernia repair and primary umbilical hernia repair. Comorbidities include: GERD,CVA, laparoscopic sleeve gastrectomy . She was discharged to home on 12/23/20 without the need for home health services or durable medical equipment per the discharge summary.    Plan: If no return call from patient, will plan return call in the next 3 weeks.     Egbert Garibaldi, RN, BSN  [facility] Care Management,Care Management Coordinator  [phone]- Mobile [phone]- Toll Free Main Office

## 2021-01-04 ENCOUNTER — Encounter: Payer: 59 | Attending: Surgery | Admitting: Skilled Nursing Facility1

## 2021-01-04 ENCOUNTER — Other Ambulatory Visit: Payer: Self-pay

## 2021-01-04 DIAGNOSIS — Z6841 Body Mass Index (BMI) 40.0 and over, adult: Secondary | ICD-10-CM | POA: Insufficient documentation

## 2021-01-04 NOTE — Progress Notes (Signed)
2 Week Post-Operative Nutrition Class   Patient was seen on 01/04/2021 for Post-Operative Nutrition education at the [facility].   Pt states she had a lot of adhesions and 2 hernias corrected: dietitian advised she take her time advancing her diet and follow her own pace. Pt states she still is struggling with some abdominal pain.    Surgery date: 12/21/2020 Surgery type: sleeve to RYGB Start weight at [facility]: 273 pounds Weight today: 265.7 pounds Bowel Habits: Every day to every other day no complaints   Body Composition Scale 01/04/2021  Current Body Weight 265.7  Total Body Fat % 43.3  Visceral Fat 13  Fat-Free Mass % 56.6   Total Body Water % 42.8  Muscle-Mass lbs 36.4  BMI 38.1  Body Fat Displacement          Torso  lbs 71.4         Left Leg  lbs 14.2         Right Leg  lbs 14.2         Left Arm  lbs 7.1         Right Arm   lbs 7.1      The following the learning objectives were met by the patient during this course: Identifies Phase 3 (Soft, High Proteins) Dietary Goals and will begin from 2 weeks post-operatively to 2 months post-operatively Identifies appropriate sources of fluids and proteins  Identifies appropriate fat sources and healthy verses unhealthy fat types   States protein recommendations and appropriate sources post-operatively Identifies the need for appropriate texture modifications, mastication, and bite sizes when consuming solids Identifies appropriate fat consumption and sources Identifies appropriate multivitamin and calcium sources post-operatively Describes the need for physical activity post-operatively and will follow MD recommendations States when to call healthcare provider regarding medication questions or post-operative complications   Handouts given during class include: Phase 3A: Soft, High Protein Diet Handout Phase 3 High Protein Meals Healthy Fats   Follow-Up Plan: Patient will follow-up at [facility] in 6  weeks for 2 month post-op nutrition visit for diet advancement per MD.

## 2021-01-06 DIAGNOSIS — H5203 Hypermetropia, bilateral: Secondary | ICD-10-CM | POA: Diagnosis not present

## 2021-01-10 ENCOUNTER — Telehealth: Payer: Self-pay | Admitting: Skilled Nursing Facility1

## 2021-01-10 NOTE — Telephone Encounter (Signed)
RD called pt to verify fluid intake once starting soft, solid proteins 2 week post-bariatric surgery.   Daily Fluid intake: 64 ounces Daily Protein intake: 60 g Bowel Habits: daily to every other day  Concerns/issues:   Pt states she tried chicken and it came back up so she will stay on liquid until she sees her surgeon.   Dietitian asked if she tried any other solid proteins and pt states she did not due to fear.   Dietitian advised pt try soft proteins avoiding beef, chicken, pork.

## 2021-01-12 ENCOUNTER — Other Ambulatory Visit (HOSPITAL_COMMUNITY): Payer: Self-pay

## 2021-01-12 MED ORDER — OXYCODONE-ACETAMINOPHEN 5-325 MG PO TABS
1.0000 | ORAL_TABLET | Freq: Four times a day (QID) | ORAL | 0 refills | Status: DC | PRN
  Filled 2021-01-12: qty 10, 3d supply, fill #0

## 2021-01-24 ENCOUNTER — Other Ambulatory Visit: Payer: Self-pay | Admitting: *Deleted

## 2021-01-24 NOTE — Patient Outreach (Addendum)
[facility]) Care Management  01/24/2021  Rebecca Russo [birth year] [id number]   Transition of care /Case Closure Unsuccessful outreach    Referral received:12/21/20 Initial outreach:12/24/20 Insurance: [place] [employer]   #4 Call attempt  Unable to complete post hospital discharge transition of care assessment. No return call form patient after 3 call attempts and no response to request to contact RN Care Coordinator in unsuccessful outreach letter mailed to home on 12/24/20/.  Objective: Per the electronic medical record, Rebecca Russo was hospitalized at [facility] 8/9-8/11/22 for  Robotic sleeve gastrectomy conversion to Robotic gastric bypass, hiatal hernia repair and primary umbilical hernia repair. Comorbidities include: GERD,CVA, laparoscopic sleeve gastrectomy . She was discharged to home on 12/23/20 without the need for home health services or durable medical equipment per the discharge summary.  Plan Case closed to [facility] care management services unsuccessful outreach attempt x 4.   Egbert Garibaldi, RN, BSN  [facility] Care Management,Care Management Coordinator  [phone]- Mobile [phone]- Toll Free Main Office

## 2021-02-08 DIAGNOSIS — R1084 Generalized abdominal pain: Secondary | ICD-10-CM | POA: Diagnosis not present

## 2021-02-08 DIAGNOSIS — Z9884 Bariatric surgery status: Secondary | ICD-10-CM | POA: Diagnosis not present

## 2021-02-09 NOTE — Progress Notes (Signed)
GYNECOLOGY  VISIT   HPI: 45 y.o.   Single  African American  female   [phone] with No LMP recorded. (Menstrual status: IUD).   here for STD screening.   No discharge, pain or abnormal bleeding.   No particular known exposure other than partner had another partner.   GYNECOLOGIC HISTORY: No LMP recorded. (Menstrual status: IUD). Contraception: Mirena IUD 01-29-20 (IUD position confirmed by [place] 08/10/20.) Menopausal hormone therapy:  n/a Last mammogram: 08-13-20 3D/Diag Lt.w/US//small probably benign asymmetry Lt.br./follow up Diag.Lt w/poss.U/S in 6 months//BiRads3 Last pap smear: 09-19-17 Neg:Neg HR HPV           OB History     Gravida  2   Para  2   Term  2   Preterm      AB      Living  2      SAB      IAB      Ectopic      Multiple      Live Births  2              Patient Active Problem List   Diagnosis Date Noted   GERD (gastroesophageal reflux disease) 12/21/2020   Thiamine deficiency 08/27/2020   Hyperglycemia 08/24/2020   Colon cancer screening 08/24/2020   Vitamin D deficiency 08/24/2020   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   Deficiency anemia 08/23/2020   Encounter for general adult medical examination with abnormal findings 08/23/2020   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   Morbid obesity with body mass index (BMI) of 50.0 to 59.9 in adult (HCC) 07/29/2018   Chronic systolic heart failure (HCC) 06/09/2016   Depression 02/02/2015   Obesity 06/04/2014   Dysphagia, pharyngoesophageal phase 06/04/2014   PFO with atrial septal aneurysm 06/04/2014   Hyperlipidemia LDL goal <70 06/04/2014   Ischemic brain damage    Cerebrovascular accident (CVA) due to embolism of left middle cerebral artery (HCC) 05/29/2014   Aphasia complicating stroke 05/29/2014   Hemiplegia affecting dominant side (HCC) 05/29/2014   CONSTIPATION 03/09/2008   IRRITABLE BOWEL SYNDROME 03/09/2008    Past Medical History:  Diagnosis Date   Allergic  rhinoconjunctivitis 08/23/2020   Anemia    Aphasia complicating stroke    Bilateral swelling of feet    Chronic systolic heart failure (HCC) 06/09/2016   a - Prob nonischemic //  Echo 10/17: EF 40-45, diffuse HK, mild LAE, PFO closure device without residual shunt  //  Could not tol cMRI // Echo 4/18: EF 55, normal wall motion, normal diastolic function, trivial MR, PFO device in place without residual shunt, PASP 24   Constipation    Depression    Eczema    GERD (gastroesophageal reflux disease)    H/O varicella    Hemiplegia affecting dominant side (HCC)    History of ischemic left MCA stroke    weakness on right side   Increased BMI 12/17/2008   LAE (left atrial enlargement) 04/05/2018   Mod, noted on ECHO   Migraines    Morbid obesity (HCC)    PFO with atrial septal aneurysm 06/04/2014   a - s/p transcatheter closure in 2/16 with 25 mm Amplatzer Cribiform ASO device // b - Echo 10/17:  EF 40-45, diffuse HK, mild LAE, PFO closure device without residual shunt    Stroke (cerebrum) (HCC)    Vitamin D deficiency     Past Surgical History:  Procedure Laterality Date   CESAREAN SECTION  1999   COLON SURGERY  2004  partial colectomy   HIATAL HERNIA REPAIR N/A 12/21/2020   Procedure: HERNIA REPAIR HIATAL;  Surgeon: Quentin Ore, MD;  Location: [facility] ORS;  Service: General;  Laterality: N/A;   LAPAROSCOPIC CHOLECYSTECTOMY  06/20/2012   LAPAROSCOPIC GASTRIC SLEEVE RESECTION N/A 07/29/2018   Procedure: LAPAROSCOPIC GASTRIC SLEEVE RESECTION, UPPER ENDO, ERAS Pathway;  Surgeon: Glenna Fellows, MD;  Location: [facility]S;  Service: General;  Laterality: N/A;   PATENT FORAMEN OVALE CLOSURE  07/09/2014   PATENT FORAMEN OVALE CLOSURE N/A 07/09/2014   Procedure: PATENT FORAMEN OVALE CLOSURE;  Surgeon: Micheline Chapman, MD;  Location: [facility] CATH LAB;  Service: Cardiovascular;  Laterality: N/A;   RADIOLOGY WITH ANESTHESIA N/A 05/29/2014   Procedure: RADIOLOGY WITH ANESTHESIA;  Surgeon: Oneal Grout, MD;  Location: [facility] OR;  Service: Radiology;  Laterality: N/A;   TEE WITHOUT CARDIOVERSION N/A 06/03/2014   Procedure: TRANSESOPHAGEAL ECHOCARDIOGRAM (TEE);  Surgeon: Wendall Stade, MD;  Location: [facility] ENDOSCOPY;  Service: Cardiovascular;  Laterality: N/A;    Current Outpatient Medications  Medication Sig Dispense Refill   Cholecalciferol 125 MCG (5000 UT) capsule Take 1 capsule (5,000 Units total) by mouth daily. 90 capsule 1   gabapentin (NEURONTIN) 100 MG capsule Take 2 capsules (200 mg total) by mouth every 12 (twelve) hours. 20 capsule 0   levonorgestrel (MIRENA, 52 MG,) 20 MCG/24HR IUD 1 each by Intrauterine route once.     Multiple Vitamins-Minerals (MULTIVITAMIN WITH MINERALS) tablet Take 1 tablet by mouth daily.     pantoprazole (PROTONIX) 40 MG tablet Take 1 tablet (40 mg total) by mouth daily. 90 tablet 0   thiamine (VITAMIN B-1) 50 MG tablet Take 1 tablet (50 mg total) by mouth daily. 90 tablet 1   No current facility-administered medications for this visit.     ALLERGIES: Patient has no known allergies.  Family History  Problem Relation Age of Onset   Hypertension Mother    Diabetes Mother    Heart disease Mother    Sleep apnea Mother    Hypertension Father    Stroke Father    Diabetes Father    Heart disease Father    Stroke Paternal Grandfather    Stroke Maternal Grandfather    Cancer Other    Breast cancer Neg Hx    Colon cancer Neg Hx     Social History   Socioeconomic History   Marital status: Single    Spouse name: Not on file   Number of children: 2   Years of education: Associates   Highest education level: Associate degree: academic program  Occupational History   Occupation: Radiology Scheduler    Comment: [employer]  Tobacco Use   Smoking status: Never   Smokeless tobacco: Never  Vaping Use   Vaping Use: Never used  Substance and Sexual Activity   Alcohol use: No    Alcohol/week: 0.0 standard drinks   Drug use: No   Sexual activity: Yes     Birth control/protection: Abstinence  Other Topics Concern   Not on file  Social History Narrative   Patient is single with 2 children --> 25 and 20, both in college. Daughter is at [employer] studying nursing. Son at [employer].   Patient is right handed.   Patient has her Associates degree.   Patient drinks 1 soda a month.   Worked in radiology scheduling   Social Determinants of Health   Financial Resource Strain: Not on file  Food Insecurity: Not on file  Transportation Needs: Not on file  Physical Activity: Not on file  Stress: Not on file  Social Connections: Not on file  Intimate Partner Violence: Not on file    Review of Systems  All other systems reviewed and are negative.  PHYSICAL EXAMINATION:    BP 100/70   Pulse 70   Ht 5\' 9"  (1.753 m)   BMI 39.24 kg/m       Pelvic: External genitalia:  no lesions              Urethra:  normal appearing urethra with no masses, tenderness or lesions              Bartholins and Skenes: normal                 Vagina: normal appearing vagina with normal color and discharge, no lesions              Cervix: no lesions.  Mucousy bloody discharge from cervix.   IUD strings not seen.                 Bimanual Exam:  Uterus:  normal size, contour, position, consistency, mobility, non-tender              Adnexa: no mass, fullness, tenderness    Chaperone was present for exam:  , CMA  ASSESSMENT  STD screening.  Mirena IUD.  IUD confirmed by prior Marchelle Folks.   PLAN  CG/CT/trich/HIV, RPR, hep B and Hep C.  Condoms recommended.  We reviewed the IUD as a mechanism for an STD to travel into the upper genital track.  FU prn.    An After Visit Summary was printed and given to the patient.

## 2021-02-11 ENCOUNTER — Other Ambulatory Visit (HOSPITAL_COMMUNITY)
Admission: RE | Admit: 2021-02-11 | Discharge: 2021-02-11 | Disposition: A | Discharge status: Home / Self Care | Payer: 59 | Source: Ambulatory Visit | Attending: Obstetrics and Gynecology | Admitting: Obstetrics and Gynecology

## 2021-02-11 ENCOUNTER — Ambulatory Visit (INDEPENDENT_AMBULATORY_CARE_PROVIDER_SITE_OTHER): Payer: 59 | Admitting: Obstetrics and Gynecology

## 2021-02-11 ENCOUNTER — Other Ambulatory Visit: Payer: Self-pay

## 2021-02-11 ENCOUNTER — Encounter: Payer: Self-pay | Admitting: Obstetrics and Gynecology

## 2021-02-11 VITALS — BP 100/70 | HR 70 | Ht 69.0 in

## 2021-02-11 DIAGNOSIS — Z113 Encounter for screening for infections with a predominantly sexual mode of transmission: Secondary | ICD-10-CM | POA: Diagnosis not present

## 2021-02-14 LAB — HIV ANTIBODY (ROUTINE TESTING W REFLEX): HIV 1&2 Ab, 4th Generation: NONREACTIVE

## 2021-02-14 LAB — RPR: RPR Ser Ql: NONREACTIVE

## 2021-02-14 LAB — HEPATITIS C ANTIBODY
Hepatitis C Ab: NONREACTIVE
SIGNAL TO CUT-OFF: 0.01 (ref <1.00)

## 2021-02-14 LAB — HEPATITIS B SURFACE ANTIGEN: Hepatitis B Surface Ag: NONREACTIVE

## 2021-02-15 ENCOUNTER — Ambulatory Visit: Payer: 59 | Admitting: Skilled Nursing Facility1

## 2021-02-15 LAB — CERVICOVAGINAL ANCILLARY ONLY
Chlamydia: NEGATIVE
Comment: NEGATIVE
Comment: NEGATIVE
Comment: NORMAL
Neisseria Gonorrhea: NEGATIVE
Trichomonas: NEGATIVE

## 2021-03-08 ENCOUNTER — Other Ambulatory Visit: Payer: 59

## 2021-05-10 NOTE — Progress Notes (Deleted)
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 Keep headache diary Follow up ***   Subjective:  Kassadie L Swiney is a 45 year old right-handed African American woman with chronic systolic heart failure and history of left MCA infarct with residual expressive aphasia who follows up for ***  UPDATE: Current medications include:  Plavix, Crestor 10mg , furosemide 20mg , citalopram 20mg , Aimovig 70mg   Last seen in March 2020 for migraine.  Started Aimovig at that time.  ***.  Reyvow?   HISTORY: She has history of migraines, initially in mid to late 30s.  They would initially occur once in awhile but have increased in past 3 months.  Migraines start in right frontal region and spreads to the left side.  They usually are a pounding headache that is gradual, ranging from 5 to 10/10 in intensity.  No preceding aura or premonitory stage.  There is associated nausea, photophobia, phonophobia, osmophobia.  When severe, blurred vision.  No associated vomiting or unilateral numbness or weakness.  They last 1 to 3 days.  She has had 15 to 20 days in past 30 days.  There are no specific triggers.  Nothing relieves them. She was seen in the ED on 07/08/18 where she received IM Toradol and Decadron.    Past medications include:  tramadol, propranolol.  Unable to take NSAIDs as already on antiplatelet therapy.  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  Patient refused MRI but repeat CT scan was personally reviewed and revealed left MCA stroke and CT perfusion showed left brain penumbra.  CTA of head  and neck showed no emergent large vessel occlusion or stenosis.  Hypercoagulable workup negative.  TEE demonstrated large PFO with mobile atrial septal aneurysm.  She was discharged on ASA and Crestor for secondary stroke prevention. She underwent PFO closure on 07/09/14 and was on dual antiplatelet therapy for the next 6 months.  Following the stroke, she continues having trouble with getting words out, short term memory deficits (forgetting names) and has residual right hand numbness.     Most recent 2D echo from 04/05/18 showed EF 60-65% with PFO s/p 25 mm Amplatzer cribriform closure device with no residual shunt.     PAST MEDICAL HISTORY: 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   Anemia    Aphasia complicating stroke    Bilateral swelling of feet    Chronic systolic heart failure (HCC) 06/09/2016   a - Prob nonischemic //  Echo 10/17: EF 40-45, diffuse HK, mild LAE, PFO closure device without residual shunt  //  Could not tol cMRI // Echo 4/18: EF 55, normal wall motion, normal diastolic function, trivial MR, PFO device in place without residual shunt, PASP 24   Constipation    Depression    Eczema    GERD (gastroesophageal reflux disease)    H/O varicella    Hemiplegia affecting dominant side (HCC)    History of ischemic left MCA stroke    weakness on right side   Increased BMI 12/17/2008   LAE (left atrial enlargement) 04/05/2018   Mod, noted on ECHO  Migraines    Morbid obesity (HCC)    PFO with atrial septal aneurysm 06/04/2014   a - s/p transcatheter closure in 2/16 with 25 mm Amplatzer Cribiform ASO device // b - Echo 10/17:  EF 40-45, diffuse HK, mild LAE, PFO closure device without residual shunt    Stroke (cerebrum) (HCC)    Vitamin D deficiency     MEDICATIONS: Current Outpatient Medications on File Prior to Visit  Medication Sig Dispense Refill   calcium carbonate (TUMS - DOSED IN MG ELEMENTAL CALCIUM) 500 MG chewable tablet  Chew 1 tablet by mouth in the morning, at noon, and at bedtime.     Cholecalciferol 125 MCG (5000 UT) capsule Take 1 capsule (5,000 Units total) by mouth daily. (Patient not taking: Reported on 04/28/2021) 90 capsule 1   gabapentin (NEURONTIN) 100 MG capsule Take 2 capsules (200 mg total) by mouth every 12 (twelve) hours. (Patient not taking: Reported on 04/28/2021) 20 capsule 0   levonorgestrel (MIRENA, 52 MG,) 20 MCG/24HR IUD 1 each by Intrauterine route once.     Multiple Vitamins-Minerals (MULTIVITAMIN WITH MINERALS) tablet Take 1 tablet by mouth in the morning.     pantoprazole (PROTONIX) 40 MG tablet Take 1 tablet (40 mg total) by mouth daily. (Patient not taking: Reported on 04/28/2021) 90 tablet 0   thiamine (VITAMIN B-1) 50 MG tablet Take 1 tablet (50 mg total) by mouth daily. (Patient not taking: Reported on 04/28/2021) 90 tablet 1   No current facility-administered medications on file prior to visit.    ALLERGIES: No Known Allergies  FAMILY HISTORY: Family History  Problem Relation Age of Onset   Hypertension Mother    Diabetes Mother    Heart disease Mother    Sleep apnea Mother    Hypertension Father    Stroke Father    Diabetes Father    Heart disease Father    Stroke Paternal Grandfather    Stroke Maternal Grandfather    Cancer Other    Breast cancer Neg Hx    Colon cancer Neg Hx       Objective:  *** General: No acute distress.  Patient appears ***-groomed.   Head:  Normocephalic/atraumatic Eyes:  Fundi examined but not visualized Neck: supple, no paraspinal tenderness, full range of motion Heart:  Regular rate and rhythm Lungs:  Clear to auscultation bilaterally Back: No paraspinal tenderness Neurological Exam: alert and oriented to person, place, and time.  Speech fluent and not dysarthric, language intact.  CN II-XII intact. Bulk and tone normal, muscle strength 5/5 throughout.  Sensation to light touch intact.  Deep tendon reflexes 2+ throughout, toes  downgoing.  Finger to nose testing intact.  Gait normal, Romberg negative.   Metta Clines, DO  CC: ***

## 2021-05-11 ENCOUNTER — Ambulatory Visit: Payer: 59 | Admitting: Neurology

## 2021-06-23 ENCOUNTER — Encounter (HOSPITAL_COMMUNITY): Payer: Self-pay | Admitting: Surgery

## 2021-06-23 NOTE — Progress Notes (Signed)
Attempted to obtain medical history via telephone, unable to reach at this time. I left a voicemail to return pre surgical testing department's phone call.  

## 2021-07-01 ENCOUNTER — Encounter (HOSPITAL_COMMUNITY): Payer: Self-pay | Admitting: Registered Nurse

## 2021-07-01 ENCOUNTER — Encounter (HOSPITAL_COMMUNITY): Admission: RE | Payer: Self-pay | Source: Home / Self Care

## 2021-07-01 ENCOUNTER — Ambulatory Visit (HOSPITAL_COMMUNITY): Admission: RE | Admit: 2021-07-01 | Payer: 59 | Source: Home / Self Care | Admitting: Surgery

## 2021-07-01 SURGERY — EGD (ESOPHAGOGASTRODUODENOSCOPY)
Anesthesia: Monitor Anesthesia Care

## 2021-07-01 NOTE — Anesthesia Preprocedure Evaluation (Deleted)
Anesthesia Evaluation    Reviewed: Allergy & Precautions, Patient's Chart, lab work & pertinent test results  Airway Mallampati: III  TM Distance: >3 FB Neck ROM: Full    Dental  (+) Dental Advisory Given   Pulmonary neg pulmonary ROS,    breath sounds clear to auscultation       Cardiovascular +CHF   Rhythm:Regular Rate:Normal  IMPRESSIONS    1. Left ventricular ejection fraction, by estimation, is 60 to 65%. The  left ventricle has normal function. The left ventricle has no regional  wall motion abnormalities. Left ventricular diastolic parameters were  normal.  2. Right ventricular systolic function is normal. The right ventricular  size is normal. There is normal pulmonary artery systolic pressure.  3. PFO s/p 97m Amplatzer cribriform closure device 06/04/14. No residual  shunt.  4. The mitral valve is normal in structure. Trivial mitral valve  regurgitation. No evidence of mitral stenosis.  5. The aortic valve is normal in structure. Aortic valve regurgitation is  not visualized. No aortic stenosis is present.  6. The inferior vena cava is normal in size with greater than 50%  respiratory variability, suggesting right atrial pressure of 3 mmHg.   Comparison(s): 04/05/18 EF 50-55%. GLS -17.9%.    Neuro/Psych  Headaches, PSYCHIATRIC DISORDERS Depression CVA    GI/Hepatic Neg liver ROS, GERD  Medicated,  Endo/Other  Morbid obesity  Renal/GU negative Renal ROS     Musculoskeletal   Abdominal   Peds  Hematology  (+) Blood dyscrasia, anemia ,   Anesthesia Other Findings   Reproductive/Obstetrics                             Lab Results  Component Value Date   WBC 10.8 (H) 12/23/2020   HGB 9.8 (L) 12/23/2020   HCT 30.0 (L) 12/23/2020   MCV 98.0 12/23/2020   PLT 248 12/23/2020   Lab Results  Component Value Date   CREATININE 0.95 12/21/2020   BUN 13 12/10/2020   NA 138  12/10/2020   K 4.0 12/10/2020   CL 107 12/10/2020   CO2 24 12/10/2020   Echo 06/11/2020 1. Left ventricular ejection fraction, by estimation, is 60 to 65%. The left ventricle has normal function. The left ventricle has no regional wall motion abnormalities. Left ventricular diastolic parameters were normal.  2. Right ventricular systolic function is normal. The right ventricular size is normal. There is normal pulmonary artery systolic pressure.  3. PFO s/p 292mAmplatzer cribriform closure device 06/04/14. No residual shunt.  4. The mitral valve is normal in structure. Trivial mitral valve regurgitation. No evidence of mitral stenosis.  5. The aortic valve is normal in structure. Aortic valve regurgitation is not visualized. No aortic stenosis is present.  6. The inferior vena cava is normal in size with greater than 50% respiratory variability, suggesting right atrial pressure of 3 mmHg.  Anesthesia Physical  Anesthesia Plan  ASA: 3  Anesthesia Plan: MAC   Post-op Pain Management: Minimal or no pain anticipated   Induction: Intravenous  PONV Risk Score and Plan: 2 and Ondansetron, Midazolam and Propofol infusion  Airway Management Planned: Natural Airway, Nasal Cannula and Simple Face Mask  Additional Equipment:   Intra-op Plan:   Post-operative Plan:   Informed Consent:   Plan Discussed with: Anesthesiologist  Anesthesia Plan Comments:         Anesthesia Quick Evaluation

## 2021-07-28 NOTE — Progress Notes (Deleted)
46 y.o. [id number] Single African American female here for annual exam.   ? ?PCP: Sanda Linger, MD     ? ?No LMP recorded. (Menstrual status: IUD).     ?  ?    ?Sexually active: {yes no:314532}  ?The current method of family planning is IUD.    ?Exercising: {yes no:314532}  {types:19826} ?Smoker:  {YES NO:22349} ? ?Health Maintenance: ?Pap: 09/19/17-WNL, HPV-neg ?History of abnormal Pap:  no ?MMG: 08/13/20- birads0; Diag/US-birads 3 ?Colonoscopy: many years ago-Hx partial colectomy ?BMD:  n/a  Result  n/a ?TDaP: 2019 ?Gardasil: yes, completed ?HIV: 02/11/21-NR ?Hep C: 02/11/21-neg  ?Screening Labs:  Hb today: ***, Urine today: *** ? ? reports that she has never smoked. She has never used smokeless tobacco. She reports that she does not drink alcohol and does not use drugs. ? ?Past Medical History:  ?Diagnosis Date  ? 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  ? Anemia   ? Aphasia complicating stroke   ? Bilateral swelling of feet   ? Chronic systolic heart failure (HCC) 06/09/2016  ? a - Prob nonischemic //  Echo 10/17: EF 40-45, diffuse HK, mild LAE, PFO closure device without residual shunt  //  Could not tol cMRI // Echo 4/18: EF 55, normal wall motion, normal diastolic function, trivial MR, PFO device in place without residual shunt, PASP 24  ? Constipation   ? Depression   ? Eczema   ? GERD (gastroesophageal reflux disease)   ? H/O varicella   ? Hemiplegia affecting dominant side (HCC)   ? History of ischemic left MCA stroke   ? weakness on right side  ? Increased BMI 12/17/2008  ? LAE (left atrial enlargement) 04/05/2018  ? Mod, noted on ECHO  ? Migraines   ? Morbid obesity (HCC)   ? PFO with atrial septal aneurysm 06/04/2014  ? a - s/p transcatheter closure in 2/16 with 25 mm Amplatzer Cribiform ASO device // b - Echo 10/17:  EF 40-45, diffuse HK, mild LAE, PFO closure device without residual shunt   ? Stroke (cerebrum) (HCC)   ? Vitamin D deficiency   ? ? ?Past Surgical History:  ?Procedure Laterality Date  ? CESAREAN  SECTION  1999  ? COLON SURGERY  2004  ? partial colectomy  ? HIATAL HERNIA REPAIR N/A 12/21/2020  ? Procedure: HERNIA REPAIR HIATAL;  Surgeon: Quentin Ore, MD;  Location: [facility];  Service: General;  Laterality: N/A;  ? LAPAROSCOPIC CHOLECYSTECTOMY  06/20/2012  ? LAPAROSCOPIC GASTRIC SLEEVE RESECTION N/A 07/29/2018  ? Procedure: LAPAROSCOPIC GASTRIC SLEEVE RESECTION, UPPER ENDO, ERAS Pathway;  Surgeon: Glenna Fellows, MD;  Location: [facility];  Service: General;  Laterality: N/A;  ? PATENT FORAMEN OVALE CLOSURE  07/09/2014  ? PATENT FORAMEN OVALE CLOSURE N/A 07/09/2014  ? Procedure: PATENT FORAMEN OVALE CLOSURE;  Surgeon: Micheline Chapman, MD;  Location: [facility] CATH LAB;  Service: Cardiovascular;  Laterality: N/A;  ? RADIOLOGY WITH ANESTHESIA N/A 05/29/2014  ? Procedure: RADIOLOGY WITH ANESTHESIA;  Surgeon: Oneal Grout, MD;  Location: [facility];  Service: Radiology;  Laterality: N/A;  ? TEE WITHOUT CARDIOVERSION N/A 06/03/2014  ? Procedure: TRANSESOPHAGEAL ECHOCARDIOGRAM (TEE);  Surgeon: Wendall Stade, MD;  Location: [facility] ENDOSCOPY;  Service: Cardiovascular;  Laterality: N/A;  ? ? ?Current Outpatient Medications  ?Medication Sig Dispense Refill  ? calcium carbonate (TUMS - DOSED IN MG ELEMENTAL CALCIUM) 500 MG chewable tablet Chew 1 tablet by mouth in the morning, at noon, and at bedtime.    ? Cholecalciferol 125 MCG (5000  UT) capsule Take 1 capsule (5,000 Units total) by mouth daily. (Patient not taking: Reported on 04/28/2021) 90 capsule 1  ? gabapentin (NEURONTIN) 100 MG capsule Take 2 capsules (200 mg total) by mouth every 12 (twelve) hours. (Patient not taking: Reported on 04/28/2021) 20 capsule 0  ? levonorgestrel (MIRENA, 52 MG,) 20 MCG/24HR IUD 1 each by Intrauterine route once.    ? Multiple Vitamins-Minerals (MULTIVITAMIN WITH MINERALS) tablet Take 1 tablet by mouth in the morning.    ? pantoprazole (PROTONIX) 40 MG tablet Take 1 tablet (40 mg total) by mouth daily. (Patient not taking: Reported on  04/28/2021) 90 tablet 0  ? thiamine (VITAMIN B-1) 50 MG tablet Take 1 tablet (50 mg total) by mouth daily. (Patient not taking: Reported on 04/28/2021) 90 tablet 1  ? ?No current facility-administered medications for this visit.  ? ? ?Family History  ?Problem Relation Age of Onset  ? Hypertension Mother   ? Diabetes Mother   ? Heart disease Mother   ? Sleep apnea Mother   ? Hypertension Father   ? Stroke Father   ? Diabetes Father   ? Heart disease Father   ? Stroke Paternal Grandfather   ? Stroke Maternal Grandfather   ? Cancer Other   ? Breast cancer Neg Hx   ? Colon cancer Neg Hx   ? ? ?Review of Systems ? ?Exam:   ?There were no vitals taken for this visit.    ?General appearance: alert, cooperative and appears stated age ?Head: normocephalic, without obvious abnormality, atraumatic ?Neck: no adenopathy, supple, symmetrical, trachea midline and thyroid normal to inspection and palpation ?Lungs: clear to auscultation bilaterally ?Breasts: normal appearance, no masses or tenderness, No nipple retraction or dimpling, No nipple discharge or bleeding, No axillary adenopathy ?Heart: regular rate and rhythm ?Abdomen: soft, non-tender; no masses, no organomegaly ?Extremities: extremities normal, atraumatic, no cyanosis or edema ?Skin: skin color, texture, turgor normal. No rashes or lesions ?Lymph nodes: cervical, supraclavicular, and axillary nodes normal. ?Neurologic: grossly normal ? ?Pelvic: External genitalia:  no lesions ?             No abnormal inguinal nodes palpated. ?             Urethra:  normal appearing urethra with no masses, tenderness or lesions ?             Bartholins and Skenes: normal    ?             Vagina: normal appearing vagina with normal color and discharge, no lesions ?             Cervix: no lesions ?             Pap taken: {yes no:314532} ?Bimanual Exam:  Uterus:  normal size, contour, position, consistency, mobility, non-tender ?             Adnexa: no mass, fullness, tenderness ?              Rectal exam: {yes no:314532}.  Confirms. ?             Anus:  normal sphincter tone, no lesions ? ?Chaperone was present for exam:  *** ? ?Assessment:   ?Well woman visit with gynecologic exam. ? ? ?Plan: ?Mammogram screening discussed. ?Self breast awareness reviewed. ?Pap and HR HPV as above. ?Guidelines for Calcium, Vitamin D, regular exercise program including cardiovascular and weight bearing exercise. ?  ?Follow up annually and prn.  ? ?Additional counseling given.  {  yes no:314532}. ?_______ minutes face to face time of which over 50% was spent in counseling.  ? ? ?After visit summary provided.  ? ? ? ?

## 2021-08-11 ENCOUNTER — Ambulatory Visit: Payer: 59 | Admitting: Obstetrics and Gynecology

## 2021-09-03 ENCOUNTER — Telehealth: Payer: Self-pay | Admitting: Obstetrics and Gynecology

## 2021-09-03 NOTE — Telephone Encounter (Signed)
Please reach out to patient to schedule follow up breast imaging.  ? ?She came up in my reminder box.  ?She is overdue for diagnostic left breast imaging and is now due for bilateral breast imaging, unless she has had this done at another location outside of [facility].  ? ?I look forward to seeing her for her annual exam in May! ?

## 2021-09-05 NOTE — Telephone Encounter (Signed)
Left message for patient to call.

## 2021-09-05 NOTE — Telephone Encounter (Signed)
Patient informed with below note, reports she will call to schedule.  ?

## 2021-09-07 NOTE — Telephone Encounter (Signed)
Encounter reviewed and closed.  

## 2021-09-12 NOTE — Progress Notes (Deleted)
46 y.o. [id number] Single {Race/ethnicity:17218} female here for annual exam.    PCP:     No LMP recorded. (Menstrual status: IUD).           Sexually active: {yes no:314532}  The current method of family planning is {contraception:315051}.    Exercising: {yes no:314532}  {types:19826} Smoker:  no  Health Maintenance: Pap:  09-19-17 normal Neg HPV History of abnormal Pap:  no MMG:  08-13-20 Possible asymmetry Left breast ,09-03-20 Unilat. Left MMG-small asymmetry left breast Recommend 6 month follow up Colonoscopy:  2003 normal BMD:   never  Result   TDaP:  2019 Gardasil:   no HIV:NR Hep C:Neg Screening Labs:  Hb today: ***, Urine today: ***   reports that she has never smoked. She has never used smokeless tobacco. She reports that she does not drink alcohol and does not use drugs.  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   Anemia    Aphasia complicating stroke    Bilateral swelling of feet    Chronic systolic heart failure (HCC) 06/09/2016   a - Prob nonischemic //  Echo 10/17: EF 40-45, diffuse HK, mild LAE, PFO closure device without residual shunt  //  Could not tol cMRI // Echo 4/18: EF 55, normal wall motion, normal diastolic function, trivial MR, PFO device in place without residual shunt, PASP 24   Constipation    Depression    Eczema    GERD (gastroesophageal reflux disease)    H/O varicella    Hemiplegia affecting dominant side (HCC)    History of ischemic left MCA stroke    weakness on right side   Increased BMI 12/17/2008   LAE (left atrial enlargement) 04/05/2018   Mod, noted on ECHO   Migraines    Morbid obesity (HCC)    PFO with atrial septal aneurysm 06/04/2014   a - s/p transcatheter closure in 2/16 with 25 mm Amplatzer Cribiform ASO device // b - Echo 10/17:  EF 40-45, diffuse HK, mild LAE, PFO closure device without residual shunt    Stroke (cerebrum) (HCC)    Vitamin D deficiency     Past Surgical History:   Procedure Laterality Date   CESAREAN SECTION  1999   COLON SURGERY  2004   partial colectomy   HIATAL HERNIA REPAIR N/A 12/21/2020   Procedure: HERNIA REPAIR HIATAL;  Surgeon: Quentin Ore, MD;  Location: [facility];  Service: General;  Laterality: N/A;   LAPAROSCOPIC CHOLECYSTECTOMY  06/20/2012   LAPAROSCOPIC GASTRIC SLEEVE RESECTION N/A 07/29/2018   Procedure: LAPAROSCOPIC GASTRIC SLEEVE RESECTION, UPPER ENDO, ERAS Pathway;  Surgeon: Glenna Fellows, MD;  Location: [facility];  Service: General;  Laterality: N/A;   PATENT FORAMEN OVALE CLOSURE  07/09/2014   PATENT FORAMEN OVALE CLOSURE N/A 07/09/2014   Procedure: PATENT FORAMEN OVALE CLOSURE;  Surgeon: Micheline Chapman, MD;  Location: [facility] CATH LAB;  Service: Cardiovascular;  Laterality: N/A;   RADIOLOGY WITH ANESTHESIA N/A 05/29/2014   Procedure: RADIOLOGY WITH ANESTHESIA;  Surgeon: Oneal Grout, MD;  Location: [facility];  Service: Radiology;  Laterality: N/A;   TEE WITHOUT CARDIOVERSION N/A 06/03/2014   Procedure: TRANSESOPHAGEAL ECHOCARDIOGRAM (TEE);  Surgeon: Wendall Stade, MD;  Location: [facility] ENDOSCOPY;  Service: Cardiovascular;  Laterality: N/A;    Current Outpatient Medications  Medication Sig Dispense Refill   calcium carbonate (TUMS - DOSED IN MG ELEMENTAL CALCIUM) 500 MG chewable tablet Chew 1 tablet by mouth in the morning, at noon, and at  bedtime.     Cholecalciferol 125 MCG (5000 UT) capsule Take 1 capsule (5,000 Units total) by mouth daily. (Patient not taking: Reported on 04/28/2021) 90 capsule 1   gabapentin (NEURONTIN) 100 MG capsule Take 2 capsules (200 mg total) by mouth every 12 (twelve) hours. (Patient not taking: Reported on 04/28/2021) 20 capsule 0   levonorgestrel (MIRENA, 52 MG,) 20 MCG/24HR IUD 1 each by Intrauterine route once.     Multiple Vitamins-Minerals (MULTIVITAMIN WITH MINERALS) tablet Take 1 tablet by mouth in the morning.     pantoprazole (PROTONIX) 40 MG tablet Take 1 tablet (40 mg total) by mouth daily.  (Patient not taking: Reported on 04/28/2021) 90 tablet 0   thiamine (VITAMIN B-1) 50 MG tablet Take 1 tablet (50 mg total) by mouth daily. (Patient not taking: Reported on 04/28/2021) 90 tablet 1   No current facility-administered medications for this visit.    Family History  Problem Relation Age of Onset   Hypertension Mother    Diabetes Mother    Heart disease Mother    Sleep apnea Mother    Hypertension Father    Stroke Father    Diabetes Father    Heart disease Father    Stroke Paternal Grandfather    Stroke Maternal Grandfather    Cancer Other    Breast cancer Neg Hx    Colon cancer Neg Hx     Review of Systems  Exam:   There were no vitals taken for this visit.    General appearance: alert, cooperative and appears stated age Head: normocephalic, without obvious abnormality, atraumatic Neck: no adenopathy, supple, symmetrical, trachea midline and thyroid normal to inspection and palpation Lungs: clear to auscultation bilaterally Breasts: normal appearance, no masses or tenderness, No nipple retraction or dimpling, No nipple discharge or bleeding, No axillary adenopathy Heart: regular rate and rhythm Abdomen: soft, non-tender; no masses, no organomegaly Extremities: extremities normal, atraumatic, no cyanosis or edema Skin: skin color, texture, turgor normal. No rashes or lesions Lymph nodes: cervical, supraclavicular, and axillary nodes normal. Neurologic: grossly normal  Pelvic: External genitalia:  no lesions              No abnormal inguinal nodes palpated.              Urethra:  normal appearing urethra with no masses, tenderness or lesions              Bartholins and Skenes: normal                 Vagina: normal appearing vagina with normal color and discharge, no lesions              Cervix: no lesions              Pap taken: {yes no:314532} Bimanual Exam:  Uterus:  normal size, contour, position, consistency, mobility, non-tender              Adnexa: no  mass, fullness, tenderness              Rectal exam: {yes no:314532}.  Confirms.              Anus:  normal sphincter tone, no lesions  Chaperone was present for exam:  ***  Assessment:   Well woman visit with gynecologic exam.   Plan: Mammogram screening discussed. Self breast awareness reviewed. Pap and HR HPV as above. Guidelines for Calcium, Vitamin D, regular exercise program including cardiovascular and weight bearing exercise.   Follow  up annually and prn.   Additional counseling given.  {yes [id number]. _______ minutes face to face time of which over 50% was spent in counseling.    After visit summary provided.

## 2021-09-23 ENCOUNTER — Ambulatory Visit: Payer: 59 | Admitting: Obstetrics and Gynecology

## 2021-11-10 ENCOUNTER — Other Ambulatory Visit (HOSPITAL_COMMUNITY)
Admission: RE | Admit: 2021-11-10 | Discharge: 2021-11-10 | Disposition: A | Discharge status: Home / Self Care | Payer: Self-pay | Source: Ambulatory Visit | Attending: Family Medicine | Admitting: Family Medicine

## 2021-11-10 ENCOUNTER — Ambulatory Visit (INDEPENDENT_AMBULATORY_CARE_PROVIDER_SITE_OTHER): Payer: Self-pay | Admitting: Family Medicine

## 2021-11-10 ENCOUNTER — Other Ambulatory Visit: Payer: Self-pay

## 2021-11-10 ENCOUNTER — Other Ambulatory Visit (HOSPITAL_COMMUNITY): Payer: Self-pay

## 2021-11-10 VITALS — BP 107/70 | HR 62 | Wt 240.2 lb

## 2021-11-10 DIAGNOSIS — Z113 Encounter for screening for infections with a predominantly sexual mode of transmission: Secondary | ICD-10-CM

## 2021-11-10 DIAGNOSIS — G8929 Other chronic pain: Secondary | ICD-10-CM

## 2021-11-10 DIAGNOSIS — Z124 Encounter for screening for malignant neoplasm of cervix: Secondary | ICD-10-CM | POA: Insufficient documentation

## 2021-11-10 DIAGNOSIS — Z7689 Persons encountering health services in other specified circumstances: Secondary | ICD-10-CM

## 2021-11-10 DIAGNOSIS — Z9884 Bariatric surgery status: Secondary | ICD-10-CM

## 2021-11-10 DIAGNOSIS — R1013 Epigastric pain: Secondary | ICD-10-CM

## 2021-11-10 MED ORDER — METOCLOPRAMIDE HCL 10 MG PO TABS
10.0000 mg | ORAL_TABLET | Freq: Three times a day (TID) | ORAL | 0 refills | Status: DC
  Filled 2021-11-10: qty 60, 20d supply, fill #0

## 2021-11-10 NOTE — Progress Notes (Signed)
    SUBJECTIVE:   CHIEF COMPLAINT / HPI:   Ms. Hendley is a 46 yo who presents to establish care. Would like pap smear, STD check and blood draw  States she used to work for [employer] but got sick. 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 History of bypass surgery- states sleeve worked well for a year but then was throwing up a lot. Diagnosed with hiatal hernia on EGD, had it closed and changed to roux en y. Still  throws up so she watches what she eats but hasn't helped much. Can now feel hernia but couldn't follow up with GI. Currently throwing up every 2-3 days after eating. No known triggers. Has tried anti nausea medications without relief. Was told colon wasn't contracting after she had a child and had a partial colectomy. Currently no issus with Bms   PMH CVA due to embolism of left middle cerebral artery -Hemiplegia PFO with atrial septal aneurysm HFrEF IBS GERD Obesity s/p laparoscopic sleeve gastrectomy Vitamin D deficiency Thiamine deficiency  Has IUD, placed about a year ago, tolerating well  Does not have menstrual cycles  No new partners, currently sexually active with same partner. Still would like STI testing with pap smear today. No vaginal or urinary sx   No tobacco use , alcohol use, recreational drug use Active, works out   Maintenance Due for Pap smear, colonoscopy, mammogram No hx abnormal pap     OBJECTIVE:   BP 107/70   Pulse 62   Wt 240 lb 3.2 oz (109 kg)   SpO2 97%   BMI 35.47 kg/m    Physical exam  General: well appearing, NAD Cardiovascular: RRR, no murmurs Lungs: CTAB. Normal WOB Abdomen: soft, non-distended, tender to palpation in mid epigastric region without rebound tenderness. Negative Murphys sign.  Skin: warm, dry. No edema  ASSESSMENT/PLAN:   Encounter to establish care Patient did not have a PCP, but did follow with OBGYN, Cardiology, and GI.  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 Discussed that we will need multiple visits to cover everything and she expressed understanding. Recommended follow up in 2 weeks to address what we did not get to. Will discuss CCM referral at that time as well.   Abdominal pain, chronic, epigastric Potentially due to complications from gastric bypass surgery. Was previously worked up by GI who performed EGD and repaired a hiatal hernia. Has not been seen by GI recently due to lack of insurance. Recommended following back up with GI when she is able to. In the meantime will try Reglan 10mg  before meals. Last ECG reviewed and qtc appropriate. Will also check labs given hx of gastric bypass surgery- CBC, CMP, ferritin, folate, Vit B12, Vit D.  Pap smear for cervical cancer screening Denies history of abnormal pap smears. Performed pap smear and STI testing from the pap. Pelvic exam unremarkable. Will also check HIV, RPR.   Return in 2 weeks for follow up   , DO [facility]

## 2021-11-10 NOTE — Patient Instructions (Signed)
It was great seeing you today!  You came in to establish care and we were able to do your pap smear, STI testing and blood work. I will call you if anything is abnormal, and will send a my chart message if normal.  I am going to prescribe Reglan to help with the nausea discomfort after eating.  I do recommend following back up with your GI doctor when you are able to for further evaluation.  Please check-out at the front desk before leaving the clinic. I would like to see you back in about 2 weeks to discuss the items we did not get to today, but if you need to be seen earlier than that for any new issues we're happy to fit you in, just give [place] a call!  Visit Reminders: - Stop by the pharmacy to pick up your prescriptions  - Continue to work on your healthy eating habits and incorporating exercise into your daily life.   Feel free to call with any questions or concerns at any time, at [phone].   Take care,  Dr. Cora Collum [facility]

## 2021-11-11 ENCOUNTER — Other Ambulatory Visit (HOSPITAL_COMMUNITY): Payer: Self-pay

## 2021-11-11 DIAGNOSIS — G8929 Other chronic pain: Secondary | ICD-10-CM | POA: Insufficient documentation

## 2021-11-11 DIAGNOSIS — Z124 Encounter for screening for malignant neoplasm of cervix: Secondary | ICD-10-CM | POA: Insufficient documentation

## 2021-11-11 LAB — COMPREHENSIVE METABOLIC PANEL
ALT: 53 IU/L — ABNORMAL HIGH (ref 0–32)
AST: 128 IU/L — ABNORMAL HIGH (ref 0–40)
Albumin/Globulin Ratio: 1.6 (ref 1.2–2.2)
Albumin: 4.4 g/dL (ref 3.8–4.8)
Alkaline Phosphatase: 91 IU/L (ref 44–121)
BUN/Creatinine Ratio: 10 (ref 9–23)
BUN: 9 mg/dL (ref 6–24)
Bilirubin Total: 0.4 mg/dL (ref 0.0–1.2)
CO2: 21 mmol/L (ref 20–29)
Calcium: 9.4 mg/dL (ref 8.7–10.2)
Chloride: 105 mmol/L (ref 96–106)
Creatinine, Ser: 0.86 mg/dL (ref 0.57–1.00)
Globulin, Total: 2.8 g/dL (ref 1.5–4.5)
Glucose: 76 mg/dL (ref 70–99)
Potassium: 4.2 mmol/L (ref 3.5–5.2)
Sodium: 140 mmol/L (ref 134–144)
Total Protein: 7.2 g/dL (ref 6.0–8.5)
eGFR: 84 mL/min/{1.73_m2} (ref >59)

## 2021-11-11 LAB — CBC WITH DIFFERENTIAL/PLATELET
Basophils Absolute: 0.1 10*3/uL (ref 0.0–0.2)
Basos: 1 %
EOS (ABSOLUTE): 0.1 10*3/uL (ref 0.0–0.4)
Eos: 1 %
Hematocrit: 38.7 % (ref 34.0–46.6)
Hemoglobin: 12.7 g/dL (ref 11.1–15.9)
Immature Grans (Abs): 0 10*3/uL (ref 0.0–0.1)
Immature Granulocytes: 0 %
Lymphocytes Absolute: 3.1 10*3/uL (ref 0.7–3.1)
Lymphs: 38 %
MCH: 31.8 pg (ref 26.6–33.0)
MCHC: 32.8 g/dL (ref 31.5–35.7)
MCV: 97 fL (ref 79–97)
Monocytes Absolute: 0.5 10*3/uL (ref 0.1–0.9)
Monocytes: 6 %
Neutrophils Absolute: 4.3 10*3/uL (ref 1.4–7.0)
Neutrophils: 54 %
Platelets: 355 10*3/uL (ref 150–450)
RBC: 4 x10E6/uL (ref 3.77–5.28)
RDW: 12.8 % (ref 11.7–15.4)
WBC: 8 10*3/uL (ref 3.4–10.8)

## 2021-11-11 LAB — RPR: RPR Ser Ql: NONREACTIVE

## 2021-11-11 LAB — VITAMIN D 25 HYDROXY (VIT D DEFICIENCY, FRACTURES): Vit D, 25-Hydroxy: 26.9 ng/mL — ABNORMAL LOW (ref 30.0–100.0)

## 2021-11-11 LAB — FOLATE: Folate: 4.5 ng/mL (ref >3.0)

## 2021-11-11 LAB — VITAMIN B12: Vitamin B-12: 1315 pg/mL — ABNORMAL HIGH (ref 232–1245)

## 2021-11-11 LAB — HIV ANTIBODY (ROUTINE TESTING W REFLEX): HIV Screen 4th Generation wRfx: NONREACTIVE

## 2021-11-11 LAB — FERRITIN: Ferritin: 48 ng/mL (ref 15–150)

## 2021-11-11 NOTE — Assessment & Plan Note (Signed)
Patient did not have a PCP, but did follow with OBGYN, Cardiology, and GI. 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 Discussed that we will need multiple visits to cover everything and she expressed understanding. Recommended follow up in 2 weeks to address what we did not get to. Will discuss CCM referral at that time as well.

## 2021-11-11 NOTE — Assessment & Plan Note (Signed)
Denies history of abnormal pap smears. Performed pap smear and STI testing from the pap. Pelvic exam unremarkable. Will also check HIV, RPR.

## 2021-11-11 NOTE — Assessment & Plan Note (Signed)
Potentially due to complications from gastric bypass surgery. Was previously worked up by GI who performed EGD and repaired a hiatal hernia. Has not been seen by GI recently due to lack of insurance. Recommended following back up with GI when she is able to. In the meantime will try Reglan 10mg  before meals. Last ECG reviewed and qtc appropriate. Will also check labs given hx of gastric bypass surgery- CBC, CMP, ferritin, folate, Vit B12, Vit D.

## 2021-11-16 ENCOUNTER — Other Ambulatory Visit (HOSPITAL_COMMUNITY): Payer: Self-pay

## 2021-11-16 ENCOUNTER — Other Ambulatory Visit: Payer: Self-pay | Admitting: Family Medicine

## 2021-11-16 MED ORDER — VITAMIN D (ERGOCALCIFEROL) 1.25 MG (50000 UNIT) PO CAPS
50000.0000 [IU] | ORAL_CAPSULE | ORAL | 0 refills | Status: DC
  Filled 2021-11-16: qty 8, 56d supply, fill #0

## 2021-11-17 ENCOUNTER — Other Ambulatory Visit (HOSPITAL_COMMUNITY): Payer: Self-pay

## 2021-11-17 LAB — CYTOLOGY - PAP
Chlamydia: NEGATIVE
Comment: NEGATIVE
Comment: NEGATIVE
Comment: NEGATIVE
Comment: NORMAL
Diagnosis: NEGATIVE
Diagnosis: REACTIVE
High risk HPV: NEGATIVE
Neisseria Gonorrhea: NEGATIVE
Trichomonas: NEGATIVE

## 2021-11-21 ENCOUNTER — Ambulatory Visit (INDEPENDENT_AMBULATORY_CARE_PROVIDER_SITE_OTHER): Payer: Self-pay | Admitting: Family Medicine

## 2021-11-21 ENCOUNTER — Encounter: Payer: Self-pay | Admitting: Family Medicine

## 2021-11-21 ENCOUNTER — Other Ambulatory Visit (HOSPITAL_COMMUNITY): Payer: Self-pay

## 2021-11-21 VITALS — BP 128/70 | HR 68 | Wt 246.0 lb

## 2021-11-21 DIAGNOSIS — R1013 Epigastric pain: Secondary | ICD-10-CM

## 2021-11-21 DIAGNOSIS — G8929 Other chronic pain: Secondary | ICD-10-CM

## 2021-11-21 MED ORDER — HYOSCYAMINE SULFATE 0.125 MG PO TABS
0.1250 mg | ORAL_TABLET | Freq: Three times a day (TID) | ORAL | 0 refills | Status: DC
  Filled 2021-11-21: qty 90, 30d supply, fill #0

## 2021-11-21 NOTE — Progress Notes (Unsigned)
    SUBJECTIVE:   CHIEF COMPLAINT / HPI:   Patient presents to follow up on abdominal pain.   3 years post sleeve gastrectomy. Still strugges with nausea and vomiting. Was then seen in 2022 for repair of the hernia with conversion from sleeve gastrectomy to gastric bypass.  Reglan not helping Zofran did not help in the past   Wants to check liver enzymes again   Applying for medicaid/ medicare  Because of her stroke may qualify for medicare again.   Last year    Per last note: Currently endorses stomach discomfort and nausea with occasional vomiting after she eats. History of bypass surgery- states sleeve worked well for a year but then was throwing up a lot. Diagnosed with hiatal hernia on EGD, had it closed and changed to roux en y. Still  throws up so she watches what she eats but hasn't helped much. Can now feel hernia but couldn't follow up with GI. Currently throwing up every 2-3 days after eating. No known triggers. Has tried anti nausea medications without relief. Was told colon wasn't contracting after she had a child and had a partial colectomy. Currently no issus with Bms   Abdominal pain, chronic, epigastric Potentially due to complications from gastric bypass surgery. Was previously worked up by GI who performed EGD and repaired a hiatal hernia. Has not been seen by GI recently due to lack of insurance. Recommended following back up with GI when she is able to. In the meantime will try Reglan 10mg  before meals. Last ECG reviewed and qtc appropriate. Will also check labs given hx of gastric bypass surgery- CBC, CMP, ferritin, folate, Vit B12, Vit D.  PERTINENT  PMH / PSH: ***  OBJECTIVE:   BP 128/70   Pulse 68   Wt 246 lb (111.6 kg)   BMI 36.33 kg/m   ***  ASSESSMENT/PLAN:   No problem-specific Assessment & Plan notes found for this encounter.     , DO [facility]

## 2021-11-21 NOTE — Patient Instructions (Addendum)
It was great seeing you today!  You came in for follow up on your abdominal discomfort and nausea after eating and we are going to check your blood work and also try a medication called Hyoscyamine which you will take 30 minutes before meals. It works by relaxing muscles of the digestive tract and decreasing amount of stomach acid produced.   Feel free to call with any questions or concerns at any time, at [phone].   Take care,  Dr. Cora Collum [facility]

## 2021-11-22 LAB — COMPREHENSIVE METABOLIC PANEL
ALT: 24 IU/L (ref 0–32)
AST: 25 IU/L (ref 0–40)
Albumin/Globulin Ratio: 1.5 (ref 1.2–2.2)
Albumin: 3.9 g/dL (ref 3.9–4.9)
Alkaline Phosphatase: 79 IU/L (ref 44–121)
BUN/Creatinine Ratio: 9 (ref 9–23)
BUN: 7 mg/dL (ref 6–24)
Bilirubin Total: 0.3 mg/dL (ref 0.0–1.2)
CO2: 19 mmol/L — ABNORMAL LOW (ref 20–29)
Calcium: 9.2 mg/dL (ref 8.7–10.2)
Chloride: 107 mmol/L — ABNORMAL HIGH (ref 96–106)
Creatinine, Ser: 0.74 mg/dL (ref 0.57–1.00)
Globulin, Total: 2.6 g/dL (ref 1.5–4.5)
Glucose: 84 mg/dL (ref 70–99)
Potassium: 4.1 mmol/L (ref 3.5–5.2)
Sodium: 138 mmol/L (ref 134–144)
Total Protein: 6.5 g/dL (ref 6.0–8.5)
eGFR: 101 mL/min/{1.73_m2} (ref >59)

## 2021-11-22 LAB — ACUTE VIRAL HEPATITIS (HAV, HBV, HCV)
HCV Ab: NONREACTIVE
Hep A IgM: NEGATIVE
Hep B C IgM: NEGATIVE
Hepatitis B Surface Ag: NEGATIVE

## 2021-11-22 LAB — HCV INTERPRETATION

## 2021-11-23 NOTE — Assessment & Plan Note (Signed)
Presents for follow-up on continued postprandial abdominal discomfort and nausea after hiatal hernia repair and gastric bypass surgery.  No relief from Reglan or Zofran. When she is able highly recommend seeing GI/surgery for further evaluation to check gastric emptying, etc. we will trial Hyoscyamine 30 minutes before meals.  We will check liver enzymes and acute hepatitis panel.  Return precautions discussed.

## 2021-11-24 ENCOUNTER — Telehealth: Payer: Self-pay | Admitting: *Deleted

## 2021-11-24 NOTE — Telephone Encounter (Signed)
Patient called and states that she feels that she is developing BV again.  She is now experiencing vaginal odor and wondered if she can be treated for this.  Will forward to MD.  No office visits available until middle of next week.  Davidlee Jeanbaptiste,CMA

## 2021-11-25 ENCOUNTER — Other Ambulatory Visit (HOSPITAL_COMMUNITY): Payer: Self-pay

## 2021-11-25 ENCOUNTER — Other Ambulatory Visit: Payer: Self-pay | Admitting: Family Medicine

## 2021-11-25 MED ORDER — METRONIDAZOLE 500 MG PO TABS
500.0000 mg | ORAL_TABLET | Freq: Two times a day (BID) | ORAL | 0 refills | Status: DC
  Filled 2021-11-25: qty 14, 7d supply, fill #0

## 2021-11-29 NOTE — Telephone Encounter (Signed)
Medication sent to pharmacy. Tolbert Matheson,CMA  

## 2022-07-21 ENCOUNTER — Encounter (HOSPITAL_COMMUNITY): Payer: Self-pay | Admitting: *Deleted

## 2023-05-15 ENCOUNTER — Emergency Department (HOSPITAL_BASED_OUTPATIENT_CLINIC_OR_DEPARTMENT_OTHER)
Admission: EM | Admit: 2023-05-15 | Discharge: 2023-05-15 | Disposition: A | Discharge status: Home / Self Care | Payer: No Typology Code available for payment source | Attending: Emergency Medicine | Admitting: Emergency Medicine

## 2023-05-15 ENCOUNTER — Other Ambulatory Visit: Payer: Self-pay

## 2023-05-15 ENCOUNTER — Encounter (HOSPITAL_BASED_OUTPATIENT_CLINIC_OR_DEPARTMENT_OTHER): Payer: Self-pay | Admitting: Emergency Medicine

## 2023-05-15 DIAGNOSIS — Z043 Encounter for examination and observation following other accident: Secondary | ICD-10-CM | POA: Diagnosis present

## 2023-05-15 DIAGNOSIS — Y92410 Unspecified street and highway as the place of occurrence of the external cause: Secondary | ICD-10-CM | POA: Diagnosis not present

## 2023-05-15 MED ORDER — METHOCARBAMOL 500 MG PO TABS: 500.0000 mg | ORAL_TABLET | Freq: Two times a day (BID) | ORAL | 0 refills | Status: DC

## 2023-05-15 MED ORDER — IBUPROFEN 400 MG PO TABS
600.0000 mg | ORAL_TABLET | Freq: Once | ORAL | Status: AC
  Administered 2023-05-15: 600 mg via ORAL
  Filled 2023-05-15: qty 1

## 2023-05-15 MED ORDER — NAPROXEN 500 MG PO TABS: 500.0000 mg | ORAL_TABLET | Freq: Two times a day (BID) | ORAL | 0 refills | Status: DC

## 2023-05-15 MED ORDER — LIDOCAINE 5 % EX PTCH
1.0000 | MEDICATED_PATCH | CUTANEOUS | 0 refills | Status: DC
Start: 2023-05-15 — End: 2023-06-14
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
## 2023-05-24 ENCOUNTER — Other Ambulatory Visit (HOSPITAL_COMMUNITY): Payer: Self-pay

## 2023-05-24 MED ORDER — METHOCARBAMOL 500 MG PO TABS
500.0000 mg | ORAL_TABLET | Freq: Two times a day (BID) | ORAL | 0 refills | Status: DC
  Filled 2023-05-24: qty 20, 10d supply, fill #0

## 2023-05-24 MED ORDER — LIDOCAINE 5 % EX PTCH
1.0000 | MEDICATED_PATCH | Freq: Every day | CUTANEOUS | 0 refills | Status: DC
  Filled 2023-05-24: qty 30, 30d supply, fill #0

## 2023-05-24 MED ORDER — NAPROXEN 500 MG PO TABS
500.0000 mg | ORAL_TABLET | Freq: Two times a day (BID) | ORAL | 0 refills | Status: DC
  Filled 2023-05-24: qty 30, 15d supply, fill #0

## 2023-06-05 ENCOUNTER — Other Ambulatory Visit (HOSPITAL_COMMUNITY): Payer: Self-pay

## 2023-06-14 ENCOUNTER — Ambulatory Visit (INDEPENDENT_AMBULATORY_CARE_PROVIDER_SITE_OTHER): Payer: Self-pay

## 2023-06-14 ENCOUNTER — Ambulatory Visit: Payer: Self-pay | Attending: Cardiovascular Disease | Admitting: Cardiovascular Disease

## 2023-06-14 ENCOUNTER — Encounter: Payer: Self-pay | Admitting: Cardiovascular Disease

## 2023-06-14 VITALS — BP 108/70 | HR 62 | Ht 70.0 in | Wt 222.6 lb

## 2023-06-14 DIAGNOSIS — I5032 Chronic diastolic (congestive) heart failure: Secondary | ICD-10-CM

## 2023-06-14 DIAGNOSIS — I253 Aneurysm of heart: Secondary | ICD-10-CM

## 2023-06-14 DIAGNOSIS — R55 Syncope and collapse: Secondary | ICD-10-CM

## 2023-06-14 DIAGNOSIS — Q2112 Patent foramen ovale: Secondary | ICD-10-CM

## 2023-06-14 NOTE — Patient Instructions (Signed)
Testing/Procedures: Zio heart monitor x 2 weeks Your physician has recommended that you wear an event monitor. Event monitors are medical devices that record the heart's electrical activity. Doctors most often [place] these monitors to diagnose arrhythmias. Arrhythmias are problems with the speed or rhythm of the heartbeat. The monitor is a small, portable device. You can wear one while you do your normal daily activities. This is usually used to diagnose what is causing palpitations/syncope (passing out).  ECHO Your physician has requested that you have an echocardiogram. Echocardiography is a painless test that uses sound waves to create images of your heart. It provides your doctor with information about the size and shape of your heart and how well your heart's chambers and valves are working. This procedure takes approximately one hour. There are no restrictions for this procedure. Please do NOT wear cologne, perfume, aftershave, or lotions (deodorant is allowed). Please arrive 15 minutes prior to your appointment time.  Please note: We ask at that you not bring children with you during ultrasound (echo/ vascular) testing. Due to room size and safety concerns, children are not allowed in the ultrasound rooms during exams. Our front office staff cannot provide observation of children in our lobby area while testing is being conducted. An adult accompanying a patient to their appointment will only be allowed in the ultrasound room at the discretion of the ultrasound technician under special circumstances. We apologize for any inconvenience.  Follow-Up: At [facility], you and your health needs are our priority.  As part of our continuing mission to provide you with exceptional heart care, we have created designated Provider Care Teams.  These Care Teams include your primary Cardiologist (physician) and Advanced Practice Providers (APPs -  Physician Assistants and Nurse Practitioners) who all work  together to provide you with the care you need, when you need it.  Your next appointment:   1 year(s)  Provider:   Tonny Bollman, MD        1st Floor: - Lobby - Registration  - Pharmacy  - Lab - Cafe  2nd Floor: - PV Lab - Diagnostic Testing (echo, CT, nuclear med)  3rd Floor: - Vacant  4th Floor: - TCTS (cardiothoracic surgery) - AFib Clinic - Structural Heart Clinic - Vascular Surgery  - Vascular Ultrasound  5th Floor: - [facility] (general and EP) - Clinical Pharmacy for coumadin, hypertension, lipid, weight-loss medications, and med management appointments   Valet parking services will be available as well.

## 2023-06-14 NOTE — Progress Notes (Signed)
Cardiology Office Note:    Date:  06/14/2023   ID:  ALI MCLAURIN, DOB 06/14/1975, MRN [id number]  PCP:  Penne Lash, MD   [facility] Providers Cardiologist:  Tonny Bollman, MD     Referring MD: Penne Lash, MD   Chief Complaint  Patient presents with   Near Syncope    History of Present Illness:    Rebecca Russo is a 48 y.o. female with a hx of PFO and cryptogenic stroke, presenting for follow-up evaluation.  The patient had a left MCA infarct in 2016 and she underwent transcatheter PFO closure at that time.  LVEF was mildly depressed at 40 to 45% but later improved to low normal range at 50 to 55%.  Her most recent echocardiogram in 2022 showed further improvement in LV function with LVEF 60 to 65%, normal RV function, normal position of her PFO occluder device, and no significant valvular disease.  The patient is here with her brother today.  She has been experiencing some episodes of lightheadedness and has had a few episodes of syncope.  These have occurred since last year.  She does not have reproducible symptoms with postural changes.  Episodes have occurred sitting and standing with some prodrome of lightheadedness and weakness.  She has not had heart palpitations, chest pain, or shortness of breath.  She denies diaphoresis.  Episodes have not been linked with eating meals or any particular physical activity.  She has not sustained any injuries with her episodes.  The patient has lost over 100 pounds.  She is drinking a lot of water.  She does not feel like she has been dehydrated.  She is not having any menopausal symptoms and specifically denies hot flashes.  She still has regular menstrual periods.  Current Medications: Current Meds  Medication Sig   gabapentin (NEURONTIN) 100 MG capsule Take 2 capsules (200 mg total) by mouth every 12 (twelve) hours.   hyoscyamine (LEVSIN) 0.125 MG tablet Take 1 tablet (0.125 mg total) by mouth 3 (three) times daily before  meals.   levonorgestrel (MIRENA, 52 MG,) 20 MCG/24HR IUD 1 each by Intrauterine route once.   methocarbamol (ROBAXIN) 500 MG tablet Take 1 tablet (500 mg total) by mouth 2 (two) times daily.   metoCLOPramide (REGLAN) 10 MG tablet Take 1 tablet (10 mg total) by mouth 3 (three) times daily before meals.   Multiple Vitamins-Minerals (MULTIVITAMIN WITH MINERALS) tablet Take 1 tablet by mouth in the morning.   pantoprazole (PROTONIX) 40 MG tablet Take 1 tablet (40 mg total) by mouth daily.   thiamine (VITAMIN B-1) 50 MG tablet Take 1 tablet (50 mg total) by mouth daily.   [DISCONTINUED] lidocaine (LIDODERM) 5 % Place 1 patch onto the skin daily. Remove & Discard patch within 12 hours or as directed by MD   [DISCONTINUED] lidocaine (LIDODERM) 5 % Place 1 patch onto the skin daily. Remove and discard patch within 12 hours or as directed by doctor.   [DISCONTINUED] methocarbamol (ROBAXIN) 500 MG tablet Take 1 tablet (500 mg total) by mouth 2 (two) times daily.   [DISCONTINUED] metroNIDAZOLE (FLAGYL) 500 MG tablet Take 1 tablet (500 mg total) by mouth 2 (two) times daily for 7 days   [DISCONTINUED] naproxen (NAPROSYN) 500 MG tablet Take 1 tablet (500 mg total) by mouth 2 (two) times daily.   [DISCONTINUED] naproxen (NAPROSYN) 500 MG tablet Take 1 tablet (500 mg total) by mouth 2 (two) times daily.   [DISCONTINUED] Vitamin D, Ergocalciferol, (DRISDOL) 1.25  MG (50000 UNIT) CAPS capsule Take 1 capsule (50,000 Units total) by mouth every 7 (seven) days.     Allergies:   Patient has no known allergies.   ROS:   Please see the history of present illness.    All other systems reviewed and are negative.  EKGs/Labs/Other Studies Reviewed:    The following studies were reviewed today: Cardiac Studies & Procedures      ECHOCARDIOGRAM  ECHOCARDIOGRAM COMPLETE 06/11/2020  Narrative ECHOCARDIOGRAM REPORT    Patient Name:   Rebecca Russo Date of Exam: 06/11/2020 Medical Rec #:  [id number]      Height:        70.0 in Accession #:    [id number]     Weight:       269.2 lb Date of Birth:  1976-04-15      BSA:          2.368 m Patient Age:    44 years       BP:           110/78 mmHg Patient Gender: F              HR:           84 bpm. Exam Location:  [address]  Procedure: 2D Echo, 3D Echo, Cardiac Doppler, Color Doppler and Strain Analysis  Indications:    Q21.1 PFO  History:        Patient has prior history of Echocardiogram examinations, most recent 04/05/2018. Stroke; Risk Factors:Dyslipidemia and Morbid obesity. PFO s/p 25mm Amplatzer cribriform closure device 06/04/14.  Sonographer:    Garald Braver, RDCS Referring Phys: [phone] [place]  IMPRESSIONS   1. Left ventricular ejection fraction, by estimation, is 60 to 65%. The left ventricle has normal function. The left ventricle has no regional wall motion abnormalities. Left ventricular diastolic parameters were normal. 2. Right ventricular systolic function is normal. The right ventricular size is normal. There is normal pulmonary artery systolic pressure. 3. PFO s/p 25mm Amplatzer cribriform closure device 06/04/14. No residual shunt. 4. The mitral valve is normal in structure. Trivial mitral valve regurgitation. No evidence of mitral stenosis. 5. The aortic valve is normal in structure. Aortic valve regurgitation is not visualized. No aortic stenosis is present. 6. The inferior vena cava is normal in size with greater than 50% respiratory variability, suggesting right atrial pressure of 3 mmHg.  Comparison(s): 04/05/18 EF 50-55%. GLS -17.9%.  FINDINGS Left Ventricle: Left ventricular ejection fraction, by estimation, is 60 to 65%. The left ventricle has normal function. The left ventricle has no regional wall motion abnormalities. The left ventricular internal cavity size was normal in size. There is no left ventricular hypertrophy. Left ventricular diastolic parameters were normal.  Right Ventricle: The right ventricular size  is normal. No increase in right ventricular wall thickness. Right ventricular systolic function is normal. There is normal pulmonary artery systolic pressure. The tricuspid regurgitant velocity is 2.44 m/s, and with an assumed right atrial pressure of 3 mmHg, the estimated right ventricular systolic pressure is 26.8 mmHg.  Left Atrium: Left atrial size was normal in size.  Right Atrium: Right atrial size was normal in size.  Pericardium: There is no evidence of pericardial effusion.  Mitral Valve: The mitral valve is normal in structure. Trivial mitral valve regurgitation. No evidence of mitral valve stenosis.  Tricuspid Valve: The tricuspid valve is normal in structure. Tricuspid valve regurgitation is trivial. No evidence of tricuspid stenosis.  Aortic Valve: The aortic valve is  normal in structure. Aortic valve regurgitation is not visualized. No aortic stenosis is present.  Pulmonic Valve: The pulmonic valve was normal in structure. Pulmonic valve regurgitation is not visualized. No evidence of pulmonic stenosis.  Aorta: The aortic root is normal in size and structure.  Venous: The inferior vena cava is normal in size with greater than 50% respiratory variability, suggesting right atrial pressure of 3 mmHg.  IAS/Shunts: No atrial level shunt detected by color flow Doppler.   LEFT VENTRICLE PLAX 2D LVIDd:         5.60 cm  Diastology LVIDs:         3.80 cm  LV e' medial:    12.40 cm/s LV PW:         0.80 cm  LV E/e' medial:  8.3 LV IVS:        0.90 cm  LV e' lateral:   13.70 cm/s LVOT diam:     2.60 cm  LV E/e' lateral: 7.5 LV SV:         112 LV SV Index:   47       2D Longitudinal Strain LVOT Area:     5.31 cm 2D Strain GLS (A2C):   -23.2 % 2D Strain GLS (A3C):   -21.5 % 2D Strain GLS (A4C):   -25.9 % 2D Strain GLS Avg:     -23.5 %  3D Volume EF: 3D EF:        58 % LV EDV:       202 ml LV ESV:       84 ml LV SV:        118 ml  RIGHT VENTRICLE RV Basal diam:  3.80  cm RV S prime:     12.30 cm/s TAPSE (M-mode): 2.0 cm RVSP:           26.8 mmHg  LEFT ATRIUM             Index       RIGHT ATRIUM           Index LA diam:        3.90 cm 1.65 cm/m  RA Pressure: 3.00 mmHg LA Vol (A2C):   82.4 ml 34.79 ml/m RA Area:     13.00 cm LA Vol (A4C):   56.2 ml 23.73 ml/m RA Volume:   35.90 ml  15.16 ml/m LA Biplane Vol: 69.9 ml 29.52 ml/m AORTIC VALVE LVOT Vmax:   106.00 cm/s LVOT Vmean:  70.600 cm/s LVOT VTI:    0.211 m  AORTA Ao Root diam: 3.50 cm Ao Asc diam:  3.00 cm  TRICUSPID VALVE TR Peak grad:   23.8 mmHg MITRAL VALVE                TR Vmax:        244.00 cm/s MV E velocity: 103.00 cm/s  Estimated RAP:  3.00 mmHg MV A velocity: 64.30 cm/s   RVSP:           26.8 mmHg MV E/A ratio:  1.60 SHUNTS Systemic VTI:  0.21 m Systemic Diam: 2.60 cm  Donato Schultz MD Electronically signed by Donato Schultz MD Signature Date/Time: 06/11/2020/3:09:55 PM    Final             EKG:   EKG Interpretation Date/Time:  Thursday June 14 2023 10:40:02 EST Ventricular Rate:  62 PR Interval:  182 QRS Duration:  96 QT Interval:  388 QTC Calculation: 393 R Axis:   48  Text  Interpretation: Normal sinus rhythm Nonspecific T wave abnormality When compared with ECG of 08-Jan-2018 11:54, QT has shortened Confirmed by Tonny Bollman [phone]) on 06/14/2023 10:51:03 AM    Recent Labs: No results found for requested labs within last 365 days.  Recent Lipid Panel    Component Value Date/Time   CHOL 161 08/24/2020 0933   CHOL 152 08/29/2017 1500   TRIG 96.0 08/24/2020 0933   HDL 55.00 08/24/2020 0933   HDL 48 08/29/2017 1500   CHOLHDL 3 08/24/2020 0933   VLDL 19.2 08/24/2020 0933   LDLCALC 87 08/24/2020 0933   LDLCALC 79 08/29/2017 1500     Risk Assessment/Calculations:                Physical Exam:    VS:  BP 108/70   Pulse 62   Ht 5\' 10"  (1.778 m)   Wt 222 lb 9.6 oz (101 kg)   SpO2 99%   BMI 31.94 kg/m     Wt Readings from Last 3  Encounters:  06/14/23 222 lb 9.6 oz (101 kg)  05/15/23 214 lb (97.1 kg)  11/21/21 246 lb (111.6 kg)     GEN:  Well nourished, well developed in no acute distress HEENT: Normal NECK: No JVD; No carotid bruits LYMPHATICS: No lymphadenopathy CARDIAC: RRR, no murmurs, rubs, gallops RESPIRATORY:  Clear to auscultation without rales, wheezing or rhonchi  ABDOMEN: Soft, non-tender, non-distended MUSCULOSKELETAL:  No edema; No deformity  SKIN: Warm and dry NEUROLOGIC:  Alert and oriented x 3 PSYCHIATRIC:  Normal affect   Assessment & Plan PFO with atrial septal aneurysm Status post device closure many years ago.  No residual stroke symptoms. Heart failure with improved ejection fraction (HFimpEF) (HCC) LVEF has normalized over time without medical therapy.  Her blood pressure is too low to tolerate any GDMT. Syncope and collapse Unclear etiology.  I emphasized the importance of adequate fluid hydration which I think she is doing.  I am going to check some baseline labs to make sure that she is not anemic or have some other metabolic cause of symptoms.  I recommended an updated echocardiogram to evaluate for any changes in LV function.  I have recommended a 2-week ZIO monitor to check for arrhythmia.  The patient has always run a low blood pressure and I advised her that it would be okay to liberalize her sodium intake as this might help as well.      Medication Adjustments/Labs and Tests Ordered: Current medicines are reviewed at length with the patient today.  Concerns regarding medicines are outlined above.  Orders Placed This Encounter  Procedures   CBC   Comprehensive metabolic panel   TSH   LONG TERM MONITOR (3-14 DAYS)   EKG 12-Lead   ECHOCARDIOGRAM COMPLETE   No orders of the defined types were placed in this encounter.   Patient Instructions  Testing/Procedures: Zio heart monitor x 2 weeks Your physician has recommended that you wear an event monitor. Event monitors are  medical devices that record the heart's electrical activity. Doctors most often [place] these monitors to diagnose arrhythmias. Arrhythmias are problems with the speed or rhythm of the heartbeat. The monitor is a small, portable device. You can wear one while you do your normal daily activities. This is usually used to diagnose what is causing palpitations/syncope (passing out).  ECHO Your physician has requested that you have an echocardiogram. Echocardiography is a painless test that uses sound waves to create images of your heart. It provides your  doctor with information about the size and shape of your heart and how well your heart's chambers and valves are working. This procedure takes approximately one hour. There are no restrictions for this procedure. Please do NOT wear cologne, perfume, aftershave, or lotions (deodorant is allowed). Please arrive 15 minutes prior to your appointment time.  Please note: We ask at that you not bring children with you during ultrasound (echo/ vascular) testing. Due to room size and safety concerns, children are not allowed in the ultrasound rooms during exams. Our front office staff cannot provide observation of children in our lobby area while testing is being conducted. An adult accompanying a patient to their appointment will only be allowed in the ultrasound room at the discretion of the ultrasound technician under special circumstances. We apologize for any inconvenience.  Follow-Up: At [facility], you and your health needs are our priority.  As part of our continuing mission to provide you with exceptional heart care, we have created designated Provider Care Teams.  These Care Teams include your primary Cardiologist (physician) and Advanced Practice Providers (APPs -  Physician Assistants and Nurse Practitioners) who all work together to provide you with the care you need, when you need it.  Your next appointment:   1 year(s)  Provider:   Tonny Bollman, MD        1st Floor: - Lobby - Registration  - Pharmacy  - Lab - Cafe  2nd Floor: - PV Lab - Diagnostic Testing (echo, CT, nuclear med)  3rd Floor: - Vacant  4th Floor: - TCTS (cardiothoracic surgery) - AFib Clinic - Structural Heart Clinic - Vascular Surgery  - Vascular Ultrasound  5th Floor: - HeartCare Cardiology (general and EP) - Clinical Pharmacy for coumadin, hypertension, lipid, weight-loss medications, and med management appointments   Valet parking services will be available as well.      Signed, Tonny Bollman, MD  06/14/2023 1:37 PM    [facility]

## 2023-06-14 NOTE — Assessment & Plan Note (Signed)
Status post device closure many years ago.  No residual stroke symptoms.

## 2023-06-14 NOTE — Progress Notes (Unsigned)
Enrolled patient for a 14 day Zio XT  monitor to be mailed to patients home

## 2023-06-15 LAB — CBC
Hematocrit: 36.9 % (ref 34.0–46.6)
Hemoglobin: 12.1 g/dL (ref 11.1–15.9)
MCH: 32 pg (ref 26.6–33.0)
MCHC: 32.8 g/dL (ref 31.5–35.7)
MCV: 98 fL — ABNORMAL HIGH (ref 79–97)
Platelets: 381 10*3/uL (ref 150–450)
RBC: 3.78 x10E6/uL (ref 3.77–5.28)
RDW: 12.7 % (ref 11.7–15.4)
WBC: 7.5 10*3/uL (ref 3.4–10.8)

## 2023-06-15 LAB — COMPREHENSIVE METABOLIC PANEL
ALT: 14 [IU]/L (ref 0–32)
AST: 22 [IU]/L (ref 0–40)
Albumin: 4.4 g/dL (ref 3.9–4.9)
Alkaline Phosphatase: 74 [IU]/L (ref 44–121)
BUN/Creatinine Ratio: 13 (ref 9–23)
BUN: 10 mg/dL (ref 6–24)
Bilirubin Total: 0.3 mg/dL (ref 0.0–1.2)
CO2: 20 mmol/L (ref 20–29)
Calcium: 9.5 mg/dL (ref 8.7–10.2)
Chloride: 105 mmol/L (ref 96–106)
Creatinine, Ser: 0.8 mg/dL (ref 0.57–1.00)
Globulin, Total: 3 g/dL (ref 1.5–4.5)
Glucose: 77 mg/dL (ref 70–99)
Potassium: 4.4 mmol/L (ref 3.5–5.2)
Sodium: 142 mmol/L (ref 134–144)
Total Protein: 7.4 g/dL (ref 6.0–8.5)
eGFR: 91 mL/min/{1.73_m2} (ref >59)

## 2023-06-15 LAB — TSH: TSH: 2.53 u[IU]/mL (ref 0.450–4.500)

## 2023-06-26 DIAGNOSIS — I253 Aneurysm of heart: Secondary | ICD-10-CM | POA: Diagnosis not present

## 2023-06-26 DIAGNOSIS — Q2112 Patent foramen ovale: Secondary | ICD-10-CM | POA: Diagnosis not present

## 2023-06-26 DIAGNOSIS — R55 Syncope and collapse: Secondary | ICD-10-CM

## 2023-07-02 ENCOUNTER — Ambulatory Visit (HOSPITAL_COMMUNITY): Payer: No Typology Code available for payment source

## 2023-07-20 ENCOUNTER — Encounter (HOSPITAL_COMMUNITY): Payer: Self-pay

## 2023-07-20 ENCOUNTER — Ambulatory Visit (HOSPITAL_COMMUNITY): Payer: Self-pay

## 2023-07-20 DIAGNOSIS — Q2112 Patent foramen ovale: Secondary | ICD-10-CM | POA: Diagnosis present

## 2023-07-20 DIAGNOSIS — R55 Syncope and collapse: Secondary | ICD-10-CM | POA: Diagnosis not present

## 2023-07-20 DIAGNOSIS — I253 Aneurysm of heart: Secondary | ICD-10-CM | POA: Insufficient documentation

## 2023-07-20 DIAGNOSIS — I5032 Chronic diastolic (congestive) heart failure: Secondary | ICD-10-CM | POA: Insufficient documentation

## 2023-07-20 LAB — ECHOCARDIOGRAM COMPLETE
Area-P 1/2: 3.08 cm2
S' Lateral: 3.5 cm

## 2023-07-26 ENCOUNTER — Encounter (HOSPITAL_COMMUNITY): Payer: Self-pay | Admitting: *Deleted

## 2023-07-30 ENCOUNTER — Other Ambulatory Visit: Payer: Self-pay | Admitting: Family Medicine

## 2023-07-30 DIAGNOSIS — N6489 Other specified disorders of breast: Secondary | ICD-10-CM

## 2023-08-07 ENCOUNTER — Encounter

## 2023-08-27 ENCOUNTER — Ambulatory Visit

## 2023-08-27 ENCOUNTER — Encounter: Payer: Self-pay | Admitting: Family Medicine

## 2023-08-27 ENCOUNTER — Ambulatory Visit
Admission: RE | Admit: 2023-08-27 | Discharge: 2023-08-27 | Disposition: A | Discharge status: Home / Self Care | Source: Ambulatory Visit | Attending: Obstetrics and Gynecology | Admitting: Obstetrics and Gynecology

## 2023-08-27 DIAGNOSIS — N6489 Other specified disorders of breast: Secondary | ICD-10-CM

## 2023-10-11 ENCOUNTER — Other Ambulatory Visit (HOSPITAL_COMMUNITY): Payer: Self-pay

## 2023-10-11 MED ORDER — TRINTELLIX 10 MG PO TABS
10.0000 mg | ORAL_TABLET | Freq: Every day | ORAL | 0 refills | Status: AC
  Filled 2023-10-11: qty 90, 90d supply, fill #0
  Filled 2023-10-26: qty 30, 30d supply, fill #0
  Filled 2024-06-09: qty 90, 90d supply, fill #0

## 2023-10-23 ENCOUNTER — Other Ambulatory Visit (HOSPITAL_COMMUNITY): Payer: Self-pay

## 2023-10-26 ENCOUNTER — Other Ambulatory Visit (HOSPITAL_COMMUNITY): Payer: Self-pay

## 2023-12-17 ENCOUNTER — Other Ambulatory Visit (HOSPITAL_COMMUNITY): Payer: Self-pay

## 2023-12-17 MED ORDER — ESCITALOPRAM OXALATE 10 MG PO TABS
10.0000 mg | ORAL_TABLET | Freq: Every morning | ORAL | 0 refills | Status: DC
  Filled 2023-12-17: qty 90, 90d supply, fill #0

## 2023-12-25 ENCOUNTER — Other Ambulatory Visit (HOSPITAL_COMMUNITY): Payer: Self-pay

## 2023-12-25 MED ORDER — SPIRONOLACTONE 100 MG PO TABS
100.0000 mg | ORAL_TABLET | Freq: Every day | ORAL | 1 refills | Status: DC
  Filled 2023-12-25: qty 90, 90d supply, fill #0
  Filled 2024-03-19: qty 90, 90d supply, fill #1

## 2024-02-07 ENCOUNTER — Ambulatory Visit: Admitting: Neurology

## 2024-03-19 ENCOUNTER — Other Ambulatory Visit: Payer: Self-pay

## 2024-03-19 ENCOUNTER — Other Ambulatory Visit (HOSPITAL_COMMUNITY): Payer: Self-pay

## 2024-03-19 MED ORDER — ESCITALOPRAM OXALATE 10 MG PO TABS
10.0000 mg | ORAL_TABLET | Freq: Every morning | ORAL | 2 refills | Status: DC
  Filled 2024-03-19: qty 90, 90d supply, fill #0

## 2024-05-16 ENCOUNTER — Other Ambulatory Visit (HOSPITAL_COMMUNITY): Payer: Self-pay

## 2024-05-16 MED ORDER — FERROUS SULFATE 325 (65 FE) MG PO TABS
325.0000 mg | ORAL_TABLET | ORAL | 0 refills | Status: AC
  Filled 2024-05-16: qty 12, 28d supply, fill #0
  Filled 2024-06-09: qty 12, 28d supply, fill #1

## 2024-05-16 MED ORDER — ERGOCALCIFEROL 1.25 MG (50000 UT) PO CAPS
50000.0000 [IU] | ORAL_CAPSULE | ORAL | 0 refills | Status: AC
  Filled 2024-05-16: qty 12, 84d supply, fill #0

## 2024-05-21 ENCOUNTER — Other Ambulatory Visit (HOSPITAL_COMMUNITY): Payer: Self-pay

## 2024-05-21 MED ORDER — ONDANSETRON 4 MG PO TBDP
4.0000 mg | ORAL_TABLET | Freq: Three times a day (TID) | ORAL | 0 refills | Status: DC | PRN
  Filled 2024-05-21: qty 20, 7d supply, fill #0

## 2024-05-21 MED ORDER — LOPERAMIDE HCL 2 MG PO CAPS
2.0000 mg | ORAL_CAPSULE | Freq: Four times a day (QID) | ORAL | 0 refills | Status: DC | PRN
  Filled 2024-05-21: qty 30, 8d supply, fill #0

## 2024-05-22 ENCOUNTER — Encounter: Payer: Self-pay | Admitting: Cardiovascular Disease

## 2024-05-22 ENCOUNTER — Ambulatory Visit: Admitting: Plastic Surgery

## 2024-05-22 VITALS — BP 106/73 | HR 81 | Ht 70.0 in | Wt 225.0 lb

## 2024-05-22 DIAGNOSIS — N6489 Other specified disorders of breast: Secondary | ICD-10-CM

## 2024-05-22 DIAGNOSIS — M546 Pain in thoracic spine: Secondary | ICD-10-CM

## 2024-05-22 DIAGNOSIS — M542 Cervicalgia: Secondary | ICD-10-CM

## 2024-05-22 DIAGNOSIS — Z9884 Bariatric surgery status: Secondary | ICD-10-CM | POA: Diagnosis not present

## 2024-05-22 DIAGNOSIS — N62 Hypertrophy of breast: Secondary | ICD-10-CM | POA: Diagnosis not present

## 2024-05-22 DIAGNOSIS — N6481 Ptosis of breast: Secondary | ICD-10-CM | POA: Diagnosis not present

## 2024-05-22 NOTE — Progress Notes (Signed)
 "   Referring Provider Lonnie Earnest, MD [address] [place],  [place] [address]   CC:  Chief Complaint  Patient presents with   Breast Problem      Rebecca Russo is an 49 y.o. female.  HPI: Rebecca Russo is a 49 year old female who presents today with complaints of upper back and neck pain of approximately 2 years duration which she feels are secondary to her large breast size.  She notes that is very difficult for her to find bras fit appropriately and she wears a size larger than normal for comfort.  She has had a 200 pound weight loss after bariatric surgery.  Allergies[1]  Outpatient Encounter Medications as of 05/22/2024  Medication Sig   Drospirenone (SLYND) 4 MG TABS Take 4 mg by mouth daily.   ergocalciferol  (VITAMIN D2) 1.25 MG (50000 UT) capsule Take 1 capsule (50,000 Units total) by mouth once a week for 12 weeks.   ferrous sulfate  325 (65 FE) MG tablet Take 1 tablet (325 mg total) by mouth 3 (three) times a week on Monday, Wednesday and Friday.   Multiple Vitamins-Minerals (MULTIVITAMIN WITH MINERALS) tablet Take 1 tablet by mouth in the morning.   vortioxetine  HBr (TRINTELLIX ) 10 MG TABS tablet Take 1 tablet (10 mg total) by mouth daily.   [DISCONTINUED] escitalopram  (LEXAPRO ) 10 MG tablet Take 1 tablet (10 mg total) by mouth every morning.   [DISCONTINUED] gabapentin  (NEURONTIN ) 100 MG capsule Take 2 capsules (200 mg total) by mouth every 12 (twelve) hours.   [DISCONTINUED] hyoscyamine  (LEVSIN ) 0.125 MG tablet Take 1 tablet (0.125 mg total) by mouth 3 (three) times daily before meals.   [DISCONTINUED] levonorgestrel  (MIRENA , 52 MG,) 20 MCG/24HR IUD 1 each by Intrauterine route once.   [DISCONTINUED] loperamide  (IMODIUM ) 2 MG capsule Take 1 capsule (2 mg total) by mouth 4 (four) times a day as needed for diarrhea.   [DISCONTINUED] methocarbamol  (ROBAXIN ) 500 MG tablet Take 1 tablet (500 mg total) by mouth 2 (two) times daily.   [DISCONTINUED] metoCLOPramide  (REGLAN ) 10 MG  tablet Take 1 tablet (10 mg total) by mouth 3 (three) times daily before meals.   [DISCONTINUED] ondansetron  (ZOFRAN -ODT) 4 MG disintegrating tablet Dissolve 1 tablet (4 mg total) on tongue every 8 (eight) hours as needed for nausea or vomiting.   [DISCONTINUED] pantoprazole  (PROTONIX ) 40 MG tablet Take 1 tablet (40 mg total) by mouth daily.   [DISCONTINUED] spironolactone  (ALDACTONE ) 100 MG tablet Take 1 tablet (100 mg total) by mouth daily.   [DISCONTINUED] thiamine  (VITAMIN B-1) 50 MG tablet Take 1 tablet (50 mg total) by mouth daily.   No facility-administered encounter medications on file as of 05/22/2024.     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   Anemia    Aphasia complicating stroke    Bilateral swelling of feet    Chronic systolic heart failure (HCC) 06/09/2016   a - Prob nonischemic //  Echo 10/17: EF 40-45, diffuse HK, mild LAE, PFO closure device without residual shunt  //  Could not tol cMRI // Echo 4/18: EF 55, normal wall motion, normal diastolic function, trivial MR, PFO device in place without residual shunt, PASP 24   Constipation    Depression    Eczema    GERD (gastroesophageal reflux disease)    H/O varicella    Hemiplegia affecting dominant side (HCC)    History of ischemic left MCA stroke    weakness on right side   Increased BMI 12/17/2008  LAE (left atrial enlargement) 04/05/2018   Mod, noted on ECHO   Migraines    Morbid obesity (HCC)    PFO with atrial septal aneurysm 06/04/2014   a - s/p transcatheter closure in 2/16 with 25 mm Amplatzer Cribiform ASO device // b - Echo 10/17:  EF 40-45, diffuse HK, mild LAE, PFO closure device without residual shunt    Stroke (cerebrum) (HCC)    Vitamin D  deficiency     Past Surgical History:  Procedure Laterality Date   CESAREAN SECTION  1999   COLON SURGERY  2004   partial colectomy   HIATAL HERNIA REPAIR N/A 12/21/2020   Procedure: HERNIA REPAIR HIATAL;  Surgeon:  Lyndel Deward PARAS, MD;  Location: [facility] ORS;  Service: General;  Laterality: N/A;   LAPAROSCOPIC CHOLECYSTECTOMY  06/20/2012   LAPAROSCOPIC GASTRIC SLEEVE RESECTION N/A 07/29/2018   Procedure: LAPAROSCOPIC GASTRIC SLEEVE RESECTION, UPPER ENDO, ERAS Pathway;  Surgeon: Mikell Katz, MD;  Location: [facility];  Service: General;  Laterality: N/A;   PATENT FORAMEN OVALE CLOSURE  07/09/2014   PATENT FORAMEN OVALE CLOSURE N/A 07/09/2014   Procedure: PATENT FORAMEN OVALE CLOSURE;  Surgeon: Ozell JONETTA Fell, MD;  [facility] C[facility] LAB;  Service: Cardiovascular;  Laterality: N/A;   RADIOLOGY WITH ANESTHESIA N/A 05/29/2014   Procedure: RADIOLOGY WITH ANESTHESIA;  Surgeon: Thyra MARLA Nash, MD;  Location: [facility] OR;  Service: Radiology;  Laterality: N/A;   TEE WITHOUT CARDIOVERSION N/A 06/03/2014   Procedure: TRANSESOPHAGEAL ECHOCARDIOGRAM (TEE);  Surgeon: Maude JAYSON Emmer, MD;  Location: [facility] ENDOSCOPY;  Service: Cardiovascular;  Laterality: N/A;    Family History  Problem Relation Age of Onset   Hypertension Mother    Diabetes Mother    Heart disease Mother    Sleep apnea Mother    Hypertension Father    Stroke Father    Diabetes Father    Heart disease Father    Stroke Paternal Grandfather    Stroke Maternal Grandfather    Cancer Other    Breast cancer Neg Hx    Colon cancer Neg Hx     Social History   Social History Narrative   Patient is single with 2 children --> 25 and 20, both in college. Daughter is at [employer] studying nursing. Son at [employer].   Patient is right handed.   Patient has her Associates degree.   Patient drinks 1 soda a month.   Worked in radiology scheduling     Review of Systems General: Denies fevers, chills, weight loss CV: Denies chest pain, shortness of breath, palpitations Breast: Patient feels that the large size of her breast is contributing to her upper back and neck pain  Physical Exam    05/22/2024   11:15 AM 06/14/2023   10:46 AM 05/15/2023    5:39 PM   Vitals with BMI  Height 5' 10 5' 10   Weight 225 lbs 222 lbs 10 oz   BMI 32.28 31.94   Systolic 106 108 895  Diastolic 73 70 70  Pulse 81 62 65    General:  No acute distress,  Alert and oriented, Non-Toxic, Normal speech and affect Breast: Patient has deflated ptotic breast bilaterally with grade 2 ptosis on the right and grade 3 ptosis on the left.  There is asymmetry of the breasts.  There are no dominant masses on physical examination and the nipples are normal in appearance.  There is no evidence of nipple discharge. Mammogram: April 2025 BI-RADS 1 Assessment/Plan Upper back and neck pain: On  evaluation the patient I believe that I could remove it most 200 g from the right and 300 g from the left.  Her Schnurr weight is 980 g.  We discussed this at length and the fact that it is unlikely that she would ever qualify for a breast reduction.  We discussed the possibility of a breast lift to reshape the breast and possibly help with wearing her bras.  She is interested in getting a quote for a mastopexy. I showed her the location of the incisions and we discussed the unpredictable nature of scarring and wound healing.  We discussed the risk of bleeding, infection, and seroma formation.  We discussed the risk of nipple loss which is higher with the reduction but not 0 with a mastopexy.  We discussed the postoperative course.  I will encourage her to begin ambulating the day of surgery to help decrease the risk of DVT and she may return to light activity as tolerated.  Postoperative restrictions include no heavy lifting greater than 20 pounds, no vigorous activity, no submerging incisions in water  for 6 weeks.  She does understand that she will need to delay her next mammogram approximately 6 months after surgery.  All questions were answered to her satisfaction.  Photographs were obtained today with her consent.  Will send her a quote for a mastopexy and schedule if she is interested.  Leonce KATHEE [place] 05/22/2024, 11:56 AM         [1] No Known Allergies  "

## 2024-06-09 ENCOUNTER — Other Ambulatory Visit: Payer: Self-pay

## 2024-06-09 ENCOUNTER — Other Ambulatory Visit (HOSPITAL_COMMUNITY): Payer: Self-pay

## 2024-06-10 ENCOUNTER — Other Ambulatory Visit: Payer: Self-pay

## 2024-06-10 ENCOUNTER — Other Ambulatory Visit (HOSPITAL_COMMUNITY): Payer: Self-pay

## 2024-06-10 MED ORDER — SLYND 4 MG PO TABS
1.0000 | ORAL_TABLET | Freq: Every day | ORAL | 3 refills | Status: AC
  Filled 2024-06-10: qty 84, 84d supply, fill #0

## 2024-06-10 MED ORDER — NORETHINDRONE 0.35 MG PO TABS
1.0000 | ORAL_TABLET | Freq: Every day | ORAL | 3 refills | Status: AC
  Filled 2024-06-10 (×3): qty 84, 84d supply, fill #0

## 2024-06-10 MED FILL — Drospirenone Tab 4 MG: ORAL | 28 days supply | Qty: 28 | Fill #0 | Status: CN
